# Patient Record
Sex: Male | Born: 1960
Health system: Southern US, Community
[De-identification: ages and names within clinical notes are randomized; demographics above are authoritative.]

## PROBLEM LIST (undated history)

## (undated) DIAGNOSIS — I499 Cardiac arrhythmia, unspecified: Secondary | ICD-10-CM

## (undated) DIAGNOSIS — G629 Polyneuropathy, unspecified: Secondary | ICD-10-CM

## (undated) DIAGNOSIS — G4730 Sleep apnea, unspecified: Secondary | ICD-10-CM

## (undated) DIAGNOSIS — C801 Malignant (primary) neoplasm, unspecified: Secondary | ICD-10-CM

## (undated) DIAGNOSIS — E669 Obesity, unspecified: Secondary | ICD-10-CM

## (undated) DIAGNOSIS — M1990 Unspecified osteoarthritis, unspecified site: Secondary | ICD-10-CM

## (undated) DIAGNOSIS — K429 Umbilical hernia without obstruction or gangrene: Secondary | ICD-10-CM

## (undated) DIAGNOSIS — I82409 Acute embolism and thrombosis of unspecified deep veins of unspecified lower extremity: Secondary | ICD-10-CM

## (undated) DIAGNOSIS — K633 Ulcer of intestine: Secondary | ICD-10-CM

## (undated) DIAGNOSIS — Z85038 Personal history of other malignant neoplasm of large intestine: Secondary | ICD-10-CM

## (undated) DIAGNOSIS — E785 Hyperlipidemia, unspecified: Secondary | ICD-10-CM

## (undated) DIAGNOSIS — Z95810 Presence of automatic (implantable) cardiac defibrillator: Secondary | ICD-10-CM

## (undated) DIAGNOSIS — N189 Chronic kidney disease, unspecified: Secondary | ICD-10-CM

## (undated) DIAGNOSIS — I10 Essential (primary) hypertension: Secondary | ICD-10-CM

## (undated) HISTORY — DX: Obesity, unspecified: E66.9

## (undated) HISTORY — PX: CARDIAC DEFIBRILLATOR PLACEMENT: SHX171

## (undated) HISTORY — DX: Hyperlipidemia, unspecified: E78.5

## (undated) HISTORY — DX: Personal history of other malignant neoplasm of large intestine: Z85.038

## (undated) HISTORY — DX: Cardiac arrhythmia, unspecified: I49.9

## (undated) HISTORY — DX: Essential (primary) hypertension: I10

## (undated) HISTORY — DX: Polyneuropathy, unspecified: G62.9

## (undated) HISTORY — PX: COLONOSCOPY: SHX174

## (undated) HISTORY — DX: Ulcer of intestine: K63.3

## (undated) HISTORY — DX: Chronic kidney disease, unspecified: N18.9

## (undated) HISTORY — PX: HERNIA REPAIR: SHX51

## (undated) HISTORY — PX: LAPAROSCOPIC GASTRIC SLEEVE RESECTION: SHX5895

## (undated) HISTORY — DX: Acute embolism and thrombosis of unspecified deep veins of unspecified lower extremity: I82.409

## (undated) HISTORY — DX: Sleep apnea, unspecified: G47.30

## (undated) HISTORY — PX: CHOLECYSTECTOMY: SHX55

---

## 2002-12-17 DIAGNOSIS — I472 Ventricular tachycardia, unspecified: Secondary | ICD-10-CM | POA: Insufficient documentation

## 2002-12-17 DIAGNOSIS — I499 Cardiac arrhythmia, unspecified: Secondary | ICD-10-CM

## 2002-12-17 HISTORY — DX: Cardiac arrhythmia, unspecified: I49.9

## 2002-12-27 ENCOUNTER — Inpatient Hospital Stay (HOSPITAL_COMMUNITY): Admission: EM | Admit: 2002-12-27 | Discharge: 2003-02-18 | Payer: Self-pay | Admitting: Emergency Medicine

## 2002-12-29 ENCOUNTER — Encounter: Payer: Self-pay | Admitting: Cardiology

## 2003-01-22 ENCOUNTER — Encounter (INDEPENDENT_AMBULATORY_CARE_PROVIDER_SITE_OTHER): Payer: Self-pay | Admitting: Cardiology

## 2003-02-03 ENCOUNTER — Encounter: Payer: Self-pay | Admitting: Cardiology

## 2003-02-18 ENCOUNTER — Inpatient Hospital Stay
Admission: RE | Admit: 2003-02-18 | Discharge: 2003-02-28 | Payer: Self-pay | Admitting: Physical Medicine & Rehabilitation

## 2003-02-20 ENCOUNTER — Encounter (HOSPITAL_COMMUNITY): Admission: RE | Admit: 2003-02-20 | Discharge: 2003-02-28 | Payer: Self-pay | Admitting: Pulmonary Disease

## 2003-02-27 ENCOUNTER — Ambulatory Visit (HOSPITAL_COMMUNITY)
Admission: RE | Admit: 2003-02-27 | Discharge: 2003-02-27 | Payer: Self-pay | Admitting: Physical Medicine & Rehabilitation

## 2003-03-03 ENCOUNTER — Ambulatory Visit (HOSPITAL_COMMUNITY): Admission: RE | Admit: 2003-03-03 | Discharge: 2003-03-03 | Payer: Self-pay | Admitting: Pulmonary Disease

## 2003-03-16 ENCOUNTER — Ambulatory Visit (HOSPITAL_BASED_OUTPATIENT_CLINIC_OR_DEPARTMENT_OTHER): Admission: RE | Admit: 2003-03-16 | Discharge: 2003-03-16 | Payer: Self-pay | Admitting: Pulmonary Disease

## 2003-03-16 ENCOUNTER — Encounter: Payer: Self-pay | Admitting: Internal Medicine

## 2003-12-08 ENCOUNTER — Ambulatory Visit: Payer: Self-pay | Admitting: Internal Medicine

## 2003-12-14 ENCOUNTER — Ambulatory Visit: Payer: Self-pay | Admitting: Internal Medicine

## 2004-01-06 ENCOUNTER — Ambulatory Visit: Payer: Self-pay | Admitting: Internal Medicine

## 2004-02-11 ENCOUNTER — Ambulatory Visit: Payer: Self-pay | Admitting: Internal Medicine

## 2004-03-10 ENCOUNTER — Ambulatory Visit: Payer: Self-pay | Admitting: Internal Medicine

## 2004-04-07 ENCOUNTER — Ambulatory Visit: Payer: Self-pay | Admitting: Internal Medicine

## 2004-04-12 ENCOUNTER — Ambulatory Visit: Payer: Self-pay | Admitting: Internal Medicine

## 2004-05-05 ENCOUNTER — Ambulatory Visit: Payer: Self-pay | Admitting: Internal Medicine

## 2004-05-30 ENCOUNTER — Ambulatory Visit: Payer: Self-pay | Admitting: Internal Medicine

## 2004-06-01 ENCOUNTER — Ambulatory Visit: Payer: Self-pay | Admitting: Internal Medicine

## 2004-07-04 ENCOUNTER — Ambulatory Visit: Payer: Self-pay | Admitting: Internal Medicine

## 2004-08-01 ENCOUNTER — Ambulatory Visit: Payer: Self-pay | Admitting: Internal Medicine

## 2004-08-29 ENCOUNTER — Ambulatory Visit: Payer: Self-pay | Admitting: Internal Medicine

## 2004-09-26 ENCOUNTER — Ambulatory Visit: Payer: Self-pay | Admitting: Internal Medicine

## 2004-10-10 ENCOUNTER — Ambulatory Visit: Payer: Self-pay | Admitting: Internal Medicine

## 2004-10-26 ENCOUNTER — Ambulatory Visit: Payer: Self-pay | Admitting: Internal Medicine

## 2004-11-23 ENCOUNTER — Ambulatory Visit: Payer: Self-pay | Admitting: Internal Medicine

## 2004-12-14 ENCOUNTER — Ambulatory Visit: Payer: Self-pay | Admitting: Internal Medicine

## 2004-12-26 ENCOUNTER — Ambulatory Visit: Payer: Self-pay | Admitting: Internal Medicine

## 2004-12-29 ENCOUNTER — Ambulatory Visit: Payer: Self-pay | Admitting: Internal Medicine

## 2005-01-19 ENCOUNTER — Ambulatory Visit: Payer: Self-pay | Admitting: Internal Medicine

## 2005-01-25 ENCOUNTER — Ambulatory Visit: Payer: Self-pay | Admitting: Internal Medicine

## 2005-01-26 ENCOUNTER — Ambulatory Visit: Payer: Self-pay | Admitting: Family Medicine

## 2005-02-03 ENCOUNTER — Ambulatory Visit: Payer: Self-pay | Admitting: Family Medicine

## 2005-02-16 ENCOUNTER — Ambulatory Visit: Payer: Self-pay | Admitting: Internal Medicine

## 2005-02-22 ENCOUNTER — Ambulatory Visit: Payer: Self-pay | Admitting: Internal Medicine

## 2005-03-22 ENCOUNTER — Ambulatory Visit: Payer: Self-pay | Admitting: Internal Medicine

## 2005-04-20 ENCOUNTER — Ambulatory Visit: Payer: Self-pay | Admitting: Internal Medicine

## 2005-05-02 ENCOUNTER — Ambulatory Visit: Payer: Self-pay | Admitting: Internal Medicine

## 2005-05-17 ENCOUNTER — Ambulatory Visit: Payer: Self-pay | Admitting: Internal Medicine

## 2005-06-08 ENCOUNTER — Ambulatory Visit: Payer: Self-pay | Admitting: Internal Medicine

## 2005-06-14 ENCOUNTER — Ambulatory Visit: Payer: Self-pay | Admitting: Internal Medicine

## 2005-07-12 ENCOUNTER — Ambulatory Visit: Payer: Self-pay | Admitting: Internal Medicine

## 2005-08-09 ENCOUNTER — Ambulatory Visit: Payer: Self-pay | Admitting: Internal Medicine

## 2005-09-06 ENCOUNTER — Ambulatory Visit: Payer: Self-pay | Admitting: Internal Medicine

## 2005-10-09 ENCOUNTER — Ambulatory Visit: Payer: Self-pay | Admitting: Family Medicine

## 2005-10-12 ENCOUNTER — Ambulatory Visit: Payer: Self-pay

## 2005-10-22 ENCOUNTER — Emergency Department (HOSPITAL_COMMUNITY): Admission: EM | Admit: 2005-10-22 | Discharge: 2005-10-23 | Payer: Self-pay | Admitting: Emergency Medicine

## 2005-10-23 ENCOUNTER — Encounter: Payer: Self-pay | Admitting: Internal Medicine

## 2005-10-23 ENCOUNTER — Ambulatory Visit (HOSPITAL_COMMUNITY): Admission: RE | Admit: 2005-10-23 | Discharge: 2005-10-23 | Payer: Self-pay | Admitting: Emergency Medicine

## 2005-11-08 ENCOUNTER — Ambulatory Visit: Payer: Self-pay | Admitting: Internal Medicine

## 2005-11-24 ENCOUNTER — Encounter (INDEPENDENT_AMBULATORY_CARE_PROVIDER_SITE_OTHER): Payer: Self-pay | Admitting: Specialist

## 2005-11-24 ENCOUNTER — Ambulatory Visit (HOSPITAL_COMMUNITY): Admission: RE | Admit: 2005-11-24 | Discharge: 2005-11-25 | Payer: Self-pay | Admitting: General Surgery

## 2005-12-11 ENCOUNTER — Ambulatory Visit: Payer: Self-pay | Admitting: Internal Medicine

## 2006-01-02 ENCOUNTER — Ambulatory Visit: Payer: Self-pay | Admitting: Internal Medicine

## 2006-01-17 ENCOUNTER — Ambulatory Visit: Payer: Self-pay | Admitting: Internal Medicine

## 2006-01-31 ENCOUNTER — Ambulatory Visit: Payer: Self-pay | Admitting: Internal Medicine

## 2006-03-01 ENCOUNTER — Ambulatory Visit: Payer: Self-pay | Admitting: Internal Medicine

## 2006-03-01 LAB — CONVERTED CEMR LAB
ALT: 57 units/L — ABNORMAL HIGH (ref 0–40)
AST: 39 units/L — ABNORMAL HIGH (ref 0–37)
Bilirubin, Direct: 0.1 mg/dL (ref 0.0–0.3)
Total Bilirubin: 0.9 mg/dL (ref 0.3–1.2)

## 2006-03-28 ENCOUNTER — Ambulatory Visit: Payer: Self-pay | Admitting: Internal Medicine

## 2006-04-15 ENCOUNTER — Ambulatory Visit: Payer: Self-pay | Admitting: Internal Medicine

## 2006-04-25 ENCOUNTER — Ambulatory Visit: Payer: Self-pay | Admitting: Internal Medicine

## 2006-05-23 ENCOUNTER — Ambulatory Visit: Payer: Self-pay | Admitting: Internal Medicine

## 2006-05-23 DIAGNOSIS — I4891 Unspecified atrial fibrillation: Secondary | ICD-10-CM | POA: Insufficient documentation

## 2006-05-23 LAB — CONVERTED CEMR LAB

## 2006-06-19 ENCOUNTER — Ambulatory Visit: Payer: Self-pay | Admitting: Internal Medicine

## 2006-06-19 DIAGNOSIS — I10 Essential (primary) hypertension: Secondary | ICD-10-CM | POA: Insufficient documentation

## 2006-06-19 DIAGNOSIS — Z6841 Body Mass Index (BMI) 40.0 and over, adult: Secondary | ICD-10-CM

## 2006-06-19 DIAGNOSIS — G4710 Hypersomnia, unspecified: Secondary | ICD-10-CM | POA: Insufficient documentation

## 2006-06-19 DIAGNOSIS — G4730 Sleep apnea, unspecified: Secondary | ICD-10-CM

## 2006-06-19 DIAGNOSIS — E785 Hyperlipidemia, unspecified: Secondary | ICD-10-CM | POA: Insufficient documentation

## 2006-07-10 ENCOUNTER — Ambulatory Visit: Payer: Self-pay | Admitting: Internal Medicine

## 2006-07-16 ENCOUNTER — Ambulatory Visit: Payer: Self-pay | Admitting: Internal Medicine

## 2006-07-16 LAB — CONVERTED CEMR LAB
INR: 2
Prothrombin Time: 17.5 s

## 2006-07-18 ENCOUNTER — Telehealth (INDEPENDENT_AMBULATORY_CARE_PROVIDER_SITE_OTHER): Payer: Self-pay | Admitting: *Deleted

## 2006-07-24 ENCOUNTER — Encounter: Payer: Self-pay | Admitting: Internal Medicine

## 2006-08-13 ENCOUNTER — Ambulatory Visit: Payer: Self-pay | Admitting: Internal Medicine

## 2006-08-13 LAB — CONVERTED CEMR LAB: Prothrombin Time: 18.2 s

## 2006-09-12 ENCOUNTER — Ambulatory Visit: Payer: Self-pay | Admitting: Internal Medicine

## 2006-09-26 ENCOUNTER — Ambulatory Visit: Payer: Self-pay | Admitting: Internal Medicine

## 2006-09-26 LAB — CONVERTED CEMR LAB: Prothrombin Time: 17.3 s

## 2006-10-16 ENCOUNTER — Ambulatory Visit: Payer: Self-pay | Admitting: Internal Medicine

## 2006-10-19 ENCOUNTER — Ambulatory Visit: Payer: Self-pay | Admitting: Internal Medicine

## 2006-10-19 LAB — CONVERTED CEMR LAB: INR: 1.5

## 2006-11-05 ENCOUNTER — Ambulatory Visit: Payer: Self-pay | Admitting: Internal Medicine

## 2006-11-05 LAB — CONVERTED CEMR LAB: Prothrombin Time: 17.4 s

## 2006-12-04 ENCOUNTER — Ambulatory Visit: Payer: Self-pay | Admitting: Internal Medicine

## 2006-12-05 LAB — CONVERTED CEMR LAB
INR: 2
Prothrombin Time: 17.5 s

## 2006-12-19 ENCOUNTER — Encounter: Payer: Self-pay | Admitting: Internal Medicine

## 2006-12-20 ENCOUNTER — Ambulatory Visit (HOSPITAL_COMMUNITY): Admission: RE | Admit: 2006-12-20 | Discharge: 2006-12-20 | Payer: Self-pay | Admitting: Surgery

## 2006-12-25 ENCOUNTER — Ambulatory Visit (HOSPITAL_COMMUNITY): Admission: RE | Admit: 2006-12-25 | Discharge: 2006-12-25 | Payer: Self-pay | Admitting: Surgery

## 2006-12-27 ENCOUNTER — Encounter: Admission: RE | Admit: 2006-12-27 | Discharge: 2006-12-28 | Payer: Self-pay | Admitting: Surgery

## 2007-01-01 ENCOUNTER — Ambulatory Visit: Payer: Self-pay | Admitting: Family Medicine

## 2007-01-01 ENCOUNTER — Encounter (INDEPENDENT_AMBULATORY_CARE_PROVIDER_SITE_OTHER): Payer: Self-pay | Admitting: Internal Medicine

## 2007-01-01 LAB — CONVERTED CEMR LAB: INR: 2.2

## 2007-01-02 ENCOUNTER — Ambulatory Visit: Payer: Self-pay | Admitting: Internal Medicine

## 2007-01-03 ENCOUNTER — Encounter: Payer: Self-pay | Admitting: Internal Medicine

## 2007-01-03 ENCOUNTER — Ambulatory Visit: Payer: Self-pay | Admitting: Internal Medicine

## 2007-01-03 LAB — CONVERTED CEMR LAB
Basophils Relative: 0.2 % (ref 0.0–1.0)
Eosinophils Relative: 3.7 % (ref 0.0–5.0)
HCT: 44.9 % (ref 39.0–52.0)
Hemoglobin: 15.7 g/dL (ref 13.0–17.0)
Lymphocytes Relative: 23.1 % (ref 12.0–46.0)
MCV: 87.6 fL (ref 78.0–100.0)
Monocytes Absolute: 0.7 10*3/uL (ref 0.2–0.7)
Neutro Abs: 5.9 10*3/uL (ref 1.4–7.7)
Neutrophils Relative %: 65.1 % (ref 43.0–77.0)
WBC: 9 10*3/uL (ref 4.5–10.5)

## 2007-01-04 ENCOUNTER — Encounter: Payer: Self-pay | Admitting: Internal Medicine

## 2007-01-14 ENCOUNTER — Telehealth (INDEPENDENT_AMBULATORY_CARE_PROVIDER_SITE_OTHER): Payer: Self-pay | Admitting: *Deleted

## 2007-01-15 ENCOUNTER — Ambulatory Visit: Payer: Self-pay | Admitting: Internal Medicine

## 2007-01-17 DIAGNOSIS — Z85038 Personal history of other malignant neoplasm of large intestine: Secondary | ICD-10-CM | POA: Insufficient documentation

## 2007-01-17 HISTORY — DX: Personal history of other malignant neoplasm of large intestine: Z85.038

## 2007-01-17 HISTORY — PX: COLECTOMY: SHX59

## 2007-01-21 ENCOUNTER — Encounter: Payer: Self-pay | Admitting: Internal Medicine

## 2007-01-21 ENCOUNTER — Encounter (INDEPENDENT_AMBULATORY_CARE_PROVIDER_SITE_OTHER): Payer: Self-pay | Admitting: Internal Medicine

## 2007-01-21 ENCOUNTER — Ambulatory Visit (HOSPITAL_COMMUNITY): Admission: RE | Admit: 2007-01-21 | Discharge: 2007-01-21 | Payer: Self-pay | Admitting: Internal Medicine

## 2007-01-22 ENCOUNTER — Ambulatory Visit: Payer: Self-pay | Admitting: Internal Medicine

## 2007-01-22 ENCOUNTER — Ambulatory Visit: Payer: Self-pay | Admitting: Cardiology

## 2007-01-22 LAB — CONVERTED CEMR LAB
ALT: 73 units/L — ABNORMAL HIGH (ref 0–53)
Albumin: 3.6 g/dL (ref 3.5–5.2)
Basophils Absolute: 0.2 10*3/uL — ABNORMAL HIGH (ref 0.0–0.1)
Chloride: 102 meq/L (ref 96–112)
Eosinophils Absolute: 0.3 10*3/uL (ref 0.0–0.6)
Glucose, Bld: 276 mg/dL — ABNORMAL HIGH (ref 70–99)
HCT: 46.6 % (ref 39.0–52.0)
MCHC: 34.2 g/dL (ref 30.0–36.0)
MCV: 88.7 fL (ref 78.0–100.0)
Monocytes Relative: 4.2 % (ref 3.0–11.0)
Neutrophils Relative %: 73.8 % (ref 43.0–77.0)
Platelets: 154 10*3/uL (ref 150–400)
Potassium: 3.9 meq/L (ref 3.5–5.1)
RBC: 5.25 M/uL (ref 4.22–5.81)
Sodium: 135 meq/L (ref 135–145)
Total Bilirubin: 0.9 mg/dL (ref 0.3–1.2)

## 2007-01-24 ENCOUNTER — Ambulatory Visit: Payer: Self-pay | Admitting: Internal Medicine

## 2007-01-25 ENCOUNTER — Ambulatory Visit: Payer: Self-pay | Admitting: Internal Medicine

## 2007-01-25 ENCOUNTER — Encounter: Payer: Self-pay | Admitting: Internal Medicine

## 2007-01-31 ENCOUNTER — Ambulatory Visit: Payer: Self-pay | Admitting: Internal Medicine

## 2007-01-31 LAB — CONVERTED CEMR LAB
Glucose, Urine, Semiquant: NEGATIVE
INR: 1.8
Nitrite: NEGATIVE
Protein, U semiquant: 100
Prothrombin Time: 16.3 s

## 2007-02-13 ENCOUNTER — Encounter (INDEPENDENT_AMBULATORY_CARE_PROVIDER_SITE_OTHER): Payer: Self-pay | Admitting: Surgery

## 2007-02-13 ENCOUNTER — Inpatient Hospital Stay (HOSPITAL_COMMUNITY): Admission: RE | Admit: 2007-02-13 | Discharge: 2007-02-19 | Payer: Self-pay | Admitting: Surgery

## 2007-02-13 ENCOUNTER — Encounter: Payer: Self-pay | Admitting: Internal Medicine

## 2007-02-13 ENCOUNTER — Ambulatory Visit: Payer: Self-pay | Admitting: Cardiology

## 2007-02-13 ENCOUNTER — Ambulatory Visit: Payer: Self-pay | Admitting: Oncology

## 2007-02-20 ENCOUNTER — Ambulatory Visit: Payer: Self-pay | Admitting: Oncology

## 2007-03-07 ENCOUNTER — Ambulatory Visit: Payer: Self-pay | Admitting: Internal Medicine

## 2007-03-08 LAB — CONVERTED CEMR LAB
Albumin: 3.4 g/dL — ABNORMAL LOW (ref 3.5–5.2)
Albumin: 3.6 g/dL (ref 3.5–5.2)
BUN: 19 mg/dL (ref 6–23)
Basophils Absolute: 0 10*3/uL (ref 0.0–0.1)
CO2: 27 meq/L (ref 19–32)
Calcium: 9.4 mg/dL (ref 8.4–10.5)
Calcium: 8.8 mg/dL (ref 8.4–10.5)
Chloride: 105 meq/L (ref 96–112)
Creatinine, Ser: 1.2 mg/dL (ref 0.4–1.5)
Creatinine, Ser: 1.1 mg/dL (ref 0.4–1.5)
GFR calc Af Amer: 84 mL/min
GFR calc Af Amer: 93 mL/min
GFR calc non Af Amer: 69 mL/min
GFR calc non Af Amer: 77 mL/min
Glucose, Bld: 148 mg/dL — ABNORMAL HIGH (ref 70–99)
Glucose, Bld: 203 mg/dL — ABNORMAL HIGH (ref 70–99)
HDL: 28.6 mg/dL — ABNORMAL LOW (ref >39.0)
Hemoglobin: 15.9 g/dL (ref 13.0–17.0)
Lymphocytes Relative: 19.9 % (ref 12.0–46.0)
MCHC: 34.5 g/dL (ref 30.0–36.0)
MCV: 88.1 fL (ref 78.0–100.0)
Monocytes Absolute: 0.5 10*3/uL (ref 0.2–0.7)
Monocytes Relative: 6.4 % (ref 3.0–11.0)
Neutro Abs: 5.9 10*3/uL (ref 1.4–7.7)
Phosphorus: 1.9 mg/dL — ABNORMAL LOW (ref 2.3–4.6)
Sodium: 135 meq/L (ref 135–145)
TSH: 1.28 microintl units/mL (ref 0.35–5.50)
Total Bilirubin: 0.8 mg/dL (ref 0.3–1.2)
Triglycerides: 242 mg/dL (ref 0–149)

## 2007-03-11 LAB — CONVERTED CEMR LAB
Albumin: 3.6 g/dL (ref 3.5–5.2)
Alkaline Phosphatase: 69 units/L (ref 39–117)
Basophils Relative: 0.4 % (ref 0.0–1.0)
CO2: 27 meq/L (ref 19–32)
Calcium: 8.8 mg/dL (ref 8.4–10.5)
Chloride: 102 meq/L (ref 96–112)
Cholesterol: 204 mg/dL (ref 0–200)
Creatinine, Ser: 1.1 mg/dL (ref 0.4–1.5)
Direct LDL: 125.9 mg/dL
Eosinophils Absolute: 0.2 10*3/uL (ref 0.0–0.6)
Glucose, Bld: 203 mg/dL — ABNORMAL HIGH (ref 70–99)
HDL: 28.6 mg/dL — ABNORMAL LOW (ref >39.0)
Lymphocytes Relative: 19.9 % (ref 12.0–46.0)
MCV: 88.1 fL (ref 78.0–100.0)
Monocytes Relative: 6.4 % (ref 3.0–11.0)
Neutro Abs: 5.9 10*3/uL (ref 1.4–7.7)
Platelets: 193 10*3/uL (ref 150–400)
RBC: 5.22 M/uL (ref 4.22–5.81)
TSH: 1.28 microintl units/mL (ref 0.35–5.50)
Triglycerides: 242 mg/dL (ref 0–149)
VLDL: 48 mg/dL — ABNORMAL HIGH (ref 0–40)

## 2007-03-19 ENCOUNTER — Encounter: Payer: Self-pay | Admitting: Internal Medicine

## 2007-03-21 ENCOUNTER — Ambulatory Visit (HOSPITAL_COMMUNITY): Admission: RE | Admit: 2007-03-21 | Discharge: 2007-03-21 | Payer: Self-pay | Admitting: Surgery

## 2007-03-21 ENCOUNTER — Telehealth (INDEPENDENT_AMBULATORY_CARE_PROVIDER_SITE_OTHER): Payer: Self-pay | Admitting: *Deleted

## 2007-03-21 ENCOUNTER — Encounter: Payer: Self-pay | Admitting: Internal Medicine

## 2007-03-25 ENCOUNTER — Encounter: Payer: Self-pay | Admitting: Internal Medicine

## 2007-04-02 LAB — PROTIME-INR
INR: 2.1 (ref 2.00–3.50)
Protime: 25.2 Seconds — ABNORMAL HIGH (ref 10.6–13.4)

## 2007-04-04 ENCOUNTER — Ambulatory Visit: Payer: Self-pay | Admitting: Oncology

## 2007-04-11 ENCOUNTER — Ambulatory Visit (HOSPITAL_COMMUNITY): Admission: RE | Admit: 2007-04-11 | Discharge: 2007-04-11 | Payer: Self-pay | Admitting: Oncology

## 2007-04-16 ENCOUNTER — Encounter: Payer: Self-pay | Admitting: Internal Medicine

## 2007-04-18 LAB — CBC WITH DIFFERENTIAL/PLATELET
BASO%: 0.7 % (ref 0.0–2.0)
LYMPH%: 18.1 % (ref 14.0–48.0)
MCHC: 32.8 g/dL (ref 32.0–35.9)
MONO#: 1 10*3/uL — ABNORMAL HIGH (ref 0.1–0.9)
Platelets: 208 10*3/uL (ref 145–400)
RBC: 5.4 10*6/uL (ref 4.20–5.71)
WBC: 10.5 10*3/uL — ABNORMAL HIGH (ref 4.0–10.0)
lymph#: 1.9 10*3/uL (ref 0.9–3.3)

## 2007-04-18 LAB — PROTIME-INR: Protime: 26.4 Seconds — ABNORMAL HIGH (ref 10.6–13.4)

## 2007-04-19 ENCOUNTER — Ambulatory Visit: Payer: Self-pay | Admitting: Oncology

## 2007-04-25 LAB — PROTIME-INR (CHCC SATELLITE): Protime: 26.4 Seconds — ABNORMAL HIGH (ref 10.6–13.4)

## 2007-04-30 ENCOUNTER — Encounter: Payer: Self-pay | Admitting: Internal Medicine

## 2007-04-30 LAB — COMPREHENSIVE METABOLIC PANEL
Alkaline Phosphatase: 70 U/L (ref 39–117)
BUN: 18 mg/dL (ref 6–23)
Creatinine, Ser: 1.19 mg/dL (ref 0.40–1.50)
Glucose, Bld: 193 mg/dL — ABNORMAL HIGH (ref 70–99)
Total Bilirubin: 0.5 mg/dL (ref 0.3–1.2)

## 2007-04-30 LAB — CBC WITH DIFFERENTIAL/PLATELET
Basophils Absolute: 0 10*3/uL (ref 0.0–0.1)
Eosinophils Absolute: 0.1 10*3/uL (ref 0.0–0.5)
HGB: 15.5 g/dL (ref 13.0–17.1)
LYMPH%: 22.3 % (ref 14.0–48.0)
MCV: 83.3 fL (ref 81.6–98.0)
MONO%: 10.2 % (ref 0.0–13.0)
NEUT#: 6.1 10*3/uL (ref 1.5–6.5)
NEUT%: 65.5 % (ref 40.0–75.0)
Platelets: 156 10*3/uL (ref 145–400)
RBC: 5.44 10*6/uL (ref 4.20–5.71)

## 2007-05-14 ENCOUNTER — Encounter: Payer: Self-pay | Admitting: Internal Medicine

## 2007-05-14 LAB — CBC WITH DIFFERENTIAL (CANCER CENTER ONLY)
BASO%: 0.4 % (ref 0.0–2.0)
EOS%: 2.8 % (ref 0.0–7.0)
MCH: 27.3 pg — ABNORMAL LOW (ref 28.0–33.4)
MCHC: 32.5 g/dL (ref 32.0–35.9)
MONO%: 8.1 % (ref 0.0–13.0)
NEUT#: 4.3 10*3/uL (ref 1.5–6.5)
Platelets: 112 10*3/uL — ABNORMAL LOW (ref 145–400)
RDW: 15.5 % — ABNORMAL HIGH (ref 10.5–14.6)

## 2007-05-14 LAB — COMPREHENSIVE METABOLIC PANEL
Albumin: 3.7 g/dL (ref 3.5–5.2)
BUN: 14 mg/dL (ref 6–23)
Calcium: 8.6 mg/dL (ref 8.4–10.5)
Chloride: 99 mEq/L (ref 96–112)
Glucose, Bld: 309 mg/dL — ABNORMAL HIGH (ref 70–99)
Potassium: 4.4 mEq/L (ref 3.5–5.3)
Total Protein: 6.9 g/dL (ref 6.0–8.3)

## 2007-05-14 LAB — PROTIME-INR (CHCC SATELLITE): Protime: 24 Seconds — ABNORMAL HIGH (ref 10.6–13.4)

## 2007-05-17 ENCOUNTER — Ambulatory Visit: Payer: Self-pay | Admitting: Internal Medicine

## 2007-05-23 ENCOUNTER — Ambulatory Visit: Payer: Self-pay | Admitting: Oncology

## 2007-05-27 ENCOUNTER — Encounter: Payer: Self-pay | Admitting: Internal Medicine

## 2007-05-27 LAB — COMPREHENSIVE METABOLIC PANEL
ALT: 42 U/L (ref 0–53)
Albumin: 3.9 g/dL (ref 3.5–5.2)
CO2: 24 mEq/L (ref 19–32)
Calcium: 9.1 mg/dL (ref 8.4–10.5)
Chloride: 98 mEq/L (ref 96–112)
Potassium: 4.4 mEq/L (ref 3.5–5.3)
Sodium: 135 mEq/L (ref 135–145)
Total Protein: 6.9 g/dL (ref 6.0–8.3)

## 2007-05-27 LAB — CBC WITH DIFFERENTIAL/PLATELET
BASO%: 0.3 % (ref 0.0–2.0)
Basophils Absolute: 0 10*3/uL (ref 0.0–0.1)
EOS%: 2 % (ref 0.0–7.0)
MCH: 29.1 pg (ref 28.0–33.4)
MCHC: 34.3 g/dL (ref 32.0–35.9)
MCV: 84.9 fL (ref 81.6–98.0)
MONO%: 10.8 % (ref 0.0–13.0)
NEUT%: 65 % (ref 40.0–75.0)
RDW: 19.1 % — ABNORMAL HIGH (ref 11.2–14.6)
lymph#: 1.8 10*3/uL (ref 0.9–3.3)

## 2007-05-27 LAB — PROTIME-INR

## 2007-05-30 ENCOUNTER — Ambulatory Visit: Payer: Self-pay | Admitting: Oncology

## 2007-06-17 ENCOUNTER — Encounter: Payer: Self-pay | Admitting: Internal Medicine

## 2007-06-17 LAB — COMPREHENSIVE METABOLIC PANEL
ALT: 34 U/L (ref 0–53)
AST: 20 U/L (ref 0–37)
CO2: 22 mEq/L (ref 19–32)
Calcium: 8.9 mg/dL (ref 8.4–10.5)
Chloride: 101 mEq/L (ref 96–112)
Sodium: 136 mEq/L (ref 135–145)
Total Bilirubin: 0.5 mg/dL (ref 0.3–1.2)
Total Protein: 7.1 g/dL (ref 6.0–8.3)

## 2007-06-17 LAB — PROTIME-INR

## 2007-06-17 LAB — CBC WITH DIFFERENTIAL/PLATELET
Basophils Absolute: 0.1 10*3/uL (ref 0.0–0.1)
HCT: 44.6 % (ref 38.7–49.9)
HGB: 15.3 g/dL (ref 13.0–17.1)
MONO#: 0.7 10*3/uL (ref 0.1–0.9)
NEUT#: 5.2 10*3/uL (ref 1.5–6.5)
NEUT%: 67.9 % (ref 40.0–75.0)
WBC: 7.7 10*3/uL (ref 4.0–10.0)
lymph#: 1.6 10*3/uL (ref 0.9–3.3)

## 2007-07-02 ENCOUNTER — Encounter: Payer: Self-pay | Admitting: Internal Medicine

## 2007-07-02 LAB — COMPREHENSIVE METABOLIC PANEL
Alkaline Phosphatase: 72 U/L (ref 39–117)
Creatinine, Ser: 1.05 mg/dL (ref 0.40–1.50)
Glucose, Bld: 265 mg/dL — ABNORMAL HIGH (ref 70–99)
Sodium: 136 mEq/L (ref 135–145)
Total Bilirubin: 0.5 mg/dL (ref 0.3–1.2)
Total Protein: 6.6 g/dL (ref 6.0–8.3)

## 2007-07-02 LAB — CBC WITH DIFFERENTIAL (CANCER CENTER ONLY)
BASO%: 0.9 % (ref 0.0–2.0)
Eosinophils Absolute: 0.2 10*3/uL (ref 0.0–0.5)
HCT: 44.1 % (ref 38.7–49.9)
HGB: 15.3 g/dL (ref 13.0–17.1)
LYMPH#: 1.8 10*3/uL (ref 0.9–3.3)
LYMPH%: 28.4 % (ref 14.0–48.0)
MCV: 89 fL (ref 82–98)
MONO#: 0.7 10*3/uL (ref 0.1–0.9)
NEUT%: 57.4 % (ref 40.0–80.0)
RBC: 4.97 10*6/uL (ref 4.20–5.70)
RDW: 18 % — ABNORMAL HIGH (ref 10.5–14.6)
WBC: 6.2 10*3/uL (ref 4.0–10.0)

## 2007-07-02 LAB — PROTIME-INR (CHCC SATELLITE): INR: 2.9 (ref 2.0–3.5)

## 2007-07-11 ENCOUNTER — Ambulatory Visit: Payer: Self-pay | Admitting: Oncology

## 2007-07-15 ENCOUNTER — Encounter: Payer: Self-pay | Admitting: Internal Medicine

## 2007-07-15 LAB — CBC WITH DIFFERENTIAL/PLATELET
Basophils Absolute: 0 10*3/uL (ref 0.0–0.1)
Eosinophils Absolute: 0.2 10*3/uL (ref 0.0–0.5)
HGB: 15.5 g/dL (ref 13.0–17.1)
MCV: 91.2 fL (ref 81.6–98.0)
MONO#: 0.8 10*3/uL (ref 0.1–0.9)
MONO%: 11.8 % (ref 0.0–13.0)
NEUT#: 4.3 10*3/uL (ref 1.5–6.5)
RDW: 20.7 % — ABNORMAL HIGH (ref 11.2–14.6)
WBC: 6.9 10*3/uL (ref 4.0–10.0)
lymph#: 1.6 10*3/uL (ref 0.9–3.3)

## 2007-07-15 LAB — PROTIME-INR
INR: 2.2 (ref 2.00–3.50)
Protime: 26.4 Seconds — ABNORMAL HIGH (ref 10.6–13.4)

## 2007-07-15 LAB — COMPREHENSIVE METABOLIC PANEL
ALT: 47 U/L (ref 0–53)
AST: 30 U/L (ref 0–37)
Albumin: 3.7 g/dL (ref 3.5–5.2)
Alkaline Phosphatase: 74 U/L (ref 39–117)
Calcium: 9.1 mg/dL (ref 8.4–10.5)
Chloride: 102 mEq/L (ref 96–112)
Potassium: 4.2 mEq/L (ref 3.5–5.3)
Sodium: 135 mEq/L (ref 135–145)

## 2007-07-17 DIAGNOSIS — I82409 Acute embolism and thrombosis of unspecified deep veins of unspecified lower extremity: Secondary | ICD-10-CM

## 2007-07-17 HISTORY — DX: Acute embolism and thrombosis of unspecified deep veins of unspecified lower extremity: I82.409

## 2007-07-17 LAB — HM DIABETES EYE EXAM

## 2007-07-23 LAB — CBC WITH DIFFERENTIAL (CANCER CENTER ONLY)
BASO#: 0.1 10*3/uL (ref 0.0–0.2)
EOS%: 2.8 % (ref 0.0–7.0)
HCT: 46.5 % (ref 38.7–49.9)
HGB: 15.5 g/dL (ref 13.0–17.1)
LYMPH#: 2.2 10*3/uL (ref 0.9–3.3)
LYMPH%: 39 % (ref 14.0–48.0)
MCHC: 33.3 g/dL (ref 32.0–35.9)
MCV: 93 fL (ref 82–98)
MONO#: 0.7 10*3/uL (ref 0.1–0.9)
NEUT%: 43.9 % (ref 40.0–80.0)

## 2007-08-05 ENCOUNTER — Encounter: Payer: Self-pay | Admitting: Internal Medicine

## 2007-08-05 LAB — CBC WITH DIFFERENTIAL/PLATELET
Basophils Absolute: 0 10*3/uL (ref 0.0–0.1)
Eosinophils Absolute: 0.2 10*3/uL (ref 0.0–0.5)
HGB: 15.7 g/dL (ref 13.0–17.1)
MCV: 93.3 fL (ref 81.6–98.0)
MONO#: 0.9 10*3/uL (ref 0.1–0.9)
MONO%: 10.7 % (ref 0.0–13.0)
NEUT#: 5.8 10*3/uL (ref 1.5–6.5)
RDW: 18.2 % — ABNORMAL HIGH (ref 11.2–14.6)
WBC: 8.8 10*3/uL (ref 4.0–10.0)

## 2007-08-05 LAB — COMPREHENSIVE METABOLIC PANEL
ALT: 33 U/L (ref 0–53)
AST: 21 U/L (ref 0–37)
Albumin: 3.9 g/dL (ref 3.5–5.2)
Alkaline Phosphatase: 83 U/L (ref 39–117)
BUN: 18 mg/dL (ref 6–23)
Calcium: 9.2 mg/dL (ref 8.4–10.5)
Chloride: 100 mEq/L (ref 96–112)
Potassium: 4.8 mEq/L (ref 3.5–5.3)
Sodium: 134 mEq/L — ABNORMAL LOW (ref 135–145)
Total Protein: 7.7 g/dL (ref 6.0–8.3)

## 2007-08-05 LAB — PROTIME-INR: INR: 1.8 — ABNORMAL LOW (ref 2.00–3.50)

## 2007-08-06 ENCOUNTER — Ambulatory Visit: Payer: Self-pay | Admitting: Internal Medicine

## 2007-08-08 ENCOUNTER — Ambulatory Visit: Payer: Self-pay | Admitting: Internal Medicine

## 2007-08-08 ENCOUNTER — Ambulatory Visit: Payer: Self-pay | Admitting: Family Medicine

## 2007-08-08 ENCOUNTER — Ambulatory Visit: Payer: Self-pay | Admitting: Oncology

## 2007-08-08 ENCOUNTER — Observation Stay (HOSPITAL_COMMUNITY): Admission: AD | Admit: 2007-08-08 | Discharge: 2007-08-09 | Payer: Self-pay | Admitting: Internal Medicine

## 2007-08-08 DIAGNOSIS — I82409 Acute embolism and thrombosis of unspecified deep veins of unspecified lower extremity: Secondary | ICD-10-CM | POA: Insufficient documentation

## 2007-08-09 ENCOUNTER — Encounter: Payer: Self-pay | Admitting: Internal Medicine

## 2007-08-09 ENCOUNTER — Telehealth: Payer: Self-pay | Admitting: Internal Medicine

## 2007-08-12 ENCOUNTER — Telehealth: Payer: Self-pay | Admitting: Internal Medicine

## 2007-08-14 ENCOUNTER — Ambulatory Visit: Payer: Self-pay | Admitting: Internal Medicine

## 2007-08-20 LAB — CBC WITH DIFFERENTIAL (CANCER CENTER ONLY)
BASO%: 1.2 % (ref 0.0–2.0)
Eosinophils Absolute: 0.2 10*3/uL (ref 0.0–0.5)
LYMPH%: 23.6 % (ref 14.0–48.0)
MCH: 32 pg (ref 28.0–33.4)
MCV: 95 fL (ref 82–98)
MONO%: 10.1 % (ref 0.0–13.0)
NEUT#: 5.9 10*3/uL (ref 1.5–6.5)
Platelets: 149 10*3/uL (ref 145–400)
RBC: 4.89 10*6/uL (ref 4.20–5.70)
RDW: 14.8 % — ABNORMAL HIGH (ref 10.5–14.6)
WBC: 9.4 10*3/uL (ref 4.0–10.0)

## 2007-08-26 ENCOUNTER — Ambulatory Visit: Payer: Self-pay | Admitting: Internal Medicine

## 2007-09-02 ENCOUNTER — Ambulatory Visit: Payer: Self-pay | Admitting: Oncology

## 2007-09-02 ENCOUNTER — Telehealth: Payer: Self-pay | Admitting: Internal Medicine

## 2007-09-03 LAB — CBC WITH DIFFERENTIAL (CANCER CENTER ONLY)
BASO#: 0.1 10e3/uL (ref 0.0–0.2)
BASO%: 0.7 % (ref 0.0–2.0)
EOS%: 3.3 % (ref 0.0–7.0)
Eosinophils Absolute: 0.3 10e3/uL (ref 0.0–0.5)
HCT: 45.4 % (ref 38.7–49.9)
HGB: 15.5 g/dL (ref 13.0–17.1)
LYMPH#: 2.4 10e3/uL (ref 0.9–3.3)
LYMPH%: 26.7 % (ref 14.0–48.0)
MCH: 32.3 pg (ref 28.0–33.4)
MCHC: 34.1 g/dL (ref 32.0–35.9)
MCV: 95 fL (ref 82–98)
MONO#: 0.7 10e3/uL (ref 0.1–0.9)
MONO%: 7.9 % (ref 0.0–13.0)
NEUT#: 5.4 10e3/uL (ref 1.5–6.5)
NEUT%: 61.4 % (ref 40.0–80.0)
Platelets: 168 10e3/uL (ref 145–400)
RBC: 4.79 10e6/uL (ref 4.20–5.70)
RDW: 14.3 % (ref 10.5–14.6)
WBC: 8.8 10e3/uL (ref 4.0–10.0)

## 2007-09-04 ENCOUNTER — Ambulatory Visit: Payer: Self-pay | Admitting: Internal Medicine

## 2007-09-10 ENCOUNTER — Encounter: Payer: Self-pay | Admitting: Internal Medicine

## 2007-09-10 LAB — COMPREHENSIVE METABOLIC PANEL
Albumin: 4.2 g/dL (ref 3.5–5.2)
Alkaline Phosphatase: 70 U/L (ref 39–117)
BUN: 20 mg/dL (ref 6–23)
CO2: 22 mEq/L (ref 19–32)
Glucose, Bld: 314 mg/dL — ABNORMAL HIGH (ref 70–99)
Potassium: 4.3 mEq/L (ref 3.5–5.3)
Total Bilirubin: 0.5 mg/dL (ref 0.3–1.2)

## 2007-09-10 LAB — CBC WITH DIFFERENTIAL/PLATELET
Basophils Absolute: 0 10*3/uL (ref 0.0–0.1)
Eosinophils Absolute: 0.2 10*3/uL (ref 0.0–0.5)
HCT: 42.8 % (ref 38.7–49.9)
HGB: 14.9 g/dL (ref 13.0–17.1)
LYMPH%: 27.4 % (ref 14.0–48.0)
MCV: 98.1 fL — ABNORMAL HIGH (ref 81.6–98.0)
MONO%: 7.4 % (ref 0.0–13.0)
NEUT#: 4.7 10*3/uL (ref 1.5–6.5)
Platelets: 236 10*3/uL (ref 145–400)

## 2007-09-17 LAB — CBC WITH DIFFERENTIAL (CANCER CENTER ONLY)
BASO%: 0.9 % (ref 0.0–2.0)
Eosinophils Absolute: 0.3 10*3/uL (ref 0.0–0.5)
LYMPH#: 2 10*3/uL (ref 0.9–3.3)
MONO#: 0.6 10*3/uL (ref 0.1–0.9)
NEUT#: 4.9 10*3/uL (ref 1.5–6.5)
Platelets: 119 10*3/uL — ABNORMAL LOW (ref 145–400)
RBC: 4.63 10*6/uL (ref 4.20–5.70)
RDW: 13.5 % (ref 10.5–14.6)
WBC: 7.8 10*3/uL (ref 4.0–10.0)

## 2007-09-27 ENCOUNTER — Encounter: Payer: Self-pay | Admitting: Internal Medicine

## 2007-09-27 ENCOUNTER — Telehealth: Payer: Self-pay | Admitting: Internal Medicine

## 2007-10-01 ENCOUNTER — Encounter: Payer: Self-pay | Admitting: Internal Medicine

## 2007-10-09 ENCOUNTER — Ambulatory Visit: Payer: Self-pay | Admitting: Internal Medicine

## 2007-10-14 ENCOUNTER — Ambulatory Visit (HOSPITAL_COMMUNITY): Admission: RE | Admit: 2007-10-14 | Discharge: 2007-10-14 | Payer: Self-pay | Admitting: Oncology

## 2007-10-14 ENCOUNTER — Encounter: Payer: Self-pay | Admitting: Internal Medicine

## 2007-10-14 LAB — COMPREHENSIVE METABOLIC PANEL
Alkaline Phosphatase: 57 U/L (ref 39–117)
BUN: 15 mg/dL (ref 6–23)
Creatinine, Ser: 1.02 mg/dL (ref 0.40–1.50)
Glucose, Bld: 217 mg/dL — ABNORMAL HIGH (ref 70–99)
Sodium: 134 mEq/L — ABNORMAL LOW (ref 135–145)
Total Bilirubin: 0.6 mg/dL (ref 0.3–1.2)

## 2007-10-16 ENCOUNTER — Ambulatory Visit: Payer: Self-pay | Admitting: Internal Medicine

## 2007-10-21 ENCOUNTER — Ambulatory Visit: Payer: Self-pay | Admitting: Oncology

## 2007-10-21 ENCOUNTER — Encounter: Payer: Self-pay | Admitting: Internal Medicine

## 2007-10-23 ENCOUNTER — Ambulatory Visit: Payer: Self-pay | Admitting: Internal Medicine

## 2007-11-05 ENCOUNTER — Ambulatory Visit: Payer: Self-pay | Admitting: Internal Medicine

## 2007-11-06 ENCOUNTER — Ambulatory Visit: Payer: Self-pay | Admitting: Internal Medicine

## 2007-11-06 LAB — CONVERTED CEMR LAB: Prothrombin Time: 19.7 s

## 2007-11-22 ENCOUNTER — Ambulatory Visit: Payer: Self-pay | Admitting: Oncology

## 2007-12-18 ENCOUNTER — Ambulatory Visit: Payer: Self-pay | Admitting: Internal Medicine

## 2007-12-20 LAB — CONVERTED CEMR LAB
Albumin: 3.8 g/dL (ref 3.5–5.2)
BUN: 16 mg/dL (ref 6–23)
Basophils Absolute: 0 10*3/uL (ref 0.0–0.1)
Basophils Relative: 0.4 % (ref 0.0–3.0)
CO2: 29 meq/L (ref 19–32)
Chloride: 103 meq/L (ref 96–112)
Creatinine, Ser: 1.1 mg/dL (ref 0.4–1.5)
Eosinophils Relative: 3.4 % (ref 0.0–5.0)
Glucose, Bld: 117 mg/dL — ABNORMAL HIGH (ref 70–99)
Hemoglobin: 17.1 g/dL — ABNORMAL HIGH (ref 13.0–17.0)
Lymphocytes Relative: 21.8 % (ref 12.0–46.0)
MCHC: 35 g/dL (ref 30.0–36.0)
Neutro Abs: 7.2 10*3/uL (ref 1.4–7.7)
Neutrophils Relative %: 66.5 % (ref 43.0–77.0)
RBC: 5.31 M/uL (ref 4.22–5.81)
TSH: 1.81 microintl units/mL (ref 0.35–5.50)
WBC: 10.7 10*3/uL — ABNORMAL HIGH (ref 4.5–10.5)

## 2008-01-01 ENCOUNTER — Ambulatory Visit: Payer: Self-pay | Admitting: Internal Medicine

## 2008-01-01 LAB — CONVERTED CEMR LAB
INR: 3.4
Prothrombin Time: 22.2 s

## 2008-01-02 ENCOUNTER — Ambulatory Visit: Payer: Self-pay | Admitting: Oncology

## 2008-01-06 ENCOUNTER — Encounter: Payer: Self-pay | Admitting: Internal Medicine

## 2008-01-15 ENCOUNTER — Ambulatory Visit: Payer: Self-pay | Admitting: Internal Medicine

## 2008-01-15 LAB — CONVERTED CEMR LAB
INR: 2.2
Prothrombin Time: 18.3 s

## 2008-01-20 ENCOUNTER — Encounter: Payer: Self-pay | Admitting: Internal Medicine

## 2008-01-20 ENCOUNTER — Telehealth: Payer: Self-pay | Admitting: Internal Medicine

## 2008-01-31 ENCOUNTER — Telehealth: Payer: Self-pay | Admitting: Internal Medicine

## 2008-02-05 ENCOUNTER — Ambulatory Visit: Payer: Self-pay | Admitting: Internal Medicine

## 2008-02-14 ENCOUNTER — Ambulatory Visit: Payer: Self-pay | Admitting: Internal Medicine

## 2008-02-14 LAB — CONVERTED CEMR LAB: Prothrombin Time: 16.1 s

## 2008-02-18 ENCOUNTER — Encounter: Payer: Self-pay | Admitting: Internal Medicine

## 2008-02-28 ENCOUNTER — Ambulatory Visit: Payer: Self-pay | Admitting: Internal Medicine

## 2008-02-28 LAB — CONVERTED CEMR LAB: Prothrombin Time: 19 s

## 2008-03-27 ENCOUNTER — Ambulatory Visit: Payer: Self-pay | Admitting: Internal Medicine

## 2008-03-27 LAB — CONVERTED CEMR LAB: INR: 2.2

## 2008-04-02 ENCOUNTER — Ambulatory Visit: Payer: Self-pay | Admitting: Oncology

## 2008-04-06 ENCOUNTER — Encounter: Payer: Self-pay | Admitting: Internal Medicine

## 2008-04-29 ENCOUNTER — Ambulatory Visit: Payer: Self-pay | Admitting: Internal Medicine

## 2008-04-29 LAB — CONVERTED CEMR LAB
INR: 2.9
Prothrombin Time: 20.6 s

## 2008-05-21 ENCOUNTER — Encounter (INDEPENDENT_AMBULATORY_CARE_PROVIDER_SITE_OTHER): Payer: Self-pay | Admitting: Internal Medicine

## 2008-06-01 ENCOUNTER — Ambulatory Visit: Payer: Self-pay | Admitting: Internal Medicine

## 2008-06-01 LAB — CONVERTED CEMR LAB
INR: 2.1
Prothrombin Time: 18 s

## 2008-06-02 LAB — CONVERTED CEMR LAB
Albumin: 3.7 g/dL (ref 3.5–5.2)
CO2: 29 meq/L (ref 19–32)
Chloride: 107 meq/L (ref 96–112)
Eosinophils Relative: 4 % (ref 0.0–5.0)
HCT: 49.1 % (ref 39.0–52.0)
Hgb A1c MFr Bld: 7 % — ABNORMAL HIGH (ref 4.6–6.5)
MCHC: 34.3 g/dL (ref 30.0–36.0)
MCV: 91.9 fL (ref 78.0–100.0)
Phosphorus: 2.9 mg/dL (ref 2.3–4.6)
Platelets: 208 10*3/uL (ref 150.0–400.0)
Potassium: 4.3 meq/L (ref 3.5–5.1)
WBC: 8.5 10*3/uL (ref 4.5–10.5)

## 2008-06-29 ENCOUNTER — Ambulatory Visit: Payer: Self-pay | Admitting: Internal Medicine

## 2008-07-27 ENCOUNTER — Ambulatory Visit: Payer: Self-pay | Admitting: Internal Medicine

## 2008-07-27 LAB — CONVERTED CEMR LAB: Prothrombin Time: 20.8 s

## 2008-08-24 ENCOUNTER — Ambulatory Visit: Payer: Self-pay | Admitting: Internal Medicine

## 2008-08-24 LAB — CONVERTED CEMR LAB
INR: 3.8
Prothrombin Time: 23.5 s

## 2008-09-03 ENCOUNTER — Ambulatory Visit: Payer: Self-pay | Admitting: Internal Medicine

## 2008-09-03 LAB — CONVERTED CEMR LAB: Prothrombin Time: 22.7 s

## 2008-09-08 ENCOUNTER — Telehealth: Payer: Self-pay | Admitting: Internal Medicine

## 2008-09-24 ENCOUNTER — Ambulatory Visit: Payer: Self-pay | Admitting: Oncology

## 2008-09-25 ENCOUNTER — Ambulatory Visit: Payer: Self-pay | Admitting: Internal Medicine

## 2008-09-25 LAB — CONVERTED CEMR LAB: Hgb A1c MFr Bld: 6.6 % — ABNORMAL HIGH (ref 4.6–6.5)

## 2008-09-28 ENCOUNTER — Ambulatory Visit (HOSPITAL_COMMUNITY): Admission: RE | Admit: 2008-09-28 | Discharge: 2008-09-28 | Payer: Self-pay | Admitting: Oncology

## 2008-09-28 LAB — CBC WITH DIFFERENTIAL/PLATELET
BASO%: 0.3 % (ref 0.0–2.0)
Eosinophils Absolute: 0.4 10*3/uL (ref 0.0–0.5)
HCT: 48.9 % (ref 38.4–49.9)
LYMPH%: 22.8 % (ref 14.0–49.0)
MCHC: 34.5 g/dL (ref 32.0–36.0)
MCV: 91.2 fL (ref 79.3–98.0)
MONO#: 0.6 10*3/uL (ref 0.1–0.9)
NEUT%: 66.7 % (ref 39.0–75.0)
Platelets: 176 10*3/uL (ref 140–400)
WBC: 9.2 10*3/uL (ref 4.0–10.3)

## 2008-09-28 LAB — COMPREHENSIVE METABOLIC PANEL
ALT: 53 U/L (ref 0–53)
AST: 30 U/L (ref 0–37)
Albumin: 3.9 g/dL (ref 3.5–5.2)
BUN: 16 mg/dL (ref 6–23)
Calcium: 9 mg/dL (ref 8.4–10.5)
Chloride: 104 mEq/L (ref 96–112)
Potassium: 4.4 mEq/L (ref 3.5–5.3)

## 2008-09-29 ENCOUNTER — Ambulatory Visit: Payer: Self-pay | Admitting: Internal Medicine

## 2008-09-29 ENCOUNTER — Telehealth: Payer: Self-pay | Admitting: Internal Medicine

## 2008-10-01 ENCOUNTER — Encounter: Payer: Self-pay | Admitting: Internal Medicine

## 2008-10-05 ENCOUNTER — Encounter: Payer: Self-pay | Admitting: Internal Medicine

## 2008-10-09 ENCOUNTER — Telehealth: Payer: Self-pay | Admitting: Internal Medicine

## 2008-10-14 ENCOUNTER — Telehealth: Payer: Self-pay | Admitting: Internal Medicine

## 2008-10-14 ENCOUNTER — Ambulatory Visit: Payer: Self-pay | Admitting: Internal Medicine

## 2008-10-14 ENCOUNTER — Ambulatory Visit (HOSPITAL_COMMUNITY): Admission: RE | Admit: 2008-10-14 | Discharge: 2008-10-14 | Payer: Self-pay | Admitting: Oncology

## 2008-10-14 LAB — CONVERTED CEMR LAB: INR: 3.8

## 2008-10-22 ENCOUNTER — Encounter: Payer: Self-pay | Admitting: Internal Medicine

## 2008-10-22 ENCOUNTER — Telehealth (INDEPENDENT_AMBULATORY_CARE_PROVIDER_SITE_OTHER): Payer: Self-pay | Admitting: *Deleted

## 2008-10-23 ENCOUNTER — Encounter: Payer: Self-pay | Admitting: Internal Medicine

## 2008-10-30 ENCOUNTER — Telehealth: Payer: Self-pay | Admitting: Internal Medicine

## 2008-11-03 ENCOUNTER — Telehealth: Payer: Self-pay | Admitting: Internal Medicine

## 2008-11-11 ENCOUNTER — Ambulatory Visit: Payer: Self-pay | Admitting: Internal Medicine

## 2008-11-11 LAB — CONVERTED CEMR LAB: Prothrombin Time: 18.8 s

## 2008-12-14 ENCOUNTER — Ambulatory Visit: Payer: Self-pay | Admitting: Internal Medicine

## 2008-12-14 LAB — CONVERTED CEMR LAB
INR: 2.6
Prothrombin Time: 19.5 s

## 2008-12-29 ENCOUNTER — Ambulatory Visit: Payer: Self-pay | Admitting: Internal Medicine

## 2008-12-29 ENCOUNTER — Encounter: Payer: Self-pay | Admitting: Internal Medicine

## 2009-01-11 ENCOUNTER — Encounter: Payer: Self-pay | Admitting: Internal Medicine

## 2009-01-21 ENCOUNTER — Telehealth: Payer: Self-pay | Admitting: Family Medicine

## 2009-02-02 ENCOUNTER — Ambulatory Visit: Payer: Self-pay | Admitting: Internal Medicine

## 2009-02-02 LAB — CONVERTED CEMR LAB: INR: 4.8

## 2009-02-04 ENCOUNTER — Ambulatory Visit: Payer: Self-pay | Admitting: Internal Medicine

## 2009-02-18 ENCOUNTER — Ambulatory Visit: Payer: Self-pay | Admitting: Internal Medicine

## 2009-02-18 LAB — CONVERTED CEMR LAB: INR: 2.8

## 2009-03-18 ENCOUNTER — Ambulatory Visit: Payer: Self-pay | Admitting: Internal Medicine

## 2009-03-18 LAB — CONVERTED CEMR LAB: INR: 2.2

## 2009-03-30 ENCOUNTER — Ambulatory Visit: Payer: Self-pay | Admitting: Internal Medicine

## 2009-04-13 ENCOUNTER — Encounter: Payer: Self-pay | Admitting: Internal Medicine

## 2009-04-15 ENCOUNTER — Ambulatory Visit: Payer: Self-pay | Admitting: Internal Medicine

## 2009-04-15 LAB — CONVERTED CEMR LAB: INR: 2.7

## 2009-04-21 ENCOUNTER — Ambulatory Visit: Payer: Self-pay | Admitting: Oncology

## 2009-04-23 ENCOUNTER — Encounter (INDEPENDENT_AMBULATORY_CARE_PROVIDER_SITE_OTHER): Payer: Self-pay | Admitting: *Deleted

## 2009-04-23 ENCOUNTER — Encounter: Payer: Self-pay | Admitting: Internal Medicine

## 2009-04-23 LAB — CEA: CEA: 1.1 ng/mL (ref 0.0–5.0)

## 2009-05-13 ENCOUNTER — Ambulatory Visit: Payer: Self-pay | Admitting: Internal Medicine

## 2009-05-13 LAB — CONVERTED CEMR LAB
INR: 2.1
Prothrombin Time: 24.6 s

## 2009-05-27 ENCOUNTER — Ambulatory Visit: Payer: Self-pay | Admitting: Internal Medicine

## 2009-05-31 ENCOUNTER — Ambulatory Visit: Payer: Self-pay | Admitting: Internal Medicine

## 2009-05-31 ENCOUNTER — Encounter (INDEPENDENT_AMBULATORY_CARE_PROVIDER_SITE_OTHER): Payer: Self-pay | Admitting: *Deleted

## 2009-05-31 DIAGNOSIS — Z85038 Personal history of other malignant neoplasm of large intestine: Secondary | ICD-10-CM | POA: Insufficient documentation

## 2009-06-01 ENCOUNTER — Encounter (INDEPENDENT_AMBULATORY_CARE_PROVIDER_SITE_OTHER): Payer: Self-pay | Admitting: *Deleted

## 2009-06-07 ENCOUNTER — Telehealth (INDEPENDENT_AMBULATORY_CARE_PROVIDER_SITE_OTHER): Payer: Self-pay | Admitting: *Deleted

## 2009-06-10 ENCOUNTER — Ambulatory Visit: Payer: Self-pay | Admitting: Internal Medicine

## 2009-06-29 ENCOUNTER — Ambulatory Visit (HOSPITAL_COMMUNITY): Admission: RE | Admit: 2009-06-29 | Discharge: 2009-06-29 | Payer: Self-pay | Admitting: Internal Medicine

## 2009-06-29 ENCOUNTER — Ambulatory Visit: Payer: Self-pay | Admitting: Internal Medicine

## 2009-07-01 ENCOUNTER — Encounter: Payer: Self-pay | Admitting: Internal Medicine

## 2009-07-01 LAB — HM COLONOSCOPY

## 2009-07-07 ENCOUNTER — Ambulatory Visit: Payer: Self-pay | Admitting: Internal Medicine

## 2009-07-08 ENCOUNTER — Ambulatory Visit: Payer: Self-pay | Admitting: Internal Medicine

## 2009-07-15 IMAGING — CT CT ABDOMEN W/ CM
2 of 5 series · 16 of 46 positions shown, 18 images · IV contrast (omnipaque)
Comparison: 02/27/03.

CLINICAL DATA: Colon cancer on colonoscopy.  
 ABDOMEN CT WITH CONTRAST:
TECHNIQUE: Multidetector CT imaging of the abdomen was performed following the standard protocol during bolus administration of intravenous contrast.
 Contrast:  150 cc Omnipaque 350.
TECHNIQUE: Multidetector CT imaging of the pelvis was performed following the standard protocol during bolus administration of intravenous contrast.

[Series 2: abd_pel_xxl 5.0 b10f st · axial · 0.98mm/px · z∈[-596,-122]mm · 13 of 107 slices shown, 15 images]
[im 6/107  soft-tissue]
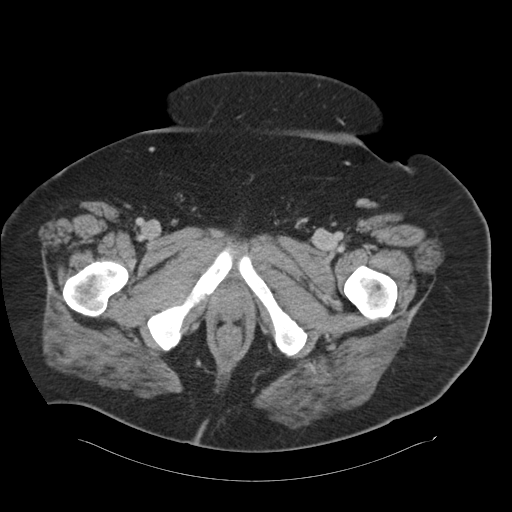
[im 6/107  bone]
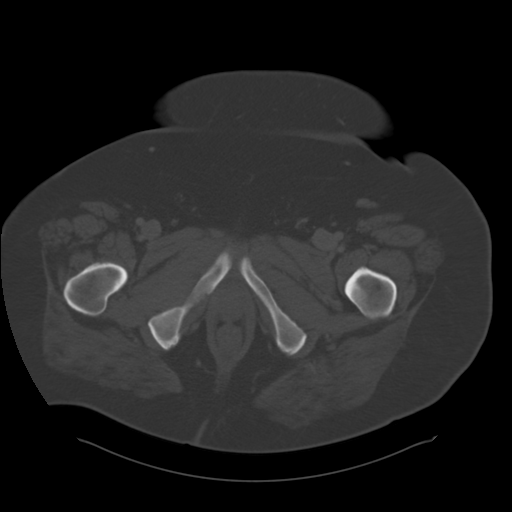
[im 17/107  soft-tissue]
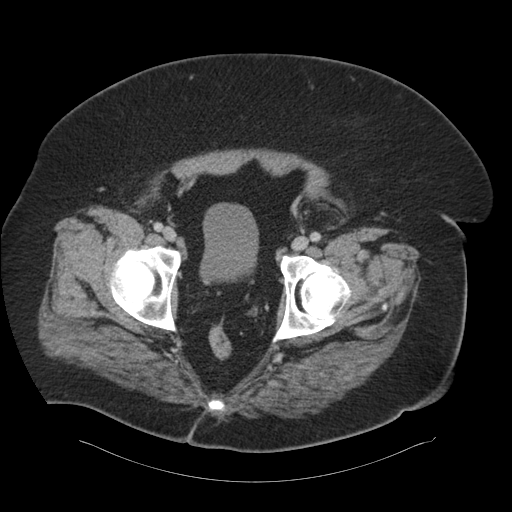
[im 23/107  soft-tissue]
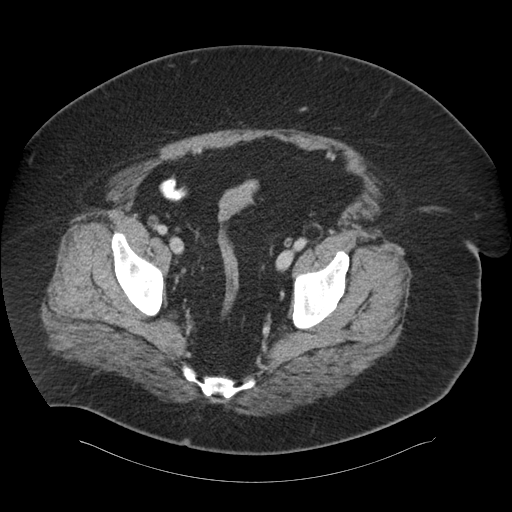
[im 28/107  soft-tissue]
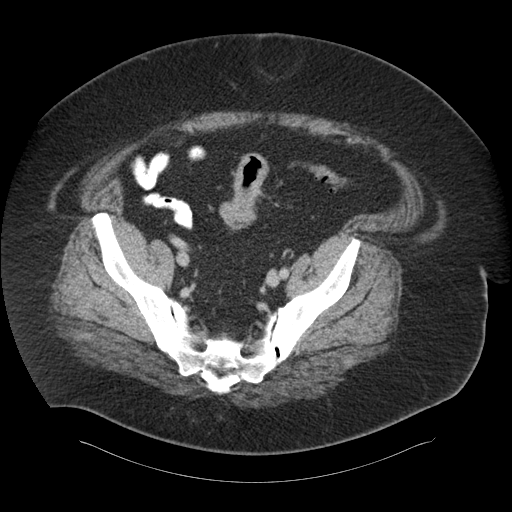
[im 40/107  soft-tissue]
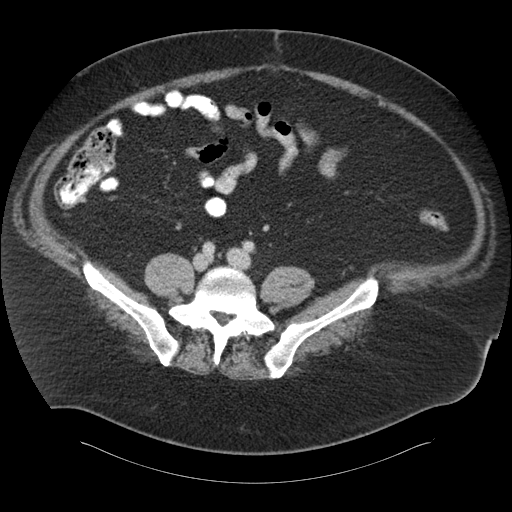
[im 45/107  soft-tissue]
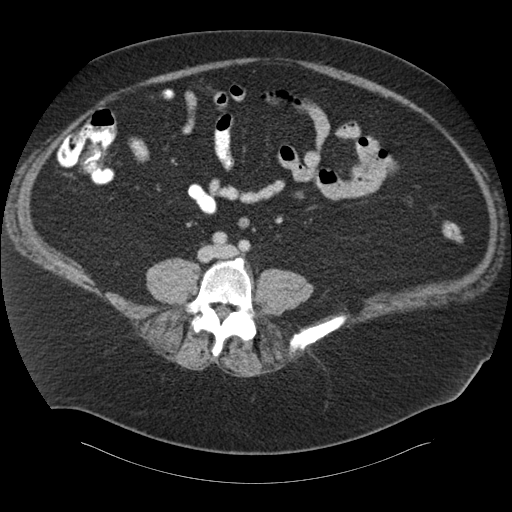
[im 56/107  soft-tissue]
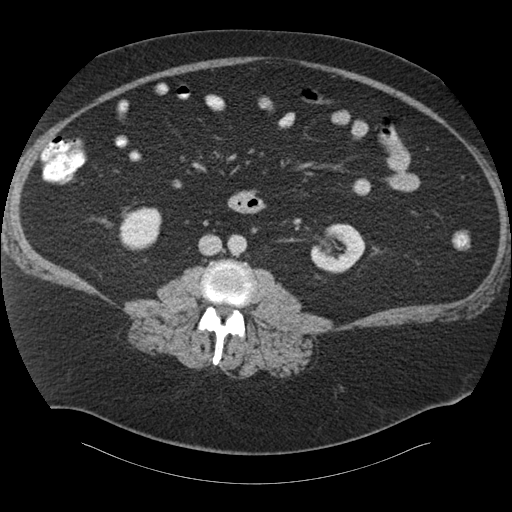
[im 62/107  soft-tissue]
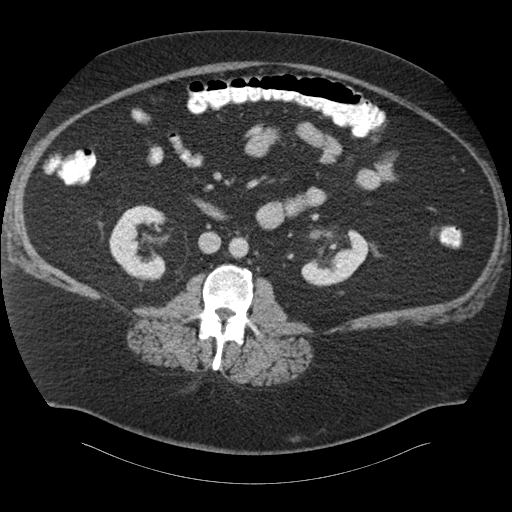
[im 67/107  soft-tissue]
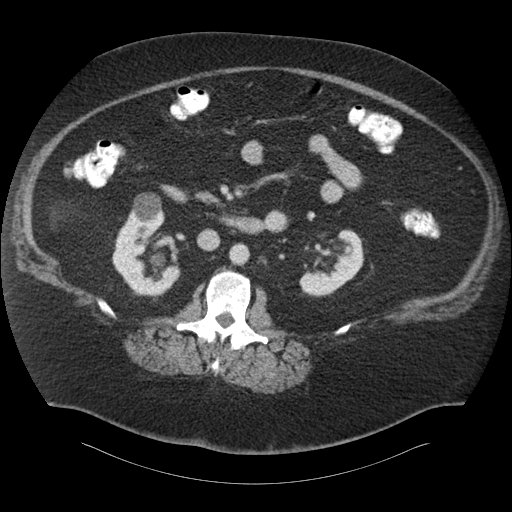
[im 67/107  bone]
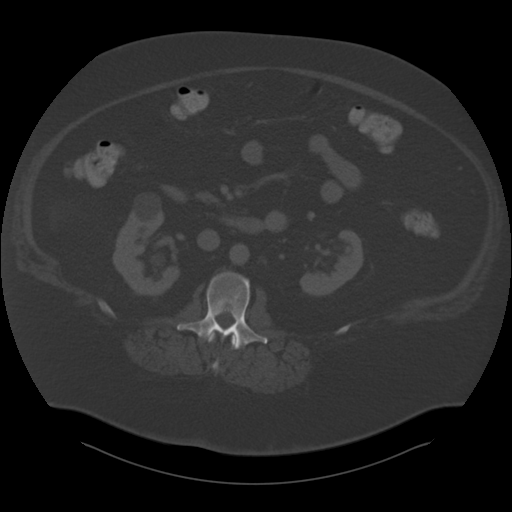
[im 79/107  soft-tissue]
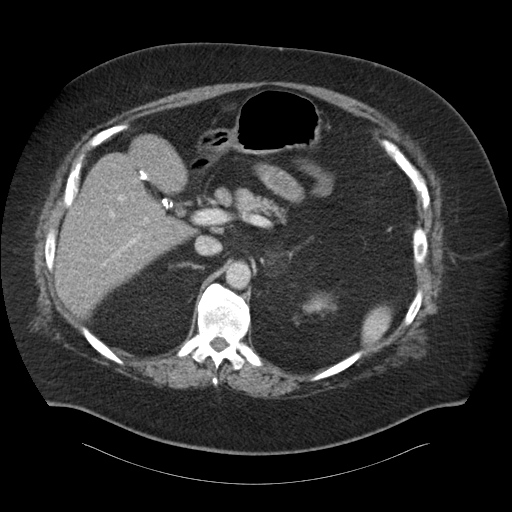
[im 84/107  soft-tissue]
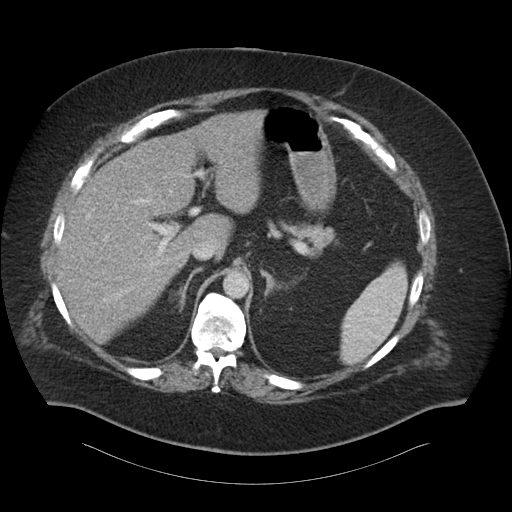
[im 90/107  soft-tissue]
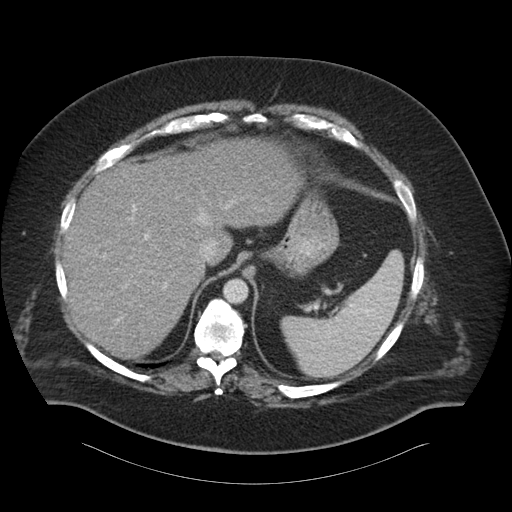
[im 101/107  soft-tissue]
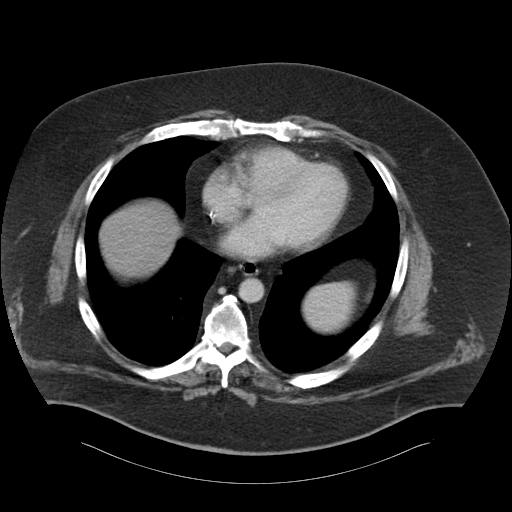

[Series 602: <mpr thick range> · coronal · 1.08mm/px · 3 of 122 slices shown]
[im 41/122  soft-tissue]
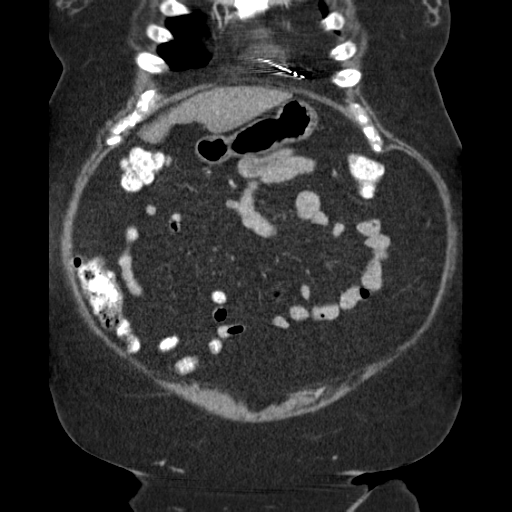
[im 54/122  soft-tissue]
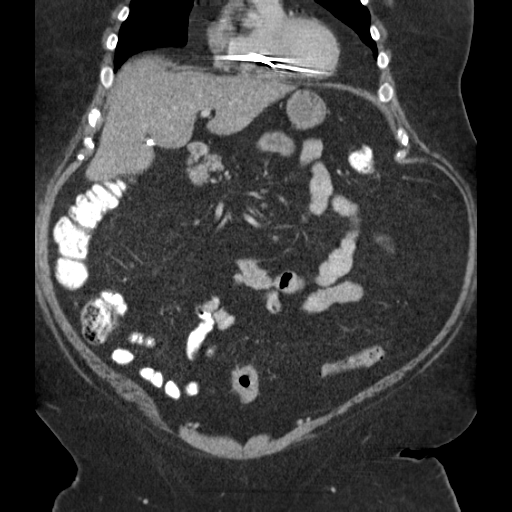
[im 68/122  soft-tissue]
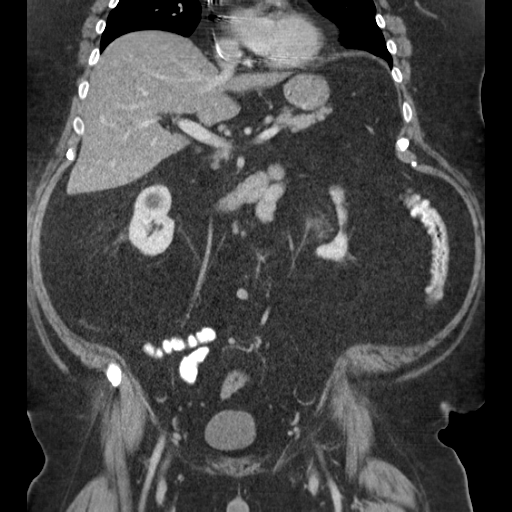

[16 of 46 positions shown; findings below may reference images not displayed]

FINDINGS: The lung bases are clear. 
 There is a small, hyperattenuating lesion arising from the dome of the liver measuring 7.9 mm. 
 The patient is status-post cholecystectomy.   
 The spleen is negative. 
 Both adrenal glands are negative. 
 The pancreas is negative. 
 The right adrenal gland is negative.  There is a fat density lesion arising from the left adrenal gland measuring 2.8 cm.  This is consistent with a benign adrenal myelolipoma.
 There is a small cyst arising from the lower pole of the left kidney.  
 There is a 2.9 cm cyst arising from the right kidney, image 41.  
 Smaller hypodensity within the upper pole cortex measures 10 mm, image 38.  
 The pancreas is negative.  
 There is no enlarged retroperitoneal or small bowel mesenteric lymph nodes.  
 The appendix is normal. 
 No enlarged mesenteric lymph nodes are identified.
IMPRESSION: 1.  No specific evidence for upper abdominal metastatic disease. 
 2.  7.9 mm indeterminate hyperattenuating lesion arises from the anterior segment of the right lobe of liver just below the liver capsule, image 25.  A more definitive evaluation could be performed with a dedicated MR of the liver.  
 3.  Benign appearing left adrenal myelolipoma.  
 PELVIS CT WITH CONTRAST:
FINDINGS: There is a midline umbilical hernia measuring 6.4 cm, image 74. 
 Just below the aortic bifurcation, there is a single lymph nodes which measures 10.5 mm x 10.1 mm, image 62. 
 No enlarged iliac or inguinal lymph nodes are identified.  The urinary bladder is negative.    The pelvic bowel loops are unremarkable.  
 There is no free fluid, or abnormal fluid collections. 
 Fat containing inguinal hernias are noted.  
 Review of the bone windows shows no lytic or sclerotic lesions.  There is multilevel lumbar spondylosis.
IMPRESSION: 1.  Single lymph node is identified within the fat just anterior to the left common iliac artery just below the level of the aortic bifurcation.  This is borderline enlarged measuring up to 10.5 mm.  If further evaluation is clinically warranted, a PET CT may be helpful to assess for hypermetabolism and determine the likelihood of lymph node metastasis.  
 2.  Umbilical hernia containing fat.

## 2009-07-16 ENCOUNTER — Encounter: Payer: Self-pay | Admitting: Cardiovascular Disease

## 2009-07-16 ENCOUNTER — Ambulatory Visit: Payer: Self-pay | Admitting: Internal Medicine

## 2009-07-16 DIAGNOSIS — Z95810 Presence of automatic (implantable) cardiac defibrillator: Secondary | ICD-10-CM | POA: Insufficient documentation

## 2009-07-16 LAB — CONVERTED CEMR LAB
CO2: 23 meq/L (ref 19–32)
Calcium: 9.1 mg/dL (ref 8.4–10.5)
Eosinophils Absolute: 0.3 10*3/uL (ref 0.0–0.7)
Eosinophils Relative: 3 % (ref 0–5)
HCT: 49.8 % (ref 39.0–52.0)
Hemoglobin: 16.2 g/dL (ref 13.0–17.0)
Lymphs Abs: 1.9 10*3/uL (ref 0.7–4.0)
MCV: 94 fL (ref 78.0–100.0)
Monocytes Absolute: 0.6 10*3/uL (ref 0.1–1.0)
Monocytes Relative: 8 % (ref 3–12)
Neutrophils Relative %: 66 % (ref 43–77)
RBC: 5.3 M/uL (ref 4.22–5.81)
Sodium: 139 meq/L (ref 135–145)
WBC: 8.3 10*3/uL (ref 4.0–10.5)
aPTT: 41 s — ABNORMAL HIGH (ref 24–37)

## 2009-07-21 ENCOUNTER — Ambulatory Visit: Payer: Self-pay | Admitting: Internal Medicine

## 2009-07-21 ENCOUNTER — Ambulatory Visit (HOSPITAL_COMMUNITY): Admission: RE | Admit: 2009-07-21 | Discharge: 2009-07-21 | Payer: Self-pay | Admitting: Internal Medicine

## 2009-07-23 ENCOUNTER — Ambulatory Visit: Payer: Self-pay | Admitting: Internal Medicine

## 2009-07-26 LAB — CONVERTED CEMR LAB
ALT: 54 units/L — ABNORMAL HIGH (ref 0–53)
AST: 30 units/L (ref 0–37)
Albumin: 3.7 g/dL (ref 3.5–5.2)
Cholesterol: 185 mg/dL (ref 0–200)
Microalb Creat Ratio: 3.6 mg/g (ref 0.0–30.0)
TSH: 1.3 microintl units/mL (ref 0.35–5.50)
Total CHOL/HDL Ratio: 5
Triglycerides: 210 mg/dL — ABNORMAL HIGH (ref 0.0–149.0)

## 2009-08-02 ENCOUNTER — Telehealth: Payer: Self-pay | Admitting: Internal Medicine

## 2009-08-02 ENCOUNTER — Ambulatory Visit: Payer: Self-pay | Admitting: Internal Medicine

## 2009-08-02 DIAGNOSIS — R3915 Urgency of urination: Secondary | ICD-10-CM

## 2009-08-02 LAB — CONVERTED CEMR LAB
Blood in Urine, dipstick: NEGATIVE
Glucose, Urine, Semiquant: >=1000
Nitrite: NEGATIVE
Protein, U semiquant: 30

## 2009-08-04 ENCOUNTER — Telehealth: Payer: Self-pay | Admitting: Internal Medicine

## 2009-08-10 ENCOUNTER — Ambulatory Visit: Payer: Self-pay | Admitting: Internal Medicine

## 2009-08-10 LAB — CONVERTED CEMR LAB
INR: 2.5
Prothrombin Time: 29.9 s

## 2009-08-13 ENCOUNTER — Encounter (INDEPENDENT_AMBULATORY_CARE_PROVIDER_SITE_OTHER): Payer: Self-pay | Admitting: *Deleted

## 2009-08-17 ENCOUNTER — Telehealth: Payer: Self-pay | Admitting: Internal Medicine

## 2009-08-19 ENCOUNTER — Encounter: Payer: Self-pay | Admitting: Internal Medicine

## 2009-08-19 ENCOUNTER — Ambulatory Visit: Payer: Self-pay

## 2009-09-07 ENCOUNTER — Ambulatory Visit: Payer: Self-pay | Admitting: Internal Medicine

## 2009-09-07 LAB — CONVERTED CEMR LAB
INR: 2.2
Prothrombin Time: 26.5 s

## 2009-09-22 ENCOUNTER — Ambulatory Visit: Payer: Self-pay | Admitting: Internal Medicine

## 2009-09-22 LAB — CONVERTED CEMR LAB: INR: 1.8

## 2009-09-30 ENCOUNTER — Ambulatory Visit: Payer: Self-pay | Admitting: Internal Medicine

## 2009-09-30 ENCOUNTER — Ambulatory Visit: Payer: Self-pay | Admitting: Oncology

## 2009-10-04 ENCOUNTER — Encounter: Payer: Self-pay | Admitting: Internal Medicine

## 2009-10-04 ENCOUNTER — Ambulatory Visit (HOSPITAL_COMMUNITY): Admission: RE | Admit: 2009-10-04 | Discharge: 2009-10-04 | Payer: Self-pay | Admitting: Oncology

## 2009-10-07 ENCOUNTER — Encounter: Payer: Self-pay | Admitting: Internal Medicine

## 2009-10-07 LAB — BASIC METABOLIC PANEL
BUN: 18 mg/dL (ref 6–23)
CO2: 26 mEq/L (ref 19–32)
Chloride: 102 mEq/L (ref 96–112)
Glucose, Bld: 212 mg/dL — ABNORMAL HIGH (ref 70–99)
Potassium: 4.3 mEq/L (ref 3.5–5.3)

## 2009-10-14 ENCOUNTER — Ambulatory Visit: Payer: Self-pay | Admitting: Internal Medicine

## 2009-11-02 ENCOUNTER — Ambulatory Visit: Payer: Self-pay | Admitting: Internal Medicine

## 2009-11-10 ENCOUNTER — Encounter: Payer: Self-pay | Admitting: Internal Medicine

## 2009-11-11 ENCOUNTER — Ambulatory Visit: Payer: Self-pay | Admitting: Internal Medicine

## 2009-11-11 LAB — CONVERTED CEMR LAB: INR: 3.5

## 2009-11-25 ENCOUNTER — Ambulatory Visit: Payer: Self-pay | Admitting: Internal Medicine

## 2009-11-25 LAB — CONVERTED CEMR LAB: Prothrombin Time: 37.8 s

## 2009-12-17 ENCOUNTER — Ambulatory Visit: Payer: Self-pay | Admitting: Internal Medicine

## 2009-12-17 LAB — CONVERTED CEMR LAB
INR: 3.3
Prothrombin Time: 39.7 s

## 2010-01-24 ENCOUNTER — Ambulatory Visit: Admit: 2010-01-24 | Payer: Self-pay | Admitting: Internal Medicine

## 2010-02-03 ENCOUNTER — Encounter: Payer: Self-pay | Admitting: Internal Medicine

## 2010-02-04 ENCOUNTER — Ambulatory Visit: Admit: 2010-02-04 | Payer: Self-pay | Admitting: Internal Medicine

## 2010-02-05 ENCOUNTER — Encounter: Payer: Self-pay | Admitting: Pulmonary Disease

## 2010-02-07 ENCOUNTER — Ambulatory Visit
Admission: RE | Admit: 2010-02-07 | Discharge: 2010-02-07 | Payer: Self-pay | Source: Home / Self Care | Attending: Internal Medicine | Admitting: Internal Medicine

## 2010-02-07 LAB — CONVERTED CEMR LAB: INR: 1.9

## 2010-02-11 ENCOUNTER — Ambulatory Visit
Admission: RE | Admit: 2010-02-11 | Discharge: 2010-02-11 | Payer: Self-pay | Source: Home / Self Care | Attending: Internal Medicine | Admitting: Internal Medicine

## 2010-02-13 LAB — CONVERTED CEMR LAB
INR: 2.9
Potassium: 4.8 meq/L (ref 3.5–5.1)
Prothrombin Time: 20.6 s

## 2010-02-15 NOTE — Letter (Signed)
Summary: New Patient letter  [facility]  [address] [place], [place] [address]   Phone: [phone]  Fax: [phone]       04/23/2009 MRN: [id number]  Craig Lindsey [address] [place], [place]  [address]  Dear Mr. CASS,  Welcome to the Gastroenterology Division at [facility].    You are scheduled to see Dr. Leone Payor on 05/31/2009 at 10:30AM on the 3rd floor at [facility], [address].  We ask that you try to arrive at our office 15 minutes prior to your appointment time to allow for check-in.  We would like you to complete the enclosed self-administered evaluation form prior to your visit and bring it with you on the day of your appointment.  We will review it with you.  Also, please bring a complete list of all your medications or, if you prefer, bring the medication bottles and we will list them.  Please bring your insurance card so that we may make a copy of it.  If your insurance requires a referral to see a specialist, please bring your referral form from your primary care physician.  Co-payments are due at the time of your visit and may be paid by cash, check or credit card.     Your office visit will consist of a consult with your physician (includes a physical exam), any laboratory testing he/she may order, scheduling of any necessary diagnostic testing (e.g. x-ray, ultrasound, CT-scan), and scheduling of a procedure (e.g. Endoscopy, Colonoscopy) if required.  Please allow enough time on your schedule to allow for any/all of these possibilities.    If you cannot keep your appointment, please call [phone] to cancel or reschedule prior to your appointment date.  This allows [place] the opportunity to schedule an appointment for another patient in need of care.  If you do not cancel or reschedule by 5 p.m. the business day prior to your appointment date, you will be charged a $50.00 late cancellation/no-show fee.    Thank you for choosing [facility] for your medical needs.  We appreciate the opportunity to care for you.  Please visit [place] at our website  to learn more about our practice.                     Sincerely,                                                             The Gastroenterology Division

## 2010-02-15 NOTE — Procedures (Signed)
Summary: wound check  425.4  Medications Added METOPROLOL SUCCINATE 100 MG XR24H-TAB (METOPROLOL SUCCINATE) 1 1/2 by mouth  daily      Allergies Added: NKDA  Current Medications (verified): 1)  Spironolactone 50 Mg Tabs (Spironolactone) .... Take 1 Tablet By Mouth Twice A Day 2)  Potassium Chloride Crys Cr 20 Meq Tbcr (Potassium Chloride Crys Cr) .... Take 1 Tablet By Mouth Once A Day 3)  Diltiazem Hcl Coated Beads 240 Mg Cp24 (Diltiazem Hcl Coated Beads) .... Take 1 Capsule By Mouth Once A Day 4)  Lasix 40 Mg Tabs (Furosemide) .... Take 1 Tablet By Mouth Once A Day 5)  Metformin Hcl 1000 Mg  Tabs (Metformin Hcl) .Marland Kitchen.. 1 Tab By Mouth Two Times A Day 6)  Amiodarone Hcl 200 Mg  Tabs (Amiodarone Hcl) .Marland Kitchen.. 1 Daily By Mouth 7)  Multivitamins   Tabs (Multiple Vitamin) .Marland Kitchen.. 1 Daily By Mouth 8)  Glipizide 10 Mg Xr24h-Tab (Glipizide) .Marland Kitchen.. 1 Tab Daily 9)  Coumadin 4 Mg Tabs (Warfarin Sodium) .... Takes 6mg  Tues and Sat 10)  Coumadin 4 Mg Tabs (Warfarin Sodium) .... 8 Mg  Monday,wed, Thurs,friday,sunday 11)  Metoprolol Succinate 100 Mg Xr24h-Tab (Metoprolol Succinate) .Marland Kitchen.. 1 1/2 By Mouth  Daily  Allergies (verified): No Known Drug Allergies   ICD Specifications Following MD:  Lewayne Bunting, MD     ICD Vendor:  [employer]     ICD Model Number:  [phone]     ICD Serial Number:  [id number] ICD DOI:  01/29/2003     ICD Implanting MD:  Lewayne Bunting, MD  Lead 1:    Location: RA     DOI: 01/29/2003     Model #: [id number]     Serial #: [id number]     Status: capped Lead 2:    Location: RV     DOI: 01/29/2003     Model #: [id number]     Serial #: [id number]     Status: active  Indications::  VF ARREST   ICD Follow Up Remote Check?  No Battery Voltage:  3.23 V     Charge Time:  8.6 seconds     Underlying rhythm:  SR ICD Dependent:  No       ICD Device Measurements Right Ventricle:  Amplitude: 11.6 mV, Impedance: 703 ohms, Threshold: 0.375 V at 0.4 msec Shock Impedance: 59/86 ohms   Episodes Coumadin:   Yes Shock:  0     ATP:  0     Nonsustained:  0     Ventricular Pacing:  <0.1%  Brady Parameters Mode DDI     Lower Rate Limit:  50     Upper Rate Limit 120 PAV 300     Sensed AV Delay:  300  Tachy Zones VF:  200     VT:  240     VT1:  176     Next Cardiology Appt Due:  11/16/2009 Tech Comments:  No parameter changes.  Device functiion normal.  Wound well healed.  ROV 3 months with Dr. Ladona Ridgel in [place]. Altha Harm, LPN  August 19, 2009 12:46 PM

## 2010-02-15 NOTE — Letter (Signed)
Summary: [facility] Instructions  [facility]  [address] [place], [place] [address]   Phone: [phone]  Fax: [phone]       JUSTO HENGEL    05/03/1960    MRN: [id number]      Procedure Day Dorna Bloom: Jake Shark, 06/29/09     Arrival Time: 7:30AM       Procedure Time: 8:30AM    Location of Procedure:                    _X_  [facility] ( Outpatient Registration)                     PREPARATION FOR COLONOSCOPY WITH MOVIPREP   Starting 5 days prior to your procedure 06/24/09 do not eat nuts, seeds, popcorn, corn, beans, peas,  salads, or any raw vegetables.  Do not take any fiber supplements (e.g. Metamucil, Citrucel, and Benefiber).  THE DAY BEFORE YOUR PROCEDURE         MONDAY, 06/28/09  1.  Drink clear liquids the entire day-NO SOLID FOOD  2.  Do not drink anything colored red or purple.  Avoid juices with pulp.  No orange juice.  3.  Drink at least 64 oz. (8 glasses) of fluid/clear liquids during the day to prevent dehydration and help the prep work efficiently.  CLEAR LIQUIDS INCLUDE: Water Jello Ice Popsicles Tea (sugar ok, no milk/cream) Powdered fruit flavored drinks Coffee (sugar ok, no milk/cream) Gatorade Juice: apple, white grape, white cranberry  Lemonade Clear bullion, consomm, broth Carbonated beverages (any kind) Strained chicken noodle soup Hard Candy                             4.  In the morning, mix first dose of MoviPrep solution:    Empty 1 Pouch A and 1 Pouch B into the disposable container    Add lukewarm drinking water to the top line of the container. Mix to dissolve    Refrigerate (mixed solution should be used within 24 hrs)  5.  Begin drinking the prep at 5:00 p.m. The MoviPrep container is divided by 4 marks.   Every 15 minutes drink the solution down to the next mark (approximately 8 oz) until the full liter is complete.   6.  Follow completed prep with 16 oz of clear liquid of your choice (Nothing red or  purple).  Continue to drink clear liquids until bedtime.    7.  Before going to bed, mix second dose of MoviPrep solution:    Empty 1 Pouch A and 1 Pouch B into the disposable container    Add lukewarm drinking water to the top line of the container. Mix to dissolve    Refrigerate  THE DAY OF YOUR PROCEDURE      TUESDAY, 06/29/09  Beginning at 3:30a.m. (5 hours before procedure):         1. Every 15 minutes, drink the solution down to the next mark (approx 8 oz) until the full liter is complete.  2. Follow completed prep with 16 oz. of clear liquid of your choice.    3. You may drink clear liquids until 4:30 AM (4 HOURS BEFORE PROCEDURE).  MEDICATION INSTRUCTIONS  Unless otherwise instructed, you should take regular prescription medications with a small sip of water   as early as possible the morning of your procedure.  Diabetic patients - see separate instructions.  Stop  taking Coumadin on  _ _  (5 days before procedure). You will be contaced by our office prior to your procedure for directions on holding your Coumadin/Warfarin.  If you do not hear from our office 1 week prior to your scheduled procedure, please call [phone] to discuss.        OTHER INSTRUCTIONS  You will need a responsible adult at least 50 years of age to accompany you and drive you home.   This person must remain in the waiting room during your procedure.  Wear loose fitting clothing that is easily removed.  Leave jewelry and other valuables at home.  However, you may wish to bring a book to read or  an iPod/MP3 player to listen to music as you wait for your procedure to start.  Remove all body piercing jewelry and leave at home.  Total time from sign-in until discharge is approximately 2-3 hours.  You should go home directly after your procedure and rest.  You can resume normal activities the  day after your procedure.          The day of your procedure you should not:   Drive    Make legal decisions   Operate machinery   Drink alcohol   Return to work  You will receive specific instructions about eating, activities and medications before you leave.    The above instructions have been reviewed and explained to me by   _______________________    I fully understand and can verbalize these instructions _____________________________ Date _________

## 2010-02-15 NOTE — Procedures (Signed)
Summary: Instructions for procedure/[facility] (out pt)  Instructions for procedure/[facility] (out pt)   Imported By: Sherian Rein 06/02/2009 11:50:54  _____________________________________________________________________  External Attachment:    Type:   Image     Comment:   External Document

## 2010-02-15 NOTE — Op Note (Signed)
Summary: Operative Report  Operative Report   Imported By: Frazier Butt Chriscoe 08/09/2009 09:21:30  _____________________________________________________________________  External Attachment:    Type:   Image     Comment:   External Document

## 2010-02-15 NOTE — Assessment & Plan Note (Signed)
Summary: ? UTI/ 5:00   Vital Signs:  Patient profile:   50 year old male Weight:      420 pounds Temp:     98.3 degrees F oral Pulse rate:   88 / minute Pulse rhythm:   regular BP sitting:   140 / 76  (left arm) Cuff size:   large  Vitals Entered By: Lowella Petties CMA (August 02, 2009 5:43 PM) CC: Pressure after urinating   History of Present Illness: Started yesterday evening  Post void urgency and pressure No dysuria No hematuria Temp up to 100.4 this AM  No penile discharge No scrotal swelling Now walking more --wonders about scrotal irritation   Allergies: No Known Drug Allergies  Past History:  Past medical, surgical, family and social histories (including risk factors) reviewed for relevance to current acute and chronic problems.  Past Medical History: Reviewed history from 05/31/2009 and no changes required. Diabetes mellitus, type II Hyperlipidemia Hypertension Ventricular tachycardia--12/04-----------------------------------------Dr Revonda Standard fibrillation Sleep apnea Obesity Hemorrhoids 1/09 St III Colon cancer-------------------------------------------------------Dr Verdis Frederickson, D. Newman Oxaliplatin neuropathy DVT RLE 7/09, Tx Arixtra Complex L renal cyst  Past Surgical History: Reviewed history from 05/31/2009 and no changes required. CHOLECYSTECTOMY, LAPAROSCOPIC, HX OF (ICD-V45.79) UMBILICAL HERNIORRHAPHY, HX OF (ICD-V45.89) IMPLANTATION OF DEFIBRILLATOR, HX OF (ICD-V45.02) SIGMOID COLECTOMY 1/09 - D. Ezzard Standing, MD (Colon cancer)  Family History: Reviewed history from 05/31/2009 and no changes required. Dad with DM 1 brother 2 sisters No CAD or HTN in family DM in Dad and mat GM No prostate cancer colon cancer-aunt  Social History: Reviewed history from 05/31/2009 and no changes required. 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   Abdomen:  soft and non-tender.   Genitalia:  circumcised, no scrotal masses, no testicular masses or atrophy, no cutaneous lesions, and no urethral discharge.   Prostate:  no gland enlargement and no nodules.  No tenderness   Impression & Recommendations:  Problem # 1:  URINARY URGENCY (ZOX-096.04) Assessment New  unclear etiology no clear evidence of prostatitis No UTI  P: observe only    will treat prostate if symptoms worsen  Orders: UA Dipstick w/o Micro (manual) (540[address])  Complete Medication List: 1)  Spironolactone 50 Mg Tabs (Spironolactone) .... Take 1 tablet by mouth twice a day 2)  Potassium Chloride Crys Cr 20 Meq Tbcr (Potassium chloride crys cr) .... Take 1 tablet by mouth once a day 3)  Diltiazem Hcl Coated Beads 240 Mg Cp24 (Diltiazem hcl coated beads) .... Take 1 capsule by mouth once a day 4)  Lasix 40 Mg Tabs (Furosemide) .... Take 1 tablet by mouth once a day 5)  Metformin Hcl 1000 Mg Tabs (Metformin hcl) .Marland Kitchen.. 1 tab by mouth two times a day 6)  Amiodarone Hcl 200 Mg Tabs (Amiodarone hcl) .Marland Kitchen.. 1 daily by mouth 7)  Multivitamins Tabs (Multiple vitamin) .Marland Kitchen.. 1 daily by mouth 8)  Glipizide 10 Mg Xr24h-tab (Glipizide) .Marland Kitchen.. 1 tab daily 9)  Coumadin 4 Mg Tabs (Warfarin sodium) .... Takes 6mg  tues and sat 10)  Coumadin 4 Mg Tabs (Warfarin sodium) .... 8 mg  monday,wed, thurs,friday,sunday  Patient Instructions: 1)  Please schedule a follow-up appointment as needed .  2)  Call if the urinary symptoms worsen  Laboratory Results   Urine Tests  Date/Time Received: August 02, 2009 5:44 PM  Date/Time Reported: August 02, 2009 5:44 PM   Routine Urinalysis   Color: yellow Appearance: Clear Glucose: >=1000   (Normal Range: Negative) Bilirubin: negative   (Normal Range: Negative) Ketone: small (15)   (Normal Range: Negative) Spec. Gravity:  1.010   (Normal Range: 1.003-1.035) Blood: negative   (Normal Range: Negative) pH: 8.0   (Normal Range: 5.0-8.0) Protein: 30   (Normal Range: Negative) Urobilinogen: 0.2   (Normal Range: 0-1) Nitrite: negative   (Normal Range: Negative) Leukocyte Esterace: negative   (Normal Range: Negative)

## 2010-02-15 NOTE — Letter (Signed)
Summary: Device-Delinquent Check  [facility], Main Office  [address]   [place], [place] [address]   Phone: [phone]  Fax: [phone]     August 13, 2009 MRN: [id number]   Craig Lindsey [address] [place], [place]  [address]   Dear [name],  According to our records, you have not had your implanted device checked in the recommended period of time.  We are unable to determine appropriate device function without checking your device on a regular basis.  Please call our office to schedule an appointment as soon as possible.  If you are having your device checked by another physician, please call us so that we may update our records.  Thank you,  Altha Harm, LPN  August 13, 2009 1:17 PM  [facility]

## 2010-02-15 NOTE — Cardiovascular Report (Signed)
Summary: Office Visit   Office Visit   Imported By: Roderic Ovens 08/23/2009 15:11:29  _____________________________________________________________________  External Attachment:    Type:   Image     Comment:   External Document

## 2010-02-15 NOTE — Letter (Signed)
Summary: Outpatient Metformin Instructions/[facility] [facility]  Outpatient Metformin Instructions/[facility]   Imported By: Lanelle Bal 10/13/2009 10:40:38  _____________________________________________________________________  External Attachment:    Type:   Image     Comment:   External Document

## 2010-02-15 NOTE — Procedures (Signed)
Summary: Colonoscopy  Patient: Craig Lindsey Note: All result statuses are Final unless otherwise noted.  Tests: (1) Colonoscopy (COL)   COL Colonoscopy           DONE     [facility]     [address] [place], [place]  [address]           COLONOSCOPY PROCEDURE REPORT           PATIENT:  Craig Lindsey, Craig Lindsey  MR#:  [id number]     BIRTHDATE:  [birth year], 49 yrs. old  GENDER:  male     ENDOSCOPIST:  Iva Boop, MD, [facility]           PROCEDURE DATE:  06/29/2009     PROCEDURE:  Colonoscopy with biopsy and snare polypectomy     ASA CLASS:  Class III     INDICATIONS:  surveillance and high-risk screening, history of     colon cancer sigmoid colon cancer resection 01/2007     MEDICATIONS:   Fentanyl 60 mcg, Versed 6 mg           DESCRIPTION OF PROCEDURE:   After the risks benefits and     alternatives of the procedure were thoroughly explained, informed     consent was obtained.  Digital rectal exam was performed and     revealed no abnormalities and normal prostate.   The  endoscope     was introduced through the anus and advanced to the cecum, which     was identified by both the appendix and ileocecal valve, without     limitations.  The quality of the prep was good, using MoviPrep.     The instrument was then slowly withdrawn as the colon was fully     examined. Insertiom 2:20 minutes Withdrawal: 17:00 minutes     <<PROCEDUREIMAGES>>           FINDINGS:  A sessile polyp was found in the descending colon. It     was 7 mm in size. Polyp was snared without cautery. Retrieval was     successful.   Nodular mucosa was found anastamosis Polypoid,     nodular changes. Multiple biopsies were obtained and sent to     pathology.  This was otherwise a normal examination of the colon.     Retroflexed views in the rectum revealed no abnormalities.    The     scope was then withdrawn from the patient and the procedure     completed.           COMPLICATIONS:  None     ENDOSCOPIC  IMPRESSION:     1) 7 mm sessile polyp in the descending colon - removed     2) Nodular mucosa in the anastamosis - biopsied, seems likely to     be post-op changes     3) Otherwise normal examination, good prep     4) Personal history of sigmoid colon cancer 01/2007           RECOMMENDATIONS:     1) Continue current medications     Restart Coumadin (warfarin) tonight, at normal dose.     Have PT/INR checked in 1 week or sooner, using the lab you     usually go to.           REPEAT EXAM:  In for Colonoscopy, pending biopsy results.           Iva Boop, MD,  FACG           CC:  Rolm Baptise, MD     Ovidio Kin, MD     Karie Schwalbe, MD     The Patient           n.     eSIGNED:   Iva Boop at 06/29/2009 09:19 AM           Bonney Leitz, [id number]  Note: An exclamation mark (!) indicates a result that was not dispersed into the flowsheet. Document Creation Date: 06/29/2009 9:20 AM _______________________________________________________________________  (1) Order result status: Final Collection or observation date-time: 06/29/2009 09:01 Requested date-time:  Receipt date-time:  Reported date-time:  Referring Physician:   Ordering Physician: Stan Head [phone]) Specimen Source:  Source: Launa Grill Order Number: [id number] Lab site:   Appended Document: Colonoscopy   Colonoscopy  Procedure date:  06/29/2009  Findings:          1) 7 mm sessile polyp in the descending colon - removed ADENOMA     2) Nodular mucosa in the anastamosis - biopsied, seems likely to     be post-op changes LYMPHOID AGGREGATES     3) Otherwise normal examination, good prep     4) Personal history of sigmoid colon cancer 01/2007  Comments:      Repeat colonoscopy in 3 years.     Procedures Next Due Date:    Colonoscopy: 06/2012

## 2010-02-15 NOTE — Progress Notes (Signed)
Summary: [facility]  Phone Note Call from Patient   Caller: [facility] [employer] Call For: Cindee Salt MD Summary of Call: Lynden Ang from [facility] says that  [employer] is requesting medical records asking why he needs the stockings.  Dating back before 09-29-2008. The fax # is [phone], please see notes on desk, are these ok to send? Initial call taken by: Melody Comas,  Jun 07, 2009 1:51 PM  Follow-up for Phone Call        yes Follow-up by: Cindee Salt MD,  Jun 07, 2009 5:47 PM  Additional Follow-up for Phone Call Additional follow up Details #1::        Notes faxed to [facility]. Additional Follow-up by: Beau Fanny,  Jun 08, 2009 8:12 AM

## 2010-02-15 NOTE — Progress Notes (Signed)
Summary: question re difibulator check   Phone Note Outgoing Call   Caller: Patient Reason for Call: Talk to Nurse Summary of Call: pt had difibulator put in 3 weeks ago-has appt with dr taylor 9-16 wants to make sure it's alright to wait until then to have it checked? pls call [phone] Initial call taken by: Glynda Jaeger,  August 17, 2009 12:13 PM Summary of Call: spoke w/pt and scheduled pt for wound check on 08-19-09 @ 1200.  pt aware of appt in [place]. Vella Kohler  August 18, 2009 9:07 AM

## 2010-02-15 NOTE — Letter (Signed)
Summary: Patient Notice- Polyp Results  [facility]  [address] [place], [place] [address]   Phone: [phone]  Fax: [phone]        July 01, 2009 MRN: [id number]    Craig Lindsey [address] [place], [place]  [address]    Dear Mr. PULLARA,  The polyp removed from your colon was adenomatous. This means that it was pre-cancerous or that  it had the potential to change into cancer over time.   The nodules at the surgical anastomosis area were not polyps and are of no concern.  I recommend that you have a repeat colonoscopy in 3 years to determine if you have developed any new polyps over time. If you develop any new rectal bleeding, abdominal pain or significant bowel habit changes, please contact us before then.  Please call us if you are having persistent problems or have questions about your condition that have not been fully answered at this time.   Sincerely,  Iva Boop MD, [facility]  This letter has been electronically signed by your physician.  Appended Document: Patient Notice- Polyp Results letter printed and mailed.

## 2010-02-15 NOTE — Progress Notes (Signed)
Summary: Meds  Phone Note Call from Patient Call back at Home Phone [phone]   Caller: Patient Call For: Dr. Dayton Martes Summary of Call: pt would like to know if it is ok to take mucinex given his problem list I didn't want to say yes. Please advise Initial call taken by: Mervin Hack CMA Duncan Dull),  January 21, 2009 3:50 PM  Follow-up for Phone Call        Yes that is fine. Follow-up by: Ruthe Mannan MD,  January 21, 2009 4:08 PM  Additional Follow-up for Phone Call Additional follow up Details #1::        Spoke with patient and advised results.  Additional Follow-up by: Mervin Hack CMA Duncan Dull),  January 21, 2009 4:32 PM

## 2010-02-15 NOTE — Medication Information (Signed)
Summary: CCR/ALT    PCP: Tillman Abide, MD Indication 1: Deep venous thrombosis INR RANGE 2.5-3.5           Allergies: No Known Drug Allergies  Anticoagulation Management History:      Positive risk factors for bleeding include presence of serious comorbidities.  Negative risk factors for bleeding include an age less than 50 years old.  The bleeding index is 'intermediate risk'.  Positive CHADS2 values include History of HTN and History of Diabetes.  Negative CHADS2 values include Age > 10 years old.  His last INR was 1.8.    Anticoagulation Management Assessment/Plan:      The patient's current anticoagulation dose is Coumadin 4 mg tabs: Takes 6mg  Tues Thurs  Sat, Coumadin 4 mg tabs: 8 mg  monday,wed,friday,sunday.  The next INR is due 2 weeks at dr appt.

## 2010-02-15 NOTE — Assessment & Plan Note (Signed)
Summary: CPX/RBH   Vital Signs:  Patient profile:   50 year old male Height:      76 inches Weight:      420.38 pounds BMI:     51.36 Temp:     98.4 degrees F oral Pulse rate:   72 / minute Pulse rhythm:   regular BP sitting:   140 / 86  (left arm) Cuff size:   large  Vitals Entered By: Linde Gillis CMA [facility] Dull) (July 23, 2009 9:34 AM) CC: complete physical   History of Present Illness: Doing okay recent colonoscopy showed an adenomatous polyp  had defibrillator changed 2 days ago--- same day surgery No recent discharges in a long time no symptoms   Checks sugars daily Random 180-190 No hypoglycemic reactions Has eye exam next week  Not doing so well on fitness tries to walk and will be limited by right knee pain Tried weight watchers for 2 months---lost 9# --but then it stopped working  still out of work does work in house and yard  Allergies (verified): No Known Drug Allergies  Past History:  Past medical, surgical, family and social histories (including risk factors) reviewed for relevance to current acute and chronic problems.  Past Medical History: Reviewed history from 05/31/2009 and no changes required. Diabetes mellitus, type II Hyperlipidemia Hypertension Ventricular tachycardia--12/04-----------------------------------------Dr Revonda Standard fibrillation Sleep apnea Obesity Hemorrhoids 1/09 St III Colon cancer-------------------------------------------------------Dr Verdis Frederickson, D. Newman Oxaliplatin neuropathy DVT RLE 7/09, Tx Arixtra Complex L renal cyst  Past Surgical History: Reviewed history from 05/31/2009 and no changes required. CHOLECYSTECTOMY, LAPAROSCOPIC, HX OF (ICD-V45.79) UMBILICAL HERNIORRHAPHY, HX OF (ICD-V45.89) IMPLANTATION OF DEFIBRILLATOR, HX OF (ICD-V45.02) SIGMOID COLECTOMY 1/09 - D. Ezzard Standing, MD (Colon cancer)  Family History: Reviewed history from 05/31/2009 and no changes required. Dad with DM 1 brother 2  sisters No CAD or HTN in family DM in Dad and mat GM No prostate cancer colon cancer-aunt  Social History: Reviewed history from 05/31/2009 and no changes required. 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 Eyes:  Denies double vision and vision loss-1 eye. ENT:  Denies decreased hearing and ringing in ears; teeth okay---regular with dentist. CV:  Complains of shortness of breath with exertion; denies chest pain or discomfort, difficulty breathing at night, difficulty breathing while lying down, fainting, and palpitations; stable DOE. Resp:  Denies cough and shortness of breath. GI:  Denies abdominal pain, bloody stools, change in bowel habits, dark tarry stools, indigestion, nausea, and vomiting. GU:  Denies erectile dysfunction, urinary frequency, and urinary hesitancy. MS:  Complains of joint pain; denies joint swelling; right knee is main issue with pain---discussed tylenol before walking. Derm:  Denies lesion(s) and rash. Neuro:  Denies headaches, numbness, tingling, and weakness. Psych:  Denies anxiety and depression. Heme:  Denies abnormal bruising, bleeding, and enlarge lymph nodes. Allergy:  Denies seasonal allergies and sneezing.  Physical Exam  General:  alert and normal appearance.   Eyes:  pupils equal, pupils round, and pupils reactive to light.   Ears:  R ear normal and L ear normal.   Mouth:  good dentition, no erythema, and no lesions.   Neck:  supple, no masses, no thyromegaly, no carotid bruits, and no cervical lymphadenopathy.   Chest Wall:  steristrips over surgical site clean without inflammation Lungs:  normal respiratory effort, no intercostal retractions, no  accessory muscle use, and normal breath sounds.   Heart:  normal rate, regular rhythm, no murmur, and no  gallop.   Abdomen:  soft and non-tender.   Msk:  no joint tenderness and no joint swelling.   Pulses:  2+ in feet Extremities:  thick calves without pitting Neurologic:  alert & oriented X3, strength normal in all extremities, and gait normal.   Skin:  no suspicious lesions and no ulcerations.   Axillary Nodes:  No palpable lymphadenopathy Psych:  normally interactive, good eye contact, not anxious appearing, and not depressed appearing.    Diabetes Management Exam:    Foot Exam (with socks and/or shoes not present):       Sensory-Pinprick/Light touch:          Left medial foot (L-4): normal          Left dorsal foot (L-5): normal          Left lateral foot (S-1): normal          Right medial foot (L-4): normal          Right dorsal foot (L-5): normal          Right lateral foot (S-1): normal       Inspection:          Left foot: normal          Right foot: normal       Nails:          Left foot: normal          Right foot: normal   Impression & Recommendations:  Problem # 1:  PREVENTIVE HEALTH CARE (ICD-V70.0) Assessment Comment Only must work on fitness counselled about slow, regular weight loss as being critical for his health just had colonoscopy PSA next year  Problem # 2:  DIABETES MELLITUS, TYPE II (ICD-250.00) Assessment: Unchanged  hopefully still acceptable control discussed lifestyle measures  His updated medication list for this problem includes:    Metformin Hcl 1000 Mg Tabs (Metformin hcl) .Marland Kitchen... 1 tab by mouth two times a day    Glipizide 10 Mg Xr24h-tab (Glipizide) .Marland Kitchen... 1 tab daily  Labs Reviewed: Creat: 1.02 (07/16/2009)     Last Eye Exam: no retinopathy (07/17/2007) Reviewed HgBA1c results: 6.6 (09/25/2008)  7.0 (06/01/2008)  Orders: TLB-TSH (Thyroid Stimulating Hormone) (84443-TSH) TLB-Microalbumin/Creat Ratio, Urine (82043-MALB) TLB-A1C / Hgb A1C (Glycohemoglobin) (83036-A1C)  Problem # 3:  HYPERLIPIDEMIA (ICD-272.4) Assessment: Comment  Only  LDL goal is under 100 will start medicine if over 130  Labs Reviewed: SGOT: 23 (03/08/2007)   SGPT: 36 (03/08/2007)  Prior 10 Yr Risk Heart Disease: Not enough information (06/19/2006)   HDL:28.6 (03/08/2007), 28.6 (06/19/2006)  LDL:DEL (03/08/2007), DEL (06/19/2006)  Chol:204 (03/08/2007), 204 (06/19/2006)  Trig:242 (03/08/2007), 242 (06/19/2006)  Orders: TLB-Lipid Panel (80061-LIPID) TLB-Hepatic/Liver Function Pnl (80076-HEPATIC) Venipuncture (64403)  Problem # 4:  HYPERTENSION (ICD-401.9) Assessment: Unchanged reasonable control  His updated medication list for this problem includes:    Spironolactone 50 Mg Tabs (Spironolactone) .Marland Kitchen... Take 1 tablet by mouth twice a day    Diltiazem Hcl Coated Beads 240 Mg Cp24 (Diltiazem hcl coated beads) .Marland Kitchen... Take 1 capsule by mouth once a day    Lasix 40 Mg Tabs (Furosemide) .Marland Kitchen... Take 1 tablet by mouth once a day  BP today: 140/86 Prior BP: 175/84 (07/07/2009)  Prior 10 Yr Risk Heart Disease: Not enough information (06/19/2006)  Labs Reviewed: K+: 4.4 (07/16/2009) Creat: : 1.02 (07/16/2009)   Chol: 204 (03/08/2007)   HDL:  28.6 (03/08/2007)   LDL: DEL (03/08/2007)   TG: 242 (03/08/2007)  Problem # 5:  ATRIAL FIBRILLATION (ICD-427.31) Assessment: Comment Only still regular on amiodarone protimes monitored regularly  His updated medication list for this problem includes:    Diltiazem Hcl Coated Beads 240 Mg Cp24 (Diltiazem hcl coated beads) .Marland Kitchen... Take 1 capsule by mouth once a day    Amiodarone Hcl 200 Mg Tabs (Amiodarone hcl) .Marland Kitchen... 1 daily by mouth    Coumadin 4 Mg Tabs (Warfarin sodium) .Marland Kitchen... Takes 6mg  tues and sat    Coumadin 4 Mg Tabs (Warfarin sodium) .Marland Kitchen... 8 mg  monday,wed, thurs,friday,sunday  Complete Medication List: 1)  Spironolactone 50 Mg Tabs (Spironolactone) .... Take 1 tablet by mouth twice a day 2)  Potassium Chloride Crys Cr 20 Meq Tbcr (Potassium chloride crys cr) .... Take 1 tablet by mouth once a day 3)   Diltiazem Hcl Coated Beads 240 Mg Cp24 (Diltiazem hcl coated beads) .... Take 1 capsule by mouth once a day 4)  Lasix 40 Mg Tabs (Furosemide) .... Take 1 tablet by mouth once a day 5)  Metformin Hcl 1000 Mg Tabs (Metformin hcl) .Marland Kitchen.. 1 tab by mouth two times a day 6)  Amiodarone Hcl 200 Mg Tabs (Amiodarone hcl) .Marland Kitchen.. 1 daily by mouth 7)  Multivitamins Tabs (Multiple vitamin) .Marland Kitchen.. 1 daily by mouth 8)  Glipizide 10 Mg Xr24h-tab (Glipizide) .Marland Kitchen.. 1 tab daily 9)  Coumadin 4 Mg Tabs (Warfarin sodium) .... Takes 6mg  tues and sat 10)  Coumadin 4 Mg Tabs (Warfarin sodium) .... 8 mg  monday,wed, thurs,friday,sunday  Patient Instructions: 1)  Please schedule a follow-up appointment in 6 months .   Current Allergies (reviewed today): No known allergies

## 2010-02-15 NOTE — Progress Notes (Signed)
Summary: pt has fever  Phone Note Call from Patient Call back at Home Phone [phone]   Caller: Patient Call For: Cindee Salt MD Summary of Call: Pt was seen on monday for possible UTI.  He is not having as much pressure now but he is keeping a fever of 100.5-101.  No other sxs, just the fever.   He is wondering why this is going on.  [facility] [place] Initial call taken by: Lowella Petties CMA,  August 04, 2009 2:31 PM  Follow-up for Phone Call        As I told him then, this may be a prostate infection. The presence of fever now indicates that that is likely  Please send Rx for cipro 500mg  two times a day  #42 x 0 It is usual to treat prostate infections for 3 weeks Set up protime next week to make sure the cipro is not affecting the coumadin too much Follow-up by: Cindee Salt MD,  August 04, 2009 3:16 PM  Additional Follow-up for Phone Call Additional follow up Details #1::        Advised pt, [facility] emr updated.  Call sent to terri to schedule protime, since pt is due for one this week. Additional Follow-up by: Lowella Petties CMA,  August 04, 2009 3:40 PM    New/Updated Medications: CIPRO 500 MG TABS (CIPROFLOXACIN HCL) take one by mouth two times a day times 3 weeks Prescriptions: CIPRO 500 MG TABS (CIPROFLOXACIN HCL) take one by mouth two times a day times 3 weeks  #42 x 0   Entered by:   Lowella Petties CMA   Authorized by:   Cindee Salt MD   Signed by:   Lowella Petties CMA on 08/04/2009   Method used:   Telephoned to ...       [facility]  [address] [phone][facility] (retail[facility]       [address]       [place], [place]  [address]       Ph: [phone]       Fax: [phone]   RxID:   [id number]   Prior Medications: SPIRONOLACTONE 50 MG TABS (SPIRONOLACTONE) Take 1 tablet by mouth twice a day POTASSIUM CHLORIDE CRYS CR 20 MEQ TBCR (POTASSIUM CHLORIDE CRYS CR) Take 1 tablet by mouth once a day DILTIAZEM HCL COATED BEADS 240 MG CP24  (DILTIAZEM HCL COATED BEADS) Take 1 capsule by mouth once a day LASIX 40 MG TABS (FUROSEMIDE) Take 1 tablet by mouth once a day METFORMIN HCL 1000 MG  TABS (METFORMIN HCL) 1 tab by mouth two times a day AMIODARONE HCL 200 MG  TABS (AMIODARONE HCL) 1 daily by mouth MULTIVITAMINS   TABS (MULTIPLE VITAMIN) 1 daily by mouth GLIPIZIDE 10 MG XR24H-TAB (GLIPIZIDE) 1 tab daily COUMADIN 4 MG TABS (WARFARIN SODIUM) Takes 6mg  Tues and Sat COUMADIN 4 MG TABS (WARFARIN SODIUM) 8 mg  monday,wed, thurs,friday,sunday Current Allergies: No known allergies

## 2010-02-15 NOTE — Assessment & Plan Note (Signed)
Summary: F/U 1 YEAR      Allergies Added: NKDA  Visit Type:  Follow-up Referring Provider:  Verdis Frederickson Primary Provider:  Tillman Abide, MD  CC:  no cardiac problems.  History of Present Illness: Mr. Tramell returns today for followup.  He is a pleasant 50 yo man with a h/o atrial fib and recurrent ventricular fibrillation for which he underwent ICD implant over 6 yrs ago. He has developed colon CA and is currently Cancer free.  He denies c/p, sob, or syncope.  He is and remains morbidly obese, though he continues to try and lose weight.  No other complaints today.    Current Medications (verified): 1)  Spironolactone 50 Mg Tabs (Spironolactone) .... Take 1 Tablet By Mouth Twice A Day 2)  Potassium Chloride Crys Cr 20 Meq Tbcr (Potassium Chloride Crys Cr) .... Take 1 Tablet By Mouth Once A Day 3)  Diltiazem Hcl Coated Beads 240 Mg Cp24 (Diltiazem Hcl Coated Beads) .... Take 1 Capsule By Mouth Once A Day 4)  Lasix 40 Mg Tabs (Furosemide) .... Take 1 Tablet By Mouth Once A Day 5)  Metformin Hcl 1000 Mg  Tabs (Metformin Hcl) .Marland Kitchen.. 1 Tab By Mouth Two Times A Day 6)  Amiodarone Hcl 200 Mg  Tabs (Amiodarone Hcl) .Marland Kitchen.. 1 Daily By Mouth 7)  Multivitamins   Tabs (Multiple Vitamin) .Marland Kitchen.. 1 Daily By Mouth 8)  Glipizide 10 Mg Xr24h-Tab (Glipizide) .Marland Kitchen.. 1 Tab Daily 9)  Coumadin 4 Mg Tabs (Warfarin Sodium) .... Takes 6mg  Tues Thurs  Sat 10)  Coumadin 4 Mg Tabs (Warfarin Sodium) .... 8 Mg  Monday,wed,friday,"sunday  Allergies (verified): No Known Drug Allergies  Past History:  Past Medical History: Last updated: 05/31/2009 Diabetes mellitus, type II Hyperlipidemia Hypertension Ventricular tachycardia--12/04-----------------------------------------Dr Jazman Reuter Atrail fibrillation Sleep apnea Obesity Hemorrhoids 1/09 St III Colon cancer-------------------------------------------------------Dr Gessner, Sherrill, D. Newman Oxaliplatin neuropathy DVT RLE 7/09, Tx Arixtra Complex L renal  cyst  Past Surgical History: Last updated: 05/31/2009 CHOLECYSTECTOMY, LAPAROSCOPIC, HX OF (ICD-V45.79) UMBILICAL HERNIORRHAPHY, HX OF (ICD-V45.89) IMPLANTATION OF DEFIBRILLATOR, HX OF (ICD-V45.02) SIGMOID COLECTOMY 1/09 - D. Newman, MD (Colon cancer)  Family History: Last updated: 05/31/2009 Dad with DM 1 brother 2 sisters No CAD or HTN in family DM in Dad and mat GM No prostate cancer colon cancer-aunt  Social History: Last updated: 05/31/2009 Occupation: Brick salesman--out of work Married--2 step children (wife's 2nd marriage) Never Smoked Alcohol use-yes--occ Illicit Drug Use - no  Risk Factors: Smoking Status: never (06/19/2006)  Review of Systems  The patient denies chest pain, syncope, dyspnea on exertion, and peripheral edema.    Vital Signs:  Patient profile:   49 year old male Height:      76 inches Weight:      416 pounds BMI:     50" .82 Pulse rate:   87 / minute BP sitting:   175 / 84  (left arm) Cuff size:   large  Vitals Entered By: Bishop Dublin, CMA (July 07, 2009 9:46 AM)  Physical Exam  General:  alert and normal appearance.  obese Head:  Normocephalic and atraumatic without obvious abnormalities. No apparent alopecia or balding. Eyes:  anicteric Mouth:  No deformity or lesions, dentition normal. Neck:  supple, no masses, no thyromegaly, no carotid bruits, and no cervical lymphadenopathy.   Chest Wall:  Well healed ICD incision. Lungs:  Clear throughout to auscultation with no wheezes, rales, or rhonchi. Heart:  Regular rate and rhythm; no murmurs, rubs,  or bruits. Distant Abdomen:  obese, soft  and nontender Pulses:  1+ in feet Extremities:  thick calves but no pitting or sig edema  Neurologic:  Alert and  oriented x3   EKG  Procedure date:  07/07/2009  Findings:      Normal sinus rhythm with rate of:  87.   ICD Specifications Following MD:  Lewayne Bunting, MD     ICD Vendor:  [employer]     ICD Model Number:  7278     ICD Serial  Number:  [id number] ICD DOI:  01/29/2003     ICD Implanting MD:  Lewayne Bunting, MD  Lead 1:    Location: RA     DOI: 01/29/2003     Model #: 4098     Serial #: [id number]     Status: active Lead 2:    Location: RV     DOI: 01/29/2003     Model #: 5621     Serial #: [id number]     Status: active  Indications::  VF ARREST   ICD Follow Up Remote Check?  No Battery Voltage:  2.62 V     Charge Time:  11.0 seconds     Battery Est. Longevity:  ERI Underlying rhythm:  SR ICD Dependent:  No       ICD Device Measurements Atrium:  Amplitude: 0.7 mV, Impedance: >3000 ohms, Threshold: 1.0 V at 0.1 msec Right Ventricle:  Amplitude: 10.7 mV, Impedance: 712 ohms, Threshold: 1.0 V at 0.2 msec Shock Impedance: 61/76 ohms   Episodes MS Episodes:  0     Percent Mode Switch:  0     Coumadin:  Yes Shock:  0     ATP:  0     Nonsustained:  0     Atrial Pacing:  0.2%     Ventricular Pacing:  0.1%  Brady Parameters Mode DDI     Lower Rate Limit:  50     Upper Rate Limit 120 PAV 300     Sensed AV Delay:  300  Tachy Zones VF:  200     VT:  240     VT1:  176     Tech Comments:  Battery @ ERI 07/02/09.  Atrial lead impedance >3000 today.  It has been on the rise since last office visit 06/29/08, 1728, and by remote 09/29/08 measuring 2160, 12/29/08 measuring 2384 and 03/30/09 measuring 2880.  His threshold has remained stable.  6949 lead stable, SIC  0.  I turned off the alerts for both atrial lead impedance and low battery today.   Altha Harm, LPN  July 07, 2009 10:06 AM  MD Comments:  Agree with above.  Impression & Recommendations:  Problem # 1:  IMPLANTATION OF DEFIBRILLATOR, HX OF (ICD-V45.02) His device has reached ERI.  He has malfunction of his atrial lead.  As he has not used this lead I would not plan to place a new one at the time  of his ICD generator change.  The risks/benefits/goals/expectations of ICD generator change have been discussed with the patient and he wishes to proceed.  Problem # 2:   ATRIAL FIBRILLATION (ICD-427.31) He appears to be maintaining NSR on low dose amiodarone. The following medications were removed from the medication list:    Toprol Xl 100 Mg Xr24h-tab (Metoprolol succinate) .Marland Kitchen... Take one and one half by mouth daily His updated medication list for this problem includes:    Amiodarone Hcl 200 Mg Tabs (Amiodarone hcl) .Marland Kitchen... 1 daily by mouth    Coumadin 4 Mg  Tabs (Warfarin sodium) .Marland Kitchen... Takes 6mg  tues thurs  sat    Coumadin 4 Mg Tabs (Warfarin sodium) .Marland Kitchen... 8 mg  monday,wed,friday,sunday  Problem # 3:  HYPERTENSION (ICD-401.9) His blood pressure is elevated.  I have continued to stress the importance of weight loss and a low sodium diet. The following medications were removed from the medication list:    Toprol Xl 100 Mg Xr24h-tab (Metoprolol succinate) .Marland Kitchen... Take one and one half by mouth daily His updated medication list for this problem includes:    Spironolactone 50 Mg Tabs (Spironolactone) .Marland Kitchen... Take 1 tablet by mouth twice a day    Diltiazem Hcl Coated Beads 240 Mg Cp24 (Diltiazem hcl coated beads) .Marland Kitchen... Take 1 capsule by mouth once a day    Lasix 40 Mg Tabs (Furosemide) .Marland Kitchen... Take 1 tablet by mouth once a day  Patient Instructions: 1)  Your physician recommends that you continue on your current medications as directed. Please refer to the Current Medication list given to you today. 2)  Need to call and schedule ICD change ASAP With Morrie Sheldon [phone]  Appended Document: Orders Update    Clinical Lists Changes  Orders: Added new Referral order of Bi-V ICD (Bi-V ICD) - Signed

## 2010-02-15 NOTE — Letter (Signed)
Summary: Anticoagulation Modification Letter  [facility]  [address] [place], [place] [address]   Phone: [phone]  Fax: [phone]    Jun 01, 2009  Re:    RUE TINNEL DOB:    29-Jun-1960 MRN:  [id number]    Dear Dr. Ladona Ridgel:  We have scheduled the above patient for an endoscopic procedure with Dr. Leone Payor. Our records show that  he is on anticoagulation therapy. Please advise as to how long the patient may come off their therapy of Coumadin prior to the scheduled procedure on June 29, 2009.  Please route the completed form to Francee Piccolo, CMA.  Thank you for your help with this matter.  Sincerely,  Francee Piccolo CMA [facility] Dull)   Physician Recommendation:  Hold Coumadin 3-5 days prior ____________      Appended Document: Anticoagulation Modification Letter May stop coumadin for 5 days prior to procedure.  Appended Document: Anticoagulation Modification Letter Pt notified OK to hold coumadin per Dr. Ladona Ridgel.  Pt to stop coumadin on 06/25/09.  Pt voices understanding and can repeat these instructions.  Hardcopy of this letter printed and filed in [facility] chart.  Appended Document: Anticoagulation Modification Letter Pt is having a hospital procedure.  No hardcopy printed.

## 2010-02-15 NOTE — Cardiovascular Report (Signed)
Summary: Office Visit Remote   Office Visit Remote   Imported By: Roderic Ovens 04/15/2009 11:40:54  _____________________________________________________________________  External Attachment:    Type:   Image     Comment:   External Document

## 2010-02-15 NOTE — Miscellaneous (Signed)
Summary: COUMADIN  Medications Added COUMADIN 4 MG TABS (WARFARIN SODIUM) use as directed       Clinical Lists Changes  Medications: Removed medication of COUMADIN 4 MG TABS (WARFARIN SODIUM) Takes 6mg  Tues and Sat Changed medication from COUMADIN 4 MG TABS (WARFARIN SODIUM) 8 mg  monday,wed, thurs,friday,sunday to COUMADIN 4 MG TABS (WARFARIN SODIUM) use as directed - Signed Rx of COUMADIN 4 MG TABS (WARFARIN SODIUM) use as directed;  #60 x 12;  Signed;  Entered by: DeShannon Smith CMA (AAMA);  Authorized by: Richard Ira Letvak MD;  Method used: [facility]  [address] [id number] [address], [place], [place], [place]  [address], Ph: [phone], Fax: [phone]    Prescriptions: COUMADIN 4 MG TABS (WARFARIN SODIUM) use as directed  #60 x 12   Entered by:   DeShannon Smith CMA (AAMA)   Authorized by:   Richard Ira Letvak MD   Signed by:   DeShannon Smith CMA (AAMA) on 11/10/2009   Method used:   [facility]        [facility]  [address] [id number]       [address]       [place], [place]  [address]       Ph: [phone]       Fax: [phone]   RxID:   [id number]

## 2010-02-15 NOTE — Assessment & Plan Note (Signed)
Summary: DIR COL/COUMADIN...AS.    History of Present Illness Visit Type: Initial Consult Primary GI MD: Stan Head MD [facility] Primary Provider: Tillman Abide, MD Requesting Provider: Verdis Frederickson Chief Complaint: Consult for colonoscopy pt on blood thinners, No GI complaints History of Present Illness:   50 yo wm with sigmoid colon cancer resected 01/2007 and s/p 8 cycles FOLOFOX chemotherapy.   GI Review of Systems      Denies abdominal pain, acid reflux, belching, bloating, chest pain, dysphagia with liquids, dysphagia with solids, heartburn, loss of appetite, nausea, vomiting, vomiting blood, weight loss, and  weight gain.        Denies anal fissure, black tarry stools, change in bowel habit, constipation, diarrhea, diverticulosis, fecal incontinence, heme positive stool, hemorrhoids, irritable bowel syndrome, jaundice, light color stool, liver problems, rectal bleeding, and  rectal pain. Preventive Screening-Counseling & Management      Drug Use:  no.      Current Medications (verified): 1)  Spironolactone 50 Mg Tabs (Spironolactone) .... Take 1 Tablet By Mouth Twice A Day 2)  Potassium Chloride Crys Cr 20 Meq Tbcr (Potassium Chloride Crys Cr) .... Take 1 Tablet By Mouth Once A Day 3)  Diltiazem Hcl Coated Beads 240 Mg Cp24 (Diltiazem Hcl Coated Beads) .... Take 1 Capsule By Mouth Once A Day 4)  Lasix 40 Mg Tabs (Furosemide) .... Take 1 Tablet By Mouth Once A Day 5)  Metformin Hcl 1000 Mg  Tabs (Metformin Hcl) .Marland Kitchen.. 1 Tab By Mouth Two Times A Day 6)  Amiodarone Hcl 200 Mg  Tabs (Amiodarone Hcl) .Marland Kitchen.. 1 Daily By Mouth 7)  Multivitamins   Tabs (Multiple Vitamin) .Marland Kitchen.. 1 Daily By Mouth 8)  Toprol Xl 100 Mg Xr24h-Tab (Metoprolol Succinate) .... Take One and One Half By Mouth Daily 9)  Glipizide 10 Mg Xr24h-Tab (Glipizide) .Marland Kitchen.. 1 Tab Daily 10)  Coumadin 4 Mg Tabs (Warfarin Sodium) .... Takes 6mg  Tues Thurs  Sat 11)  Coumadin 4 Mg Tabs (Warfarin Sodium) .... 8 Mg   Monday,wed,friday,"sunday  Allergies (verified): No Known Drug Allergies  Past History:  Past Medical History: Diabetes mellitus, type II Hyperlipidemia Hypertension Ventricular tachycardia--12/04-----------------------------------------Dr Taylor Atrail fibrillation Sleep apnea Obesity Hemorrhoids 1/09 St [facility] Colon cancer-------------------------------------------------------Dr Gessner, Sherrill, D. Newman Oxaliplatin neuropathy DVT RLE 7/09, Tx Arixtra Complex L renal cyst  Past Surgical History: CHOLECYSTECTOMY, LAPAROSCOPIC, HX OF (ICD-V45.79) UMBILICAL HERNIORRHAPHY, HX OF (ICD-V45.89) IMPLANTATION OF DEFIBRILLATOR, HX OF (ICD-V45.02) SIGMOID COLECTOMY 1/09 - D. Newman, MD (Colon cancer)  Family History: Dad with DM 1 brother 2 sisters No CAD or HTN in family DM in Dad and mat GM No prostate cancer colon cancer-aunt  Social History: Occupation: Brick salesman--out of work Married--2 step children (wife's 2nd marriage) Never Smoked Alcohol use-yes--occ Illicit Drug Use - no Drug Use:  no  Review of Systems       The patient complains of swelling of feet/legs.         All other ROS negative except as per HPI.   Vital Signs:  Patient profile:   49 year old male Height:      76 inches Weight:      411 pounds BMI:     50" .21 BSA:     3.01 Pulse rate:   88 / minute Pulse rhythm:   regular BP sitting:   142 / 82  (left arm)  Vitals Entered By: Merri Ray CMA Duncan Dull) (May 31, 2009 10:32 AM)  Physical Exam  General:  alert and normal appearance.  obese Eyes:  anicteric Lungs:  Clear throughout to auscultation. Heart:  Regular rate and rhythm; no murmurs, rubs,  or bruits. Distant Abdomen:  obese, soft and nontender Extremities:  thick calves but no pitting or sig edema  Neurologic:  Alert and  oriented x3   Impression & Recommendations:  Problem # 1:  ADENOCARCINOMA, COLON, HX OF (ICD-V10.05) Assessment Unchanged Dx and resected 1/09. Had a  microscopic deposit met in peritoneum resection specimen. Had 8 cycles FOLFOX and is without evidence for recurrence so far. Needs screening and surveillance colonoscopy. At hospital due to obesity. Risks, benefits,and indications of endoscopic procedure(s) were reviewed with the patient and all questions answered.  Problem # 2:  SCREENING, COLON CANCER (ICD-V76.51) Assessment: Unchanged He is at higher risk of (future) colon cancer and polyps  Problem # 3:  ANTICOAGULATION THERAPY (ICD-V58.61) Assessment: Unchanged takes for A fib ainly also hx DVT will ask Dr. Ladona Ridgel about apprriateness of holding 3-5 days prior to colonoscopy and need for any other (bridging) therapy.  Problem # 4:  IMPLANTATION OF DEFIBRILLATOR, HX OF (ICD-V45.02) Assessment: Unchanged  Patient Instructions: 1)  Please pick up your medications at your pharmacy. MOVIPREP 2)  We will see you at your procedure on 06/29/09 at [facility]. 3)  Colonoscopy and Flexible Sigmoidoscopy brochure given.  4)  You will be contaced by our office prior to your procedure for directions on holding your Coumadin/Warfarin.  If you do not hear from our office 1 week prior to your scheduled procedure, please call [phone] to discuss.  5)  The medication list was reviewed and reconciled.  All changed / newly prescribed medications were explained.  A complete medication list was provided to the patient / caregiver. Prescriptions: MOVIPREP 100 GM  SOLR (PEG-KCL-NACL-NASULF-NA ASC-C) As per prep instructions.  #1 x 0   Entered by:   Francee Piccolo CMA (AAMA)   Authorized by:   Iva Boop MD, [facility]   Signed by:   Francee Piccolo CMA (AAMA) on 05/31/2009   Method used:   [phone]* (retail)       [address]       [place], [place]  [address]       Ph: [phone]       Fax: [phone]   RxID:   [id number]   Appended Document: DIR  COL/COUMADIN...AS.   Colonoscopy  Procedure date:  01/21/2007  Findings:      adenocarcinoma, sigmoid diverticulosis hemorrhoids

## 2010-02-15 NOTE — Letter (Signed)
Summary: Remote Device Check  [facility], Main Office  [address]   [place], [place] [address]   Phone: [phone]  Fax: [phone]     April 13, 2009 MRN: [id number]   Craig Lindsey [address] [place], [place]  [address]   Dear [name],   Your remote transmission was recieved and reviewed by your physician.  All diagnostics were within normal limits for you.   ___X___Your next office visit is scheduled for:  JUNE 2011 WITH DR Ladona Ridgel. Please call our office to schedule an appointment.    Sincerely,  Proofreader

## 2010-02-15 NOTE — Letter (Signed)
Summary: [facility]  [facility]   Imported By: Sherian Rein 10/28/2009 10:39:16  _____________________________________________________________________  External Attachment:    Type:   Image     Comment:   External Document  Appended Document: [facility] No evidence of recurrent colon cancer

## 2010-02-15 NOTE — Letter (Signed)
[facility]   Imported By: Lanelle Bal 05/06/2009 08:53:34  _____________________________________________________________________  External Attachment:    Type:   Image     Comment:   External Document  Appended Document: [facility] colon cancer stable Due for repeat colonoscopy Repeat CT then appt in September

## 2010-02-15 NOTE — Letter (Signed)
Summary: Diabetic Instructions  [facility]  [address] [place], [place] [address]   Phone: [phone]  Fax: [phone]    Craig Lindsey September 23, 1960 MRN: [id number]   _X_   ORAL DIABETIC MEDICATION INSTRUCTIONS  The day before your procedure:   Take your diabetic pill as you do normally  The day of your procedure:   Do not take your diabetic pill    We will check your blood sugar levels during the admission process and again in Recovery before discharging you home

## 2010-02-15 NOTE — Progress Notes (Signed)
Summary: feeling pressure w/ urination   Phone Note Call from Patient Call back at Home Phone [phone]   Caller: Patient Call For: Cindee Salt MD Summary of Call: Patient called saying that he is feeling pressure after urination has a fever of 100.4. Feels like he has uti. He wanted to see you this afternoon. You have nothing available, and there is nothing available with any one else.  He wants to know if he could be added or [facility] [place]. Patient was last seen on 07-23-09.Please advise.  Initial call taken by: Melody Comas,  August 02, 2009 2:24 PM  Follow-up for Phone Call        okay to add on at end of today Follow-up by: Cindee Salt MD,  August 02, 2009 3:23 PM  Additional Follow-up for Phone Call Additional follow up Details #1::        Appointment made, pt advised. Additional Follow-up by: Lowella Petties CMA,  August 02, 2009 3:30 PM

## 2010-02-15 NOTE — Assessment & Plan Note (Signed)
[facility]      Allergies Added: NKDA  Visit Type:  Follow-up Referring Provider:  Verdis Frederickson Primary Provider:  Tillman Abide, MD  CC:  "Doing well".  Denies any chest pain or shortness of breath..  History of Present Illness: [name] returns today for followup.  He is a pleasant 50 yo man with a h/o atrial fib and recurrent ventricular fibrillation for which he underwent ICD implant over 6 yrs ago with a generator change several months ago.  He had developed colon CA and is currently Cancer free after a clean CT scan a month ago.  He denies c/p, sob, or syncope.  He is and remains morbidly obese, though he continues to try and lose weight.  No other complaints today.  He is still considering bariatric surgery.  Current Medications (verified): 1)  Spironolactone 50 Mg Tabs (Spironolactone) .... Take 1 Tablet By Mouth Twice A Day 2)  Potassium Chloride Crys Cr 20 Meq Tbcr (Potassium Chloride Crys Cr) .... Take 1 Tablet By Mouth Once A Day 3)  Diltiazem Hcl Coated Beads 240 Mg Cp24 (Diltiazem Hcl Coated Beads) .... Take 1 Capsule By Mouth Once A Day 4)  Lasix 40 Mg Tabs (Furosemide) .... Take 1 Tablet By Mouth Once A Day 5)  Metformin Hcl 1000 Mg  Tabs (Metformin Hcl) .Marland Kitchen.. 1 Tab By Mouth Two Times A Day 6)  Amiodarone Hcl 200 Mg  Tabs (Amiodarone Hcl) .Marland Kitchen.. 1 Daily By Mouth 7)  Multivitamins   Tabs (Multiple Vitamin) .Marland Kitchen.. 1 Daily By Mouth 8)  Glipizide 10 Mg Xr24h-Tab (Glipizide) .Marland Kitchen.. 1 Tab Daily 9)  Coumadin 4 Mg Tabs (Warfarin Sodium) .... Takes 6mg  Tues and Sat 10)  Coumadin 4 Mg Tabs (Warfarin Sodium) .... 8 Mg  Monday,wed, Thurs,friday,sunday 11)  Metoprolol Succinate 100 Mg Xr24h-Tab (Metoprolol Succinate) .... 1 1/2 By Mouth  Daily  Allergies (verified): No Known Drug Allergies  Past History:  Past Medical History: Last updated: 05/31/2009 Diabetes mellitus, type II Hyperlipidemia Hypertension Ventricular  tachycardia--12/04-----------------------------------------Dr Taylor Atrail fibrillation Sleep apnea Obesity Hemorrhoids 1/09 St III Colon cancer-------------------------------------------------------Dr Gessner, Sherrill, D. Newman Oxaliplatin neuropathy DVT RLE 7/09, Tx Arixtra Complex L renal cyst  Past Surgical History: Last updated: 05/31/2009 CHOLECYSTECTOMY, LAPAROSCOPIC, HX OF (ICD-V45.79) UMBILICAL HERNIORRHAPHY, HX OF (ICD-V45.89) IMPLANTATION OF DEFIBRILLATOR, HX OF (ICD-V45.02) SIGMOID COLECTOMY 1/09 - D. Newman, MD (Colon cancer)  Risk Factors: Smoking Status: never (06/19/2006)  Review of Systems  The patient denies chest pain, syncope, dyspnea on exertion, and peripheral edema.    Vital Signs:  Patient profile:   49 year old male Height:      76 inches Weight:      420 pounds BMI:     51 .31 Pulse rate:   84 / minute BP sitting:   158 / 82  (right arm) Cuff size:   large  Vitals Entered By: Bishop Dublin, CMA (November 02, 2009 11:25 AM)  Physical Exam  General:  alert and normal appearance.   Head:  Normocephalic and atraumatic without obvious abnormalities. No apparent alopecia or balding. Eyes:  pupils equal, pupils round, and pupils reactive to light.   Mouth:  good dentition, no erythema, and no lesions.   Neck:  supple, no masses, no thyromegaly, no carotid bruits, and no cervical lymphadenopathy.   Chest Wall:  Well healed ICD incision Lungs:  normal respiratory effort, no intercostal retractions, no accessory muscle use, and normal breath sounds.   Heart:  normal rate, regular rhythm, no murmur, and no  gallop.   Abdomen:  soft and non-tender.   Msk:  no joint tenderness and no joint swelling.   Pulses:  2+ in feet Extremities:  thick calves without pitting Neurologic:  alert & oriented X3, strength normal in all extremities, and gait normal.      ICD Specifications Following MD:  Lewayne Bunting, MD     ICD Vendor:  [employer]     ICD Model  Number:  [id number]     ICD Serial Number:  [id number] ICD DOI:  07/21/2009     ICD Implanting MD:  Lewayne Bunting, MD  Lead 1:    Location: RA     DOI: 01/29/2003     Model #: [id number]     Serial #: [id number]     Status: capped Lead 2:    Location: RV     DOI: 01/29/2003     Model #: [id number]     Serial #: [id number]     Status: active  Indications::  VF ARREST  Explantation Comments: 07/21/09 Med[employer]onic [id number] S explanted  ICD Follow Up Remote Check?  No Battery Voltage:  3.22 V     Charge Time:  8.6 seconds     Underlying rhythm:  SR ICD Dependent:  No       ICD Device Measurements Right Ventricle:  Amplitude: 11.5 mV, Impedance: 703 ohms, Threshold: 0.5 V at 0.4 msec Shock Impedance: 63/87 ohms   Episodes Coumadin:  Yes Shock:  0     ATP:  0     Nonsustained:  0     Ventricular Pacing:  <0.1%  Brady Parameters Mode DDI     Lower Rate Limit:  50     Upper Rate Limit 120 PAV 300     Sensed AV Delay:  300  Tachy Zones VF:  200     VT:  176     VT1:  176     Next Remote Date:  02/03/2010     Next Cardiology Appt Due:  07/17/2010 Tech Comments:  Lead impedance alert values reprogrammed. RV reprogrammed for chronic thresholds.  Carelink transmissions every 3 months.  ROV 7/12 with Dr. Ladona Ridgel in [place]. Altha Harm, LPN  November 02, 2009 11:33 AM  MD Comments:  Agree with above.  Impression & Recommendations:  Problem # 1:  AUTOMATIC IMPLANTABLE CARDIAC DEFIBRILLATOR SITU (ICD-V45.02) His device is working normally today.  Will continue to followup in several months.  Problem # 2:  VENTRICULAR FIBRILLATION ARREST,HX OF (ICD-427.41) He has had no recurrent ICD shocks. His updated medication list for this problem includes:    Diltiazem Hcl Coated Beads 240 Mg Cp24 (Diltiazem hcl coated beads) .Marland Kitchen... Take 1 capsule by mouth once a day    Amiodarone Hcl 200 Mg Tabs (Amiodarone hcl) .Marland Kitchen... 1 daily by mouth    Coumadin 4 Mg Tabs (Warfarin sodium) .Marland Kitchen... Takes 6mg  tues and sat    Coumadin  4 Mg Tabs (Warfarin sodium) .Marland Kitchen... 8 mg  monday,wed, thurs,friday,sunday    Metoprolol Succinate 100 Mg Xr24h-tab (Metoprolol succinate) .Marland Kitchen... 1 1/2 by mouth  daily  Problem # 3:  ATRIAL FIBRILLATION (ICD-427.31) His fibrillation appears to be well controlled on low dose amiodarone. His updated medication list for this problem includes:    Amiodarone Hcl 200 Mg Tabs (Amiodarone hcl) .Marland Kitchen... 1 daily by mouth    Coumadin 4 Mg Tabs (Warfarin sodium) .Marland Kitchen... Takes 6mg  tues and sat    Coumadin 4 Mg Tabs (Warfarin sodium) .Marland Kitchen... 8 mg  monday,wed, thurs,friday,sunday  Metoprolol Succinate 100 Mg Xr24h-tab (Metoprolol succinate) .Marland Kitchen... 1 1/2 by mouth  daily  Patient Instructions: 1)  Your physician recommends that you continue on your current medications as directed. Please refer to the Current Medication list given to you today. 2)  Your physician wants you to follow-up in:  07/2010  You will receive a reminder letter in the mail two months in advance. If you don't receive a letter, please call our office to schedule the follow-up appointment.

## 2010-02-15 NOTE — Letter (Signed)
Summary: Implantable Device Instructions  Architectural technologist at [address]   [place], [place] [address]   Phone: [phone]  Fax: [phone]      Implantable Device Instructions  You are scheduled for:  _____ Permanent Transvenous Pacemaker _____ Implantable Cardioverter Defibrillator _____ Implantable Loop Recorder __x___ Generator Change  on July 6 at 8:30 am  with Dr. Ladona Ridgel.  1.  Please arrive at the Short Stay Center at [facility] at 6:30am  on the day of your procedure.  2.  Do not eat or drink the night before your procedure.  3.  Complete lab work on July 1 at [facility] in [place] office.    4.  Take your last dose of Coumadin on July 5.  Please come into [place] office and have INR checked July 1 at 9:00am  5.  Plan for an overnight stay.  Bring your insurance cards and a list of your medications.  6.  Wash your chest and neck with antibacterial soap (any brand) the evening before and the morning of your procedure.  Rinse well.  *If you have ANY questions after you get home, please call the office [phone].  *Every attempt is made to prevent procedures from being rescheduled.  Due to the nauture of Electrophysiology, rescheduling can happen.  The physician is always aware and directs the staff when this occurs.

## 2010-02-21 ENCOUNTER — Ambulatory Visit (INDEPENDENT_AMBULATORY_CARE_PROVIDER_SITE_OTHER): Payer: BC Managed Care – PPO

## 2010-02-21 ENCOUNTER — Encounter: Payer: Self-pay | Admitting: Internal Medicine

## 2010-02-21 DIAGNOSIS — I4891 Unspecified atrial fibrillation: Secondary | ICD-10-CM

## 2010-02-21 DIAGNOSIS — Z7901 Long term (current) use of anticoagulants: Secondary | ICD-10-CM

## 2010-02-21 DIAGNOSIS — I82409 Acute embolism and thrombosis of unspecified deep veins of unspecified lower extremity: Secondary | ICD-10-CM

## 2010-02-21 DIAGNOSIS — Z5181 Encounter for therapeutic drug level monitoring: Secondary | ICD-10-CM

## 2010-02-27 ENCOUNTER — Encounter (INDEPENDENT_AMBULATORY_CARE_PROVIDER_SITE_OTHER): Payer: Self-pay | Admitting: *Deleted

## 2010-03-09 NOTE — Cardiovascular Report (Signed)
Summary: Office Visit Remote   Office Visit Remote   Imported By: Roderic Ovens 03/01/2010 14:55:47  _____________________________________________________________________  External Attachment:    Type:   Image     Comment:   External Document

## 2010-03-09 NOTE — Letter (Signed)
Summary: Remote Device Check  [facility], Main Office  [address]   [place], [place] [address]   Phone: [phone]  Fax: [phone]     February 27, 2010 MRN: [id number]   Craig Lindsey [address] [place], [place]  [address]   Dear [name],   Your remote transmission was recieved and reviewed by your physician.  All diagnostics were within normal limits for you.  __X___Your next transmission is scheduled for:  05-12-2010.  Please transmit at any time this day.  If you have a wireless device your transmission will be sent automatically.   Sincerely,  Vella Kohler

## 2010-03-11 ENCOUNTER — Encounter: Payer: Self-pay | Admitting: Internal Medicine

## 2010-03-16 ENCOUNTER — Ambulatory Visit (INDEPENDENT_AMBULATORY_CARE_PROVIDER_SITE_OTHER): Payer: BC Managed Care – PPO | Admitting: Internal Medicine

## 2010-03-16 ENCOUNTER — Encounter: Payer: Self-pay | Admitting: Internal Medicine

## 2010-03-16 ENCOUNTER — Other Ambulatory Visit: Payer: Self-pay | Admitting: Internal Medicine

## 2010-03-16 DIAGNOSIS — E1140 Type 2 diabetes mellitus with diabetic neuropathy, unspecified: Secondary | ICD-10-CM | POA: Insufficient documentation

## 2010-03-16 DIAGNOSIS — M1710 Unilateral primary osteoarthritis, unspecified knee: Secondary | ICD-10-CM | POA: Insufficient documentation

## 2010-03-16 DIAGNOSIS — E1149 Type 2 diabetes mellitus with other diabetic neurological complication: Secondary | ICD-10-CM

## 2010-03-16 DIAGNOSIS — I4891 Unspecified atrial fibrillation: Secondary | ICD-10-CM

## 2010-03-16 DIAGNOSIS — Z7901 Long term (current) use of anticoagulants: Secondary | ICD-10-CM

## 2010-03-16 DIAGNOSIS — Z5181 Encounter for therapeutic drug level monitoring: Secondary | ICD-10-CM

## 2010-03-16 DIAGNOSIS — I10 Essential (primary) hypertension: Secondary | ICD-10-CM

## 2010-03-16 DIAGNOSIS — E669 Obesity, unspecified: Secondary | ICD-10-CM

## 2010-03-16 LAB — CONVERTED CEMR LAB: Prothrombin Time: 35.6 s

## 2010-03-16 LAB — RENAL FUNCTION PANEL
BUN: 17 mg/dL (ref 6–23)
CO2: 26 mEq/L (ref 19–32)
Chloride: 104 mEq/L (ref 96–112)
GFR: 75.36 mL/min (ref >60.00)

## 2010-03-21 ENCOUNTER — Ambulatory Visit: Payer: BC Managed Care – PPO

## 2010-03-24 NOTE — Assessment & Plan Note (Signed)
Summary: protime/tsc    ANTICOAGULATION RECORD PREVIOUS REGIMEN & LAB RESULTS Anticoagulation Diagnosis:  Deep venous thrombosis on  02/04/2009 Previous INR Goal Range:  2.5-3.5 on  02/04/2009 Previous INR:  3.7 on  02/21/2010 Previous Coumadin Dose(mg):  8mg  daily, 10 mg wed on  10/14/2009 Previous Regimen:  8mg  qd, 10mg  every Wed on  02/21/2010 Previous Coagulation Comments:  . on  08/10/2009  NEW REGIMEN & LAB RESULTS Current INR: 3.0 Current Coumadin Dose(mg): 8mg  qd, 10mg  every Wed  Regimen: 8mg  qd, 10mg  every Wed   Provider: Katheen Aslin Repeat testing in: 4 weeks Dose has been reviewed with patient or caretaker during this visit. Reviewed by: Celso Sickle  Anticoagulation Visit Questionnaire Coumadin dose missed/changed:  No Abnormal Bleeding Symptoms:  No  Any diet changes including alcohol intake, vegetables or greens since the last visit:  No Any illnesses or hospitalizations since the last visit:  No Any signs of clotting since the last visit (including chest discomfort, dizziness, shortness of breath, arm tingling, slurred speech, swelling or redness in leg):  No  MEDICATIONS METOPROLOL SUCCINATE 100 MG XR24H-TAB (METOPROLOL SUCCINATE) 1 1/2 by mouth  daily SPIRONOLACTONE 50 MG TABS (SPIRONOLACTONE) Take 1 tablet by mouth twice a day POTASSIUM CHLORIDE CRYS CR 20 MEQ TBCR (POTASSIUM CHLORIDE CRYS CR) Take 1 tablet by mouth once a day DILTIAZEM HCL COATED BEADS 240 MG CP24 (DILTIAZEM HCL COATED BEADS) Take 1 capsule by mouth once a day LASIX 40 MG TABS (FUROSEMIDE) Take 1 tablet by mouth once a day METFORMIN HCL 1000 MG  TABS (METFORMIN HCL) 1 tab by mouth two times a day AMIODARONE HCL 200 MG  TABS (AMIODARONE HCL) 1 daily by mouth GLIPIZIDE 10 MG XR24H-TAB (GLIPIZIDE) 1 tab daily COUMADIN 4 MG TABS (WARFARIN SODIUM) use as directed MULTIVITAMINS   TABS (MULTIPLE VITAMIN) 1 daily by mouth * KNEE HIGH COMPRESSION HOSE   20-30MMHG apply daily and remove at  bedtime    Laboratory Results   Blood Tests   Date/Time Received: March 16, 2010 9:07 AM Date/Time Reported: March 16, 2010 9:07 AM  PT: 35.6 s   (Normal Range: 10.6-13.4)  INR: 3.0   (Normal Range: 0.88-1.12   Therap INR: 2.0-3.5)

## 2010-03-24 NOTE — Assessment & Plan Note (Signed)
Summary: 6 MONTH FOLLOW UP/[facility]H   Vital Signs:  Patient profile:   50 year old male Weight:      421 pounds Temp:     98.2 degrees F oral Pulse rate:   79 / minute Pulse rhythm:   regular BP sitting:   153 / 85  (left arm) Cuff size:   large  Vitals Entered By: Mervin Hack CMA Duncan Dull) (March 16, 2010 8:31 AM) CC: follow-up   History of Present Illness: doing okay  Still gaining weight--or at least not losing Considering bariatric surgery again Dr Ezzard Standing has spoken to him about that---seen for umbilical hernia Colon cancer now 3 years out  Still not back to work Limited with his knees--can't walk more than 10 minutes Hasn't been taking any meds Left knee is worse now but both are bad  checking sugars  ~3 days per week Fasting 160 or so May be less at other times No sig hypoglycemic reactions Still with neuropathy in feet--no trouble walking though  No chest pain No SOB No palpitations Slight edema  Allergies: No Known Drug Allergies  Past History:  Past medical, surgical, family and social histories (including risk factors) reviewed for relevance to current acute and chronic problems.  Past Medical History: Reviewed history from 05/31/2009 and no changes required. Diabetes mellitus, type II Hyperlipidemia Hypertension Ventricular tachycardia--12/04-----------------------------------------Dr Revonda Standard fibrillation Sleep apnea Obesity Hemorrhoids 1/09 St III Colon cancer-------------------------------------------------------Dr Verdis Frederickson, D. Newman Oxaliplatin neuropathy DVT RLE 7/09, Tx Arixtra Complex L renal cyst  Past Surgical History: Reviewed history from 05/31/2009 and no changes required. CHOLECYSTECTOMY, LAPAROSCOPIC, HX OF (ICD-V45.79) UMBILICAL HERNIORRHAPHY, HX OF (ICD-V45.89) IMPLANTATION OF DEFIBRILLATOR, HX OF (ICD-V45.02) SIGMOID COLECTOMY 1/09 - D. Ezzard Standing, MD (Colon cancer)  Family History: Reviewed history from  05/31/2009 and no changes required. Dad with DM 1 brother 2 sisters No CAD or HTN in family DM in Dad and mat GM No prostate cancer colon cancer-aunt  Social History: Reviewed history from 05/31/2009 and no changes required. 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   Neck:  supple, no masses, no thyromegaly, and no cervical lymphadenopathy.   Lungs:  normal respiratory effort, no intercostal retractions, no accessory muscle use, and normal breath sounds.   Heart:  normal rate, regular rhythm, no murmur, and no gallop.   Pulses:  1+ in feet Extremities:  trace edema Skin:  no suspicious lesions and no ulcerations.   venous stasis changes on calves Psych:  normally interactive, good eye contact, not anxious appearing, and not depressed appearing.    Diabetes Management Exam:    Foot Exam (with socks and/or shoes not present):       Sensory-Pinprick/Light touch:          Left medial foot (L-4): diminished          Left dorsal foot (L-5): diminished          Left lateral foot (S-1): diminished          Right medial foot (L-4): diminished          Right dorsal foot (L-5): diminished          Right lateral foot (S-1): diminished       Inspection:  Left foot: normal          Right foot: normal       Nails:          Left foot: normal          Right foot: normal   Impression & Recommendations:  Problem # 1:  DIABETES MELLITUS, TYPE II, WITH NEUROLOGICAL COMPLICATIONS (ICD-250.60) Assessment Unchanged  hopefully still acceptable will check labs  His updated medication list for this problem includes:    Metformin Hcl 1000 Mg Tabs (Metformin hcl) .Marland Kitchen... 1 tab by mouth two times a day    Glipizide 10 Mg Xr24h-tab (Glipizide) .Marland Kitchen... 1 tab  daily  Labs Reviewed: Creat: 1.02 (07/16/2009)     Last Eye Exam: no retinopathy (07/17/2007) Reviewed HgBA1c results: 7.6 (07/23/2009)  6.6 (09/25/2008)  Orders: Venipuncture (16109) TLB-Renal Function Panel (80069-RENAL) TLB-A1C / Hgb A1C (Glycohemoglobin) (83036-A1C)  Problem # 2:  HYPERTENSION (ICD-401.9) Assessment: Unchanged still not optimal consider ACEI appropriate medical candidate for bariatric surgery---will hold off on more meds for now  His updated medication list for this problem includes:    Metoprolol Succinate 100 Mg Xr24h-tab (Metoprolol succinate) .Marland Kitchen... 1 1/2 by mouth  daily    Spironolactone 50 Mg Tabs (Spironolactone) .Marland Kitchen... Take 1 tablet by mouth twice a day    Diltiazem Hcl Coated Beads 240 Mg Cp24 (Diltiazem hcl coated beads) .Marland Kitchen... Take 1 capsule by mouth once a day    Lasix 40 Mg Tabs (Furosemide) .Marland Kitchen... Take 1 tablet by mouth once a day  BP today: 153/85 Prior BP: 158/82 (11/02/2009)  Prior 10 Yr Risk Heart Disease: Not enough information (06/19/2006)  Labs Reviewed: K+: 4.4 (07/16/2009) Creat: : 1.02 (07/16/2009)   Chol: 185 (07/23/2009)   HDL: 36.90 (07/23/2009)   LDL: DEL (03/08/2007)   TG: 210.0 (07/23/2009)  Problem # 3:  OSTEOARTHRITIS, KNEES, BILATERAL (ICD-715.96) Assessment: Deteriorated try tylenol for now discussed water exercises  Problem # 4:  OBESITY (ICD-278.00) Assessment: Comment Only really could benefit from bariatric surgery  Complete Medication List: 1)  Metoprolol Succinate 100 Mg Xr24h-tab (Metoprolol succinate) .Marland Kitchen.. 1 1/2 by mouth  daily 2)  Spironolactone 50 Mg Tabs (Spironolactone) .... Take 1 tablet by mouth twice a day 3)  Potassium Chloride Crys Cr 20 Meq Tbcr (Potassium chloride crys cr) .... Take 1 tablet by mouth once a day 4)  Diltiazem Hcl Coated Beads 240 Mg Cp24 (Diltiazem hcl coated beads) .... Take 1 capsule by mouth once a day 5)  Lasix 40 Mg Tabs (Furosemide) .... Take 1 tablet by mouth once a day 6)   Metformin Hcl 1000 Mg Tabs (Metformin hcl) .Marland Kitchen.. 1 tab by mouth two times a day 7)  Amiodarone Hcl 200 Mg Tabs (Amiodarone hcl) .Marland Kitchen.. 1 daily by mouth 8)  Glipizide 10 Mg Xr24h-tab (Glipizide) .Marland Kitchen.. 1 tab daily 9)  Coumadin 4 Mg Tabs (Warfarin sodium) .... Use as directed 10)  Multivitamins Tabs (Multiple vitamin) .Marland Kitchen.. 1 daily by mouth 11)  Knee High Compression Hose 20-32mmhg  .... Apply daily and remove at bedtime  Patient Instructions: 1)  Please try tylenol regularly for knee pain---esp before you try to walk 2)  Please schedule a follow-up appointment in 6 months for physical Prescriptions: KNEE HIGH COMPRESSION HOSE   20-30MMHG apply daily and remove at bedtime  #2 pair x 1   Entered and Authorized by:   Cindee Salt MD   Signed by:   Cindee Salt MD on 03/16/2010   Method used:  Print then Give to Patient   RxID:   [id number]    Orders Added: 1)  Est. Patient Level IV [64403] 2)  Venipuncture [36415] 3)  TLB-Renal Function Panel [80069-RENAL] 4)  TLB-A1C / Hgb A1C (Glycohemoglobin) [83036-A1C]    Current Allergies (reviewed today): No known allergies

## 2010-03-29 NOTE — Letter (Addendum)
Summary: [facility]   Imported By: Maryln Gottron 03/24/2010 15:56:41  _____________________________________________________________________  External Attachment:    Type:   Image     Comment:   External Document  Appended Document: [facility] planning gastric bypass bariatric procedure

## 2010-03-31 LAB — POCT I-STAT, CHEM 8
BUN: 16 mg/dL (ref 6–23)
Hemoglobin: 18 g/dL — ABNORMAL HIGH (ref 13.0–17.0)
Sodium: 139 mEq/L (ref 135–145)
TCO2: 27 mmol/L (ref 0–100)

## 2010-04-03 LAB — GLUCOSE, CAPILLARY
Glucose-Capillary: 212 mg/dL — ABNORMAL HIGH (ref 70–99)
Glucose-Capillary: 230 mg/dL — ABNORMAL HIGH (ref 70–99)

## 2010-04-03 LAB — SURGICAL PCR SCREEN: Staphylococcus aureus: POSITIVE — AB

## 2010-04-04 LAB — GLUCOSE, CAPILLARY: Glucose-Capillary: 199 mg/dL — ABNORMAL HIGH (ref 70–99)

## 2010-04-07 ENCOUNTER — Encounter (HOSPITAL_BASED_OUTPATIENT_CLINIC_OR_DEPARTMENT_OTHER): Payer: BC Managed Care – PPO | Admitting: Oncology

## 2010-04-07 ENCOUNTER — Telehealth: Payer: Self-pay | Admitting: *Deleted

## 2010-04-07 ENCOUNTER — Other Ambulatory Visit: Payer: Self-pay | Admitting: Oncology

## 2010-04-07 DIAGNOSIS — Z7901 Long term (current) use of anticoagulants: Secondary | ICD-10-CM

## 2010-04-07 DIAGNOSIS — O2230 Deep phlebothrombosis in pregnancy, unspecified trimester: Secondary | ICD-10-CM

## 2010-04-07 DIAGNOSIS — C187 Malignant neoplasm of sigmoid colon: Secondary | ICD-10-CM

## 2010-04-07 DIAGNOSIS — I82409 Acute embolism and thrombosis of unspecified deep veins of unspecified lower extremity: Secondary | ICD-10-CM

## 2010-04-07 DIAGNOSIS — Z5181 Encounter for therapeutic drug level monitoring: Secondary | ICD-10-CM

## 2010-04-07 DIAGNOSIS — K7689 Other specified diseases of liver: Secondary | ICD-10-CM

## 2010-04-07 DIAGNOSIS — Z86718 Personal history of other venous thrombosis and embolism: Secondary | ICD-10-CM

## 2010-04-07 DIAGNOSIS — C189 Malignant neoplasm of colon, unspecified: Secondary | ICD-10-CM

## 2010-04-07 LAB — CEA: CEA: 1.3 ng/mL (ref 0.0–5.0)

## 2010-04-07 NOTE — Telephone Encounter (Signed)
INR monitoring enrollment  

## 2010-04-13 ENCOUNTER — Ambulatory Visit: Payer: BC Managed Care – PPO

## 2010-04-19 ENCOUNTER — Other Ambulatory Visit: Payer: Self-pay | Admitting: Internal Medicine

## 2010-04-28 ENCOUNTER — Ambulatory Visit (INDEPENDENT_AMBULATORY_CARE_PROVIDER_SITE_OTHER): Payer: BC Managed Care – PPO | Admitting: Internal Medicine

## 2010-04-28 DIAGNOSIS — Z5181 Encounter for therapeutic drug level monitoring: Secondary | ICD-10-CM

## 2010-04-28 DIAGNOSIS — I80299 Phlebitis and thrombophlebitis of other deep vessels of unspecified lower extremity: Secondary | ICD-10-CM

## 2010-04-28 DIAGNOSIS — Z7901 Long term (current) use of anticoagulants: Secondary | ICD-10-CM

## 2010-04-28 DIAGNOSIS — O2230 Deep phlebothrombosis in pregnancy, unspecified trimester: Secondary | ICD-10-CM

## 2010-05-12 ENCOUNTER — Ambulatory Visit (INDEPENDENT_AMBULATORY_CARE_PROVIDER_SITE_OTHER): Payer: BC Managed Care – PPO | Admitting: *Deleted

## 2010-05-12 ENCOUNTER — Ambulatory Visit: Payer: BC Managed Care – PPO

## 2010-05-12 ENCOUNTER — Other Ambulatory Visit: Payer: Self-pay | Admitting: Internal Medicine

## 2010-05-12 DIAGNOSIS — I5020 Unspecified systolic (congestive) heart failure: Secondary | ICD-10-CM

## 2010-05-12 DIAGNOSIS — I428 Other cardiomyopathies: Secondary | ICD-10-CM

## 2010-05-16 ENCOUNTER — Ambulatory Visit (INDEPENDENT_AMBULATORY_CARE_PROVIDER_SITE_OTHER): Payer: BC Managed Care – PPO | Admitting: Internal Medicine

## 2010-05-16 DIAGNOSIS — I82409 Acute embolism and thrombosis of unspecified deep veins of unspecified lower extremity: Secondary | ICD-10-CM

## 2010-05-16 DIAGNOSIS — Z7901 Long term (current) use of anticoagulants: Secondary | ICD-10-CM

## 2010-05-16 DIAGNOSIS — Z5181 Encounter for therapeutic drug level monitoring: Secondary | ICD-10-CM

## 2010-05-16 NOTE — Telephone Encounter (Signed)
Addended by: Liane Comber on: 05/16/2010 07:56 AM   Modules accepted: Orders

## 2010-05-22 NOTE — Progress Notes (Signed)
icd remote w/icm  

## 2010-05-31 NOTE — Progress Notes (Signed)
[facility] OFFICE NOTE   Craig Lindsey, Craig Lindsey                       MRN:          [id number]  DATE:06/29/2008                            DOB:          1960/06/19    Mr. Ferrante returns today for followup.  He is a very pleasant, middle-  aged, morbidly obese male with a history of ventricular fibrillation  arrest, atrial fibrillation, and morbid obesity, hypertension, and a  diagnosis of colon cancer.  He has undergone chemotherapy and colectomy.  The patient denies chest pain.  He denies shortness of breath.  He has  had no symptomatic AFib.  He did have DVT in his right leg which  resulted in leg swelling this has improved.  He had no other complaints  today.  He states that he has recently become unemployed and has been  working on finding a job.   PAST MEDICAL HISTORY:  Notable as noted above.  He also has diabetes and  sleep apnea.   FAMILY HISTORY:  Negative for coronary disease.   SOCIAL HISTORY:  The patient is now unemployed.  He is married.  He  denies tobacco or ethanol abuse.   REVIEW OF SYSTEMS:  Negative except as noted in the HPI except his  weight continues to go up.   PHYSICAL EXAMINATION:  GENERAL:  He is a pleasant, morbidly obese,  middle-aged man, in no acute distress.  VITAL SIGNS:  The blood pressure today was 168/90, the pulse was 80 and  regular, respirations were 18, weight was 412 pounds.  NECK:  No jugular  venous distention.  LUNGS:  Clear bilaterally to auscultation.  No wheezes, rales, or  rhonchi are demonstrated.  The breath sounds were distant.  CARDIOVASCULAR:  Regular rate and rhythm with normal S1 and S2.  The  heart sounds were distant as well.  PMI cannot be appreciated.  ABDOMEN:  Morbidly obese, nontender, nondistended.  EXTREMITIES:  Demonstrated trace peripheral edema bilaterally.   Interrogation of his defibrillator was carried out today demonstrating  a  Medtronic maximal P-waves were 1.2.  The R waves were 11.  The impedance  is 1700 in the atrium, 736 in the right ventricle, threshold in the  atrium was 601 volts in the right ventricle 1.2.  Battery voltage was  2.79 volts.  Underlying rhythm was sinus in the mid 70s.  Today, we  reprogrammed his defibrillator because of his high threshold in the  atrium to a DDI mode at a rate of 50.  His AV delay of course is at 300.  He does have 69,49 defibrillator lead which is working normally.  There  has been no intercurrent IC therapy since last we checked him and there  has been no AFib.   IMPRESSION:  1. Atrial fibrillation, maintaining sinus rhythm on amiodarone.  2. A history of ventricular tachycardia, ventricular fibrillation      arrest x2, status post implantable cardioverter-defibrillator      insertion.  3. Morbid obesity.  4. Colon cancer, status post chemotherapy.   DISCUSSION:  From her arrhythmia and cardiac perspective, the patient is  stable.  He  remains morbidly obese and has been unable to lose weight.  I have continued to encourage him on this.  He states that he is going  to go back to walking.  We will plan to see him back for ICD followup  with me in 1-year.  He will follow up in our office as previously noted.     Doylene Canning. Ladona Ridgel, MD  Electronically Signed    GWT/MedQ  DD: 06/29/2008  DT: 06/30/2008  Job #: [id number]   cc:   Karie Schwalbe, MD

## 2010-05-31 NOTE — Op Note (Signed)
Craig Lindsey, Craig Lindsey NO.:  [id number]   MEDICAL RECORD NO.:  [id number]          PATIENT TYPE:  INP   LOCATION:  2115                         FACILITY:  [facility]   PHYSICIAN:  Sandria Bales. Ezzard Standing, M.D.  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   PROCEDURES:  Laparoscopic converted to open sigmoid colectomy, rigid  sigmoidoscopy, repair of umbilical hernia with resection of navel skin.   SURGEON:  Sandria Bales. Ezzard Standing, M.D.   FIRST ASSISTANT:  Leonie Man, M.D.   ANESTHESIA:  General endotracheal.   BLOOD LOSS:  300 mL.   DRAINS:  None.  I did leave Telfa wicks in the wound.   INDICATIONS FOR SURGERY:  Mr. Sesay is a morbidly obese 50 year old  white male, a patient of Dr. Maudry Diego, who actually had seen me for  consideration of bariatric surgery but he noted some blood in stools. He  saw Dr. Stan Head and Dr. Leone Payor did a colonoscopy on January 21, 2007 and saw an approximately 8 cm circumferential tumor starting at 30  cm from anal verge.  Biopsy showed this to be an adenocarcinoma.  Mr.  Bilyeu sees Dr. Tillman Abide as his primary care doctor.   Mr. Brendle had a CT scan which raised the question of the nodule in the  right lobe of liver and saw a questionable 1 cm  lymph node in the base  of his mesentery near the aortic bifurcation, but otherwise there was no  evidence of any metastatic disease.  So he now comes for attempted  sigmoid colon resection.   The indications, potential complications surgery explained to the  patient.  Potential complications include but not limited to bleeding,  infection, bowel  leak, and recurrence of the cancer.  I will try to do  this laparoscopically but he understands because of his significant  obesity, he may be very difficult to do laparoscopically and may have a  majority of his operation done as an open procedure.  He understands he  could end up with a colostomy and he has significant other risks such as  heart disease  and pulmonary disease that could complicate surgery.   OPERATIVE NOTE:  Patient completed a mechanical and antibiotic bowel  prep at home and presented  to the operating room and underwent general  anesthetic.  He has a defibrillator in place which was turned off.  He  had his right arm tucked.  He had a Foley catheter in place, PAS  stockings are in place and he was given cefoxitin initiation procedure.   The abdomen shaved and prepped with Betadine solution as was the rectum.  He was placed in lithotomy.   A time-out was held identifying the patient and procedure.   First started in the right  upper quadrant were I put a 10 mm or 11 mm  OptiView Ethicon trocar.  I then placed a 5-mm in the right lower  quadrant 5-mm left lateral quadrant and did abdominal exploration.   I could see the left lobe liver was unremarkable.  Unfortunately right  lobe of liver was very much obstructed by scar tissue from his prior  cholecystectomy but I was able to see at least part of the liver and I  saw no nodules or masses on either lobe.  The anterior wall of the  stomach I could see was also unremarkable but the remainder of his bowel  really was covered with omentum.  I tried to get to see the sigmoid  colon but because of his weight of his bowels, the side of the  intestines it really was I had a very  limited view of his pelvic  cavity.  I did not see any obvious peritoneal mets, but again I had a  fairly limited view of what I could see.  I did reduce an incarcerated  umbilical hernia at initiation laparoscopic procedure.   After spending  probably 15-20 minutes trying to see what I could see  laparoscopic it was clear that I would not get much progress in this  proceeding this way and I converted to an open incision where I went  about 2-3 cm above the umbilicus.  He had again this chronic  incarcerated umbilical hernia which I reduced but plan to repair during  the abdominal closure and extended incision down almost to suprapubic  area.   Upon entering abdominal cavity I identified this tumor which was  probably the distal third of the sigmoid colon.   He was noted have extremely thickened mesentery which really made trying  to mobilize it somewhat difficult but I did get up where I thought I had  adequate length of mesentery.   I took down mesentery with a combination of the LigaSure and then when I  got down to the sigmoid colon vessels I did suture ligatures on this.   I never went to true retroperitoneum so I think I avoided the ureter  though I did get into that mesentery some.  So I went about 10 cm  proximal to the tumor.  I got about 5 cm distal to the tumor and  resected the mid then distal sigmoid colon.   I then opened the specimen at the bedside and had a good 4 to 5 cm  distal margin that I did not cut across any gross tumor.   I divided the proximal bowel with the GIA 75 stapler and divided the  distal bowel with a 60 mm angled stapler.   Mr. Vanderburg is also participating in the [facility] research protocol and  this was sent down as a fresh specimen with the tumor marked the colon  marked for the tumor size and for the [facility] set up.   I then irrigated the mesentery saw no further bleeding.  I cut out the  staple line both proximally and distally and did a hand sewn end-to-end  anastomosis with 2-0 silk suture.   I did inverting suture line on the back wall and [place] out to the  anterior wall.   I thought I had a tight anastomosis.  I could get one and a half  fingerbreadths through the  anastomosis.  Dr. Lurene Shadow broke scrub and went  below where he sigmoidoscoped the patient and saw he really  could not  get to the anastomosis.  I am guessing anastomosis probably 25-30 cm  from the anal verge, but upon entering the and insufflating the colon I  flooded the pelvis with saline and saw no bubbling and no evidence of  leak and the lining, the external serosal lining all looked viable.   I then returned after I did anastomosis.  I put a couple of buttressing  sutures on both sides.  I then returned the omentum and bowel to its  normal location, removed all the laps, made sure lap count was correct  before started closing the abdomen.   I then made sure the NG tube was in good position.  I felt no other mass  or nodularity.  I did try to feel for the questionable nodule in the  mesentery of the colon but there is no way to feel this with any kind of  practical way.   I then closed his abdomen two running double-stranded #1 PDS suture.  I  put about five or six interrupted single strand PDS sutures.  I tried to  put a suture throw every four or five run throws of the running PDS.  I  then irrigated subcutaneous tissues with a liter of saline.  I closed  the skin with staples and placed Telfa wicks in the wound and closed the  laparoscopic ports.   He tolerated procedure well.  Because of his known heart disease,  diabetes, sleep apnea, I will put him in an intensive care unit  overnight. His final pathology is pending at the time of dictation.      Sandria Bales. Ezzard Standing, M.D.  Electronically Signed     DHN/MEDQ  D:  02/13/2007  T:  02/14/2007  Job:  [id number]   cc:   Karie Schwalbe, MD  Iva Boop, MD,FACG  Doylene Canning. Ladona Ridgel, MD

## 2010-05-31 NOTE — Discharge Summary (Signed)
NAMEBARNELL, SHIEH              ACCOUNT NO.:  [id number]   MEDICAL RECORD NO.:  [id number]          PATIENT TYPE:  OBV   LOCATION:  3731                         FACILITY:  [facility]   PHYSICIAN:  Valerie A. Felicity Coyer, MDDATE OF BIRTH:  09-12-1960   DATE OF ADMISSION:  08/08/2007  DATE OF DISCHARGE:  08/09/2007                               DISCHARGE SUMMARY   DISCHARGE DIAGNOSES:  1. Right lower extremity deep vein thrombosis with phlebitis.  2. Hypertension.  3. Hyperlipidemia.  4. Diabetes type 2.  5. History of atrial fibrillation, on chronic Coumadin.  6. Colon cancer, status post colectomy in January 2009, on      chemotherapy last round August 07, 2007, with 3 more planned sets to      go over the next 6 weeks.  7. Incarcerated umbilical hernia, status post repair in January 2009.  8. Obstructive sleep apnea, on CPAP.  9. Morbid obesity.  10.History of cardiac arrest in November 2007, status post implantable      cardioverter-defibrillator placement.  11.History of ventricular tachycardia.   COURSE OF HOSPITALIZATION:  1. Right lower extremity DVT.  The patient was admitted on August 08, 2007, with a chief complaint of right lower extremity pain and      swelling.  He presented to his primary care Vernesha Talbot on the morning      of admission with a 4-day history of this pain and swelling.      Outpatient study revealed right lower extremity DVT, which was      performed at [facility].  He apparently had a subtherapeutic      INR of 1.8 on Monday, August 05, 2007.  He was admitted for further      evaluation and treatment.  2. Right lower extremity DVT with phlebitis.  The patient was      admitted.  He was seen in consultation by Dr. Jillyn Hidden B. Sherrill of      Hematology/Oncology.  His feeling is that it is difficult to say      that that he is truly __________Coumadin, as he has been in      therapeutic when checked at the cancer center.  He felt that his      risk  factors for DVT include obesity and chemotherapy.  His      recommendations for an anticoagulation standpoint at this time are      to hold Coumadin and to anticoagulate the patient with either      Lovenox or Arixtra.  Due to the patient's habitus, Arixtra is more      easily dosed, and we have chosen to discharge the patient to home      on Arixtra.  He is to follow up as scheduled for his next cycle of      chemotherapy at the cancer center.   DISCHARGE MEDICATIONS:  1. Aldactone 50 mg p.o. b.i.d.  2. Metoprolol 50 mg p.o. t.i.d.  3. Diltiazem 240 mg p.o. daily.  4. Lasix 40 mg p.o. daily.  5. KCl 20 mEq p.o. daily.  6. Amiodarone 200 mg p.o. daily.  7. Metformin 1000 mg p.o. b.i.d.  8. Glipizide 2.5 mg p.o. daily.  9. Arixtra 10 mg subcu daily.   DISPOSITION:  He will be discharged to home.   FOLLOWUP:  He is instructed to follow up with Dr. Alphonsus Sias next week in  the office and with Dr. Truett Perna, who is scheduled for his chemotherapy.  He is also instructed to keep his right leg elevated as much as possible  and to contact Dr. Alphonsus Sias should he develop increased swelling,  increased redness of the right leg, or increased pain in that leg, or  should he develop temperature greater than 101.  He is instructed to go  directly to the ER should he develop chest pain or shortness of breath.   PERTINENT LABORATORY DATA:  At the time of discharge, INR 2.3,  __________ 10, __________ 1.16.  Sodium 131, potassium 4.0, hemoglobin  13.9, and hematocrit 41.1.   Greater than 30 minutes was spent on discharge planning.      Sandford Craze, NP      Raenette Rover. Felicity Coyer, MD  Electronically Signed    MO/MEDQ  D:  08/09/2007  T:  08/09/2007  Job:  [id number]   cc:   Karie Schwalbe, MD  Leighton Roach. Truett Perna, M.D.

## 2010-05-31 NOTE — Discharge Summary (Signed)
Craig Lindsey, Craig Lindsey              ACCOUNT NO.:  [id number]   MEDICAL RECORD NO.:  [id number]          PATIENT TYPE:  INP   LOCATION:  5153                         FACILITY:  [facility]   PHYSICIAN:  Sandria Bales. Ezzard Standing, M.D.  DATE OF BIRTH:  Apr 14, 1960   DATE OF ADMISSION:  02/13/2007  DATE OF DISCHARGE:  02/19/2007                               DISCHARGE SUMMARY   DATE OF ADMISSION:  February 13, 2007.   DATE OF DISCHARGE:  February 19, 2007.   DISCHARGE DIAGNOSES:  1. Adenocarcinoma of the sigmoid colon approximately 6.5 centimeters      in length, a T3 tumor with 1 of 12 nodes positive.  2. History of cardiac arrest with defibrillator.  3. History of atrial fibrillation on Coumadin.  4. Hypertension.  5. Sleep apnea on continuous positive airway pressure.  6. Chronic venostasis disease of lower extremities.  7. Arthritic complaints of his knees.  8. He has noninsulin dependent diabetes mellitus.  9. Chronically incarcerated umbilical hernia   OPERATIONS PERFORMED:  Patient had a laparoscopic converted to open  sigmoid colectomy, rigid sigmoidoscopy, and repair of umbilical hernia  on the 28th of January, 2009.   HISTORY OF ILLNESS:  Craig Lindsey is a pleasant 50 year old white male who  is a patient of Dr. Tillman Abide who has actually seeing me for  evaluation of bariatric surgery.  While he was being evaluated he was  noted to have blood in the stool and he underwent a colonoscopy by Dr.  Stan Head on the 5th of January, 2009.  Dr. Leone Payor saw an  approximate 8 cm long circumferential tumor from approximately 30-38 cm  from the anal verge.  Biopsy showed an adenocarcinoma and he came to me  for discussion of surgical resection of his carcinoma.   He also underwent a CT scan that showed an 8 mm attenuated lesion in the  anterior segment of the right lobe of his liver and an approximately 10  mm questionable lymph node at the base of his mesentery due to aortic  bifurcation.   PAST HISTORY:  Significant for multiple things.  1. He had a lap chole by Dr. Dominga Ferry November 24, 2005.  2. He had a cardiac arrest in 2004, had a defibrillator placed and has      been followed by Dr. Sharrell Ku.  3. He has a history of atrial fibrillation for which he has been      anticoagulated on Coumadin.  This was stopped 1 week prior to his      hospitalization.  4. He also has a history of sleep apnea, on CPAP.  5. He is noninsulin dependent diabetic.  6. He has had arthritic complaints of his knees.  7. He was noted to have an umbilical hernia on a CT scan.  8. He has hypertension.   Patient completed a mechanical antibiotic bowel prep at home and he  presented to the hospital.  Of note, he is morbidly obese with a weight  of approximately 401 pounds and a BMI of 50.   He completed a mechanical antibiotic bowel prep  at home.  On the day of  admission he was taken to the operating room.  I attempted a  laparoscopic exploration but really because of his size it was really  fairly limited.  I ended up doing an open sigmoid colectomy.  I found an  incarcerated umbilical hernia with omentum which I also repaired at the  same time and we did a sigmoidoscopy at the end of the procedure just to  verify the anastomosis.   I asked Dr. Ladona Ridgel, of [facility], to follow him with me postop  because of his significant cardiac history.   The patient postop his hemoglobin was 14.3, his white blood count  15,900, his sodium 143, potassium 4.6, chloride 105, CO2 of 25, BUN is  8, creatinine 1.   I did obtain a CEA on him which was 1.4, but postop he actually did well  with the NG tube for 3 days, pulled on the 4th postoperative day,  started on clear liquids.  He is now 6 days postop, he is having bowel  movements, he is afebrile.  His incision, though it had some redness, is  getting somewhat better.  I am going to leave the staples in for another  week because of his  size.   His final pathology, there is not a typed report in the chart, reveals a  colonic adenocarcinoma well differentiated 6.5 cm in size, carcinoma  extends through the muscularis propria into the subserosa, this was a T3  tumor, there was 1 of 12 nodes positive, this was a T1 tumor.  He also  mentioned there is single focus of soft tissue metastasis, this report  is somewhat unclear but will speak to Dr. Colonel Bald about this.   As a separate specimen I did send the hernia sac which was benign.   DISCHARGE INSTRUCTIONS:  We will restart his Coumadin today.  He is to  check with the [facility] within a week to check his coags.   DISCHARGE MEDS:  Will be his medicines on admission, which were:  1. Metformin 1000 mg daily.  2. Lasix 40 mg daily.  3. Spironolactone 50 mg twice a day.  4. Taztia XL 240 mg.  5. Potassium.  6. Metoprolol.  7. Centrum.  8. Warfarin, he takes 8 mg every day except for Tuesday, Thursday he      takes 6 mg.   He did see Dr. Mancel Bale in consultation while in the hospital.  Dr.  Truett Perna will arrange followup.  We talked a little bit about Port-A-Cath placement, the risks of Port-A-  Cath which include bleeding, infection,  pneumothorax, problems with his defibrillator machine; I think he  understands this.  This can be scheduled after Dr. Truett Perna gives his opinion.   DISCHARGE CONDITION:  Good.      Sandria Bales. Ezzard Standing, M.D.  Electronically Signed     DHN/MEDQ  D:  02/19/2007  T:  02/19/2007  Job:  [id number]   cc:   Karie Schwalbe, MD  Doylene Canning. Ladona Ridgel, MD  Iva Boop, MD,FACG  Leighton Roach Truett Perna, M.D.

## 2010-05-31 NOTE — Consult Note (Signed)
NAMEQUINTON, Craig Lindsey              ACCOUNT NO.:  [id number]   MEDICAL RECORD NO.:  [id number]          PATIENT TYPE:  INP   LOCATION:  5153                         FACILITY:  [facility]   PHYSICIAN:  Jesse Sans. Wall, MD, FACCDATE OF BIRTH:  [birth year]   DATE OF CONSULTATION:  DATE OF DISCHARGE:                                 CONSULTATION   REASON FOR CONSULTATION:  Postoperative tachycardia.   HISTORY OF PRESENT ILLNESS:  Craig Lindsey is a delightful 50 year old  married white male well known to [facility].  He is followed by  Dr. Lewayne Bunting for a history of a secondary ventricular fibrillation  arrest in 2004 when he was extremely ill at [facility] with pneumonia  and MRSA.   He is status post an AICD at that time.  Catheterization showed no  significant coronary artery disease.  Echo showed normal left  ventricular systolic function and EF 60-65%.   He also has a history of paroxysmal atrial fibrillation treated with  amiodarone in the past.   Postoperatively, metoprolol was ordered at 5 mg IV q.6 h..  Today on  rounds he was noted to be tachycardic by Dr. Jamey Ripa and an EKG was  obtained which showed sinus tachycardia at a rate of 112 beats per  minute.  There were no acute changes.  He has low voltage but unchanged  from before.  He has a mild left axis shift.   He felt his heart beating fast.  Since surgery he has not slept well and  he has not been able to wear his CPAP.  He has NG tube in.   PAST MEDICAL HISTORY:  In addition to the above he was diagnosed with  sigmoid colon cancer after passing some blood per rectum by Dr. Stan Head.  He subsequently had laparoscopic open sigmoid colectomy, rigid  sigmoidoscopy and repair of umbilical hernia that was incarcerated by  Dr. Ezzard Standing on February 11, 2007.  He has a history of morbid obesity.  History of type 2 diabetes, history of anticoagulation.   ALLERGIES:  He has no dye allergies.  There are no other  allergies.   CURRENT MEDS:  Are outlined in the chart.  Again, he is on metoprolol 5  mg IV q.6 h.   FAMILY AND SOCIAL HISTORY:  Are in previous records.  They have been  reviewed.   REVIEW OF SYSTEMS:  Other than the tachy palpitations he is stable from  a cardiac standpoint.   PHYSICAL EXAMINATION:  VITAL SIGNS:  Today his blood pressure is 142/90,  heart rate is currently about 100 and regular.  His temperature is 98.4,  respiratory rate 18, sats 95% on room air.  HEENT:  He is normocephalic, atraumatic.  He has an NG tube in.  He is  somewhat uncomfortable.  He is alert and oriented x3.  He is extremely  large.  Weight is well over 400 pounds.  LUNGS:  Clear to auscultation and percussion.  Carotid upstrokes were  equal bilaterally without obvious bruit.  There was no obvious JVD.  Thyroid is not enlarged.  HEART:  Reveals a poorly appreciated PMI.  He has a normal S1-S2 with a  rapid rate.  ABDOMEN:  Exam is distended and obese.  No active bowel sounds.  EXTREMITIES:  Reveal pneumatic cuffs in place.  No significant edema.  Pulses are intact.  NEUROLOGICAL:  Exam is intact.   LABORATORY DATA:  His potassium is 4.1, creatinine 0.93, blood sugar  196, hemoglobin 15.1 and stable.   ASSESSMENT:  Sinus tachycardia postoperatively.  This is multifactorial  mostly from pain, anxiety, and decrease in his AV nodal blocking and  heart rate slowing meds including diltiazem and metoprolol.   Since he is currently n.p.o., I would increase his metoprolol to 7.5 mg  IV now and then q.4 h. running.  I would only hold this if his heart  rate is less than or equal to 70 or systolic blood pressure is less than  100.   He will need to be anticoagulated once felt safe by surgery.  Since he  is in sinus rhythm this is not urgent.  We will follow along with you.      Thomas C. Daleen Squibb, MD, [facility]  Electronically Signed     TCW/MEDQ  D:  02/16/2007  T:  02/17/2007  Job:  [id number]   cc:    Sandria Bales. Ezzard Standing, M.D.  Doylene Canning. Ladona Ridgel, MD

## 2010-05-31 NOTE — Assessment & Plan Note (Signed)
 HEALTHCARE                         ELECTROPHYSIOLOGY OFFICE NOTE   Craig Lindsey, Craig Lindsey                       MRN:          [id number]  DATE:05/17/2007                            DOB:          Jan 03, 1961    HISTORY OF PRESENT ILLNESS:  [name] returns today for follow up.  He  is a very pleasant, morbidly obese middle aged man with the diagnosis of  a colon cancer who is on chemotherapy status post excision.  Also with  history of VF arrest in the past, history of atrial fibrillation and  rapid ventricular response and morbid obesity.  [name] returns today  for follow up.  He is mid way into his chemotherapy for his colon  cancer.  He denies palpitations.  He has not been particularly active,  admitted to being sedentary.   MEDICATIONS:  1. Warfarin 4 mg daily with 6 mg on Tuesdays and Thursdays.  2. Potassium.  3. Aldactone 50 mg twice daily.  4. Metoprolol ER 50 mg t.i.d.  5. Diltiazem 240 mg daily.  6. Furosemide 40 mg daily.  7. Amiodarone 200 mg daily.  8. Metformin 1 g b.i.d.  9. Glipizide 2.5 mg daily.   PHYSICAL EXAMINATION:  GENERAL:  He is a pleasant, obese, middle aged  man in no acute distress.  VITAL SIGNS:  Blood pressure 136/83, pulse 89 and regular, respirations  18, weight 352 pounds.  NECK:  No jugular venous distention.  LUNGS:  Clear bilateral to auscultation.  No wheezes, rales or rhonchi  present.  CARDIOVASCULAR:  Regular rate and rhythm with normal S1, S2.  ABDOMEN:  Obese, nontender, nondistended.  EXTREMITIES:  No edema.   IMPRESSION:  1. History of VF arrest status post ICD insertion.  2. Paroxysmal atrial fibrillation.  3. Morbid obesity.  4. Colon cancer, currently on chemotherapy.   DISCUSSION:  [name] is stable at the present time.  His heart is  maintaining sinus rhythm nicely.  He is not suffering any untoured  consequences of his chemotherapy.  We will plan to  see the patient back in the  office in several months.  He is instructed  to call us if he goes out of rhythm into atrial fibrillation.     Doylene Canning. Ladona Ridgel, MD  Electronically Signed    GWT/MedQ  DD: 05/17/2007  DT: 05/17/2007  Job #: [id number]

## 2010-05-31 NOTE — Assessment & Plan Note (Signed)
[facility]                         ELECTROPHYSIOLOGY OFFICE NOTE   Craig Lindsey, Craig Lindsey                       MRN:          [id number]  DATE:01/24/2007                            DOB:          Jul 26, 1960    Craig Lindsey returns today for follow-up.  He is a very pleasant obese  middle-aged male with a history of atrial fibrillation and ventricular  fibrillation and morbid obesity who is status post ICD insertion.  The  patient returns today for follow-up.  He notes that he was recently  diagnosed with colon cancer and is scheduled to see Dr. Ezzard Standing for  colectomy tomorrow.  He denies chest pain.  He denies shortness of  breath.  He has had no palpitations.  He also was in the process of  being worked up for possible bariatric surgery secondary to his very  morbid obesity.   MEDICATIONS TODAY:  1. Warfarin as directed.  2. Potassium.  3. Aldactone 50 mg twice daily.  4. Metoprolol ER 58 mg t.i.d.  5. Diltiazem 240 mg daily.  6. Furosemide 40 daily..  7. Metformin 500 twice daily.   EXAM:  He is a pleasant obese middle-aged man in no distress.  Blood pressure is 145/91.  The pulse was 90 and regular.  Respirations  were 18.  The weight was 404 pounds  NECK:  Revealed no jugular distention, though was difficult to assess  with his obesity.  LUNGS:  Clear bilaterally to auscultation.  No wheezes, rales or rhonchi  are present.  CARDIOVASCULAR:  Regular rate and rhythm.  Normal S1-S2.  ABDOMEN:  Obese, nontender, nondistended.  EXTREMITIES:  No edema.   Interrogation of his defibrillator demonstrates a Medtronic Maximo with  P&R waves of 1 and 11, respectively.  The impedance 392 in the Atrium,  576 in the ventricle, the threshold voltage 0.8 and the AA volt of 0.2  in the V.  Battery voltage was 3 volts.  There are no intercurrent ICD  therapies.   IMPRESSION:  1. Status post ventricular fibrillation arrest.  2. Atrial fibrillation.  3.  History of Coumadin therapy.  4. Amiodarone previously discontinued.  5. Recent diagnosis of colon cancer.   DISCUSSION:  Craig Lindsey is stable today.  We spent a fair amount of time  talking about the issues of anticoagulation as well as amiodarone  therapy.  He may ultimately require additional  amiodarone to maintain him in sinus rhythm but for now because he has  only had 2 episodes  since stopping his amiodarone, we will not do this at the present time.  I will plan to see the patient back in the office in several months.     Doylene Canning. Ladona Ridgel, MD  Electronically Signed    GWT/MedQ  DD: 01/24/2007  DT: 01/24/2007  Job #: [id number]

## 2010-05-31 NOTE — Consult Note (Signed)
NAMESTUART, MIRABILE              ACCOUNT NO.:  [id number]   MEDICAL RECORD NO.:  [id number]          PATIENT TYPE:  INP   LOCATION:  5153                         FACILITY:  [facility]   PHYSICIAN:  Leighton Roach. Truett Perna, M.D. DATE OF BIRTH:  September 22, 1960   DATE OF CONSULTATION:  02/19/2007  DATE OF DISCHARGE:                                 CONSULTATION   REASON FOR CONSULTATION:  Colon cancer.   HISTORY OF PRESENT ILLNESS:  Mr. Manrique is a pleasant 50 year old man  asked to see for evaluation of sigmoid colon cancer.  In review, the  patient was to undergo a bariatric surgery early January, when blood in  the stools were noticed.  He was referred to Dr. Stan Head, for a  colonoscopy, performed on January 21, 2007.  This revealed an 8 cm  circumferential tumor, starting at 30 cm from the anal verge.  Biopsies  were positive for adenocarcinoma.  He underwent a sigmoid colectomy on  February 13, 2007, with pathology positive for colonic adenocarcinoma,  well differentiated, 6.5 cm, extending through the muscularis propria  into the subserosa.  Surgical margins were all negative for carcinoma.  Of 12 lymph nodes, one lymph node was positive.  He is T3, N1, MX (case  number [id number], Dr. Colonel Bald).  Of note, radiological studies included a CT  of the abdomen and pelvis with contrast on January 22, 2007, remarkable  for a 7.9 mm indeterminate hyperattenuating lesion, arising from the  anterior segment of the right lobe of the liver, just below the liver  capsule.  There is a single lymph node anterior to the left common iliac  artery, below the aortic bifurcation, measuring 10.5 mm.  No other  findings were noticed.  Chest x-ray shows no active disease.  Based on  all these findings, we were asked to see the patient with  recommendations regarding his care.   PAST MEDICAL HISTORY:  1. History of pneumonia in 2004, complicated by MRSA infection,      followed by secondary ventricular fibrillation  arrest, requiring      AICD.  2. Ejection fraction 60-65%.  3. History of paroxysmal atrial fibrillation, on Coumadin.  4. Morbid obesity.  5. Sleep apnea, on CPAP.  6. Diabetes mellitus type 2.   PAST SURGICAL HISTORY:  1. Status post laparoscopic sigmoid colectomy, redo sigmoidoscopy and      repair of umbilical hernia on February 11, 2007.  2. Status post cholecystectomy.   ALLERGIES:  NO KNOWN DRUG ALLERGIES.   MEDICATIONS:  1. Tiazac 240 mg daily.  2. Heparin per pharmacy.  3. NovoLog as directed.  4. Toprol 150 mg daily.  5. Morphine sulfate p.r.n.  6. Chloraseptic p.r.n.  7. Norco q.6h. p.r.n.  8. Zofran q.6h. p.r.n.  9. Phenergan q.6h. p.r.n.   REVIEW OF SYSTEMS:  Remarkable for blood in the stools only noticed  around early January 2009, and mild dyspnea on exertion.  Otherwise, all  15-point review of systems is essentially negative.   FAMILY HISTORY:  Negative for colon cancer, noncontributory.  All his  family members are alive and well.  SOCIAL HISTORY:  The patient is married, he lives in [place], [place].  He is a Occupational hygienist.  No tobacco or alcohol history.   PHYSICAL EXAMINATION:  VITAL SIGNS:  Blood pressure 152/101, pulse 92,  respirations 20, temperature 97.8, pulse oximeter 96% on room air.  GENERAL APPEARANCE:  This is a morbidly obese 50 year old white male in  no acute distress.  Alert and oriented x3.  HEENT:  Normocephalic and atraumatic.  PERRLA.  Oral mucosa without  lesions or thrush.  NECK:  Supple.  No cervical or supraclavicular masses.  LUNGS:  Essentially clear to auscultation.  No axillary masses.  CARDIOVASCULAR:  Irregularly irregular rate and rhythm without murmurs,  rubs, or gallops.  ABDOMEN:  Soft, tender at the incision area, this contains staples and  some erythema around the incision.  Bowel sounds x4, no apparent  hepatosplenomegaly.  The patient is morbidly obese, therefore the  abdominal exam is limited due to  the body habitus.  EXTREMITIES:  No clubbing, cyanosis, or edema.  No inguinal masses.  SKIN:  Without significant bruising other than at the incisional area,  no petechial rash.  MUSCULOSKELETAL:  Without spinal tenderness.  NEUROLOGIC:  Nonfocal.   LABORATORY DATA:  Hemoglobin 13.6, hematocrit 41, white count 13,  platelets 199, MCV 88.5.  Sodium 138, potassium 4.2, BUN 13, creatinine  1.01, glucose 158, total bilirubin 0.9, alkaline phosphatase 74, AST 31,  ALT 52, total protein 7.2, albumin 3.8, calcium 8.1.  CEA 1.4 as of  February 14, 2007.   IMPRESSION:  Dr. Truett Perna has seen and evaluated the patient and  reviewed the chart.  1. Sigmoid colon cancer, T3, N1, stage III, status post sigmoid      colectomy.  2. Soft tissue metastasis noted on final pathology, of unknown      significance.  Dr. Truett Perna is to discuss this finding with the      pathologist.  3. Indeterminate 7.9 mm liver lesion and a 10.5 mm left iliac node on      preoperative CT.  4. History of ventricular fibrillation, status post automatic      implanted cardioverter defibrillator placement.  5. Atrial fibrillation, on Coumadin.  6. Diabetes mellitus.  7. Sleep apnea.   Dr. Truett Perna reviewed the pathology report and adjuvant treatment  options with Mr. Morina and his wife.  The survival benefit associated  with adjuvant 5-FU based therapy was discussed.  The preoperative CT  will be reviewed and will be discussed with Dr. Ezzard Standing and with the  pathologist.  We will likely recommend adjuvant Xeloda or FOLFOX.   RECOMMENDATIONS:  1. Outpatient follow-up at the cancer center.  2. Adjuvant chemotherapy to begin in four weeks postoperatively.  3. Dr. Truett Perna will add his case to the February 22, 2007, GI Cancer      Conference.   Thank you very much for allowing [place] the opportunity to participate in  the care of Mr. Claudette Laws.      Marlowe Kays, P.A.      Leighton Roach. Truett Perna, M.D.  Electronically  Signed    SW/MEDQ  D:  02/19/2007  T:  02/19/2007  Job:  [id number]   cc:   Doylene Canning. Ladona Ridgel, MD  Iva Boop, MD,FACG  Karie Schwalbe, MD

## 2010-05-31 NOTE — Op Note (Signed)
NAMELUIZ, TRUMPOWER              ACCOUNT NO.:  [id number]   MEDICAL RECORD NO.:  [id number]          PATIENT TYPE:  AMB   LOCATION:  DAY                          FACILITY:  [facility]   PHYSICIAN:  Sandria Bales. Ezzard Standing, M.D.  DATE OF BIRTH:  Nov 01, 1960   DATE OF PROCEDURE:  03/21/2007  DATE OF DISCHARGE:                               OPERATIVE REPORT   PREOPERATIVE DIAGNOSIS:  Sigmoid colon carcinoma, anticipating  chemotherapy.   POSTOPERATIVE DIAGNOSIS:  Sigmoid colon carcinoma, anticipating  chemotherapy.   PROCEDURE:  1. Right subclavian Bard Power Port placement.   SURGEON:  Sandria Bales. Ezzard Standing, M.D.   FIRST ASSISTANT:  None.   ANESTHESIA:  General endotracheal.   ANESTHESIA:  10 mL of local anesthetic at the end of the procedure.   HISTORY:  Mr. Seda is a 50 year old white male who is a patient of Dr.  Tillman Abide whom I did a sigmoid colon resection for a T3N1 carcinoma  of the sigmoid colon on February 13, 2007.  He is seen Dr. Jillyn Hidden B.  Sherrill from an oncology standpoint, and is considering chemotherapy  and has the need of IV access for chemotherapy.   The patient has a left subclavian AICD device which is cared for by Dr.  Lewayne Bunting.  A long discussion was carried out with the patient about  the options for IV access for chemotherapy. He has poor peripheral  veins.  Since the chemotherapy will last approximately 6 months, a PICC  line did not seem practical in that he would have to be changed several  times over the treatment course, and had a risk of local infection, and  we thought where there is some risk of infection  Therefore it is  thought that the safest method of venous access would be Port-A-Cath  placement.   My plan was to place this in his right subclavian and right internal  jugular vein.  I discussed with him the risk of the procedure; to  include, infection, bleeding, blood clots around the catheter, failure  of the catheter.   OPERATIVE NOTE:   The patient placed in the supine position with both of  his arms tucked.  He was given 1 g of Ancef at the initiation of the  procedure.  His upper chest was prepped with Betadine solution and  sterilely draped.   I accessed his right subclavian vein on the first stick with a 18 guage  needle, and threaded a guidewire down towards his right ventricle. I  checked the position with fluoroscopy.  I then sewed in the reservoir  for the [employer] in the right upper chest.  I passed the tubing  for the reservoir to the right subclavian stick site, and introduced the  tubing into the right subclavian vein using the 8 [place] introducer.  I  tried to position the Power Port catheter at thejunction of the right  superior vena cava and right atrial outlet.   The patient tolerated this part of the procedure well.  This was checked  under fluoroscopy.  I then attached the tubing to the  reservoir.  I  flushed the whole unit first with dilute heparin, and then flushed it  with 5 5cc of concentrated heparin of 100 units/mL.  I sewed the  reservoir in with 3-0 Vicryl sutures.  I checked the position of the  whole catheter using floroscopy to make sure there was no kink.  I then  closed the subcutaneous tissue with 3-0 Vicryl suture, and the skin with  a running 5-0 Vicryl suture.   I painted the wound with Tincture of Benzoin and Steri-Striped them.  Sponge and needle count were correct at the end of the case.  The  patient tolerated the procedure well.  Chest x-ray is pending at the  time of this dictation.  He will be discharged home, today, and will  start chemotherapy when planned by Dr. Jillyn Hidden B. Sherrill.      Sandria Bales. Ezzard Standing, M.D.  Electronically Signed     DHN/MEDQ  D:  03/21/2007  T:  03/21/2007  Job:  [id number]   cc:   Karie Schwalbe, MD  [address] [place], [place] [address]   Leighton Roach. Truett Perna, M.D.  Fax: [phone]   Doylene Canning. Ladona Ridgel, MD  [address]  [address]  [place]  [place] [address]   Iva Boop, MD,FACG  [facility]  [address] [place], [place] [address]

## 2010-05-31 NOTE — Assessment & Plan Note (Signed)
[facility]                         ELECTROPHYSIOLOGY OFFICE NOTE   JEARL, SOTO                       MRN:          [id number]  DATE:10/15/2006                            DOB:          02-11-1960    Mr. Iqbal returns today for follow-up.  He is a very pleasant, morbidly  obese man who has a remote history of recurrent VF arrest, who is status  post ICD insertion, history of atrial fibrillation occurring while he  was critically ill and a history of diabetes and morbid obesity.  The  patient returns today in  Follow-up.  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  He does note that  he has been unable to lose weight despite multiple attempts and in fact  has gained some weight in the last year, now weighing in at 409, up from  402 pounds.  Previously was down as low as 350 pounds.  The patient is  undergoing consideration for bariatric surgery with Dr. Ezzard Standing at  [facility].  Otherwise he had no specific complaints  today.  He has experienced no intercurrent ICD therapies.   MEDICATIONS:  Include potassium, Coumadin as directed, Toprol 50 mg 3  tablets daily, furosemide 40 daily, diltiazem 240 daily, and amiodarone  200 mg daily. He is also on aldactone 50 b.i.d. and Metformin.   INTERROGATION OF HIS DEFIBRILLATOR:  Demonstrates a Medtronic Maximow  with P waves of 1 and R waves of 13, the impedance 432 and the atrium  720 in the right ventricle, the threshold was volt of 0.8 at the atrium,  a volt of 0.1 in the right ventricle.  Battery voltage was 3.03 volts.  Underlying rhythm was sinus rhythm versus sinus tachycardia.  He has had  no episode of sustained VT, he had one nonsustained episode noted.  He  also had one mode switch for 12 seconds.   IMPRESSION:  1. Status post VF arrest.  2. Atrial fibrillation.  3. Morbid obesity.  4. Diabetes.  5.  Hypertension.   DISCUSSION:  Overall Mr. Valeriano is stable except that he continues to  gain weight.  I support his decision to consider bariatric surgery.  Overall his risk for cardiovascular complications from this procedure  would be low though not zero.  I think there is no other additional  testing that we would do at this point that might lower his risk.  I  will plan to see the patient back in the office in 1 year, sooner for  additional problems or ICD shocks.    Doylene Canning. Ladona Ridgel, MD  Electronically Signed   GWT/MedQ  DD: 10/16/2006  DT: 10/16/2006  Job #: [id number]   cc:   Sandria Bales. Ezzard Standing, M.D.

## 2010-05-31 NOTE — Assessment & Plan Note (Signed)
[facility]                         ELECTROPHYSIOLOGY OFFICE NOTE   LONZY, MATO                       MRN:          [id number]  DATE:03/07/2007                            DOB:          03-03-1960    HISTORY:  Mr. Topper returns today for followup.  He is very pleasant  obese middle-aged male with a history of atrial fibrillation and is  status post a VF arrest, status post ICD insertion.  He has colon cancer  which was recently resected with 1 of 12 lymph nodes positive.  He is  scheduled to be seen for consideration for chemotherapy under the  direction of Dr. Mancel Bale in the next day or 2.  He returns today  for followup.  He has had no palpitations.  Denies atrial fibrillation.  He is healing up nicely from his abdominal surgery, although he does  have part of his incision healing by secondary intention.  The patient  has no chest pain.  He denies worsening dyspnea.   PHYSICAL EXAMINATION:  GENERAL:  He is a pleasant obese middle-aged man  in no distress.  VITAL SIGNS:  Blood pressure was 132/70, pulse was 100 and regular,  respirations were 18, weight was 380 pounds.  NECK:  Revealed no jugular venous distention.  LUNGS:  Clear bilaterally to auscultation.  No wheezes, rales or rhonchi  are present.  CARDIOVASCULAR:  Regular rate and rhythm.  Normal S1-S2.  HEART:  Sounds were distant.  ABDOMINAL:  Soft, nontender and nondistended.  His vertical abdominal  incision was healing by secondary intention.  EXTREMITIES:  Demonstrated no edema.   MEDICATIONS:  1. Warfarin 4 mg alternating with 6 mg potassium 20 mEq a day.  2. Aldactone 50 twice daily.  3. Metoprolol ER 50 3 times a day.  4. Diltiazem 240 a day.  5. Furosemide 40 a day.  6. Metformin 500 b.i.d.   Interrogation of his defibrillator demonstrates a Medtronic Maximo with  P and R waves of 1.5 and 9.9, impedance 416 in the atrium, 600 in the  ventricle.  He was 97.7%  a-sensed and v-sensed.  His AFib burden was  about 50% of the time in the last few weeks.  His pacing activity was  decreased as a consequence of his surgery.  Typically, he has gone out  of rhythm 1-3 hours a day for the last week or so on several days.   IMPRESSION:  1. Paroxysmal atrial fibrillation.  2. Morbid obesity.  3. Colon cancer status post resection.  4. History of ventricular fibrillation arrest status post implantable      cardioverter-defibrillator insertion.   DISCUSSION:  The main issue I am concerned about today with regard to  Mr. Straka's care is with his scheduled possible chemotherapy.  Port-A-  Cath insertion would increase his risk for infection of his ICD leads.  I will discuss this with Dr. Mancel Bale who will be his primary  oncologist.  With regard to the patient's AFib, I have started him back  on amiodarone 200 mg daily.  We will see him back in several months.  Doylene Canning. Ladona Ridgel, MD  Electronically Signed    GWT/MedQ  DD: 03/07/2007  DT: 03/08/2007  Job #: [id number]   cc:   Leighton Roach. Truett Perna, M.D.

## 2010-06-03 NOTE — Op Note (Signed)
Craig Lindsey, Craig Lindsey              ACCOUNT NO.:  [id number]   MEDICAL RECORD NO.:  [id number]          PATIENT TYPE:  OIB   LOCATION:  5732                         FACILITY:  [facility]   PHYSICIAN:  Leonie Man, M.D.   DATE OF BIRTH:  Aug 01, 1960   DATE OF PROCEDURE:  11/24/2005  DATE OF DISCHARGE:  11/25/2005                               OPERATIVE REPORT   PREOPERATIVE DIAGNOSIS:  Chronic calculous cholecystitis.   POSTOPERATIVE DIAGNOSIS:  Chronic calculous cholecystitis.   PROCEDURE:  Laparoscopic cholecystectomy with intraoperative  cholangiogram.   SURGEON:  Leonie Man, M.D.   ASSISTANT:  Gita Kudo, M.D.   ANESTHESIA:  General.   INDICATIONS:  The patient is a morbidly obese man presenting with upper  abdominal pain, nausea and vomiting and diagnostic testing showing  cholelithiasis.  Liver function studies were within normal limits.  The  patient comes to the operating room after risks and potential benefits  of surgery have been discussed.  All questions answered and consent  obtained.   PROCEDURE:  Following the induction of satisfactory general anesthesia  with the patient positioned supinely, the abdomen is routinely prepped  and draped to be included in a sterile operative field.  Open  laparoscopy is created just above the umbilicus with insertion of a  Hassan cannula and insufflation of the peritoneal cavity to 15 mmHg  pressure using carbon dioxide.  The camera is inserted and visual  exploration of the abdomen carried out.  Liver edges sharp.  Liver  surfaces smooth.  Chronically scarred gallbladder with multiple  adhesions to the gallbladder wall.  The pelvis showed no additional  adhesions.  None of the small or large intestine appeared to be  abnormal.   Under direct vision, epigastric and lateral ports placed.  Because of  the patient obesity, a 30 degree scope had to be placed, an additional  port placed in the midline so as to retract the  transverse colon in the  inferior position.  Gallbladder was grasped and retracted cephalad and  dissection carried down in the region of the ampulla and hepatoduodenal  ligament with isolation of the cystic artery and cystic duct.  The  cystic duct was clipped proximally and opened and cannulated with a Cook  catheter.  Under fluoroscopic guidance a cholangiogram was carried out  using one and a half strength Hypaque with the results showing a normal  caliber extrahepatic ductal system, no filling defects and prompt flow  of contrast into the duodenum.  The cystic duct catheter was removed and  the cystic duct was triply clipped and transected.  Dissection in the  remainder of the triangle showed the cystic artery which was isolated,  triply clipped and transected.  Gallbladder was then dissected free from  the liver bed using electrocautery and maintaining hemostasis along the  course of dissection with electrocautery.  At the end of dissection, the  gallbladder removed and placed in an Endopouch.  Additional bleeding  points within the liver bed treated with electrocautery.  Gallbladder  was then retrieved through the umbilical port with the camera placed in  the  epigastric port.  All areas of dissection again checked for  hemostasis and the right upper quadrant was then thoroughly irrigated  with normal saline.  The trocars were then removed under direct vision.  Sponge, instrument and sharp counts were verified.  The pneumoperitoneum  was allowed to deflate and the abdominal wounds closed in layers as  follows, the umbilical wound in two layers with 0 Vicryl and 4-0  Monocryl, epigastric and flank wounds  closed with 4-0 Monocryl sutures.  All wounds reinforced with Steri-  Strips.  Sterile dressings applied.  Anesthetic reversed.  The patient  removed from the operating room to the recovery room in stable  condition.  He tolerated the procedure well.      Leonie Man, M.D.   Electronically Signed     PB/MEDQ  D:  11/27/2005  T:  11/27/2005  Job:  [id number]

## 2010-06-03 NOTE — Assessment & Plan Note (Signed)
[facility]                           ELECTROPHYSIOLOGY OFFICE NOTE   DATRON, BRAKEBILL                       MRN:          [id number]  DATE:10/12/2005                            DOB:          1960-01-31    REASON FOR VISIT:  Mr. Kerby is seen in Device Clinic today, October 12, 2005, for routine follow up of his Medtronic [place], model 7278, dual  chamber defibrillator implanted in January 2005 for a ventricular  fibrillation arrest.   Upon interrogation battery voltage is 3.07 volts with the last recorded  charge time of 8.15 seconds.  There were no episodes stored in the device.  In the atrium intrinsic amplitude is 2.8 mV with a impedance of 448 ohms and  a threshold of 1 volt at 0.5 msec.  In the ventricle intrinsic amplitude is  12.6 mV with an impedance of 720 ohms and a threshold of 1 volt at 0.1 msec.  High voltage lead impedance is 61 ohms.  No programming changes were made at  this time.   Mr. Amend will continue with [facility] at three, six and nine  months; and, see Dr. Ladona Ridgel for follow up in one year's time.      ______________________________  Cleatrice Burke, RN    ______________________________  Doylene Canning. Ladona Ridgel, MD    CF/MedQ  DD:  10/12/2005  DT:  10/13/2005  Job #:  [id number]

## 2010-06-03 NOTE — Op Note (Signed)
NAME:  Craig Lindsey, Craig Lindsey                        ACCOUNT NO.:  [id number]   MEDICAL RECORD NO.:  [id number]                   PATIENT TYPE:  INP   LOCATION:  3309                                 FACILITY:  [facility]   PHYSICIAN:  Doylene Canning. Ladona Ridgel, M.D.               DATE OF BIRTH:  1960/08/23   DATE OF PROCEDURE:  01/29/2003  DATE OF DISCHARGE:                                 OPERATIVE REPORT   PROCEDURE:  Insertion of a dual chamber ICD.   INDICATIONS FOR PROCEDURE:  Spontaneous and unexplained VF arrest.   HISTORY:  The patient is a 50 year old man with a history of morbid obesity  who initially was admitted to the hospital with atrial fibrillation and had  an initial VF arrest in conjunction with respiratory arrest. She was  intubated and treated aggressively.  Two weeks into his hospitalization he  again had a spontaneous VF arrest which he underwent successful  resuscitation.  He is now referred for ICD insertion.  Of note, the patient  has no coronary disease.   DESCRIPTION OF PROCEDURE:  After informed consent was obtained, the patient  was taken to the diagnostic EP lab in the fasted state.  After the usual  preparation and draping, intravenous fentanyl and midazolam was given for  sedation. A total of 30 mL of lidocaine was infiltrated into the left  infraclavicular region.  A 10 cm incision was carried out over this region  and electrocautery utilized to dissect down to the fascial plane.  The left  subclavian vein was punctured x2 and the Medtronic model 6949, 65 cm active  fixation defibrillation lead serial number [id number] was advanced into the  right ventricle.  The Medtronic model 5076, 52 cm active fixation pacing  lead serial number [id number] was advanced into the right atrium.  Mapping  was carried out in the right ventricle and at the final site near the RV  apex, the R waves measured 15 millivolts and the pacing threshold 0.8 volts  at 0.5 milliseconds with a pacing  impendence of 1190 ohms.  10 volt pacing  did not stimulation the diaphragm once the lead was actively fixed.  Attention was then turned to the atrial lead where fibrillation waves  measured 6 millivolts and atrial pacing once the lead was actually fixed  demonstrated a 500 ohm impedence.  Temporal pacing did not stimulate the  diaphragm with atrial pacing at 10 volts.  With both atrial and ventricular  leads in satisfactory position, it was secured to the subpectoralis fascia  with a figure-of-eight silk suture. Electrocautery was then utilized to make  a subcutaneous pocket.  Kanamycin irrigation was utilized to irrigate the  pocket.  Electrocautery was utilized to ensure hemostasis.  The Medtronic  Maximo DR model 7278 dual chamber ICD was then connected to the  defibrillation and atrial lead and placed in the subcutaneous pocket.  It  was secured with a  silk suture. Additional kanamycin was utilized to  irrigated the incision and the patient was deeply sedated and defibrillation  threshold testing carried out.   After more deep sedation was accomplished with fentanyl and Versed, VF was  induced with a T wave shock.  A 15 joule shock terminated ventricular  fibrillation restoring atrial fibrillation.  Five minutes was allowed to  elapse and a second VF test carried out.  Again VF was induced with a T wave  shock and this time 15 joules terminated both ventricular fibrillation and  atrial fibrillation.  The patient will be attempted to return back to atrial  fibrillation to prevent thromboembolic complications following the  procedure.   COMPLICATIONS:  There were no immediate procedure complications.   RESULTS:  This demonstrates successful implantation of a Medtronic dual  chamber ICD in a patient with VF arrest.                                               Doylene Canning. Ladona Ridgel, M.D.    GWT/MEDQ  D:  01/29/2003  T:  01/30/2003  Job:  [id number]   cc:   Kathrine Cords, R.N. [facility]

## 2010-06-03 NOTE — Op Note (Signed)
NAME:  Craig Lindsey, Craig Lindsey                        ACCOUNT NO.:  [id number]   MEDICAL RECORD NO.:  [id number]                   PATIENT TYPE:  INP   LOCATION:  2103                                 FACILITY:  [facility]   PHYSICIAN:  Hermelinda Medicus, M.D.                DATE OF BIRTH:  April 01, 1960   DATE OF PROCEDURE:  01/14/2003  DATE OF DISCHARGE:                                 OPERATIVE REPORT   PREOPERATIVE DIAGNOSIS:  Cardiopulmonary arrest with atrial fibrillation.   POSTOPERATIVE DIAGNOSIS:  Cardiopulmonary arrest with atrial fibrillation.   OPERATION PERFORMED:  Tracheostomy using a #8 Bivona single cuff trach tube,  130 mm in length.   SURGEON:  Hermelinda Medicus, M.D.   ASSISTANT:  Ines Bloomer, M.D.   ANESTHESIA:  General.   ANESTHESIOLOGIST:  Zenon Mayo, MD   INDICATIONS FOR PROCEDURE:  The patient is a 50 year old male who had  respiratory distress and a cardiopulmonary arrest, who has been intubated  and the cardiologist and pulmonologist have been trying to bring him through  recovery.  He, however, has had an intubation tube in place for 18 days and  our plan is for a tracheostomy.  His weight is 431 pounds which makes it a  little more difficult.   DESCRIPTION OF PROCEDURE:  The patient was placed in supine position and  under general endotracheal anesthesia, the patient was positioned and his  neck was extended as much as possible to distract the very large supple neck  that he has.  We then prepped and draped appropriately using Betadine and  the usual neck drape and a horizontal incision was made in the inferior  aspect of his anterior neck over the isthmus of the thyroid just below the  cricoid.  This was carried down through the skin and then through a  considerable amount of adipose tissue down to the strap muscles.  The strap  muscles were identified.  All hemostasis was established with Bovie  electrocoagulation and 3-0 silk ties.  Once the strap  muscles were  separated, we then used cerebellar retractors to establish retraction for  good visualization and worked out way down through the isthmus of the  thyroid, separated that.  All hemostasis being established and then the  trachea was established in good position.  The cricoid was palpated.  The #2  anterior ring incision was made and a 1 cm section of the anterior tracheal  ring was removed in an effort to gain an excellent access.  We then  retracted the intubation tube back  where a previously tested and presented  #8 Bivona tracheostomy tube was then placed without difficulty and with  minimal trauma.  Once this was completed, we then sutured the neck incision  using 2-0 silk.  We sutured the Bivona tube also in place using 2-0 silk and  then we set the Bivona at 120 mm. As it fit in  extremely well, we could  advance it a little bit more if we wanted to, but the distance from skin to  trachea was estimated about 4 cm so this was felt to be appropriate.  At  this point we checked again for any  bleeding and then brought the skin in approximation, used two straps, one  just the straight tape, the other adjustable tape for security considering  the amount of pressure that will be needed to inflate his lungs.  The  patient tolerated the procedure very well and is doing well postoperatively  and is back in his intensive care unit.                                               Hermelinda Medicus, M.D.    JC/MEDQ  D:  01/14/2003  T:  01/14/2003  Job:  [id number]   cc:   Ines Bloomer, M.D.  [address]  [place]  [place] [address]   Oley Balm. Sung Amabile, M.D. [facility]

## 2010-06-03 NOTE — Discharge Summary (Signed)
NAME:  Craig Lindsey, Craig Lindsey                        ACCOUNT NO.:  [id number]   MEDICAL RECORD NO.:  [id number]                   PATIENT TYPE:  INP   LOCATION:  3740                                 FACILITY:  [facility]   PHYSICIAN:  Ellwood Dense, M.D.                DATE OF BIRTH:  1960-08-12   DATE OF ADMISSION:  12/27/2002  DATE OF DISCHARGE:  02/18/2003                                 DISCHARGE SUMMARY   DISCHARGE DIAGNOSES:  1. Deconditioning with multi-medical.  2. Cardiac respiratory arrest.  3. Tracheostomy since extubated.  4. Sleep apnea.  5. Ileus, resolved.  6. Positive Methicillin-resistant Staphylococcus aureus in the sputum with     contact precautions.  7. Positive blood cultures of gram negative rods.  8. Acute renal failure, resolved.  9. Ventricular tachycardia with dual chamber implantable cardioverter-     defibrillator 44010.  10.      Obesity.  11.      Decreased nutritional storage.   HISTORY OF PRESENT ILLNESS:  This 50 year old white male with history of  transient atrial fibrillation, obesity, questionable sleep apnea who was  admitted December 27, 2002, with shortness of breath, became unresponsive en  route to the emergency room, developed ventricular tachycardia and CPR  initiated.  Upon evaluation, EKG showed sinus tachycardia a 108  beats/minute.  No acute ST or T-wave changes.  Chest x-ray with poor  aeration. He remained intubated. Follow up per cardiology services. Placed  on Amiodarone.  Echocardiogram was a poor study secondary to difficulty to  visualize.  Thus, a TEE was completed showing normal LV size, ejection  fraction 55 to 65% without thrombus.  Long hospital course complicated by  MRSA of the sputum. Placed on intravenous Vancomycin and Zosyn with follow  up per infectious disease Dr. Orvan Falconer.  Received TNA for a short time.  Acute renal failure with creatinine increased from 1.6 to 4.1. Renal service  consult Dr. Arlean Hopping, felt to be  secondary to sepsis.  Received hemodialysis  for a short time with steady improvement with last creatinine 1.2 on  February 18, 2003.  He developed an ileus with followup gastroenterology  services of [facility]. Clostridium difficile negative.  A  nasogastric tube was then placed for a short time with diet advanced. Again,  with spontaneous VF arrest during hospital stay. Underwent dual chamber ICD  per Dr. Ladona Ridgel January 29, 2003.  Lovenox and Coumadin were initiated at  that time.  Due to prolonged ventilator support, a tracheostomy was  performed December 28, 2002, and later extubated February 16, 2003.  Remained  on a Kin-Air bed to maintain skin.  Intravenous Vancomycin completed February 16, 2003, with contact precautions.  Limited endurance.  He was now up with  a standard walker 150 feet. Latest chemistries unremarkable. He was admitted  for comprehensive rehab program.   PAST MEDICAL HISTORY:  See discharge diagnoses.   ALLERGIES:  None.   Denies alcohol and tobacco.   MEDICATIONS PRIOR TO ADMISSION:  Questionable Atenolol and Prinivil, doses  were not available.   SOCIAL HISTORY:  Lives with his wife of less than one year. Independent  prior to admission.  He is a Medical illustrator.  30 year old daughter of the home.  Two-level home, bedroom upstairs. Wife works day shift. Good support of  local family.   HOSPITAL COURSE:  The patient with progressive gains while on rehab services  with therapies initiated daily.  The following issues are followed during  the patient's rehab course.  Pertaining to Mr. Wank's overall hospital  course and deconditioning with multiple medical issues, his functional  status continued to improve greatly as patient and family were encouraged  with progress. He was now supervision for ambulation with endurance  improved.  He was now 400 feet with a standard walker, independent bed  mobility and activities of daily living.  Home Health therapies  would be on  going to help promote his overall well being.  His tracheostomy site was  healing nicely. He had been extubated on February 16, 2003.  He received  routine follow up per critical care medicine as well as cardiology services  for cardiac respiratory arrest.  A CPAP had now been arranged for sleep  apnea which was in question even before his hospital admission.  All  arrangements had been made for home CPAP machine. He would remain on  Coumadin therapy.  Contacts were made to Shelby Dubin of the [facility] to continue to follow as well as cardiology services.  At this point, he remained on his amiodarone and this would be adjusted as  it was advised that he remain on 400 mg for now.  He would also receive  followup TSH levels as well as LFTs in one month.  He remained on contact  precautions due to positive sputum cultures of MRSA.  He did spike a fever.  Workup of chest x-ray and urine study was negative, felt to be related to  PICC line.  Blood cultures from PICC line did show gram negative rods. He  had initially been placed on Avelox at the time of the spiked fever.  This  was changed to Ceftriaxone and would be on going for seven days after  discharge.  Home Health nurse had been arranged for followup care.  Throughout his course, his blood pressures remained controlled.  He had no  bowel or bladder disturbances.  The patient encouraged with progress. He  will be discharged to home.  Latest lab showed an INR of 2.4, hemoglobin  11.3. Hematocrit 33.8.  Chemistry with BUN of 6, creatinine 1.1, sodium 140,  potassium 3.5.  At the time of dictation, medications included Coumadin,  latest dose 8 mg  adjusted at time of discharge, multivitamin daily,  potassium chloride 20 mEq daily, Lanoxin 0.125 mg daily, Cardizem CD 240 mg  daily, Toprol XL 150 mg daily, amiodarone 400 mg p.o. daily, Lasix 40 mg daily, Aldactone 50 mg twice daily, Protonix 40 mg daily, Questran  4 grams  twice daily with water, IV Rocephin 2 grams every 24 hours x7 days.   ACTIVITY:  As tolerated.   DIET:  Regular.   SPECIAL INSTRUCTIONS:  1. Home Health nurse to draw prothrombin time on Monday, March 01, 2002     with results to Shelby Dubin of [facility].  2. The patient will follow up with Dr. Graciela Husbands, Cardiology services, as  advised to call for appointment.  PICC line remained in place until IV     antibiotics complete and then to be removed at that time.      Mariam Dollar, P.A.                     Ellwood Dense, M.D.    DA/MEDQ  D:  02/26/2003  T:  02/26/2003  Job:  [id number]   cc:   Cliffton Asters, M.D.  [address] [place]  [place] [address]  Fax: [phone]   Duke Salvia, M.D.   Wilhemina Bonito. Marina Goodell, M.D. [facility]   Maree Krabbe, M.D.  Fax: [phone]   Oley Balm. Sung Amabile, M.D. [facility]

## 2010-06-03 NOTE — Consult Note (Signed)
NAME:  Craig Lindsey, Craig Lindsey                        ACCOUNT NO.:  [id number]   MEDICAL RECORD NO.:  [id number]                   PATIENT TYPE:  INP   LOCATION:  2103                                 FACILITY:  [facility]   PHYSICIAN:  Pricilla Riffle, M.D.                 DATE OF BIRTH:  [birth year]   DATE OF CONSULTATION:  12/29/2002  DATE OF DISCHARGE:                                   CONSULTATION   IDENTIFICATION:  The patient is a 50 year old who we are asked to see  regarding atrial fibrillation.   HISTORY OF PRESENT ILLNESS:  The patient has no known history of atrial  fibrillation in the past.  He presented two days ago.  He was shopping,  became acutely short of breath in the parking lot.  When EMS arrived he was  found to be in atrial fibrillation with a rapid ventricular response.  Required intubation for heart failure.  On arrival to the emergency room  there was concern for ventricular tachycardia and he underwent  defibrillation x4, NPF, CPR, lidocaine, procainamide given with reversion to  sinus rhythm.  The patient remained in sinus rhythm yesterday.  Early this  morning reverted to atrial fibrillation with a narrow complex (approximately  30) with rapid ventricular response.   On reviewing the history, patient has no known coronary artery disease.   ALLERGIES:  NO KNOWN DRUG ALLERGIES   MEDICATIONS ON ADMISSION:  1. Atenolol.  2. Prinivil.   CURRENT MEDICATIONS:  1. Amiodarone 1 mg/minute.  2. Nitroglycerin 1 inch q.4h.  3. Reglan q.a.c./q.h.s.  4. Protonix 40 IV daily.  5. Heparin per protocol.   PAST MEDICAL HISTORY:  1. Hypertension.  2. Newly diagnosed diabetes.  3. Obesity.  4. Question sleep apnea.  The patient snores heavily.   SOCIAL HISTORY:  The patient is recently married.  [employer].  Lives  with his wife.  Does not smoke.  Does not drink.   FAMILY HISTORY:  Negative for CAD except in grandmother and MI at 50.   REVIEW OF SYSTEMS:  The patient  currently intubated, unable to obtain.   PHYSICAL EXAMINATION:  GENERAL:  The patient is a morbidly obese 50 year old  in moderate distress, intubated.  VITAL SIGNS:  Blood pressure 148/70, pulse 140s (irregular), temperature  100, O2 saturation 40% FiO2 at 98.  HEENT:  Normocephalic, atraumatic.  NECK:  Unable to assess JVP.  LUNGS:  Coarse breath sounds bilaterally.  CARDIAC:  Irregularly irregular.  S1, S2.  No definite S3.  ABDOMINAL:  Some decreased breath sounds, obese.  EXTREMITIES:  1+ pedal edema.  2+ distal pulses.  Feet warm.   LABORATORIES:  Hemoglobin 14.4, WBC 15.8.  BUN/creatinine 26/1.4, potassium  3.9.  Hemoglobin A1C 7.5.  CK-MB 79/1.9, #2 81/1.2, #3 132/1.6.  Troponin  0.02, 0.03, 0.02, 0.38.  BNP initially 151, now 134.   2-D echocardiogram:  Extremely poor even with Optison.  Rapid rate makes  evaluation of LV EF difficult.  Overall appears to be at least mildly  decreased.  No definite focal wall motion abnormalities.   IMPRESSION:  Craig Lindsey is a 50 year old gentleman who is now in a rapid  atrial fibrillation.  He is currently intubated.  Unclear exactly what  happened.  May have been that the atrial fibrillation sent him into a  respiratory distress.  It is not unreasonable to try to cardiovert him and  then see how he does clinically.  He is currently receiving amiodarone.  Would recommend another intravenous bolus.  Indeed, he may need additional  with another try of electrical cardioversion.  He has failed x2 now.  Once  he is back in sinus rhythm, will need to determine where to go with his  acute decompensation.                                               Pricilla Riffle, M.D.    PVR/MEDQ  D:  12/29/2002  T:  12/29/2002  Job:  [id number]

## 2010-06-03 NOTE — Consult Note (Signed)
NAME:  CARDEN, TEEL                        ACCOUNT NO.:  [id number]   MEDICAL RECORD NO.:  [id number]                   PATIENT TYPE:  INP   LOCATION:  2103                                 FACILITY:  [facility]   PHYSICIAN:  Maree Krabbe, M.D.             DATE OF BIRTH:  10/21/1960   DATE OF CONSULTATION:  01/03/2003  DATE OF DISCHARGE:                                   CONSULTATION   PRIMARY CARE PHYSICIAN:  Casimiro Needle B. Sherene Sires, M.D.   REASON FOR CONSULTATION:  Acute renal failure.   HISTORY:  The patient is a 50 year old white male with a history of obesity,  hypertension, atrial fibrillation, who, on December 11, became acutely short  of breath in a parking lot.  He was found to be in atrial fibrillation with  rapid ventricular response in the emergency room.  He had ventricular  tachycardia and underwent defibrillation with CPR and received  antiarrhythmics with conversion to sinus rhythm.  He was found to be in  pulmonary edema radiographically.  He was admitted to ICU.  Creatinine was  1.3 on admission.  He was felt to have respiratory arrest probably related  to acute pulmonary edema from diastolic dysfunction and hypertensive heart  disease.  Echocardiogram confirmed hypertensive ventricle with good LV  function.  He was treated with amiodarone and beta blocker and blood  pressure medications.  He developed fevers, and blood cultures were drawn on  December 14 which returned positive in blood for MRSA and methicillin-  sensitive Staphylococcus epidermidis.  Sputum cultures returned positive for  methicillin-sensitive Staphylococcus aureus.  The blood cultures had been  drawn from an old arterial line.  He was felt to have line-associated  Staphylococcal infection and bacteremia.   Creatinine was up to 1.6 yesterday and then jumped up to 4.1 today.  There  has been no intercurrent hypotensive episodes, IV contrast, or other  nephrotoxins.  He is on a Glucomander insulin drip  currently also.   PAST MEDICAL HISTORY:  As above.   ALLERGIES:  None.   MEDICATIONS ON ADMISSION:  Atenolol and Prinivil.   CURRENT MEDICATIONS:  IV Reglan, IV Lopressor, insulin drip, albuterol,  Zosyn, vancomycin, Xigris, TNA.   SOCIAL HISTORY:  The patient was recently married, works as a Sports coach, lives with his wife.  Does not smoke or drink.   FAMILY HISTORY:  Negative for renal disease.   REVIEW OF SYSTEMS:  Unable to obtain.   PHYSICAL EXAMINATION:  GENERAL:  The patient is a morbidly obese 50 year old  male, intubated and sedated.  VITAL SIGNS:  Blood pressure 130/70, heart rate 90, respirations 30, FIO2  50%, O2 saturation 95%.  EVP 15.  Urine output is 300 ml over the last 8  hours.  SKIN:  Without rash.  HEENT:  PERRLA.  EOMI.  CHEST: Clear throughout.  CARDIAC:  Regular rate and rhythm without murmur, rub, or gallop.  ABDOMEN:  Soft, obese, nontender.  EXTREMITIES:  2+ lower extremity edema.  NEUROLOGIC:  He does move his extremities to painful stimulation.  Does not  follow commands.   LABORATORY DATA:  Sodium 144, potassium 4.5, CO2 25, BUN 45, creatinine 4.1,  calcium 7.9.  INR 1.5.  Blood gas showed pH 7.21, PCO2 61, PO2 80.  Liver  enzymes  within normal limits.   IMPRESSION:  1. Acute renal failure related to sepsis and multiorgan dysfunction.  2. Staphylococcus aureus sepsis probably related to a line infection.  3. Acute pulmonary edema on admission felt to be due to hypertensive heart     disease and flash pulmonary edema which is mostly resolved with some     persistent basilar infiltrates.  4. Hypertension, chronic.  5. Ileus.  6. Respiratory acidosis.  7. Nutrition on total nutrition admixture (TNA).   PLAN:  Will place catheter for initiation of continuous venovenous  hemodialysis. Discussed with family at length.                                               Maree Krabbe, M.D.    RDS/MEDQ  D:  01/03/2003  T:  01/04/2003   Job:  [id number]   cc:   Charlaine Dalton. Sherene Sires, M.D. [facility]

## 2010-06-03 NOTE — Op Note (Signed)
Craig Lindsey, Craig Lindsey              ACCOUNT NO.:  [id number]   MEDICAL RECORD NO.:  [id number]          PATIENT TYPE:  OIB   LOCATION:  5732                         FACILITY:  [facility]   PHYSICIAN:  Leonie Man, M.D.   DATE OF BIRTH:  [birth year]   DATE OF PROCEDURE:  11/24/2005  DATE OF DISCHARGE:  11/25/2005                               OPERATIVE REPORT   PREOPERATIVE DIAGNOSIS:  Chronic calculous cholecystitis.   POSTOPERATIVE DIAGNOSIS:  Chronic calculus cholecystitis.   PROCEDURE:  Laparoscopic cholecystectomy with intraoperative  cholangiogram.   SURGEON:  Leonie Man, M.D.   ASSISTANT:  Gita Kudo, M.D.   ANESTHESIA:  General anesthesia.   SPECIMENS:  The specimens to lab were gallbladder with stones.   ESTIMATED BLOOD LOSS:  Was 30 mL.   COMPLICATIONS:  None.  The patient returned to the PACU in excellent  position.   INDICATIONS FOR PROCEDURE:  Craig Lindsey is a 50 year old male with  diagnosed calculous cholecystitis which has become symptomatic.  His  liver function studies have been normal.  He comes to the operating room  after the risks and potential benefits of surgery been fully discussed.  All questions answered, and a consent obtained.   DESCRIPTION OF PROCEDURE:  Following the induction of satisfactory  general anesthesia, the patient is positioned supine and the abdomen was  prepped and draped routinely. An open laparoscopy was created at the  umbilicus with insertion of a Hassan cannula and insufflation peritoneal  cavity up to 14 mmHg pressure using carbon dioxide.  The camera was  inserted and visual exploration carried out of the large and small  intestine which were viewed and appeared to be normal.  The gallbladder  was chronically scarred.  Under direct vision the epigastric and lateral  ports were placed and the gallbladder was grasped and retracted  cephalad.  Adhesions to the gallbladder were taken down and the  dissection carried  down to the ampulla.  At the region of the ampulla,  dissection of the hepatoduodenal ligaments allowed isolation of the  cystic artery and cystic duct.  The cystic duct is traced up to its  entry into the gallbladder and the cystic artery was placed through its  entry into the gallbladder wall. The anatomy could be clearly  visualized.  A proximal clip was placed on the cystic duct,  and the  cystic duct cholangiogram carried out, by placing a Cook catheter  through a cholecystoducotomy.  Injection of one half-strength Renografin  under fluoroscopic control showed a normal cholangiogram.  A free flow  of contrast into the duodenum.  No filling defects.  The upper radicles  appeared to be within normal limits.  The cholangiographic tube was  removed.  The cystic duct was doubly clipped and transected.  The cystic  artery was also doubly clipped and transected.  The gallbladder was  dissected free from the liver bed using electrocautery and maintaining  hemostasis throughout the course of the dissection.  At the end of the  dissection the gallbladder was placed into an Endopouch and retrieved  through the umbilicus without difficulty.  The right upper quadrant was  thoroughly irrigated and aspirated.  Additional bleeding points were  treated with electrocautery.  The lateral trocars were removed under  direct vision, with no bleeding at the orifices.  The umbilical wound  was then closed in two layers after the pneumoperitoneum was allowed to  deflate.  The umbilical wound was closed with #1-0 Vicryl and #4-0  Monocryl.  The epigastric port and removed and the epigastric and flank  wounds were closed with #4-0 Monocryl sutures.  All wounds were  reinforced with Steri-Strips.  Sterile dressings were applied.  The  anesthetic was reversed.   The patient was removed from the operating room to the recovery room in  stable condition and tolerated the procedure well.      Leonie Man,  M.D.  Electronically Signed     PB/MEDQ  D:  04/04/2006  T:  04/04/2006  Job:  [id number]

## 2010-06-03 NOTE — Cardiovascular Report (Signed)
NAME:  Craig Lindsey, Craig Lindsey                        ACCOUNT NO.:  [id number]   MEDICAL RECORD NO.:  [id number]                   PATIENT TYPE:  INP   LOCATION:  3309                                 FACILITY:  [facility]   PHYSICIAN:  Vida Roller, M.D.                DATE OF BIRTH:  02-Jul-1960   DATE OF PROCEDURE:  DATE OF DISCHARGE:                              CARDIAC CATHETERIZATION   PRIMARY CARE PHYSICIAN:  Primary responsible physician:  Dr. Sandrea Hughs  and the critical care team.   PROCEDURES PERFORMED:   CARDIOLOGIST:  Vida Roller, M.D.   INDICATIONS FOR PROCEDURE:  This is a 50 year old gentleman with multiple  medical problems including morbid obesity in an excess of 480 pounds who has  had a relatively long and complicated hospital course, which was most  importantly complicated by ventricular fibrillation and a cardiac arrest;  and, he was referred for elective heart catheterization due to this.  He is  in atrial fibrillation.   DETAILS OF THE PROCEDURE:  After obtaining informed consent the patient was  brought to the cardiac catheterization laboratory in the fasting state.  There is was prepped and draped in the usual sterile manner and local  anesthetic was obtained over the right wrist using 1% lidocaine without  epinephrine.  The right radial artery was cannulated using the modified  Seldinger technique and the left heart catheterization was performed using a  6 French Judkins right #4 and a 6 French Amplatz left #1.   At the completion of the procedure the catheters were removed.   The patient was transported back to the ICU with a Radstat device applying  manual pressure to his wrist for hemostasis.  No hematoma or ecchymosis was  noted.   FLUOROSCOPY TIME:  Total fluoroscopic time was 31.5 minutes.   CONTRAST USED:  Total iodinized contrast 160 mL.   RESULTS:   HEMODYNAMIC DATA:  Aortic pressure 145/11.  Mean arterial pressure of 119.   ANGIOGRAPHIC  DATA:  Coronary Arteries  The left main coronary artery is a large vessel, which is angiographically  normal.   The left anterior descending coronary artery is a large transapical vessel  with two large diagonal branches.  It is angiographically normal.   The left circumflex artery is a small artery, which is angiographically  normal.   The right coronary artery is a large dominant vessel with luminal  irregularities along its proximal portion.  There is a moderate caliber  posterior descending coronary artery, which is angiographically normal.   ASSESSMENT:  Nonobstructive coronary disease status post cardiac arrest.   PLAN:  Our plan is to assess his left ventricular systolic function and do a  DC cardioversion for his atrial fibrillation guided by transesophageal echo.   I have discussed these results with Dr. Lewayne Bunting.  Vida Roller, M.D.    JH/MEDQ  D:  01/28/2003  T:  01/29/2003  Job:  [id number]

## 2010-06-05 ENCOUNTER — Encounter: Payer: Self-pay | Admitting: *Deleted

## 2010-06-14 ENCOUNTER — Ambulatory Visit (INDEPENDENT_AMBULATORY_CARE_PROVIDER_SITE_OTHER): Payer: BC Managed Care – PPO | Admitting: Internal Medicine

## 2010-06-14 DIAGNOSIS — I82409 Acute embolism and thrombosis of unspecified deep veins of unspecified lower extremity: Secondary | ICD-10-CM

## 2010-06-14 DIAGNOSIS — Z7901 Long term (current) use of anticoagulants: Secondary | ICD-10-CM

## 2010-06-14 DIAGNOSIS — Z5181 Encounter for therapeutic drug level monitoring: Secondary | ICD-10-CM

## 2010-06-15 ENCOUNTER — Ambulatory Visit: Payer: BC Managed Care – PPO

## 2010-06-29 ENCOUNTER — Other Ambulatory Visit: Payer: Self-pay | Admitting: *Deleted

## 2010-06-29 MED ORDER — FUROSEMIDE 40 MG PO TABS: 40.0000 mg | ORAL_TABLET | Freq: Every day | ORAL | Status: DC

## 2010-06-29 MED ORDER — DILTIAZEM HCL ER COATED BEADS 240 MG PO CP24: 240.0000 mg | ORAL_CAPSULE | Freq: Every day | ORAL | Status: DC

## 2010-06-29 MED ORDER — AMIODARONE HCL 200 MG PO TABS: 200.0000 mg | ORAL_TABLET | Freq: Every day | ORAL | Status: DC

## 2010-06-29 MED ORDER — SPIRONOLACTONE 50 MG PO TABS: 50.0000 mg | ORAL_TABLET | Freq: Two times a day (BID) | ORAL | Status: DC

## 2010-06-29 MED ORDER — WARFARIN SODIUM 4 MG PO TABS: ORAL_TABLET | ORAL | Status: DC

## 2010-06-29 MED ORDER — METFORMIN HCL 1000 MG PO TABS: 1000.0000 mg | ORAL_TABLET | Freq: Two times a day (BID) | ORAL | Status: DC

## 2010-06-29 MED ORDER — METOPROLOL SUCCINATE ER 100 MG PO TB24: ORAL_TABLET | ORAL | Status: DC

## 2010-06-29 MED ORDER — GLIPIZIDE ER 10 MG PO TB24: 10.0000 mg | ORAL_TABLET | Freq: Every day | ORAL | Status: DC

## 2010-06-29 MED ORDER — POTASSIUM CHLORIDE CRYS ER 20 MEQ PO TBCR: 20.0000 meq | EXTENDED_RELEASE_TABLET | Freq: Every day | ORAL | Status: DC

## 2010-07-05 ENCOUNTER — Encounter: Payer: Self-pay | Admitting: Cardiovascular Disease

## 2010-07-12 ENCOUNTER — Ambulatory Visit: Payer: BC Managed Care – PPO

## 2010-07-13 ENCOUNTER — Ambulatory Visit: Payer: BC Managed Care – PPO

## 2010-07-13 ENCOUNTER — Ambulatory Visit: Payer: Self-pay | Admitting: Internal Medicine

## 2010-07-13 NOTE — Patient Instructions (Signed)
Patient out of town.r/s for 07-25-2010

## 2010-07-25 ENCOUNTER — Ambulatory Visit (INDEPENDENT_AMBULATORY_CARE_PROVIDER_SITE_OTHER): Payer: BC Managed Care – PPO | Admitting: Internal Medicine

## 2010-07-25 DIAGNOSIS — Z7901 Long term (current) use of anticoagulants: Secondary | ICD-10-CM

## 2010-07-25 DIAGNOSIS — Z5181 Encounter for therapeutic drug level monitoring: Secondary | ICD-10-CM

## 2010-07-25 DIAGNOSIS — I82409 Acute embolism and thrombosis of unspecified deep veins of unspecified lower extremity: Secondary | ICD-10-CM

## 2010-07-25 NOTE — Patient Instructions (Signed)
8 mg daily, recheck 4 week

## 2010-08-08 ENCOUNTER — Encounter: Payer: BC Managed Care – PPO | Admitting: Internal Medicine

## 2010-08-22 ENCOUNTER — Ambulatory Visit: Payer: BC Managed Care – PPO

## 2010-09-12 ENCOUNTER — Encounter: Payer: Self-pay | Admitting: Internal Medicine

## 2010-09-13 ENCOUNTER — Ambulatory Visit (INDEPENDENT_AMBULATORY_CARE_PROVIDER_SITE_OTHER): Payer: BC Managed Care – PPO | Admitting: Internal Medicine

## 2010-09-13 ENCOUNTER — Encounter: Payer: Self-pay | Admitting: Internal Medicine

## 2010-09-13 DIAGNOSIS — E1149 Type 2 diabetes mellitus with other diabetic neurological complication: Secondary | ICD-10-CM

## 2010-09-13 DIAGNOSIS — E669 Obesity, unspecified: Secondary | ICD-10-CM

## 2010-09-13 DIAGNOSIS — I10 Essential (primary) hypertension: Secondary | ICD-10-CM

## 2010-09-13 DIAGNOSIS — Z7901 Long term (current) use of anticoagulants: Secondary | ICD-10-CM

## 2010-09-13 DIAGNOSIS — Z0000 Encounter for general adult medical examination without abnormal findings: Secondary | ICD-10-CM | POA: Insufficient documentation

## 2010-09-13 DIAGNOSIS — E785 Hyperlipidemia, unspecified: Secondary | ICD-10-CM

## 2010-09-13 DIAGNOSIS — Z5181 Encounter for therapeutic drug level monitoring: Secondary | ICD-10-CM

## 2010-09-13 DIAGNOSIS — I82409 Acute embolism and thrombosis of unspecified deep veins of unspecified lower extremity: Secondary | ICD-10-CM

## 2010-09-13 LAB — CBC WITH DIFFERENTIAL/PLATELET
Basophils Absolute: 0.1 10*3/uL (ref 0.0–0.1)
Basophils Relative: 0.6 % (ref 0.0–3.0)
Eosinophils Absolute: 0.1 10*3/uL (ref 0.0–0.7)
HCT: 49.8 % (ref 39.0–52.0)
Hemoglobin: 16.8 g/dL (ref 13.0–17.0)
Lymphocytes Relative: 19.1 % (ref 12.0–46.0)
Lymphs Abs: 1.6 10*3/uL (ref 0.7–4.0)
MCHC: 33.8 g/dL (ref 30.0–36.0)
MCV: 92.1 fl (ref 78.0–100.0)
Monocytes Absolute: 0.5 10*3/uL (ref 0.1–1.0)
Neutro Abs: 6 10*3/uL (ref 1.4–7.7)
RDW: 14.2 % (ref 11.5–14.6)

## 2010-09-13 LAB — HEPATIC FUNCTION PANEL
Albumin: 3.9 g/dL (ref 3.5–5.2)
Total Protein: 7.1 g/dL (ref 6.0–8.3)

## 2010-09-13 LAB — LIPID PANEL: Triglycerides: 220 mg/dL — ABNORMAL HIGH (ref 0.0–149.0)

## 2010-09-13 LAB — BASIC METABOLIC PANEL
CO2: 27 mEq/L (ref 19–32)
Calcium: 9.1 mg/dL (ref 8.4–10.5)
Chloride: 102 mEq/L (ref 96–112)
Glucose, Bld: 211 mg/dL — ABNORMAL HIGH (ref 70–99)
Sodium: 138 mEq/L (ref 135–145)

## 2010-09-13 LAB — HEMOGLOBIN A1C: Hgb A1c MFr Bld: 9.9 % — ABNORMAL HIGH (ref 4.6–6.5)

## 2010-09-13 LAB — MICROALBUMIN / CREATININE URINE RATIO
Creatinine,U: 228.6 mg/dL
Microalb, Ur: 25.1 mg/dL — ABNORMAL HIGH (ref 0.0–1.9)

## 2010-09-13 LAB — POCT INR: INR: 3.5

## 2010-09-13 LAB — LDL CHOLESTEROL, DIRECT: Direct LDL: 124.1 mg/dL

## 2010-09-13 LAB — TSH: TSH: 0.93 u[IU]/mL (ref 0.35–5.50)

## 2010-09-13 LAB — PSA: PSA: 0.43 ng/mL (ref 0.10–4.00)

## 2010-09-13 NOTE — Assessment & Plan Note (Signed)
Good candidate for bariatric surgery but insurance doesn't cover

## 2010-09-13 NOTE — Assessment & Plan Note (Signed)
Discuss[facility] Will check PSA after discussion

## 2010-09-13 NOTE — Progress Notes (Signed)
Subjective:    Patient ID: Craig Lindsey, male    DOB: 08-15-1960, 50 y.o.   MRN: [id number]  HPI Doing okay Still out of work--has put in a bunch of applications  Had been in process of evaluation for bariatric surgery Wife's insurance doesn't cover it so he is unable to proceed  Did take injections in past--used lovenox Hasn't needed insulin in past Checks sugars fasting every other day---low 200's Still has "weird feeling" in feet On going edema--takes furosemide daily  Current Outpatient Prescriptions on File Prior to Visit  Medication Sig Dispense Refill  . amiodarone (PACERONE) 200 MG tablet Take 1 tablet (200 mg total) by mouth daily.  60 tablet  6  . furosemide (LASIX) 40 MG tablet Take 1 tablet (40 mg total) by mouth daily.  60 tablet  6  . glipiZIDE (GLUCOTROL) 10 MG 24 hr tablet Take 1 tablet (10 mg total) by mouth daily.  60 tablet  6  . metFORMIN (GLUCOPHAGE) 1000 MG tablet Take 1 tablet (1,000 mg total) by mouth 2 (two) times daily.  120 tablet  6  . metoprolol (TOPROL-XL) 100 MG 24 hr tablet Take one and one half by mouth daily  90 tablet  6  . multivitamin (THERAGRAN) per tablet Take 1 tablet by mouth daily.        . NON FORMULARY Knee high compression hose 20-30 mmhg. Apply daily and remove at bedtime.       . potassium chloride SA (KLOR-CON M20) 20 MEQ tablet Take 1 tablet (20 mEq total) by mouth daily.  60 tablet  6  . spironolactone (ALDACTONE) 50 MG tablet Take 1 tablet (50 mg total) by mouth 2 (two) times daily.  120 tablet  6  . warfarin (COUMADIN) 4 MG tablet Use as directed   120 tablet  6    No Known Allergies  Past Medical History  Diagnosis Date  . Diabetes mellitus     Type II  . Hyperlipidemia   . Hypertension   . Arrhythmia 12/04    1. Ventricular tachycardia; Dr. Ladona Ridgel  2. Atrial fibrillation  . Sleep apnea   . Obesity   . Hemorrhoids   . Personal history of colon cancer, stage III 1/09    Dr. Leone Payor, Truett Perna, D. Newman  .  Neuropathy     Oxaliplatin  . DVT (deep venous thrombosis) 7/09    RLE, Tx Arixtra  . Chronic kidney disease     Complex L renal cyst  . Sigmoid colon ulcer     Sigmoid; Raelyn Mora, MD (colon cancer)    Past Surgical History  Procedure Date  . Cardiac defibrillator placement   . Colectomy 1/09    Sigmoid; Raelyn Mora, MD (colon cancer)  . Cholecystectomy   . Hernia repair     Family History  Problem Relation Age of Onset  . Diabetes Father   . Diabetes Maternal Grandmother   . Hypertension Neg Hx   . Coronary artery disease Neg Hx   . Prostate cancer Neg Hx   . Colon cancer Other     History   Social History  . Marital Status: Married    Spouse Name: N/A    Number of Children: 2  . Years of Education: N/A   Occupational History  . [employer] of work   Social History Main Topics  . Smoking status: Never Smoker   . Smokeless tobacco: Never Used  . Alcohol Use: Yes  Occasionally  . Drug Use: No  . Sexually Active: Not on file   Other Topics Concern  . Not on file   Social History Narrative   2 step children from wife's 2nd marriage.   Review of Systems  Constitutional: Negative for fatigue and unexpected weight change.       Wears seat belt  HENT: Negative for hearing loss, congestion, rhinorrhea, dental problem and tinnitus.        Regular with dentist  Eyes: Negative for visual disturbance.       No diplopia or focal vision loss Has eye exam next week  Respiratory: Negative for cough, chest tightness and shortness of breath.   Cardiovascular: Positive for leg swelling. Negative for chest pain and palpitations.  Gastrointestinal: Negative for nausea, vomiting, abdominal pain, constipation and blood in stool.       No sig heartburn Does have some rectal urgency  Genitourinary: Positive for difficulty urinating. Negative for dysuria and urgency.       Sllight dribbling  No regular nocturia No sexual problems  Musculoskeletal: Positive  for arthralgias. Negative for back pain and joint swelling.       Ongoing knee pain---uses tylenol  Skin: Negative for rash.       No suspicious lesions  Neurological: Positive for numbness. Negative for dizziness, syncope, weakness, light-headedness and headaches.  Hematological: Negative for adenopathy. Does not bruise/bleed easily.  Psychiatric/Behavioral: Negative for sleep disturbance and dysphoric mood. The patient is not nervous/anxious.        Objective:   Physical Exam  Constitutional: He appears well-developed and well-nourished. No distress.  HENT:  Head: Normocephalic and atraumatic.  Right Ear: External ear normal.  Left Ear: External ear normal.  Mouth/Throat: Oropharynx is clear and moist. No oropharyngeal exudate.       TMs normal  Eyes: Conjunctivae and EOM are normal. Pupils are equal, round, and reactive to light.  Neck: Normal range of motion. No thyromegaly present.  Cardiovascular: Regular rhythm, normal heart sounds and intact distal pulses.  Exam reveals no gallop.   No murmur heard.      bradycardic  Pulmonary/Chest: Effort normal and breath sounds normal. No respiratory distress. He has no wheezes. He has no rales.  Abdominal: Soft. There is no tenderness.  Musculoskeletal:       Thick ankles without sig pitting  Lymphadenopathy:    He has no cervical adenopathy.  Neurological:       Fine touch sensation fairly good on plantar feet  Skin: Skin is warm.       Venous stasis changes in both calves No ulcers  Psychiatric: He has a normal mood and affect. His behavior is normal. Judgment and thought content normal.       Sedate but not really depressed          Assessment & Plan:

## 2010-09-13 NOTE — Patient Instructions (Signed)
Continue 8 mg daily, recheck 4 weeks 

## 2010-09-13 NOTE — Assessment & Plan Note (Signed)
continued poor control Craig Lindsey recheck A1c but if still >9%, Craig Lindsey start lantus. Plan to [facility] to [facility] for [facility]selling and initiation of insulin

## 2010-09-13 NOTE — Assessment & Plan Note (Signed)
suboptimal control Bradycardic so can't increase metoprolol Some edema with CCB so no increase ??clonidine No change for now BP Readings from Last 3 Encounters:  09/13/10 148/76  03/16/10 153/85  11/02/09 158/82

## 2010-09-13 NOTE — Assessment & Plan Note (Signed)
No results found for this basename: LDLCALC   Will recheck Goal LDL <100 Consider meds if >914 (he wants to wait)

## 2010-09-14 NOTE — Progress Notes (Signed)
Addended by: Tillman Abide I on: 09/14/2010 08:53 AM   Modules accepted: Orders

## 2010-09-16 ENCOUNTER — Encounter: Payer: Self-pay | Admitting: *Deleted

## 2010-09-16 ENCOUNTER — Telehealth: Payer: Self-pay | Admitting: *Deleted

## 2010-09-16 NOTE — Telephone Encounter (Signed)
Added rx for lantus

## 2010-09-22 ENCOUNTER — Encounter: Payer: Self-pay | Admitting: *Deleted

## 2010-09-29 ENCOUNTER — Ambulatory Visit (HOSPITAL_COMMUNITY)
Admission: RE | Admit: 2010-09-29 | Discharge: 2010-09-29 | Disposition: A | Discharge status: Home / Self Care | Payer: BC Managed Care – PPO | Source: Ambulatory Visit | Attending: Oncology | Admitting: Oncology

## 2010-09-29 ENCOUNTER — Other Ambulatory Visit: Payer: Self-pay | Admitting: Oncology

## 2010-09-29 ENCOUNTER — Encounter (HOSPITAL_BASED_OUTPATIENT_CLINIC_OR_DEPARTMENT_OTHER): Payer: BC Managed Care – PPO | Admitting: Oncology

## 2010-09-29 DIAGNOSIS — C189 Malignant neoplasm of colon, unspecified: Secondary | ICD-10-CM | POA: Insufficient documentation

## 2010-09-29 DIAGNOSIS — Z86718 Personal history of other venous thrombosis and embolism: Secondary | ICD-10-CM

## 2010-09-29 DIAGNOSIS — Q619 Cystic kidney disease, unspecified: Secondary | ICD-10-CM | POA: Insufficient documentation

## 2010-09-29 DIAGNOSIS — C187 Malignant neoplasm of sigmoid colon: Secondary | ICD-10-CM

## 2010-09-29 DIAGNOSIS — K7689 Other specified diseases of liver: Secondary | ICD-10-CM

## 2010-09-29 DIAGNOSIS — Z7901 Long term (current) use of anticoagulants: Secondary | ICD-10-CM

## 2010-09-29 DIAGNOSIS — J984 Other disorders of lung: Secondary | ICD-10-CM | POA: Insufficient documentation

## 2010-09-29 DIAGNOSIS — Z9089 Acquired absence of other organs: Secondary | ICD-10-CM | POA: Insufficient documentation

## 2010-09-29 DIAGNOSIS — K429 Umbilical hernia without obstruction or gangrene: Secondary | ICD-10-CM | POA: Insufficient documentation

## 2010-09-29 LAB — COMPREHENSIVE METABOLIC PANEL
ALT: 68 U/L — ABNORMAL HIGH (ref 0–53)
AST: 36 U/L (ref 0–37)
Creatinine, Ser: 1.01 mg/dL (ref 0.50–1.35)
Total Bilirubin: 0.5 mg/dL (ref 0.3–1.2)

## 2010-09-29 LAB — CEA: CEA: 1.6 ng/mL (ref 0.0–5.0)

## 2010-09-29 MED ORDER — IOHEXOL 300 MG/ML  SOLN
150.0000 mL | Freq: Once | INTRAMUSCULAR | Status: AC | PRN
  Administered 2010-09-29: 150 mL via INTRAVENOUS

## 2010-09-30 ENCOUNTER — Other Ambulatory Visit: Payer: Self-pay | Admitting: *Deleted

## 2010-09-30 MED ORDER — INSULIN GLARGINE 100 UNIT/ML ~~LOC~~ SOLN: 20.0000 [IU] | Freq: Every day | SUBCUTANEOUS | Status: DC

## 2010-09-30 MED ORDER — "INSULIN SYRINGE-NEEDLE U-100 29G X 1/2"" 0.3 ML MISC": 20.0000 [IU] | Status: DC

## 2010-10-03 ENCOUNTER — Ambulatory Visit: Payer: Self-pay | Admitting: Internal Medicine

## 2010-10-04 ENCOUNTER — Other Ambulatory Visit: Payer: Self-pay | Admitting: *Deleted

## 2010-10-04 MED ORDER — ONETOUCH ULTRASOFT LANCETS MISC: Status: AC

## 2010-10-04 MED ORDER — GLUCOSE BLOOD VI STRP: ORAL_STRIP | Status: AC

## 2010-10-06 ENCOUNTER — Other Ambulatory Visit: Payer: Self-pay | Admitting: *Deleted

## 2010-10-06 ENCOUNTER — Encounter (HOSPITAL_BASED_OUTPATIENT_CLINIC_OR_DEPARTMENT_OTHER): Payer: BC Managed Care – PPO | Admitting: Oncology

## 2010-10-06 DIAGNOSIS — Z85038 Personal history of other malignant neoplasm of large intestine: Secondary | ICD-10-CM

## 2010-10-06 DIAGNOSIS — Z86718 Personal history of other venous thrombosis and embolism: Secondary | ICD-10-CM

## 2010-10-06 DIAGNOSIS — Z7901 Long term (current) use of anticoagulants: Secondary | ICD-10-CM

## 2010-10-06 LAB — COMPREHENSIVE METABOLIC PANEL
ALT: 52
AST: 31
Albumin: 3.8
Calcium: 9.6
GFR calc Af Amer: >60
Potassium: 5
Sodium: 136
Total Protein: 7.2

## 2010-10-06 LAB — CBC
HCT: 42.3
HCT: 44.4
Hemoglobin: 14.3
Hemoglobin: 14.8
Hemoglobin: 15.1
MCHC: 33.5
MCHC: 34
MCV: 87.9
Platelets: 190
Platelets: 207
Platelets: 217
RBC: 4.99
RDW: 14.1
RDW: 14.9
RDW: 14.9
WBC: 10.6 — ABNORMAL HIGH
WBC: 15.9 — ABNORMAL HIGH

## 2010-10-06 LAB — BASIC METABOLIC PANEL
BUN: 10
CO2: 20
Calcium: 8.2 — ABNORMAL LOW
Calcium: 8.2 — ABNORMAL LOW
GFR calc non Af Amer: >60
GFR calc non Af Amer: >60
Glucose, Bld: 178 — ABNORMAL HIGH
Glucose, Bld: 196 — ABNORMAL HIGH
Potassium: 4.6
Sodium: 133 — ABNORMAL LOW
Sodium: 136

## 2010-10-06 LAB — PROTIME-INR
INR: 1
INR: 1.3
Prothrombin Time: 13.5
Prothrombin Time: 16.2 — ABNORMAL HIGH

## 2010-10-06 LAB — DIFFERENTIAL
Eosinophils Relative: 2
Lymphocytes Relative: 17
Lymphs Abs: 1.8
Neutrophils Relative %: 74

## 2010-10-06 NOTE — Telephone Encounter (Signed)
Patient called back and we got the correct size, pt doesn't need refills now, he will call the pharmacy when needed.

## 2010-10-06 NOTE — Telephone Encounter (Signed)
Patient calling asking for refill of lancets, tests strips and pen needles, the lancets and test strips where sent in 10/04/10. I don't know what size pen needles pt is asking for.  .left message to have patient return my call.

## 2010-10-07 LAB — BASIC METABOLIC PANEL
BUN: 13
Calcium: 8.1 — ABNORMAL LOW
Creatinine, Ser: 1.01
GFR calc non Af Amer: >60
Glucose, Bld: 158 — ABNORMAL HIGH
Sodium: 138

## 2010-10-07 LAB — CBC
Platelets: 199
RDW: 15

## 2010-10-10 LAB — URINALYSIS, ROUTINE W REFLEX MICROSCOPIC
Nitrite: NEGATIVE
Protein, ur: NEGATIVE
Specific Gravity, Urine: 1.026
Urobilinogen, UA: 0.2

## 2010-10-10 LAB — BASIC METABOLIC PANEL
Calcium: 9.4
Creatinine, Ser: 0.93
GFR calc Af Amer: >60
GFR calc non Af Amer: >60
Sodium: 135

## 2010-10-10 LAB — HEMOGLOBIN AND HEMATOCRIT, BLOOD: HCT: 42.4

## 2010-10-10 LAB — PROTIME-INR: Prothrombin Time: 13.4

## 2010-10-14 LAB — CBC
HCT: 41.1
Hemoglobin: 13.9
Hemoglobin: 14.9
MCHC: 33.8
MCHC: 33.8
MCV: 96
MCV: 96.5
RBC: 4.28
RBC: 4.57
RDW: 17.8 — ABNORMAL HIGH
RDW: 18.2 — ABNORMAL HIGH

## 2010-10-14 LAB — PROTIME-INR: INR: 2.3 — ABNORMAL HIGH

## 2010-10-14 LAB — BASIC METABOLIC PANEL
CO2: 26
Calcium: 8.7
Chloride: 98
Creatinine, Ser: 1.16
Glucose, Bld: 311 — ABNORMAL HIGH
Sodium: 131 — ABNORMAL LOW

## 2010-10-17 ENCOUNTER — Ambulatory Visit: Payer: Self-pay | Admitting: Internal Medicine

## 2010-10-25 ENCOUNTER — Ambulatory Visit (INDEPENDENT_AMBULATORY_CARE_PROVIDER_SITE_OTHER): Payer: BC Managed Care – PPO | Admitting: Internal Medicine

## 2010-10-25 DIAGNOSIS — Z5181 Encounter for therapeutic drug level monitoring: Secondary | ICD-10-CM

## 2010-10-25 DIAGNOSIS — Z7901 Long term (current) use of anticoagulants: Secondary | ICD-10-CM

## 2010-10-25 DIAGNOSIS — I82409 Acute embolism and thrombosis of unspecified deep veins of unspecified lower extremity: Secondary | ICD-10-CM

## 2010-10-25 NOTE — Patient Instructions (Signed)
Continue 8 mg daily, except 6 mg every Tues recheck 2 weeks ( reduced by 2 mg )

## 2010-11-02 ENCOUNTER — Encounter: Payer: BC Managed Care – PPO | Admitting: Internal Medicine

## 2010-11-08 ENCOUNTER — Ambulatory Visit (INDEPENDENT_AMBULATORY_CARE_PROVIDER_SITE_OTHER): Payer: BC Managed Care – PPO | Admitting: Internal Medicine

## 2010-11-08 DIAGNOSIS — Z5181 Encounter for therapeutic drug level monitoring: Secondary | ICD-10-CM

## 2010-11-08 DIAGNOSIS — I82409 Acute embolism and thrombosis of unspecified deep veins of unspecified lower extremity: Secondary | ICD-10-CM

## 2010-11-08 DIAGNOSIS — Z7901 Long term (current) use of anticoagulants: Secondary | ICD-10-CM

## 2010-11-08 LAB — POCT INR: INR: 3.8

## 2010-11-08 NOTE — Patient Instructions (Signed)
Continue 8 mg daily, except 6 mg every Tues and Thurs recheck 2 weeks ( reduced by another  2 mg today )

## 2010-11-17 ENCOUNTER — Encounter: Payer: Self-pay | Admitting: Internal Medicine

## 2010-11-17 ENCOUNTER — Ambulatory Visit: Payer: Self-pay | Admitting: Internal Medicine

## 2010-11-17 ENCOUNTER — Ambulatory Visit (INDEPENDENT_AMBULATORY_CARE_PROVIDER_SITE_OTHER): Payer: BC Managed Care – PPO | Admitting: Internal Medicine

## 2010-11-17 DIAGNOSIS — Z95810 Presence of automatic (implantable) cardiac defibrillator: Secondary | ICD-10-CM

## 2010-11-17 DIAGNOSIS — I472 Ventricular tachycardia: Secondary | ICD-10-CM

## 2010-11-17 DIAGNOSIS — I10 Essential (primary) hypertension: Secondary | ICD-10-CM

## 2010-11-17 DIAGNOSIS — I509 Heart failure, unspecified: Secondary | ICD-10-CM

## 2010-11-17 DIAGNOSIS — I4891 Unspecified atrial fibrillation: Secondary | ICD-10-CM

## 2010-11-17 LAB — ICD DEVICE OBSERVATION
CHARGE TIME: 8.9 s
DEV-0020ICD: NEGATIVE
RV LEAD IMPEDENCE ICD: 665 Ohm
RV LEAD THRESHOLD: 0.5 V
TZAT-0001FASTVT: 1
TZAT-0004SLOWVT: 8
TZAT-0004SLOWVT: 8
TZAT-0005SLOWVT: 88 pct
TZAT-0005SLOWVT: 91 pct
TZAT-0012FASTVT: 200 ms
TZAT-0012SLOWVT: 200 ms
TZAT-0012SLOWVT: 200 ms
TZAT-0013SLOWVT: 3
TZAT-0013SLOWVT: 3
TZAT-0018FASTVT: NEGATIVE
TZAT-0019FASTVT: 8 V
TZAT-0020FASTVT: 1.5 ms
TZAT-0020SLOWVT: 1.5 ms
TZAT-0020SLOWVT: 1.5 ms
TZON-0003SLOWVT: 340 ms
TZON-0003VSLOWVT: 450 ms
TZON-0004SLOWVT: 28
TZON-0004VSLOWVT: 20
TZST-0001FASTVT: 2
TZST-0001FASTVT: 6
TZST-0001SLOWVT: 3
TZST-0001SLOWVT: 5
TZST-0001SLOWVT: 6
TZST-0002FASTVT: NEGATIVE
TZST-0002FASTVT: NEGATIVE
TZST-0002FASTVT: NEGATIVE
TZST-0003SLOWVT: 20 J
TZST-0003SLOWVT: 35 J

## 2010-11-17 NOTE — Progress Notes (Signed)
HPI Craig Lindsey returns today for followup. He is a very pleasant morbidly obese 50 year old man with a history of recurrent ventricular fibrillation, status post ICD implantation. He also has a history of colon cancer status post chemotherapy and surgery. He is now 2 years tumor free. The patient denies chest pain. He does note dyspnea with exertion particularly if he is climbing a grade. Unfortunately he remains morbidly obese weighing over 400 pounds. He admits to dietary indiscretion. He does have modest arthritis in his lower extremities. He has developed diabetes. He has paroxysmal atrial fibrillation but is maintaining sinus rhythm predominantly. No Known Allergies   Current Outpatient Prescriptions  Medication Sig Dispense Refill  . amiodarone (PACERONE) 200 MG tablet Take 1 tablet (200 mg total) by mouth daily.  60 tablet  6  . diltiazem (DILACOR XR) 240 MG 24 hr capsule Take 240 mg by mouth daily.       . furosemide (LASIX) 40 MG tablet Take 1 tablet (40 mg total) by mouth daily.  60 tablet  6  . glipiZIDE (GLUCOTROL) 10 MG 24 hr tablet Take 1 tablet (10 mg total) by mouth daily.  60 tablet  6  . glucose blood (ONE TOUCH ULTRA TEST) test strip PRN  60 each  5  . insulin glargine (LANTUS) 100 UNIT/ML injection Inject 26 Units into the skin at bedtime.        . Insulin Pen Needle 29G X 12.7MM MISC Inject 20 Units into the muscle daily.        . Lancets (ONETOUCH ULTRASOFT) lancets PRN  60 each  5  . metFORMIN (GLUCOPHAGE) 1000 MG tablet Take 1 tablet (1,000 mg total) by mouth 2 (two) times daily.  120 tablet  6  . metoprolol (TOPROL-XL) 100 MG 24 hr tablet Take one and one half by mouth daily  90 tablet  6  . multivitamin (THERAGRAN) per tablet Take 1 tablet by mouth daily.        . NON FORMULARY Knee high compression hose 20-30 mmhg. Apply daily and remove at bedtime.       . potassium chloride SA (KLOR-CON M20) 20 MEQ tablet Take 1 tablet (20 mEq total) by mouth daily.  60 tablet  6  .  spironolactone (ALDACTONE) 50 MG tablet Take 1 tablet (50 mg total) by mouth 2 (two) times daily.  120 tablet  6  . warfarin (COUMADIN) 4 MG tablet Use as directed   120 tablet  6     Past Medical History  Diagnosis Date  . Diabetes mellitus     Type II  . Hyperlipidemia   . Hypertension   . Arrhythmia 12/04    1. Ventricular tachycardia; Dr. Ladona Ridgel  2. Atrial fibrillation  . Sleep apnea   . Obesity   . Hemorrhoids   . Personal history of colon cancer, stage III 1/09    Dr. Leone Payor, Truett Perna, D. Newman  . Neuropathy     Oxaliplatin  . DVT (deep venous thrombosis) 7/09    RLE, Tx Arixtra  . Chronic kidney disease     Complex L renal cyst  . Sigmoid colon ulcer     Sigmoid; Raelyn Mora, MD (colon cancer)    ROS:   All systems reviewed and negative except as noted in the HPI.   Past Surgical History  Procedure Date  . Cardiac defibrillator placement   . Colectomy 1/09    Sigmoid; Raelyn Mora, MD (colon cancer)  . Cholecystectomy   . Hernia repair  Family History  Problem Relation Age of Onset  . Diabetes Father   . Diabetes Maternal Grandmother   . Hypertension Neg Hx   . Coronary artery disease Neg Hx   . Prostate cancer Neg Hx   . Colon cancer Other      History   Social History  . Marital Status: Married    Spouse Name: N/A    Number of Children: 2  . Years of Education: N/A   Occupational History  . [employer] of work   Social History Main Topics  . Smoking status: Never Smoker   . Smokeless tobacco: Never Used  . Alcohol Use: Yes     Occasionally  . Drug Use: No  . Sexually Active: Not on file   Other Topics Concern  . Not on file   Social History Narrative   2 step children from wife's 2nd marriage.     BP 158/64  Pulse 70  Ht 6' (1.829 m)  Wt 418 lb (189.604 kg)  BMI 56.69 kg/m2  Physical Exam:  Well appearing morbidly obese man, NAD HEENT: Unremarkable Neck:  No JVD, no thyromegally Lymphatics:  No  adenopathy Back:  No CVA tenderness Lungs:  Clear with no wheezes, rales, or rhonchi. Well-healed ICD incision. HEART:  Regular rate rhythm, no murmurs, no rubs, no clicks. Heart sounds are somewhat distant. Abd:  soft, positive bowel sounds, no organomegally, no rebound, no guarding Ext:  2 plus pulses, no edema, no cyanosis, no clubbing Skin:  No rashes no nodules Neuro:  CN II through XII intact, motor grossly intact DEVICE  Normal device function.  See PaceArt for details.   Assess/Plan:

## 2010-11-17 NOTE — Assessment & Plan Note (Signed)
His blood pressure remains elevated despite medical therapy. His weight continues to climb. I suspect we will have no chance of controlling his blood pressure until he starts to lose some weight. I recommended he consider a vegetarian diet and a very low-sodium diet.

## 2010-11-17 NOTE — Assessment & Plan Note (Signed)
He has had no recurrent ventricular arrhythmias since his last clinic visit. He will undergo watchful waiting.

## 2010-11-17 NOTE — Assessment & Plan Note (Signed)
His device is working normally. His 6949-lead appears to be functioning satisfactorily. I have recommended watchful waiting.

## 2010-11-22 ENCOUNTER — Ambulatory Visit: Payer: BC Managed Care – PPO

## 2010-11-23 ENCOUNTER — Ambulatory Visit (INDEPENDENT_AMBULATORY_CARE_PROVIDER_SITE_OTHER): Payer: BC Managed Care – PPO | Admitting: Internal Medicine

## 2010-11-23 DIAGNOSIS — Z5181 Encounter for therapeutic drug level monitoring: Secondary | ICD-10-CM

## 2010-11-23 DIAGNOSIS — I82409 Acute embolism and thrombosis of unspecified deep veins of unspecified lower extremity: Secondary | ICD-10-CM

## 2010-11-23 DIAGNOSIS — Z7901 Long term (current) use of anticoagulants: Secondary | ICD-10-CM

## 2010-11-23 LAB — POCT INR: INR: 3.9

## 2010-11-23 NOTE — Patient Instructions (Addendum)
Hold today, then 6 mg daily, except 8 mg MWF (none  today ) 3 weeks. Patient will be out of town, seeing the Dr 16.10RU also.

## 2010-11-30 ENCOUNTER — Other Ambulatory Visit: Payer: Self-pay | Admitting: Internal Medicine

## 2010-12-06 ENCOUNTER — Other Ambulatory Visit: Payer: Self-pay | Admitting: Internal Medicine

## 2010-12-14 ENCOUNTER — Ambulatory Visit (INDEPENDENT_AMBULATORY_CARE_PROVIDER_SITE_OTHER): Payer: BC Managed Care – PPO | Admitting: Internal Medicine

## 2010-12-14 ENCOUNTER — Encounter: Payer: Self-pay | Admitting: Internal Medicine

## 2010-12-14 DIAGNOSIS — Z7901 Long term (current) use of anticoagulants: Secondary | ICD-10-CM

## 2010-12-14 DIAGNOSIS — I10 Essential (primary) hypertension: Secondary | ICD-10-CM

## 2010-12-14 DIAGNOSIS — E1149 Type 2 diabetes mellitus with other diabetic neurological complication: Secondary | ICD-10-CM

## 2010-12-14 DIAGNOSIS — G5790 Unspecified mononeuropathy of unspecified lower limb: Secondary | ICD-10-CM

## 2010-12-14 DIAGNOSIS — Z5181 Encounter for therapeutic drug level monitoring: Secondary | ICD-10-CM

## 2010-12-14 DIAGNOSIS — I82409 Acute embolism and thrombosis of unspecified deep veins of unspecified lower extremity: Secondary | ICD-10-CM

## 2010-12-14 LAB — HEMOGLOBIN A1C: Hgb A1c MFr Bld: 7.6 % — ABNORMAL HIGH (ref 4.6–6.5)

## 2010-12-14 LAB — HEPATIC FUNCTION PANEL
Albumin: 4 g/dL (ref 3.5–5.2)
Total Bilirubin: 0.5 mg/dL (ref 0.3–1.2)

## 2010-12-14 LAB — POCT INR: INR: 3

## 2010-12-14 MED ORDER — LISINOPRIL 20 MG PO TABS: 20.0000 mg | ORAL_TABLET | Freq: Every day | ORAL | Status: DC

## 2010-12-14 NOTE — Patient Instructions (Signed)
Continue current dose, check in 4 weeks  

## 2010-12-14 NOTE — Patient Instructions (Signed)
Please start the lisinopril by cutting the first tab in half and taking 1/2 for the first 2 days. If no dizziness, or other problems after 2 days, then increase to full tab daily Please stop the potassium supplement Please set up blood work in 2-3 weeks (met b--401.9)

## 2010-12-14 NOTE — Progress Notes (Signed)
Subjective:    Patient ID: Craig Lindsey, male    DOB: 1960-12-08, 50 y.o.   MRN: [id number]  HPI Has done okay with the insulin Up to 26 units of lantus Did the lifestyle counseling Has adjusted eating Has lost 7# in 3 months despite starting insulin Checks sugars twice a day Fasting 120-145 2 hours after dinner-- 170-190  No exercise Has feet pain just standing He plans to try water exercises--encouraged  No chest pain No SOB Stable edema --still uses the support hose occ feels a racing heart---with exertion like going up steps  Current Outpatient Prescriptions on File Prior to Visit  Medication Sig Dispense Refill  . amiodarone (PACERONE) 200 MG tablet Take 1 tablet (200 mg total) by mouth daily.  60 tablet  6  . diltiazem (DILACOR XR) 240 MG 24 hr capsule Take 240 mg by mouth daily.       . furosemide (LASIX) 40 MG tablet Take 1 tablet (40 mg total) by mouth daily.  60 tablet  6  . glipiZIDE (GLUCOTROL) 10 MG 24 hr tablet Take 1 tablet (10 mg total) by mouth daily.  60 tablet  6  . glucose blood (ONE TOUCH ULTRA TEST) test strip PRN  60 each  5  . insulin glargine (LANTUS) 100 UNIT/ML injection Inject 26 Units into the skin at bedtime.        . Insulin Pen Needle 29G X 12.7MM MISC Inject 20 Units into the muscle daily.        . Lancets (ONETOUCH ULTRASOFT) lancets PRN  60 each  5  . metFORMIN (GLUCOPHAGE) 1000 MG tablet Take 1 tablet (1,000 mg total) by mouth 2 (two) times daily.  120 tablet  6  . metoprolol (TOPROL-XL) 100 MG 24 hr tablet Take one and one half by mouth daily  90 tablet  6  . multivitamin (THERAGRAN) per tablet Take 1 tablet by mouth daily.        . NON FORMULARY Knee high compression hose 20-30 mmhg. Apply daily and remove at bedtime.       . potassium chloride SA (KLOR-CON M20) 20 MEQ tablet Take 1 tablet (20 mEq total) by mouth daily.  60 tablet  6  . spironolactone (ALDACTONE) 50 MG tablet Take 1 tablet (50 mg total) by mouth 2 (two) times daily.  120  tablet  6  . warfarin (COUMADIN) 4 MG tablet Use as directed   120 tablet  6    No Known Allergies  Past Medical History  Diagnosis Date  . Diabetes mellitus     Type II  . Hyperlipidemia   . Hypertension   . Arrhythmia 12/04    1. Ventricular tachycardia; Dr. Ladona Ridgel  2. Atrial fibrillation  . Sleep apnea   . Obesity   . Hemorrhoids   . Personal history of colon cancer, stage III 1/09    Dr. Leone Payor, Truett Perna, D. Newman  . Neuropathy     Oxaliplatin  . DVT (deep venous thrombosis) 7/09    RLE, Tx Arixtra  . Chronic kidney disease     Complex L renal cyst  . Sigmoid colon ulcer     Sigmoid; Raelyn Mora, MD (colon cancer)    Past Surgical History  Procedure Date  . Cardiac defibrillator placement   . Colectomy 1/09    Sigmoid; Raelyn Mora, MD (colon cancer)  . Cholecystectomy   . Hernia repair     Family History  Problem Relation Age of Onset  . Diabetes  Father   . Diabetes Maternal Grandmother   . Hypertension Neg Hx   . Coronary artery disease Neg Hx   . Prostate cancer Neg Hx   . Colon cancer Other     History   Social History  . Marital Status: Married    Spouse Name: N/A    Number of Children: 2  . Years of Education: N/A   Occupational History  . [employer] of work   Social History Main Topics  . Smoking status: Never Smoker   . Smokeless tobacco: Never Used  . Alcohol Use: Yes     Occasionally  . Drug Use: No  . Sexually Active: Not on file   Other Topics Concern  . Not on file   Social History Narrative   2 step children from wife's 2nd marriage.   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 No distress.  Neck: Normal range of motion. Neck supple.  Cardiovascular: Normal rate, regular rhythm and normal heart sounds.  Exam reveals no gallop.     No murmur heard. Pulmonary/Chest: Effort normal and breath sounds normal. No respiratory distress. He has no wheezes. He has no rales.  Musculoskeletal:       Slight edema  Lymphadenopathy:    He has no cervical adenopathy.  Psychiatric: He has a normal mood and affect. His behavior is normal. Judgment and thought content normal.          Assessment & Plan:

## 2010-12-14 NOTE — Assessment & Plan Note (Signed)
Has limited exercise due to pain Discussed starting pool exercises to improve fitness

## 2010-12-14 NOTE — Assessment & Plan Note (Signed)
Seems to have better  control on the insulin Has lost weight also so seems to have benefitted from the counselling Goal A1c under 8%  Lab Results  Component Value Date   HGBA1C 9.9* 09/13/2010

## 2010-12-14 NOTE — Assessment & Plan Note (Signed)
BP Readings from Last 3 Encounters:  12/14/10 152/85  11/17/10 158/64  09/13/10 148/76   Persistent elevations Had been on ACEI in past Will restart lisinopril and stop potassium Check levels soon

## 2011-01-12 ENCOUNTER — Ambulatory Visit (INDEPENDENT_AMBULATORY_CARE_PROVIDER_SITE_OTHER): Payer: BC Managed Care – PPO | Admitting: Internal Medicine

## 2011-01-12 DIAGNOSIS — Z7901 Long term (current) use of anticoagulants: Secondary | ICD-10-CM

## 2011-01-12 DIAGNOSIS — I82409 Acute embolism and thrombosis of unspecified deep veins of unspecified lower extremity: Secondary | ICD-10-CM

## 2011-01-12 DIAGNOSIS — Z5181 Encounter for therapeutic drug level monitoring: Secondary | ICD-10-CM

## 2011-01-12 LAB — POCT INR: INR: 2.5

## 2011-01-12 NOTE — Patient Instructions (Signed)
Continue current dose, check in 4 weeks  

## 2011-02-09 ENCOUNTER — Telehealth: Payer: Self-pay | Admitting: Oncology

## 2011-02-09 ENCOUNTER — Ambulatory Visit: Payer: BC Managed Care – PPO

## 2011-02-09 NOTE — Telephone Encounter (Signed)
lmonvm adviisng the pt of his march 2013 appts °

## 2011-02-14 ENCOUNTER — Ambulatory Visit (INDEPENDENT_AMBULATORY_CARE_PROVIDER_SITE_OTHER): Payer: BC Managed Care – PPO | Admitting: Internal Medicine

## 2011-02-14 DIAGNOSIS — I82409 Acute embolism and thrombosis of unspecified deep veins of unspecified lower extremity: Secondary | ICD-10-CM

## 2011-02-14 DIAGNOSIS — Z5181 Encounter for therapeutic drug level monitoring: Secondary | ICD-10-CM

## 2011-02-14 DIAGNOSIS — Z7901 Long term (current) use of anticoagulants: Secondary | ICD-10-CM

## 2011-02-14 NOTE — Patient Instructions (Addendum)
Continue 6 mg daily, except 8 mg MWF

## 2011-02-16 ENCOUNTER — Encounter: Payer: BC Managed Care – PPO | Admitting: *Deleted

## 2011-02-20 ENCOUNTER — Encounter: Payer: Self-pay | Admitting: *Deleted

## 2011-03-10 ENCOUNTER — Ambulatory Visit: Payer: BC Managed Care – PPO | Admitting: Internal Medicine

## 2011-03-10 ENCOUNTER — Ambulatory Visit: Payer: BC Managed Care – PPO

## 2011-03-14 ENCOUNTER — Ambulatory Visit: Payer: BC Managed Care – PPO

## 2011-03-15 ENCOUNTER — Ambulatory Visit (INDEPENDENT_AMBULATORY_CARE_PROVIDER_SITE_OTHER): Payer: BC Managed Care – PPO | Admitting: Internal Medicine

## 2011-03-15 DIAGNOSIS — Z5181 Encounter for therapeutic drug level monitoring: Secondary | ICD-10-CM

## 2011-03-15 DIAGNOSIS — I82409 Acute embolism and thrombosis of unspecified deep veins of unspecified lower extremity: Secondary | ICD-10-CM

## 2011-03-15 DIAGNOSIS — Z7901 Long term (current) use of anticoagulants: Secondary | ICD-10-CM

## 2011-03-15 LAB — POCT INR: INR: 2.2

## 2011-03-15 NOTE — Patient Instructions (Signed)
Increase to 8 mg daily, 6 mg Tues, Thurs recheck 2 weeks

## 2011-03-29 ENCOUNTER — Encounter (INDEPENDENT_AMBULATORY_CARE_PROVIDER_SITE_OTHER): Payer: Self-pay | Admitting: Surgery

## 2011-03-29 ENCOUNTER — Ambulatory Visit (INDEPENDENT_AMBULATORY_CARE_PROVIDER_SITE_OTHER): Payer: BC Managed Care – PPO | Admitting: Surgery

## 2011-03-29 VITALS — BP 144/88 | HR 76 | Temp 97.7°F | Resp 20 | Ht 76.0 in | Wt >= 6400 oz

## 2011-03-29 DIAGNOSIS — K439 Ventral hernia without obstruction or gangrene: Secondary | ICD-10-CM

## 2011-03-29 DIAGNOSIS — Z85038 Personal history of other malignant neoplasm of large intestine: Secondary | ICD-10-CM

## 2011-03-29 NOTE — Progress Notes (Signed)
Re:   Craig Lindsey DOB:   Mar 31, 1960 MRN:   [id number]  ASSESSMENT AND PLAN: 1.  Ventral incisional hernia  Upper incision to the right.  Very difficult to examine because of his weight.  I will plan to see him back in one year.  There is no need to do imaging until there are plans to fix the hernia.  There is no reason to fix the hernia until he loses weight.  2.  Morbid obesity.  Has gained weight since last year.  I spent most of my time talking to him about this.  He's been out of work for almost 3 years.  Can't exercise because of Craig pain. 3.  Sigmoid colon resection for adenoca, T3, N1 (1/12 nodes) - 02/13/2007.  GI - Craig Lindsey.  Oncology - Craig Lindsey.  Disease free.  4.  History of ventricular fibrillation - followed by Craig Lindsey 5.  AICD in place.  Replaced in 2012. 6.  Coumadin. 7.  Diabetes Mellitus.  Now on insulin. 8.  Sleep apnea. 9. Complex left renal cyst.    REFERRING PHYSICIAN: Tillman Abide, MD, Lindsey  HISTORY OF PRESENT ILLNESS: Craig Lindsey is a 51 y.o. (DOB: 03/08/1960)  white male whose primary care physician is Craig Abide, MD, Lindsey and comes to me today for follow up of a ventral incisional hernia.  He thinks that it may be a little larger than last year, but is not causing pain.  He still struggles with his weight.    Past Medical History  Diagnosis Date  . Diabetes mellitus     Type II  . Hyperlipidemia   . Hypertension   . Arrhythmia 12/04    1. Ventricular tachycardia; Dr. Ladona Lindsey  2. Atrial fibrillation  . Sleep apnea   . Obesity   . Hemorrhoids   . Personal history of colon cancer, stage III 1/09    Dr. Leone Lindsey, Craig Lindsey, Craig Lindsey  . Neuropathy     Oxaliplatin  . DVT (deep venous thrombosis) 7/09    RLE, Tx Arixtra  . Chronic kidney disease     Complex L renal cyst  . Sigmoid colon ulcer     Sigmoid; Craig Lindsey (colon cancer)    Current Outpatient Prescriptions  Medication Sig Dispense Refill   . amiodarone (PACERONE) 200 MG tablet Take 1 tablet (200 mg total) by mouth daily.  60 tablet  6  . diltiazem (DILACOR XR) 240 MG 24 hr capsule Take 240 mg by mouth daily.       . furosemide (LASIX) 40 MG tablet Take 1 tablet (40 mg total) by mouth daily.  60 tablet  6  . glipiZIDE (GLUCOTROL) 10 MG 24 hr tablet Take 1 tablet (10 mg total) by mouth daily.  60 tablet  6  . glucose blood (ONE TOUCH ULTRA TEST) test strip PRN  60 each  5  . insulin glargine (LANTUS) 100 UNIT/ML injection Inject 26 Units into the skin at bedtime.        . Insulin Pen Needle 29G X 12.7MM MISC Inject 20 Units into the muscle daily.        . Lancets (ONETOUCH ULTRASOFT) lancets PRN  60 each  5  . lisinopril (PRINIVIL,ZESTRIL) 20 MG tablet Take 1 tablet (20 mg total) by mouth daily.  90 tablet  3  . metFORMIN (GLUCOPHAGE) 1000 MG tablet Take 1 tablet (1,000 mg total) by mouth 2 (two) times daily.  120 tablet  6  .  metoprolol (TOPROL-XL) 100 MG 24 hr tablet Take one and one half by mouth daily  90 tablet  6  . multivitamin (THERAGRAN) per tablet Take 1 tablet by mouth daily.        . NON FORMULARY Knee high compression hose 20-30 mmhg. Apply daily and remove at bedtime.       Marland Kitchen spironolactone (ALDACTONE) 50 MG tablet Take 1 tablet (50 mg total) by mouth 2 (two) times daily.  120 tablet  6  . warfarin (COUMADIN) 4 MG tablet Use as directed   120 tablet  6     No Known Allergies  SOCIAL and FAMILY HISTORY: Married.  Out of work  PHYSICAL EXAM: BP 144/88  Pulse 76  Temp(Src) 97.7 F (36.5 C) (Temporal)  Resp 20  Ht 6\' 4"  (1.93 m)  Wt 433 lb (196.408 kg)  BMI 52.71 kg/m2  General: Obese WM who is alert and generally healthy appearing.  HEENT: Normal. Pupils equal. Good dentition. Neck: Supple. No mass.  No thyroid mass.  Carotid pulse okay with no bruit. Lymph Nodes:  No supraclavicular or cervical nodes. Lungs: Clear to auscultation and symmetric breath sounds. Heart:  RRR. No murmur or rub.  Abdomen:  Soft. Very large abdomen which limits the exam.  Has a bulge at upper part of incision to the left.  Reducible. Rectal: Not done. Extremities:  Good strength and ROM  in upper and lower extremities. Neurologic:  Grossly intact to motor and sensory function. Psychiatric: Has normal mood and affect. Behavior is normal.   DATA REVIEWED: None  Craig Kin, Lindsey,  [facility], [place] [address] [place]  [address]   [place], [place]    [address] Phone:  [phone] FAX:  [phone]

## 2011-03-30 ENCOUNTER — Ambulatory Visit: Payer: BC Managed Care – PPO

## 2011-04-05 ENCOUNTER — Telehealth: Payer: Self-pay | Admitting: Oncology

## 2011-04-05 NOTE — Telephone Encounter (Signed)
pt called and r/s appts for 3-21 to 4-11

## 2011-04-06 ENCOUNTER — Ambulatory Visit: Payer: BC Managed Care – PPO | Admitting: Nurse Practitioner

## 2011-04-06 ENCOUNTER — Ambulatory Visit: Payer: BC Managed Care – PPO | Admitting: Oncology

## 2011-04-06 ENCOUNTER — Other Ambulatory Visit: Payer: BC Managed Care – PPO | Admitting: Lab

## 2011-04-07 ENCOUNTER — Ambulatory Visit: Payer: BC Managed Care – PPO | Admitting: Internal Medicine

## 2011-04-13 ENCOUNTER — Ambulatory Visit (INDEPENDENT_AMBULATORY_CARE_PROVIDER_SITE_OTHER): Payer: BC Managed Care – PPO | Admitting: Internal Medicine

## 2011-04-13 DIAGNOSIS — I82409 Acute embolism and thrombosis of unspecified deep veins of unspecified lower extremity: Secondary | ICD-10-CM

## 2011-04-13 DIAGNOSIS — Z7901 Long term (current) use of anticoagulants: Secondary | ICD-10-CM

## 2011-04-13 DIAGNOSIS — Z5181 Encounter for therapeutic drug level monitoring: Secondary | ICD-10-CM

## 2011-04-13 LAB — POCT INR: INR: 2.6

## 2011-04-13 NOTE — Patient Instructions (Signed)
Continue current dose, check in 4 weeks  

## 2011-04-27 ENCOUNTER — Ambulatory Visit (HOSPITAL_BASED_OUTPATIENT_CLINIC_OR_DEPARTMENT_OTHER): Payer: BC Managed Care – PPO | Admitting: Nurse Practitioner

## 2011-04-27 ENCOUNTER — Other Ambulatory Visit (HOSPITAL_BASED_OUTPATIENT_CLINIC_OR_DEPARTMENT_OTHER): Payer: BC Managed Care – PPO

## 2011-04-27 ENCOUNTER — Telehealth: Payer: Self-pay | Admitting: Oncology

## 2011-04-27 VITALS — BP 137/56 | HR 80 | Temp 97.0°F | Ht 64.0 in | Wt >= 6400 oz

## 2011-04-27 DIAGNOSIS — K7689 Other specified diseases of liver: Secondary | ICD-10-CM

## 2011-04-27 DIAGNOSIS — Z85038 Personal history of other malignant neoplasm of large intestine: Secondary | ICD-10-CM

## 2011-04-27 DIAGNOSIS — Z7901 Long term (current) use of anticoagulants: Secondary | ICD-10-CM

## 2011-04-27 DIAGNOSIS — Z86718 Personal history of other venous thrombosis and embolism: Secondary | ICD-10-CM

## 2011-04-27 DIAGNOSIS — C189 Malignant neoplasm of colon, unspecified: Secondary | ICD-10-CM

## 2011-04-27 LAB — CEA: CEA: 1.5 ng/mL (ref 0.0–5.0)

## 2011-04-27 NOTE — Telephone Encounter (Signed)
Gv pt appt for [id number] °

## 2011-04-27 NOTE — Progress Notes (Signed)
OFFICE PROGRESS NOTE  Interval history:  Craig Lindsey returns as scheduled. He feels well. No change in bowel habits. No abdominal pain. No hematochezia or melena. He denies shortness of breath. No nausea or vomiting. He has a good appetite.   Objective: Blood pressure 137/56, pulse 80, temperature 97 F (36.1 C), temperature source Oral, height 5\' 4"  (1.626 m), weight 435 lb (197.315 kg).  Oropharynx is without thrush or ulceration. No palpable cervical, supraclavicular, axillary or inguinal lymph nodes. Lungs are clear. Regular cardiac rhythm. Heart sounds are distant. Abdomen is obese. No obvious hepatomegaly. He is wearing support stockings.  Lab Results: Lab Results  Component Value Date   WBC 8.3 09/13/2010   HGB 16.8 09/13/2010   HCT 49.8 09/13/2010   MCV 92.1 09/13/2010   PLT 167.0 09/13/2010    Chemistry:    Chemistry      Component Value Date/Time   NA 134* 09/29/2010 1006   K 4.2 09/29/2010 1006   CL 97 09/29/2010 1006   CO2 28 09/29/2010 1006   BUN 16 09/29/2010 1006   CREATININE 1.01 09/29/2010 1006      Component Value Date/Time   CALCIUM 9.3 09/29/2010 1006   ALKPHOS 62 12/14/2010 1152   AST 35 12/14/2010 1152   ALT 64* 12/14/2010 1152   BILITOT 0.5 12/14/2010 1152       Studies/Results: No results found.  Medications: I have reviewed the patient's current medications.  Assessment/Plan:  1. Stage III colon cancer diagnosed in January 2009 status post 8 cycles of adjuvant FOLFOX chemotherapy an 4 cycles of 5-FU/leucovorin.  The last chemotherapy was given 09/17/2007.  He remains in clinical remission.  A restaging CT evaluation on 09/29/2010 revealed no evidence of recurrent colon cancer. 2. History of oxaliplatin neuropathy. 3. Status post Port-A-Cath placement 03/21/2007.  The Port-A-Cath has been removed. 4. Microscopically detected peritoneal soft tissue metastatic focus at the time of the initial colon resection.  No evidence for recurrent colon cancer on the  CT in September 2012. 5. Indeterminate liver lesion and left iliac lymph node on a preoperative CT.  A followup CT 04/11/2007 found the liver lesion to be stable.  The indeterminate liver lesion was not seen on the CT 10/14/2007 and was felt to be a benign finding.  The iliac lymph node was borderline enlarged on the January 2009 CT, slightly smaller on the March 2009 CT, and unchanged on a September 2009 study.  This was felt to be a benign finding by the radiologist. 6. History of ventricular fibrillation status post AICD placement. 7. Atrial fibrillation.  Maintained on Coumadin. 8. Right lower extremity deep vein thrombosis 08/09/2007.  Treated with Arixtra.  He is now maintained on Coumadin. 9. Diabetes. 10. Sleep apnea. 11. History of a skin rash.  Resolved with Lamisil. 12. History of thrombocytopenia secondary to chemotherapy. 13. Complex left renal cyst increased in size on the CT 09/28/2008.  A repeat CT with and without contrast 10/14/2008 revealed no convincing enhancement in the dominant bilateral cystic renal lesions.  The lesions were present on scans dating to 2005 with a slow interval increase in size suggesting multiple cysts or indolent lesions.  The left kidney lower pole cyst was reduced in size on the 10/04/2009 CT. 14. Morbid obesity. 15. Colonoscopy 07/02/2009 with findings of a 7 mL sessile polyp in the descending colon and nodular mucosa at the anastomosis.  The pathology on the colon polyp showed an adenomatous polyp with no high-grade dysplasia or invasive malignancy identified.  Pathology on the sigmoid anastomosis showed benign colonic mucosa.  Disposition-Craig Lindsey appears stable. He remains in clinical remission from colon cancer. We will followup on the CEA from today. He will return for a followup visit and CEA in 6 months. He will contact the office in the interim with any problems.  Plan reviewed with Dr. Truett Perna.  Lonna Cobb ANP/GNP-BC

## 2011-05-03 ENCOUNTER — Ambulatory Visit (INDEPENDENT_AMBULATORY_CARE_PROVIDER_SITE_OTHER): Payer: BC Managed Care – PPO | Admitting: Internal Medicine

## 2011-05-03 ENCOUNTER — Encounter: Payer: Self-pay | Admitting: Internal Medicine

## 2011-05-03 VITALS — BP 148/80 | HR 80 | Temp 98.4°F | Ht 76.0 in | Wt >= 6400 oz

## 2011-05-03 DIAGNOSIS — E785 Hyperlipidemia, unspecified: Secondary | ICD-10-CM

## 2011-05-03 DIAGNOSIS — Z23 Encounter for immunization: Secondary | ICD-10-CM

## 2011-05-03 DIAGNOSIS — Z5181 Encounter for therapeutic drug level monitoring: Secondary | ICD-10-CM

## 2011-05-03 DIAGNOSIS — I10 Essential (primary) hypertension: Secondary | ICD-10-CM

## 2011-05-03 DIAGNOSIS — I82409 Acute embolism and thrombosis of unspecified deep veins of unspecified lower extremity: Secondary | ICD-10-CM

## 2011-05-03 DIAGNOSIS — E1149 Type 2 diabetes mellitus with other diabetic neurological complication: Secondary | ICD-10-CM

## 2011-05-03 DIAGNOSIS — Z7901 Long term (current) use of anticoagulants: Secondary | ICD-10-CM

## 2011-05-03 DIAGNOSIS — I4891 Unspecified atrial fibrillation: Secondary | ICD-10-CM

## 2011-05-03 DIAGNOSIS — E1142 Type 2 diabetes mellitus with diabetic polyneuropathy: Secondary | ICD-10-CM

## 2011-05-03 LAB — HEMOGLOBIN A1C: Hgb A1c MFr Bld: 7.9 % — ABNORMAL HIGH (ref 4.6–6.5)

## 2011-05-03 LAB — BASIC METABOLIC PANEL
BUN: 29 mg/dL — ABNORMAL HIGH (ref 6–23)
Calcium: 9 mg/dL (ref 8.4–10.5)
Chloride: 105 mEq/L (ref 96–112)
Creatinine, Ser: 1.4 mg/dL (ref 0.4–1.5)
GFR: 57.26 mL/min — ABNORMAL LOW (ref >60.00)

## 2011-05-03 LAB — POCT INR: INR: 2.6

## 2011-05-03 MED ORDER — ATORVASTATIN CALCIUM 10 MG PO TABS: 10.0000 mg | ORAL_TABLET | Freq: Every day | ORAL | Status: DC

## 2011-05-03 MED ORDER — LISINOPRIL 40 MG PO TABS: 40.0000 mg | ORAL_TABLET | Freq: Every day | ORAL | Status: DC

## 2011-05-03 NOTE — Patient Instructions (Signed)
Please set up appt for blood work in about 6 weeks Lipid, hepatic---272.4 Met B--- 401.9

## 2011-05-03 NOTE — Assessment & Plan Note (Signed)
BP Readings from Last 3 Encounters:  05/03/11 148/80  04/27/11 137/56  03/29/11 144/88   Not at goal Will increase lisinopril to 40mg  daily

## 2011-05-03 NOTE — Assessment & Plan Note (Signed)
Paced On coumadin 

## 2011-05-03 NOTE — Progress Notes (Signed)
Subjective:    Patient ID: Craig Lindsey, male    DOB: 1960/11/13, 51 y.o.   MRN: [id number]  HPI Doing okay Feels the diabetes is still under control Checks fasting sugars every other day 130-145 No hypoglycemic reactions since just starting the lantus  No problems on lisinopril occ gets lightheaded briefly and getting up quick No syncope No cough, throat issues or mouth swelling  Has started walking with wife Plans to start water aerobics  Heart fine No chest pain No SOB  Current Outpatient Prescriptions on File Prior to Visit  Medication Sig Dispense Refill  . amiodarone (PACERONE) 200 MG tablet Take 1 tablet (200 mg total) by mouth daily.  60 tablet  6  . diltiazem (DILACOR XR) 240 MG 24 hr capsule Take 240 mg by mouth daily.       . furosemide (LASIX) 40 MG tablet Take 1 tablet (40 mg total) by mouth daily.  60 tablet  6  . glipiZIDE (GLUCOTROL) 10 MG 24 hr tablet Take 1 tablet (10 mg total) by mouth daily.  60 tablet  6  . glucose blood (ONE TOUCH ULTRA TEST) test strip PRN  60 each  5  . insulin glargine (LANTUS) 100 UNIT/ML injection Inject 27 Units into the skin at bedtime.       . Insulin Pen Needle 29G X 12.7MM MISC Inject 20 Units into the muscle daily.        . Lancets (ONETOUCH ULTRASOFT) lancets PRN  60 each  5  . lisinopril (PRINIVIL,ZESTRIL) 20 MG tablet Take 1 tablet (20 mg total) by mouth daily.  90 tablet  3  . metFORMIN (GLUCOPHAGE) 1000 MG tablet Take 1 tablet (1,000 mg total) by mouth 2 (two) times daily.  120 tablet  6  . metoprolol (TOPROL-XL) 100 MG 24 hr tablet Take one and one half by mouth daily  90 tablet  6  . multivitamin (THERAGRAN) per tablet Take 1 tablet by mouth daily.        . NON FORMULARY Knee high compression hose 20-30 mmhg. Apply daily and remove at bedtime.       Marland Kitchen spironolactone (ALDACTONE) 50 MG tablet Take 1 tablet (50 mg total) by mouth 2 (two) times daily.  120 tablet  6  . warfarin (COUMADIN) 4 MG tablet Use as directed   120  tablet  6    No Known Allergies  Past Medical History  Diagnosis Date  . Diabetes mellitus     Type II  . Hyperlipidemia   . Hypertension   . Arrhythmia 12/04    1. Ventricular tachycardia; Dr. Ladona Ridgel  2. Atrial fibrillation  . Sleep apnea   . Obesity   . Hemorrhoids   . Personal history of colon cancer, stage III 1/09    Dr. Leone Payor, Truett Perna, D. Newman  . Neuropathy     Oxaliplatin  . DVT (deep venous thrombosis) 7/09    RLE, Tx Arixtra  . Chronic kidney disease     Complex L renal cyst  . Sigmoid colon ulcer     Sigmoid; Raelyn Mora, MD (colon cancer)    Past Surgical History  Procedure Date  . Cardiac defibrillator placement   . Colectomy 1/09    Sigmoid; Raelyn Mora, MD (colon cancer)  . Cholecystectomy   . Hernia repair     umb hernia    Family History  Problem Relation Age of Onset  . Diabetes Father   . Diabetes Maternal Grandmother   . Hypertension Neg  Hx   . Coronary artery disease Neg Hx   . Prostate cancer Neg Hx   . Colon cancer Other   . Cancer Maternal Aunt     colon    History   Social History  . Marital Status: Married    Spouse Name: N/A    Number of Children: 2  . Years of Education: N/A   Occupational History  . [employer]     Disabled   Social History Main Topics  . Smoking status: Never Smoker   . Smokeless tobacco: Never Used  . Alcohol Use: No  . Drug Use: No  . Sexually Active: Not on file   Other Topics Concern  . Not on file   Social History Narrative   2 step children from wife's 2nd marriage.   Review of Systems Not as careful with eating Has gained 15# back since the fall    Objective:   Physical Exam  Constitutional: He appears well-developed and well-nourished. No distress.  Neck: Normal range of motion. Neck supple.  Cardiovascular: Normal rate, regular rhythm and normal heart sounds.  Exam reveals no gallop.   No murmur heard.      Faint distal pulses  Pulmonary/Chest: Effort normal and breath  sounds normal. No respiratory distress. He has no wheezes. He has no rales.  Musculoskeletal:       Trace non pitting edema  Lymphadenopathy:    He has no cervical adenopathy.  Skin:       Venous stasis changes in distal calves  Psychiatric: He has a normal mood and affect. His behavior is normal.          Assessment & Plan:

## 2011-05-03 NOTE — Assessment & Plan Note (Signed)
Last LDL 124 Will try low dose atorvastatin

## 2011-05-03 NOTE — Patient Instructions (Signed)
Continue 8 mg daily, 6 mg Tues, Thurs, recheck 4 weeks

## 2011-05-03 NOTE — Progress Notes (Signed)
Addended by: Sueanne Margarita on: 05/03/2011 11:29 AM   Modules accepted: Orders

## 2011-05-03 NOTE — Assessment & Plan Note (Signed)
Lab Results  Component Value Date   HGBA1C 7.6* 12/14/2010   Better with insulin Now needs to work on lifestyle issues and weight loss Recheck A1c today

## 2011-05-08 ENCOUNTER — Encounter: Payer: Self-pay | Admitting: *Deleted

## 2011-05-29 ENCOUNTER — Ambulatory Visit: Payer: BC Managed Care – PPO

## 2011-06-12 ENCOUNTER — Encounter: Payer: Self-pay | Admitting: *Deleted

## 2011-06-13 ENCOUNTER — Ambulatory Visit (INDEPENDENT_AMBULATORY_CARE_PROVIDER_SITE_OTHER): Payer: BC Managed Care – PPO | Admitting: Internal Medicine

## 2011-06-13 DIAGNOSIS — Z7901 Long term (current) use of anticoagulants: Secondary | ICD-10-CM

## 2011-06-13 DIAGNOSIS — Z5181 Encounter for therapeutic drug level monitoring: Secondary | ICD-10-CM

## 2011-06-13 DIAGNOSIS — I82409 Acute embolism and thrombosis of unspecified deep veins of unspecified lower extremity: Secondary | ICD-10-CM

## 2011-06-13 LAB — POCT INR: INR: 4.4

## 2011-06-13 NOTE — Patient Instructions (Signed)
Hold x 1 day, then resume  8 mg daily, 6 mg Tues, Thurs, check Friday( eating less to lose weight, told him to eat more greens and may have to adjust dose)

## 2011-06-16 ENCOUNTER — Ambulatory Visit: Payer: BC Managed Care – PPO

## 2011-06-26 ENCOUNTER — Ambulatory Visit (INDEPENDENT_AMBULATORY_CARE_PROVIDER_SITE_OTHER): Payer: BC Managed Care – PPO | Admitting: Internal Medicine

## 2011-06-26 DIAGNOSIS — Z7901 Long term (current) use of anticoagulants: Secondary | ICD-10-CM

## 2011-06-26 DIAGNOSIS — Z5181 Encounter for therapeutic drug level monitoring: Secondary | ICD-10-CM

## 2011-06-26 DIAGNOSIS — I82409 Acute embolism and thrombosis of unspecified deep veins of unspecified lower extremity: Secondary | ICD-10-CM

## 2011-06-26 LAB — POCT INR: INR: 3.4

## 2011-06-26 NOTE — Patient Instructions (Signed)
8 mg daily, 6 mg Tues, Thurs, Sat

## 2011-07-10 ENCOUNTER — Ambulatory Visit: Payer: BC Managed Care – PPO

## 2011-07-31 ENCOUNTER — Ambulatory Visit (INDEPENDENT_AMBULATORY_CARE_PROVIDER_SITE_OTHER): Payer: BC Managed Care – PPO | Admitting: Internal Medicine

## 2011-07-31 DIAGNOSIS — Z5181 Encounter for therapeutic drug level monitoring: Secondary | ICD-10-CM

## 2011-07-31 DIAGNOSIS — Z7901 Long term (current) use of anticoagulants: Secondary | ICD-10-CM

## 2011-07-31 DIAGNOSIS — I82409 Acute embolism and thrombosis of unspecified deep veins of unspecified lower extremity: Secondary | ICD-10-CM

## 2011-07-31 LAB — POCT INR: INR: 3

## 2011-07-31 NOTE — Patient Instructions (Signed)
Continue current dose, check in 4 weeks  

## 2011-08-16 ENCOUNTER — Other Ambulatory Visit: Payer: Self-pay | Admitting: Cardiovascular Disease

## 2011-08-16 ENCOUNTER — Other Ambulatory Visit: Payer: Self-pay | Admitting: *Deleted

## 2011-08-16 DIAGNOSIS — I10 Essential (primary) hypertension: Secondary | ICD-10-CM

## 2011-08-16 MED ORDER — WARFARIN SODIUM 4 MG PO TABS: ORAL_TABLET | ORAL | Status: DC

## 2011-08-16 MED ORDER — AMIODARONE HCL 200 MG PO TABS: 200.0000 mg | ORAL_TABLET | Freq: Every day | ORAL | Status: DC

## 2011-08-16 MED ORDER — DILTIAZEM HCL ER 240 MG PO CP24: 240.0000 mg | ORAL_CAPSULE | Freq: Every day | ORAL | Status: DC

## 2011-08-16 MED ORDER — SPIRONOLACTONE 50 MG PO TABS: 50.0000 mg | ORAL_TABLET | Freq: Two times a day (BID) | ORAL | Status: DC

## 2011-08-16 MED ORDER — GLIPIZIDE ER 10 MG PO TB24: 10.0000 mg | ORAL_TABLET | Freq: Every day | ORAL | Status: DC

## 2011-08-16 MED ORDER — METOPROLOL SUCCINATE ER 100 MG PO TB24: ORAL_TABLET | ORAL | Status: DC

## 2011-08-16 MED ORDER — METFORMIN HCL 1000 MG PO TABS: 1000.0000 mg | ORAL_TABLET | Freq: Two times a day (BID) | ORAL | Status: DC

## 2011-08-16 NOTE — Telephone Encounter (Signed)
Rx called in to pharmacy. 

## 2011-08-16 NOTE — Telephone Encounter (Signed)
He should really get this refilled by the cardiologist---in case they want to adjust dosage, etc

## 2011-08-16 NOTE — Telephone Encounter (Signed)
Advised pharmacist. 

## 2011-08-16 NOTE — Telephone Encounter (Signed)
Refilled Amiodarone. 

## 2011-08-28 ENCOUNTER — Ambulatory Visit: Payer: BC Managed Care – PPO

## 2011-09-12 ENCOUNTER — Encounter: Payer: Self-pay | Admitting: *Deleted

## 2011-09-13 ENCOUNTER — Other Ambulatory Visit: Payer: Self-pay | Admitting: *Deleted

## 2011-09-13 MED ORDER — FUROSEMIDE 40 MG PO TABS: 40.0000 mg | ORAL_TABLET | Freq: Every day | ORAL | Status: DC

## 2011-09-21 ENCOUNTER — Ambulatory Visit (INDEPENDENT_AMBULATORY_CARE_PROVIDER_SITE_OTHER): Payer: BC Managed Care – PPO | Admitting: Internal Medicine

## 2011-09-21 DIAGNOSIS — Z7901 Long term (current) use of anticoagulants: Secondary | ICD-10-CM

## 2011-09-21 DIAGNOSIS — I82409 Acute embolism and thrombosis of unspecified deep veins of unspecified lower extremity: Secondary | ICD-10-CM

## 2011-09-21 DIAGNOSIS — Z5181 Encounter for therapeutic drug level monitoring: Secondary | ICD-10-CM

## 2011-09-21 NOTE — Patient Instructions (Addendum)
Continue 8 mg daily, 6 mg Tues, Thurs, Sat, recheck 5 weeks

## 2011-10-19 ENCOUNTER — Telehealth: Payer: Self-pay | Admitting: Oncology

## 2011-10-19 NOTE — Telephone Encounter (Signed)
Pt called and left message, called pt back and r/s appt with ML and lab from 10/26/11 to 11/29/11 lab and ML, nurse notified

## 2011-10-25 ENCOUNTER — Other Ambulatory Visit: Payer: Self-pay | Admitting: *Deleted

## 2011-10-25 MED ORDER — INSULIN GLARGINE 100 UNIT/ML ~~LOC~~ SOLN: 27.0000 [IU] | Freq: Every day | SUBCUTANEOUS | Status: DC

## 2011-10-26 ENCOUNTER — Other Ambulatory Visit: Payer: BC Managed Care – PPO | Admitting: Lab

## 2011-10-26 ENCOUNTER — Ambulatory Visit: Payer: BC Managed Care – PPO

## 2011-10-26 ENCOUNTER — Ambulatory Visit: Payer: BC Managed Care – PPO | Admitting: Nurse Practitioner

## 2011-11-03 ENCOUNTER — Telehealth: Payer: Self-pay | Admitting: Radiology

## 2011-11-03 ENCOUNTER — Ambulatory Visit (INDEPENDENT_AMBULATORY_CARE_PROVIDER_SITE_OTHER): Payer: BC Managed Care – PPO | Admitting: Internal Medicine

## 2011-11-03 DIAGNOSIS — I82409 Acute embolism and thrombosis of unspecified deep veins of unspecified lower extremity: Secondary | ICD-10-CM

## 2011-11-03 DIAGNOSIS — Z5181 Encounter for therapeutic drug level monitoring: Secondary | ICD-10-CM

## 2011-11-03 DIAGNOSIS — Z7901 Long term (current) use of anticoagulants: Secondary | ICD-10-CM

## 2011-11-03 NOTE — Telephone Encounter (Signed)
While patient was here for INR today, he stated his left knee is hurting and is having trouble going up and down steps. He wanted to know if he should schedule with you or get a referral to see ortho. Please advise

## 2011-11-03 NOTE — Telephone Encounter (Signed)
I spoke with patient and he scheduled appointment with Dr.Copland on 11/13/11.

## 2011-11-03 NOTE — Telephone Encounter (Signed)
Probably best to start here Okay to schedule with me or might be better to get in with Dr Copland---can explain he is a sports medicine expert

## 2011-11-03 NOTE — Patient Instructions (Signed)
Hold today's dose, the 6 mg daily, 8 mg Tue/ Thurs check in 1 week

## 2011-11-07 ENCOUNTER — Encounter: Payer: BC Managed Care – PPO | Admitting: Internal Medicine

## 2011-11-10 ENCOUNTER — Ambulatory Visit: Payer: BC Managed Care – PPO

## 2011-11-13 ENCOUNTER — Ambulatory Visit (INDEPENDENT_AMBULATORY_CARE_PROVIDER_SITE_OTHER)
Admission: RE | Admit: 2011-11-13 | Discharge: 2011-11-13 | Disposition: A | Discharge status: Home / Self Care | Payer: BC Managed Care – PPO | Source: Ambulatory Visit | Attending: Family Medicine | Admitting: Family Medicine

## 2011-11-13 ENCOUNTER — Ambulatory Visit (INDEPENDENT_AMBULATORY_CARE_PROVIDER_SITE_OTHER): Payer: BC Managed Care – PPO | Admitting: Internal Medicine

## 2011-11-13 ENCOUNTER — Ambulatory Visit (INDEPENDENT_AMBULATORY_CARE_PROVIDER_SITE_OTHER): Payer: BC Managed Care – PPO | Admitting: Family Medicine

## 2011-11-13 ENCOUNTER — Encounter: Payer: Self-pay | Admitting: Family Medicine

## 2011-11-13 VITALS — BP 120/70 | HR 80 | Temp 98.2°F | Wt >= 6400 oz

## 2011-11-13 DIAGNOSIS — M1710 Unilateral primary osteoarthritis, unspecified knee: Secondary | ICD-10-CM

## 2011-11-13 DIAGNOSIS — M25569 Pain in unspecified knee: Secondary | ICD-10-CM

## 2011-11-13 DIAGNOSIS — M1712 Unilateral primary osteoarthritis, left knee: Secondary | ICD-10-CM

## 2011-11-13 DIAGNOSIS — I82409 Acute embolism and thrombosis of unspecified deep veins of unspecified lower extremity: Secondary | ICD-10-CM

## 2011-11-13 DIAGNOSIS — M25562 Pain in left knee: Secondary | ICD-10-CM

## 2011-11-13 DIAGNOSIS — Z7901 Long term (current) use of anticoagulants: Secondary | ICD-10-CM

## 2011-11-13 DIAGNOSIS — Z5181 Encounter for therapeutic drug level monitoring: Secondary | ICD-10-CM

## 2011-11-13 NOTE — Patient Instructions (Addendum)
6 mg daily, 8 mg  Thurs check in 2 week (reduced dose another 2 mg)

## 2011-11-13 NOTE — Progress Notes (Signed)
Nature conservation officer at [facility] [address] [place] [address] Phone: [phone] Fax: [phone]  Date:  11/13/2011   Name:  Craig Lindsey   DOB:  [birth year]   MRN:  [id number] Gender: male Age: 51 y.o.  PCP:  Tillman Abide, MD  Evaluating MD: Hannah Beat, MD   Chief Complaint: Knee Pain   History of Present Illness:  Craig Lindsey is a 51 y.o. pleasant patient who presents with the following:  L PF OA:  Patient presents with 3 week h/o L sided knee pain after no injury. No audible pop was heard. The patient has not had an effusion. No symptomatic giving-way. No mechanical clicking. Joint has not locked up. Patient has been able to walk but is limping. The patient does have pain going up and down stairs or rising from a seated position.   Pain location: anterior mostly Current physical activity: none Prior Knee Surgery: none Current pain meds: none Bracing: none  Patient Active Problem List  Diagnosis  . HYPERLIPIDEMIA  . OBESITY  . PERIPHERAL NEUROPATHY, FEET  . HYPERTENSION  . VENTRICULAR TACHYCARDIA  . ATRIAL FIBRILLATION  . DEEP VENOUS THROMBOPHLEBITIS  . HYPERSOMNIA, ASSOCIATED WITH SLEEP APNEA  . URINARY URGENCY  . ADENOCARCINOMA, COLON, HX OF, T3, N1, Sigmoid colectomy 02/13/2007.  . AUTOMATIC IMPLANTABLE CARDIAC DEFIBRILLATOR SITU  . DIABETES MELLITUS, TYPE II, WITH NEUROLOGICAL COMPLICATIONS  . OSTEOARTHRITIS, KNEES, BILATERAL  . Routine general medical examination at a health care facility  . Ventral hernia    Past Medical History  Diagnosis Date  . Diabetes mellitus     Type II  . Hyperlipidemia   . Hypertension   . Arrhythmia 12/04    1. Ventricular tachycardia; Dr. Ladona Ridgel  2. Atrial fibrillation  . Sleep apnea   . Obesity   . Hemorrhoids   . Personal history of colon cancer, stage III 1/09    Dr. Leone Payor, Truett Perna, D. Newman  . Neuropathy     Oxaliplatin  . DVT (deep venous thrombosis) 7/09    RLE, Tx Arixtra  .  Chronic kidney disease     Complex L renal cyst  . Sigmoid colon ulcer     Sigmoid; Raelyn Mora, MD (colon cancer)    Past Surgical History  Procedure Date  . Cardiac defibrillator placement   . Colectomy 1/09    Sigmoid; Raelyn Mora, MD (colon cancer)  . Cholecystectomy   . Hernia repair     umb hernia    History  Substance Use Topics  . Smoking status: Never Smoker   . Smokeless tobacco: Never Used  . Alcohol Use: No    Family History  Problem Relation Age of Onset  . Diabetes Father   . Diabetes Maternal Grandmother   . Hypertension Neg Hx   . Coronary artery disease Neg Hx   . Prostate cancer Neg Hx   . Colon cancer Other   . Cancer Maternal Aunt     colon    No Known Allergies  Medication list has been reviewed and updated.  Outpatient Prescriptions Prior to Visit  Medication Sig Dispense Refill  . amiodarone (PACERONE) 200 MG tablet Take 1 tablet (200 mg total) by mouth daily.  60 tablet  6  . atorvastatin (LIPITOR) 10 MG tablet Take 1 tablet (10 mg total) by mouth daily.  90 tablet  3  . diltiazem (DILACOR XR) 240 MG 24 hr capsule Take 1 capsule (240 mg total) by mouth daily.  60 capsule  5  . furosemide (LASIX) 40 MG tablet Take 1 tablet (40 mg total) by mouth daily.  30 tablet  1  . glipiZIDE (GLUCOTROL XL) 10 MG 24 hr tablet Take 1 tablet (10 mg total) by mouth daily.  60 tablet  5  . insulin glargine (LANTUS SOLOSTAR) 100 UNIT/ML injection Inject 27 Units into the skin at bedtime.  5 pen  3  . Insulin Pen Needle 29G X 12.7MM MISC Inject 20 Units into the muscle daily.        Marland Kitchen lisinopril (PRINIVIL,ZESTRIL) 40 MG tablet Take 1 tablet (40 mg total) by mouth daily.  90 tablet  3  . metFORMIN (GLUCOPHAGE) 1000 MG tablet Take 1 tablet (1,000 mg total) by mouth 2 (two) times daily.  120 tablet  5  . metoprolol succinate (TOPROL-XL) 100 MG 24 hr tablet Take one and one half by mouth daily  90 tablet  5  . multivitamin (THERAGRAN) per tablet Take 1 tablet by mouth  daily.        . NON FORMULARY Knee high compression hose 20-30 mmhg. Apply daily and remove at bedtime.       Marland Kitchen spironolactone (ALDACTONE) 50 MG tablet Take 1 tablet (50 mg total) by mouth 2 (two) times daily.  120 tablet  5  . warfarin (COUMADIN) 4 MG tablet Use as directed  120 tablet  5    Review of Systems:   GEN: No fevers, chills. Nontoxic. Primarily MSK c/o today. MSK: Detailed in the HPI GI: tolerating PO intake without difficulty Neuro: No numbness, parasthesias, or tingling associated. Otherwise the pertinent positives of the ROS are noted above.    Physical Examination: Filed Vitals:   11/13/11 1147  BP: 120/70  Pulse: 80  Temp: 98.2 F (36.8 C)  TempSrc: Oral  Weight: 450 lb (204.119 kg)    There is no height on file to calculate BMI. Ideal Body Weight:     GEN: WDWN, NAD, Non-toxic, Alert & Oriented x 3 HEENT: Atraumatic, Normocephalic.  Ears and Nose: No external deformity. EXTR: No clubbing/cyanosis/edema NEURO: Normal gait.  PSYCH: Normally interactive. Conversant. Not depressed or anxious appearing.  Calm demeanor.   Knee:  L Gait: Normal heel toe pattern ROM: 0-125 Effusion: neg Echymosis or edema: none Patellar tendon NT Painful PLICA: neg Patellar grind: pos Medial and lateral patellar facet loading: negative medial and lateral joint lines:NT Mcmurray's neg Flexion-pinch neg Varus and valgus stress: stable Lachman: neg Ant and Post drawer: neg Hip abduction, IR, ER: WNL Hip flexion str: 5/5 Hip abd: 5/5 Quad: 5/5 VMO atrophy:No Hamstring concentric and eccentric: 5/5   Assessment and Plan:  1. Left knee pain  DG Knee AP/LAT W/Sunrise Left  2. Osteoarthritis of left knee     Mild OA, mildly flared, asx unless going up and down stairs.  Activity mod, ice, tylenol prn. Reassured.  Dg Knee Ap/lat W/sunrise Left  11/13/2011  *RADIOLOGY REPORT*  Clinical Data: Left knee pain  DG KNEE - 3 VIEWS  Comparison: None.  Findings: Mild  tricompartmental degenerative changes, most prominent in the lateral and patellofemoral compartments.  No fracture or dislocation is seen.  The visualized soft tissues are unremarkable.  No definite suprapatellar knee joint effusion.  IMPRESSION: Mild tricompartmental degenerative changes.   Original Report Authenticated By: Charline Bills, M.D.     Orders Today:  Orders Placed This Encounter  Procedures  . DG Knee AP/LAT W/Sunrise Left    Standing Status: Future     Number of  Occurrences: 1     Standing Expiration Date: 01/12/2013    Order Specific Question:  Reason for exam:    Answer:  left knee pain    Order Specific Question:  Preferred imaging location?    Answer:  [facility]    Updated Medication List: (Includes new medications, updates to list, dose adjustments) No orders of the defined types were placed in this encounter.    Medications Discontinued: There are no discontinued medications.   Hannah Beat, MD

## 2011-11-17 ENCOUNTER — Other Ambulatory Visit: Payer: Self-pay | Admitting: *Deleted

## 2011-11-17 MED ORDER — FUROSEMIDE 40 MG PO TABS: 40.0000 mg | ORAL_TABLET | Freq: Every day | ORAL | Status: DC

## 2011-11-27 ENCOUNTER — Ambulatory Visit: Payer: BC Managed Care – PPO

## 2011-11-29 ENCOUNTER — Ambulatory Visit (HOSPITAL_BASED_OUTPATIENT_CLINIC_OR_DEPARTMENT_OTHER): Payer: BC Managed Care – PPO | Admitting: Nurse Practitioner

## 2011-11-29 ENCOUNTER — Other Ambulatory Visit: Payer: BC Managed Care – PPO | Admitting: Lab

## 2011-11-29 ENCOUNTER — Telehealth: Payer: Self-pay | Admitting: Oncology

## 2011-11-29 VITALS — BP 148/70 | HR 85 | Temp 97.6°F | Resp 20 | Ht 76.0 in | Wt >= 6400 oz

## 2011-11-29 DIAGNOSIS — Z85038 Personal history of other malignant neoplasm of large intestine: Secondary | ICD-10-CM

## 2011-11-29 DIAGNOSIS — Z7901 Long term (current) use of anticoagulants: Secondary | ICD-10-CM

## 2011-11-29 DIAGNOSIS — I4891 Unspecified atrial fibrillation: Secondary | ICD-10-CM

## 2011-11-29 DIAGNOSIS — I82409 Acute embolism and thrombosis of unspecified deep veins of unspecified lower extremity: Secondary | ICD-10-CM

## 2011-11-29 NOTE — Telephone Encounter (Signed)
appts made and printed for pt aom °

## 2011-11-29 NOTE — Progress Notes (Signed)
OFFICE PROGRESS NOTE  Interval history:  Craig Lindsey returns as scheduled. He overall feels well. Bowels moving regularly. No hematochezia or melena. No abdominal pain. No nausea or vomiting. He continues to have a good appetite. He denies shortness of breath.   Objective: Blood pressure 148/70, pulse 85, temperature 97.6 F (36.4 C), temperature source Oral, resp. rate 20, height 6\' 4"  (1.93 m), weight 448 lb (203.211 kg).  Oropharynx is without thrush or ulceration. No palpable cervical, subclavicular, external lymph nodes. Lungs are clear. Regular cardiac rhythm. Abdomen is obese. Minimal lower leg edema bilaterally. He is wearing support stockings.  Lab Results: Lab Results  Component Value Date   WBC 8.3 09/13/2010   HGB 16.8 09/13/2010   HCT 49.8 09/13/2010   MCV 92.1 09/13/2010   PLT 167.0 09/13/2010    Chemistry:    Chemistry      Component Value Date/Time   NA 138 05/03/2011 1147   K 4.5 05/03/2011 1147   CL 105 05/03/2011 1147   CO2 23 05/03/2011 1147   BUN 29* 05/03/2011 1147   CREATININE 1.4 05/03/2011 1147      Component Value Date/Time   CALCIUM 9.0 05/03/2011 1147   ALKPHOS 62 12/14/2010 1152   AST 35 12/14/2010 1152   ALT 64* 12/14/2010 1152   BILITOT 0.5 12/14/2010 1152     04/27/2011 CEA 1.5.  Studies/Results: Dg Knee Ap/lat W/sunrise Left  11/13/2011  *RADIOLOGY REPORT*  Clinical Data: Left knee pain  DG KNEE - 3 VIEWS  Comparison: None.  Findings: Mild tricompartmental degenerative changes, most prominent in the lateral and patellofemoral compartments.  No fracture or dislocation is seen.  The visualized soft tissues are unremarkable.  No definite suprapatellar knee joint effusion.  IMPRESSION: Mild tricompartmental degenerative changes.   Original Report Authenticated By: Charline Bills, M.D.     Medications: I have reviewed the patient's current medications.  Assessment/Plan:  1. Stage III colon cancer diagnosed in January 2009 status post 8 cycles of  adjuvant FOLFOX chemotherapy an 4 cycles of 5-FU/leucovorin. The last chemotherapy was given 09/17/2007. He remains in clinical remission. A restaging CT evaluation on 09/29/2010 revealed no evidence of recurrent colon cancer. 2. History of oxaliplatin neuropathy. 3. Status post Port-A-Cath placement 03/21/2007. The Port-A-Cath has been removed. 4. Microscopically detected peritoneal soft tissue metastatic focus at the time of the initial colon resection. No evidence for recurrent colon cancer on the CT in September 2012. 5. Indeterminate liver lesion and left iliac lymph node on a preoperative CT. A followup CT 04/11/2007 found the liver lesion to be stable. The indeterminate liver lesion was not seen on the CT 10/14/2007 and was felt to be a benign finding. The iliac lymph node was borderline enlarged on the January 2009 CT, slightly smaller on the March 2009 CT, and unchanged on a September 2009 study. This was felt to be a benign finding by the radiologist. 6. History of ventricular fibrillation status post AICD placement. 7. Atrial fibrillation. Maintained on Coumadin. 8. Right lower extremity deep vein thrombosis 08/09/2007. Treated with Arixtra. He is now maintained on Coumadin. 9. Diabetes. 10. Sleep apnea. 11. History of a skin rash. Resolved with Lamisil. 12. History of thrombocytopenia secondary to chemotherapy. 13. Complex left renal cyst increased in size on the CT 09/28/2008. A repeat CT with and without contrast 10/14/2008 revealed no convincing enhancement in the dominant bilateral cystic renal lesions. The lesions were present on scans dating to 2005 with a slow interval increase in size suggesting multiple  cysts or indolent lesions. The left kidney lower pole cyst was reduced in size on the 10/04/2009 CT. 14. Morbid obesity. 15. Colonoscopy 07/02/2009 with findings of a 7 mL sessile polyp in the descending colon and nodular mucosa at the anastomosis. The pathology on the colon polyp  showed an adenomatous polyp with no high-grade dysplasia or invasive malignancy identified. Pathology on the sigmoid anastomosis showed benign colonic mucosa.  Disposition-Mr. Privitera remains in clinical remission from colon cancer. We will followup on the CEA from today. We plan to repeat a CEA at a six-month interval. He will return for a followup visit and CEA in one year. He will contact the office in the interim with any problems.  Plan reviewed with Dr. Truett Perna.  Craig Lindsey ANP/GNP-BC

## 2011-12-01 ENCOUNTER — Telehealth: Payer: Self-pay | Admitting: *Deleted

## 2011-12-01 NOTE — Telephone Encounter (Signed)
Message copied by Wandalee Ferdinand on Fri Dec 01, 2011 11:09 AM ------      Message from: Thornton Papas B      Created: Thu Nov 30, 2011  8:55 PM       Please call patient, cea is normal, f/u as scheduled

## 2011-12-01 NOTE — Telephone Encounter (Signed)
Left VM that CEA is normal 

## 2011-12-12 ENCOUNTER — Encounter: Payer: BC Managed Care – PPO | Admitting: Internal Medicine

## 2011-12-27 ENCOUNTER — Encounter: Payer: Self-pay | Admitting: *Deleted

## 2012-01-02 ENCOUNTER — Encounter: Payer: Self-pay | Admitting: Internal Medicine

## 2012-01-02 ENCOUNTER — Ambulatory Visit (INDEPENDENT_AMBULATORY_CARE_PROVIDER_SITE_OTHER): Payer: BC Managed Care – PPO | Admitting: Internal Medicine

## 2012-01-02 VITALS — BP 160/70 | HR 100 | Ht 76.0 in | Wt >= 6400 oz

## 2012-01-02 DIAGNOSIS — Z95810 Presence of automatic (implantable) cardiac defibrillator: Secondary | ICD-10-CM

## 2012-01-02 DIAGNOSIS — I10 Essential (primary) hypertension: Secondary | ICD-10-CM

## 2012-01-02 DIAGNOSIS — I472 Ventricular tachycardia: Secondary | ICD-10-CM

## 2012-01-02 DIAGNOSIS — I4891 Unspecified atrial fibrillation: Secondary | ICD-10-CM

## 2012-01-02 LAB — ICD DEVICE OBSERVATION
BRDY-0002RV: 50 {beats}/min
DEV-0020ICD: NEGATIVE
TZAT-0001SLOWVT: 1
TZAT-0001SLOWVT: 2
TZAT-0002FASTVT: NEGATIVE
TZAT-0005SLOWVT: 88 pct
TZAT-0018FASTVT: NEGATIVE
TZAT-0018SLOWVT: NEGATIVE
TZAT-0018SLOWVT: NEGATIVE
TZAT-0019FASTVT: 8 V
TZAT-0019SLOWVT: 8 V
TZAT-0019SLOWVT: 8 V
TZAT-0020SLOWVT: 1.5 ms
TZAT-0020SLOWVT: 1.5 ms
TZON-0004VSLOWVT: 20
TZON-0005SLOWVT: 12
TZST-0001FASTVT: 3
TZST-0001FASTVT: 5
TZST-0001SLOWVT: 3
TZST-0002FASTVT: NEGATIVE
TZST-0002FASTVT: NEGATIVE
TZST-0003SLOWVT: 35 J

## 2012-01-02 NOTE — Assessment & Plan Note (Signed)
He has had no sustained recurrent ventricular arrhythmias. He will undergo watchful waiting.

## 2012-01-02 NOTE — Assessment & Plan Note (Signed)
His blood pressure today is elevated. He admits to dietary indiscretion. He denies medical noncompliance. I've encouraged the patient to continue his current medical therapy, lose weight, and reduce his salt intake.

## 2012-01-02 NOTE — Assessment & Plan Note (Signed)
His [employer] [phone] defibrillator lead is working normally. His device is working normally. In the interim, he has had no symptomatic ventricular arrhythmias. We'll plan to recheck his device in several months.

## 2012-01-02 NOTE — Progress Notes (Signed)
HPI Craig Lindsey returns today for followup. He is a 51 year old man with morbid obesity, paroxysmal atrial fibrillation, ventricular fibrillation, status post ICD implantation. The patient has a history of colon cancer, status post resection and chemotherapy. He has been cancer free for many years. He does not have much in the way of heart failure symptoms, class II diastolic heart failure at the most. He has minimal peripheral edema. No syncope or recent ICD shock. No Known Allergies   Current Outpatient Prescriptions  Medication Sig Dispense Refill  . amiodarone (PACERONE) 200 MG tablet Take 1 tablet (200 mg total) by mouth daily.  60 tablet  6  . atorvastatin (LIPITOR) 10 MG tablet Take 1 tablet (10 mg total) by mouth daily.  90 tablet  3  . diltiazem (DILACOR XR) 240 MG 24 hr capsule Take 1 capsule (240 mg total) by mouth daily.  60 capsule  5  . furosemide (LASIX) 40 MG tablet Take 1 tablet (40 mg total) by mouth daily.  30 tablet  1  . glipiZIDE (GLUCOTROL XL) 10 MG 24 hr tablet Take 1 tablet (10 mg total) by mouth daily.  60 tablet  5  . insulin glargine (LANTUS SOLOSTAR) 100 UNIT/ML injection Inject 27 Units into the skin at bedtime.  5 pen  3  . Insulin Pen Needle 29G X 12.7MM MISC Inject 20 Units into the muscle daily.        Marland Kitchen lisinopril (PRINIVIL,ZESTRIL) 40 MG tablet Take 1 tablet (40 mg total) by mouth daily.  90 tablet  3  . metFORMIN (GLUCOPHAGE) 1000 MG tablet Take 1 tablet (1,000 mg total) by mouth 2 (two) times daily.  120 tablet  5  . metoprolol succinate (TOPROL-XL) 100 MG 24 hr tablet Take one and one half by mouth daily  90 tablet  5  . multivitamin (THERAGRAN) per tablet Take 1 tablet by mouth daily.        . NON FORMULARY Knee high compression hose 20-30 mmhg. Apply daily and remove at bedtime.       Marland Kitchen spironolactone (ALDACTONE) 50 MG tablet Take 1 tablet (50 mg total) by mouth 2 (two) times daily.  120 tablet  5  . warfarin (COUMADIN) 4 MG tablet Use as directed  120  tablet  5     Past Medical History  Diagnosis Date  . Diabetes mellitus     Type II  . Hyperlipidemia   . Hypertension   . Arrhythmia 12/04    1. Ventricular tachycardia; Dr. Ladona Ridgel  2. Atrial fibrillation  . Sleep apnea   . Obesity   . Hemorrhoids   . Personal history of colon cancer, stage III 1/09    Dr. Leone Payor, Truett Perna, D. Newman  . Neuropathy     Oxaliplatin  . DVT (deep venous thrombosis) 7/09    RLE, Tx Arixtra  . Chronic kidney disease     Complex L renal cyst  . Sigmoid colon ulcer     Sigmoid; Raelyn Mora, MD (colon cancer)    ROS:   All systems reviewed and negative except as noted in the HPI.   Past Surgical History  Procedure Date  . Cardiac defibrillator placement   . Colectomy 1/09    Sigmoid; Raelyn Mora, MD (colon cancer)  . Cholecystectomy   . Hernia repair     umb hernia     Family History  Problem Relation Age of Onset  . Diabetes Father   . Diabetes Maternal Grandmother   . Hypertension Neg  Hx   . Coronary artery disease Neg Hx   . Prostate cancer Neg Hx   . Colon cancer Other   . Cancer Maternal Aunt     colon     History   Social History  . Marital Status: Married    Spouse Name: N/A    Number of Children: 2  . Years of Education: N/A   Occupational History  . [employer]     Disabled   Social History Main Topics  . Smoking status: Never Smoker   . Smokeless tobacco: Never Used  . Alcohol Use: No  . Drug Use: No  . Sexually Active: Not on file   Other Topics Concern  . Not on file   Social History Narrative   2 step children from wife's 2nd marriage.     BP 160/70  Pulse 100  Ht 6\' 4"  (1.93 m)  Wt 449 lb 8 oz (203.892 kg)  BMI 54.71 kg/m2  Physical Exam:  Well appearing morbidly obese, middle-aged man, NAD HEENT: Unremarkable Neck:   7 cm JVD, no thyromegally Lungs:  Clear with no wheezes, rales, or rhonchi. HEART:  Regular rate rhythm, no murmurs, no rubs, no clicks, distant heart sounds. Abd:   soft, obese, positive bowel sounds, no organomegally, no rebound, no guarding Ext:  2 plus pulses, no edema, no cyanosis, no clubbing Skin:  No rashes no nodules Neuro:  CN II through XII intact, motor grossly intact  DEVICE  Normal device function.  See PaceArt for details.   Assess/Plan:

## 2012-01-02 NOTE — Patient Instructions (Addendum)
Your physician wants you to follow-up in: 1 year with Dr. Ladona Ridgel. [facility] transmission 04/08/12. You will receive a reminder letter in the mail two months in advance. If you don't receive a letter, please call our office to schedule the follow-up appointment.

## 2012-01-02 NOTE — Assessment & Plan Note (Signed)
He is maintaining sinus rhythm on low-dose amiodarone. He will continue his current medical therapy.

## 2012-01-15 ENCOUNTER — Telehealth: Payer: Self-pay | Admitting: Family Medicine

## 2012-01-15 NOTE — Telephone Encounter (Signed)
Pt requesting order for CPAP mask be faxed to [facility] at [phone] attn: Shine.  Previous order has expired.

## 2012-01-16 NOTE — Telephone Encounter (Signed)
Rx written. Please fax.

## 2012-01-16 NOTE — Telephone Encounter (Signed)
Order faxed.

## 2012-02-09 ENCOUNTER — Other Ambulatory Visit: Payer: Self-pay | Admitting: Internal Medicine

## 2012-02-16 ENCOUNTER — Encounter: Payer: Self-pay | Admitting: Internal Medicine

## 2012-02-16 ENCOUNTER — Ambulatory Visit (INDEPENDENT_AMBULATORY_CARE_PROVIDER_SITE_OTHER): Payer: BC Managed Care – PPO | Admitting: Internal Medicine

## 2012-02-16 VITALS — BP 132/64 | HR 87 | Temp 98.3°F | Ht 76.0 in | Wt >= 6400 oz

## 2012-02-16 DIAGNOSIS — I4891 Unspecified atrial fibrillation: Secondary | ICD-10-CM

## 2012-02-16 DIAGNOSIS — I10 Essential (primary) hypertension: Secondary | ICD-10-CM

## 2012-02-16 DIAGNOSIS — E1149 Type 2 diabetes mellitus with other diabetic neurological complication: Secondary | ICD-10-CM

## 2012-02-16 DIAGNOSIS — Z0000 Encounter for general adult medical examination without abnormal findings: Secondary | ICD-10-CM

## 2012-02-16 DIAGNOSIS — Z125 Encounter for screening for malignant neoplasm of prostate: Secondary | ICD-10-CM

## 2012-02-16 DIAGNOSIS — Z23 Encounter for immunization: Secondary | ICD-10-CM

## 2012-02-16 DIAGNOSIS — E1142 Type 2 diabetes mellitus with diabetic polyneuropathy: Secondary | ICD-10-CM

## 2012-02-16 DIAGNOSIS — E785 Hyperlipidemia, unspecified: Secondary | ICD-10-CM

## 2012-02-16 LAB — BASIC METABOLIC PANEL
Calcium: 9.3 mg/dL (ref 8.4–10.5)
Creat: 1.47 mg/dL — ABNORMAL HIGH (ref 0.50–1.35)
Glucose, Bld: 207 mg/dL — ABNORMAL HIGH (ref 70–99)
Sodium: 138 mEq/L (ref 135–145)

## 2012-02-16 LAB — LIPID PANEL
LDL Cholesterol: 54 mg/dL (ref 0–99)
Total CHOL/HDL Ratio: 3.8 Ratio
Triglycerides: 202 mg/dL — ABNORMAL HIGH (ref <150)
VLDL: 40 mg/dL (ref 0–40)

## 2012-02-16 LAB — HEPATIC FUNCTION PANEL
Albumin: 4.1 g/dL (ref 3.5–5.2)
Bilirubin, Direct: 0.1 mg/dL (ref 0.0–0.3)
Total Bilirubin: 0.5 mg/dL (ref 0.3–1.2)

## 2012-02-16 LAB — PROTIME-INR
INR: 2.14 — ABNORMAL HIGH (ref <1.50)
Prothrombin Time: 23.2 seconds — ABNORMAL HIGH (ref 11.6–15.2)

## 2012-02-16 NOTE — Assessment & Plan Note (Signed)
Will check PSA Must improve lifestyle Flu vaccine

## 2012-02-16 NOTE — Assessment & Plan Note (Signed)
Paced Remains on amiodarone and coumadin

## 2012-02-16 NOTE — Progress Notes (Signed)
Subjective:    Patient ID: Craig Lindsey, male    DOB: 06-Sep-1960, 52 y.o.   MRN: [id number]  HPI Doing okay Here for physical Has had recent oncology and cardiology follow ups  Knows he has gained a lot of weight Had a month off his knee but this is better now Discussed proper eating  Checking sugars 3 times per week Fasting usually 150 Postprandial 180-190 No hypoglycemia  Current Outpatient Prescriptions on File Prior to Visit  Medication Sig Dispense Refill  . amiodarone (PACERONE) 200 MG tablet Take 1 tablet (200 mg total) by mouth daily.  60 tablet  6  . atorvastatin (LIPITOR) 10 MG tablet Take 1 tablet (10 mg total) by mouth daily.  90 tablet  3  . BD ULTRA-FINE PEN NEEDLES 29G X 12.7MM MISC USE WITH INSULIN PEN AS DIRECTED  100 each  3  . diltiazem (DILACOR XR) 240 MG 24 hr capsule Take 1 capsule (240 mg total) by mouth daily.  60 capsule  5  . furosemide (LASIX) 40 MG tablet Take 1 tablet (40 mg total) by mouth daily.  30 tablet  1  . glipiZIDE (GLUCOTROL XL) 10 MG 24 hr tablet Take 1 tablet (10 mg total) by mouth daily.  60 tablet  5  . insulin glargine (LANTUS SOLOSTAR) 100 UNIT/ML injection Inject 27 Units into the skin at bedtime.  5 pen  3  . Insulin Pen Needle 29G X 12.7MM MISC Inject 20 Units into the muscle daily.        Marland Kitchen lisinopril (PRINIVIL,ZESTRIL) 40 MG tablet Take 1 tablet (40 mg total) by mouth daily.  90 tablet  3  . metFORMIN (GLUCOPHAGE) 1000 MG tablet Take 1 tablet (1,000 mg total) by mouth 2 (two) times daily.  120 tablet  5  . metoprolol succinate (TOPROL-XL) 100 MG 24 hr tablet Take one and one half by mouth daily  90 tablet  5  . multivitamin (THERAGRAN) per tablet Take 1 tablet by mouth daily.        . NON FORMULARY Knee high compression hose 20-30 mmhg. Apply daily and remove at bedtime.       Marland Kitchen spironolactone (ALDACTONE) 50 MG tablet Take 1 tablet (50 mg total) by mouth 2 (two) times daily.  120 tablet  5  . warfarin (COUMADIN) 4 MG tablet Use as  directed  120 tablet  5    No Known Allergies  Past Medical History  Diagnosis Date  . Diabetes mellitus     Type II  . Hyperlipidemia   . Hypertension   . Arrhythmia 12/04    1. Ventricular tachycardia; Dr. Ladona Ridgel  2. Atrial fibrillation  . Sleep apnea   . Obesity   . Hemorrhoids   . Personal history of colon cancer, stage III 1/09    Dr. Leone Payor, Truett Perna, D. Newman  . Neuropathy     Oxaliplatin  . DVT (deep venous thrombosis) 7/09    RLE, Tx Arixtra  . Chronic kidney disease     Complex L renal cyst  . Sigmoid colon ulcer     Sigmoid; Raelyn Mora, MD (colon cancer)    Past Surgical History  Procedure Date  . Cardiac defibrillator placement   . Colectomy 1/09    Sigmoid; Raelyn Mora, MD (colon cancer)  . Cholecystectomy   . Hernia repair     umb hernia    Family History  Problem Relation Age of Onset  . Diabetes Father   . Diabetes Maternal Grandmother   .  Hypertension Neg Hx   . Coronary artery disease Neg Hx   . Prostate cancer Neg Hx   . Colon cancer Other   . Cancer Maternal Aunt     colon    History   Social History  . Marital Status: Married    Spouse Name: N/A    Number of Children: 2  . Years of Education: N/A   Occupational History  . [employer]     Disabled   Social History Main Topics  . Smoking status: Never Smoker   . Smokeless tobacco: Never Used  . Alcohol Use: No  . Drug Use: No  . Sexually Active: Not on file   Other Topics Concern  . Not on file   Social History Narrative   2 step children from wife's 2nd marriage.   Review of Systems  Constitutional: Positive for unexpected weight change. Negative for fatigue.       Wears seat belt  HENT: Negative for hearing loss, congestion, rhinorrhea, dental problem and tinnitus.        Regular with dentist  Eyes: Negative for visual disturbance.       No unilateral vision loss  Respiratory: Positive for shortness of breath. Negative for cough and chest tightness.         Stable DOE  Cardiovascular: Positive for palpitations and leg swelling. Negative for chest pain.       Feels heart with exertion  Gastrointestinal: Negative for nausea, vomiting, abdominal pain, diarrhea and blood in stool.       Ventral hernia somewhat bigger--no pain No heartburn  Genitourinary:       Some dribbing No sexual problems  Musculoskeletal: Positive for arthralgias. Negative for back pain and joint swelling.       Knee feels better  Skin: Negative for rash.       No suspicious lesions--has some stable white keratoses on right palm  Neurological: Positive for numbness. Negative for dizziness, syncope, weakness, light-headedness and headaches.       Some tingling and decreased sensation in feet  Hematological: Negative for adenopathy. Does not bruise/bleed easily.  Psychiatric/Behavioral: Negative for sleep disturbance and dysphoric mood. The patient is not nervous/anxious.        Uses CPAP-10       Objective:   Physical Exam  Constitutional: He appears well-developed. No distress.  HENT:  Head: Normocephalic and atraumatic.  Right Ear: External ear normal.  Left Ear: External ear normal.  Mouth/Throat: Oropharynx is clear and moist.  Eyes: Conjunctivae normal and EOM are normal. Pupils are equal, round, and reactive to light.  Neck: Normal range of motion. Neck supple. No thyromegaly present.  Cardiovascular: Normal rate, regular rhythm, normal heart sounds and intact distal pulses.  Exam reveals no gallop.   No murmur heard. Pulmonary/Chest: Effort normal and breath sounds normal. No respiratory distress. He has no wheezes. He has no rales.  Abdominal: Soft. There is no tenderness.  Musculoskeletal:       Thick calves without pitting  Lymphadenopathy:    He has no cervical adenopathy.  Neurological:       Mildly decreased fine touch sensation on plantar feet  Skin: No rash noted. No erythema.       callouses on plantar feet No ulcers  Psychiatric: He has a  normal mood and affect. His behavior is normal.          Assessment & Plan:

## 2012-02-16 NOTE — Patient Instructions (Signed)

## 2012-02-16 NOTE — Assessment & Plan Note (Signed)
No sig pain No Rx indicated now

## 2012-02-16 NOTE — Addendum Note (Signed)
Addended by: Alvina Chou on: 02/16/2012 02:48 PM   Modules accepted: Orders

## 2012-02-16 NOTE — Assessment & Plan Note (Signed)
BP Readings from Last 3 Encounters:  02/16/12 132/64  01/02/12 160/70  11/29/11 148/70   Okay today No changes  Due for labs

## 2012-02-16 NOTE — Addendum Note (Signed)
Addended by: Sueanne Margarita on: 02/16/2012 03:36 PM   Modules accepted: Orders

## 2012-02-16 NOTE — Assessment & Plan Note (Signed)
Probably worse with weight gain Discussed eating and exercise Will recheck in 3 months if only mildly worse Add [place] if much worse

## 2012-02-17 LAB — CBC WITH DIFFERENTIAL/PLATELET
Basophils Absolute: 0 10*3/uL (ref 0.0–0.1)
Basophils Relative: 0 % (ref 0–1)
HCT: 42.6 % (ref 39.0–52.0)
Lymphocytes Relative: 19 % (ref 12–46)
MCHC: 34.3 g/dL (ref 30.0–36.0)
Monocytes Absolute: 0.6 10*3/uL (ref 0.1–1.0)
Neutro Abs: 6.7 10*3/uL (ref 1.7–7.7)
Neutrophils Relative %: 73 % (ref 43–77)
Platelets: 188 10*3/uL (ref 150–400)
RDW: 14.1 % (ref 11.5–15.5)
WBC: 9.4 10*3/uL (ref 4.0–10.5)

## 2012-02-17 LAB — PSA: PSA: 0.33 ng/mL (ref <=4.00)

## 2012-02-17 LAB — TSH: TSH: 1.211 u[IU]/mL (ref 0.350–4.500)

## 2012-02-19 ENCOUNTER — Ambulatory Visit (INDEPENDENT_AMBULATORY_CARE_PROVIDER_SITE_OTHER): Payer: BC Managed Care – PPO | Admitting: General Practice

## 2012-02-19 NOTE — Patient Instructions (Signed)
Take 6 mg today and tomorrow and then change dosage to 6 mg daily except 8 mg on Monday and Thursday.  Re-check in 2 weeks.

## 2012-02-20 ENCOUNTER — Encounter: Payer: Self-pay | Admitting: Internal Medicine

## 2012-02-20 MED ORDER — SITAGLIPTIN PHOSPHATE 100 MG PO TABS: 100.0000 mg | ORAL_TABLET | Freq: Every day | ORAL | Status: DC

## 2012-02-20 NOTE — Telephone Encounter (Signed)
Please advise if he needs to take all meds

## 2012-02-21 ENCOUNTER — Encounter: Payer: Self-pay | Admitting: Internal Medicine

## 2012-02-21 NOTE — Telephone Encounter (Signed)
Please advise on a generic or substitute?

## 2012-02-26 ENCOUNTER — Encounter: Payer: Self-pay | Admitting: Internal Medicine

## 2012-02-27 NOTE — Telephone Encounter (Signed)
.  dee

## 2012-02-27 NOTE — Telephone Encounter (Signed)
Dr.Letvak please advise on a different medication.

## 2012-03-07 ENCOUNTER — Ambulatory Visit (INDEPENDENT_AMBULATORY_CARE_PROVIDER_SITE_OTHER): Payer: BC Managed Care – PPO | Admitting: General Practice

## 2012-03-07 DIAGNOSIS — Z5181 Encounter for therapeutic drug level monitoring: Secondary | ICD-10-CM

## 2012-03-07 DIAGNOSIS — Z7901 Long term (current) use of anticoagulants: Secondary | ICD-10-CM

## 2012-03-07 DIAGNOSIS — I82409 Acute embolism and thrombosis of unspecified deep veins of unspecified lower extremity: Secondary | ICD-10-CM

## 2012-03-07 LAB — POCT INR: INR: 3.3

## 2012-03-19 ENCOUNTER — Other Ambulatory Visit: Payer: BC Managed Care – PPO

## 2012-03-20 ENCOUNTER — Telehealth (INDEPENDENT_AMBULATORY_CARE_PROVIDER_SITE_OTHER): Payer: Self-pay

## 2012-03-20 NOTE — Telephone Encounter (Signed)
Pt aware of appt 05/01/12 @ 10am with Dr. Ezzard Standing

## 2012-03-26 ENCOUNTER — Other Ambulatory Visit: Payer: BC Managed Care – PPO

## 2012-03-27 ENCOUNTER — Other Ambulatory Visit (INDEPENDENT_AMBULATORY_CARE_PROVIDER_SITE_OTHER): Payer: BC Managed Care – PPO

## 2012-03-27 DIAGNOSIS — I10 Essential (primary) hypertension: Secondary | ICD-10-CM

## 2012-03-27 DIAGNOSIS — I4891 Unspecified atrial fibrillation: Secondary | ICD-10-CM

## 2012-03-27 LAB — BASIC METABOLIC PANEL
Calcium: 8.5 mg/dL (ref 8.4–10.5)
GFR: 43.17 mL/min — ABNORMAL LOW (ref >60.00)
Glucose, Bld: 188 mg/dL — ABNORMAL HIGH (ref 70–99)
Potassium: 5 mEq/L (ref 3.5–5.1)
Sodium: 138 mEq/L (ref 135–145)

## 2012-03-31 ENCOUNTER — Other Ambulatory Visit: Payer: Self-pay | Admitting: Internal Medicine

## 2012-04-04 ENCOUNTER — Ambulatory Visit (INDEPENDENT_AMBULATORY_CARE_PROVIDER_SITE_OTHER): Payer: BC Managed Care – PPO | Admitting: General Practice

## 2012-04-04 DIAGNOSIS — I82409 Acute embolism and thrombosis of unspecified deep veins of unspecified lower extremity: Secondary | ICD-10-CM

## 2012-04-04 DIAGNOSIS — Z7901 Long term (current) use of anticoagulants: Secondary | ICD-10-CM

## 2012-04-04 DIAGNOSIS — Z5181 Encounter for therapeutic drug level monitoring: Secondary | ICD-10-CM

## 2012-04-04 LAB — POCT INR: INR: 3.1

## 2012-04-04 NOTE — Patient Instructions (Signed)
Continue to take 6 mg daily except 8 mg on Monday and Thursday.  Re-check in 4 weeks.

## 2012-04-08 ENCOUNTER — Encounter: Payer: BC Managed Care – PPO | Admitting: *Deleted

## 2012-04-11 ENCOUNTER — Other Ambulatory Visit: Payer: Self-pay | Admitting: Internal Medicine

## 2012-04-11 ENCOUNTER — Ambulatory Visit (INDEPENDENT_AMBULATORY_CARE_PROVIDER_SITE_OTHER): Payer: BC Managed Care – PPO | Admitting: *Deleted

## 2012-04-11 DIAGNOSIS — I4729 Other ventricular tachycardia: Secondary | ICD-10-CM

## 2012-04-11 DIAGNOSIS — Z95810 Presence of automatic (implantable) cardiac defibrillator: Secondary | ICD-10-CM

## 2012-04-11 DIAGNOSIS — I472 Ventricular tachycardia: Secondary | ICD-10-CM

## 2012-04-18 ENCOUNTER — Encounter: Payer: Self-pay | Admitting: *Deleted

## 2012-04-19 ENCOUNTER — Other Ambulatory Visit: Payer: Self-pay | Admitting: *Deleted

## 2012-04-19 LAB — REMOTE ICD DEVICE
BATTERY VOLTAGE: 3.1057 V
BRDY-0002RV: 50 {beats}/min
PACEART VT: 0
RV LEAD AMPLITUDE: 11.3 mv
TOT-0006: 20110706000000
TZAT-0001SLOWVT: 1
TZAT-0001SLOWVT: 2
TZAT-0002FASTVT: NEGATIVE
TZAT-0011SLOWVT: 10 ms
TZAT-0011SLOWVT: 10 ms
TZAT-0012FASTVT: 200 ms
TZAT-0018SLOWVT: NEGATIVE
TZAT-0018SLOWVT: NEGATIVE
TZAT-0019SLOWVT: 8 V
TZAT-0019SLOWVT: 8 V
TZAT-0020SLOWVT: 1.5 ms
TZAT-0020SLOWVT: 1.5 ms
TZON-0003VSLOWVT: 450 ms
TZON-0004VSLOWVT: 20
TZON-0005SLOWVT: 12
TZST-0001FASTVT: 3
TZST-0001FASTVT: 5
TZST-0001SLOWVT: 4
TZST-0001SLOWVT: 5
TZST-0002FASTVT: NEGATIVE
TZST-0002FASTVT: NEGATIVE
TZST-0002FASTVT: NEGATIVE
TZST-0003SLOWVT: 35 J
VENTRICULAR PACING ICD: 0.01 pct
VF: 0

## 2012-04-19 MED ORDER — FUROSEMIDE 40 MG PO TABS: 40.0000 mg | ORAL_TABLET | Freq: Every day | ORAL | Status: DC

## 2012-04-19 NOTE — Telephone Encounter (Signed)
Rx sent to pharmacy by escript  

## 2012-05-01 ENCOUNTER — Ambulatory Visit (INDEPENDENT_AMBULATORY_CARE_PROVIDER_SITE_OTHER): Payer: BC Managed Care – PPO | Admitting: Surgery

## 2012-05-02 ENCOUNTER — Ambulatory Visit: Payer: BC Managed Care – PPO

## 2012-05-06 ENCOUNTER — Encounter: Payer: Self-pay | Admitting: General Practice

## 2012-05-14 ENCOUNTER — Encounter: Payer: Self-pay | Admitting: *Deleted

## 2012-05-15 ENCOUNTER — Other Ambulatory Visit: Payer: Self-pay | Admitting: *Deleted

## 2012-05-15 ENCOUNTER — Encounter: Payer: Self-pay | Admitting: Internal Medicine

## 2012-05-15 MED ORDER — INSULIN GLARGINE 100 UNIT/ML ~~LOC~~ SOLN: 27.0000 [IU] | Freq: Every day | SUBCUTANEOUS | Status: DC

## 2012-05-20 ENCOUNTER — Ambulatory Visit: Payer: BC Managed Care – PPO

## 2012-05-20 ENCOUNTER — Ambulatory Visit: Payer: BC Managed Care – PPO | Admitting: Internal Medicine

## 2012-06-20 ENCOUNTER — Telehealth: Payer: Self-pay | Admitting: General Practice

## 2012-06-20 ENCOUNTER — Other Ambulatory Visit: Payer: Self-pay | Admitting: General Practice

## 2012-06-20 NOTE — Telephone Encounter (Signed)
This RN called [facility] [address] (spoke with Marchelle Folks). Cancelled 2 refills for coumadin.  Patient needs to come to coumadin clinic to have INR checked.  Marchelle Folks says that pt just picked up 60 tablets this week.

## 2012-06-20 NOTE — Telephone Encounter (Signed)
This RN called patient's pharmacy ([facility] [address] in [place]) and cancelled 2 refills of warfarin.  Patient needs to have INR checked before any further re-fills.

## 2012-07-16 ENCOUNTER — Other Ambulatory Visit: Payer: Self-pay | Admitting: *Deleted

## 2012-07-16 MED ORDER — AMIODARONE HCL 200 MG PO TABS: 200.0000 mg | ORAL_TABLET | Freq: Every day | ORAL | Status: DC

## 2012-07-17 ENCOUNTER — Encounter: Payer: Self-pay | Admitting: Internal Medicine

## 2012-07-22 ENCOUNTER — Ambulatory Visit (INDEPENDENT_AMBULATORY_CARE_PROVIDER_SITE_OTHER): Payer: Medicare Other | Admitting: *Deleted

## 2012-07-22 DIAGNOSIS — I4729 Other ventricular tachycardia: Secondary | ICD-10-CM

## 2012-07-22 DIAGNOSIS — Z95810 Presence of automatic (implantable) cardiac defibrillator: Secondary | ICD-10-CM

## 2012-07-22 DIAGNOSIS — I472 Ventricular tachycardia: Secondary | ICD-10-CM

## 2012-07-23 LAB — REMOTE ICD DEVICE
BATTERY VOLTAGE: 3.1193 V
BRDY-0002RV: 50 {beats}/min
FVT: 0
PACEART VT: 0
TOT-0006: 20110706000000
TZAT-0001SLOWVT: 1
TZAT-0001SLOWVT: 2
TZAT-0002FASTVT: NEGATIVE
TZAT-0005SLOWVT: 88 pct
TZAT-0012FASTVT: 200 ms
TZAT-0018FASTVT: NEGATIVE
TZAT-0018SLOWVT: NEGATIVE
TZAT-0018SLOWVT: NEGATIVE
TZAT-0019FASTVT: 8 V
TZAT-0019SLOWVT: 8 V
TZAT-0019SLOWVT: 8 V
TZAT-0020FASTVT: 1.5 ms
TZON-0003VSLOWVT: 450 ms
TZON-0004VSLOWVT: 20
TZON-0005SLOWVT: 12
TZST-0001FASTVT: 3
TZST-0001FASTVT: 5
TZST-0001SLOWVT: 3
TZST-0001SLOWVT: 5
TZST-0002FASTVT: NEGATIVE
TZST-0002FASTVT: NEGATIVE
TZST-0002FASTVT: NEGATIVE
TZST-0003SLOWVT: 20 J
TZST-0003SLOWVT: 35 J
VENTRICULAR PACING ICD: 0.01 pct
VF: 0

## 2012-07-26 ENCOUNTER — Encounter: Payer: Self-pay | Admitting: *Deleted

## 2012-08-15 ENCOUNTER — Ambulatory Visit: Payer: BC Managed Care – PPO | Admitting: Internal Medicine

## 2012-08-19 ENCOUNTER — Other Ambulatory Visit: Payer: Self-pay

## 2012-08-19 ENCOUNTER — Encounter: Payer: Self-pay | Admitting: Internal Medicine

## 2012-08-19 ENCOUNTER — Telehealth: Payer: Self-pay | Admitting: *Deleted

## 2012-08-19 DIAGNOSIS — I10 Essential (primary) hypertension: Secondary | ICD-10-CM

## 2012-08-19 MED ORDER — GLIPIZIDE ER 10 MG PO TB24: 10.0000 mg | ORAL_TABLET | Freq: Every day | ORAL | Status: DC

## 2012-08-19 MED ORDER — METFORMIN HCL 1000 MG PO TABS: 1000.0000 mg | ORAL_TABLET | Freq: Two times a day (BID) | ORAL | Status: DC

## 2012-08-19 MED ORDER — AMIODARONE HCL 200 MG PO TABS: 200.0000 mg | ORAL_TABLET | Freq: Every day | ORAL | Status: DC

## 2012-08-19 MED ORDER — SPIRONOLACTONE 50 MG PO TABS: 50.0000 mg | ORAL_TABLET | Freq: Two times a day (BID) | ORAL | Status: DC

## 2012-08-19 MED ORDER — METOPROLOL SUCCINATE ER 100 MG PO TB24: ORAL_TABLET | ORAL | Status: DC

## 2012-08-19 MED ORDER — DILTIAZEM HCL ER 240 MG PO CP24
240.0000 mg | ORAL_CAPSULE | Freq: Every day | ORAL | Status: DC
Start: 2012-08-19 — End: 2012-08-20

## 2012-08-19 NOTE — Telephone Encounter (Addendum)
Advised pt that : I have refilled your prescriptions for 1 month and will refill the rest at your appt later on this month. I can not refill your coumadin per the coumadin clinic until you come in to have your PT/INR checked, your last check was 03/2012.  rx sent to pharmacy by e-script Pt has appt 09/04/12 with Dr. Alphonsus Sias Patient has 7 no-shows

## 2012-08-19 NOTE — Telephone Encounter (Signed)
Please urge him to both continue the coumadin, and come in as soon as possible to have his protime checked

## 2012-08-20 ENCOUNTER — Other Ambulatory Visit: Payer: Self-pay | Admitting: *Deleted

## 2012-08-20 DIAGNOSIS — I10 Essential (primary) hypertension: Secondary | ICD-10-CM

## 2012-08-20 MED ORDER — SPIRONOLACTONE 50 MG PO TABS: 50.0000 mg | ORAL_TABLET | Freq: Two times a day (BID) | ORAL | Status: DC

## 2012-08-20 MED ORDER — METFORMIN HCL 1000 MG PO TABS: 1000.0000 mg | ORAL_TABLET | Freq: Two times a day (BID) | ORAL | Status: DC

## 2012-08-20 MED ORDER — GLIPIZIDE ER 10 MG PO TB24: 10.0000 mg | ORAL_TABLET | Freq: Every day | ORAL | Status: DC

## 2012-08-20 MED ORDER — DILTIAZEM HCL ER 240 MG PO CP24: 240.0000 mg | ORAL_CAPSULE | Freq: Every day | ORAL | Status: DC

## 2012-08-20 MED ORDER — METOPROLOL SUCCINATE ER 100 MG PO TB24: ORAL_TABLET | ORAL | Status: DC

## 2012-08-20 NOTE — Telephone Encounter (Signed)
Spoke with patient and advised results Pt made appt for coumadin clinic 08/22/12

## 2012-08-21 NOTE — Telephone Encounter (Signed)
Patient needs an appt, have spoken to him and he will make an appt for the coumadin clinic

## 2012-08-22 ENCOUNTER — Ambulatory Visit (INDEPENDENT_AMBULATORY_CARE_PROVIDER_SITE_OTHER): Payer: Medicare Other | Admitting: Family Medicine

## 2012-08-22 DIAGNOSIS — Z5181 Encounter for therapeutic drug level monitoring: Secondary | ICD-10-CM

## 2012-08-22 DIAGNOSIS — Z7901 Long term (current) use of anticoagulants: Secondary | ICD-10-CM

## 2012-08-22 DIAGNOSIS — I82409 Acute embolism and thrombosis of unspecified deep veins of unspecified lower extremity: Secondary | ICD-10-CM

## 2012-08-22 NOTE — Patient Instructions (Signed)
Continue to take 6 mg daily except 8 mg on Monday and Thursday.  Re-check in 6 weeks per patient request.

## 2012-08-23 ENCOUNTER — Encounter: Payer: Self-pay | Admitting: Internal Medicine

## 2012-08-23 ENCOUNTER — Telehealth: Payer: Self-pay

## 2012-08-23 MED ORDER — WARFARIN SODIUM 4 MG PO TABS: ORAL_TABLET | ORAL | Status: DC

## 2012-08-23 NOTE — Telephone Encounter (Signed)
Arline Asp, can you help me with this Carlena Sax is out today.

## 2012-08-23 NOTE — Telephone Encounter (Signed)
Pt notified Dr Para March sent in refill warfarin. Pt will ck with pharmacy for pick up.

## 2012-08-23 NOTE — Telephone Encounter (Signed)
Sent. Thanks.   

## 2012-09-04 ENCOUNTER — Ambulatory Visit (INDEPENDENT_AMBULATORY_CARE_PROVIDER_SITE_OTHER): Payer: Medicare Other | Admitting: Internal Medicine

## 2012-09-04 ENCOUNTER — Encounter: Payer: Self-pay | Admitting: Internal Medicine

## 2012-09-04 VITALS — BP 120/70 | HR 86 | Temp 98.3°F | Ht 76.0 in | Wt >= 6400 oz

## 2012-09-04 DIAGNOSIS — E669 Obesity, unspecified: Secondary | ICD-10-CM

## 2012-09-04 DIAGNOSIS — I4891 Unspecified atrial fibrillation: Secondary | ICD-10-CM

## 2012-09-04 DIAGNOSIS — I10 Essential (primary) hypertension: Secondary | ICD-10-CM

## 2012-09-04 DIAGNOSIS — E1149 Type 2 diabetes mellitus with other diabetic neurological complication: Secondary | ICD-10-CM

## 2012-09-04 LAB — BASIC METABOLIC PANEL
GFR: 40.2 mL/min — ABNORMAL LOW (ref >60.00)
Glucose, Bld: 188 mg/dL — ABNORMAL HIGH (ref 70–99)
Potassium: 5.1 mEq/L (ref 3.5–5.1)
Sodium: 135 mEq/L (ref 135–145)

## 2012-09-04 NOTE — Assessment & Plan Note (Signed)
Working on better eating  Starting water aerobics

## 2012-09-04 NOTE — Progress Notes (Signed)
Subjective:    Patient ID: Craig Lindsey, male    DOB: 1960/08/25, 52 y.o.   MRN: [id number]  HPI Here for follow up Feels okay Weight is up a few pounds Hasn't been compliant  Now working on diet with wife (healthier) Starting water aerobics  No problems with [place] Checking sugars fasting and after eating--- bid Usually in the 150's No hypoglycemic reactions No ulcers or pain in feet---same neuropathy  No chest pain No SOB No dizziness or sycnope No major edema---just slight  No palpitations--but can tell it is going fast when up and down steps  Current Outpatient Prescriptions on File Prior to Visit  Medication Sig Dispense Refill  . amiodarone (PACERONE) 200 MG tablet Take 1 tablet (200 mg total) by mouth daily.  90 tablet  1  . atorvastatin (LIPITOR) 10 MG tablet TAKE 1 TABLET (10 MG TOTAL) BY MOUTH DAILY.  90 tablet  3  . BD ULTRA-FINE PEN NEEDLES 29G X 12.7MM MISC USE WITH INSULIN PEN AS DIRECTED  100 each  3  . furosemide (LASIX) 40 MG tablet Take 1 tablet (40 mg total) by mouth daily.  30 tablet  11  . glipiZIDE (GLUCOTROL XL) 10 MG 24 hr tablet Take 1 tablet (10 mg total) by mouth daily.  30 tablet  3  . insulin glargine (LANTUS) 100 UNIT/ML injection Inject 0.27 mLs (27 Units total) into the skin at bedtime.  15 mL  5  . Insulin Pen Needle 29G X 12.7MM MISC Inject 20 Units into the muscle daily.        Marland Kitchen lisinopril (PRINIVIL,ZESTRIL) 40 MG tablet TAKE 1 TABLET (40 MG TOTAL) BY MOUTH DAILY.  90 tablet  3  . metFORMIN (GLUCOPHAGE) 1000 MG tablet Take 1 tablet (1,000 mg total) by mouth 2 (two) times daily.  60 tablet  3  . metoprolol succinate (TOPROL-XL) 100 MG 24 hr tablet Take one and one half by mouth daily  45 tablet  3  . multivitamin (THERAGRAN) per tablet Take 1 tablet by mouth daily.        . NON FORMULARY Knee high compression hose 20-30 mmhg. Apply daily and remove at bedtime.       . sitaGLIPtin (JANUVIA) 100 MG tablet Take 1 tablet (100 mg total) by mouth  daily.  90 tablet  3  . spironolactone (ALDACTONE) 50 MG tablet Take 1 tablet (50 mg total) by mouth 2 (two) times daily.  60 tablet  3  . warfarin (COUMADIN) 4 MG tablet take 6 mg daily except 8 mg on Monday and Thursday  120 tablet  5   No current facility-administered medications on file prior to visit.    No Known Allergies  Past Medical History  Diagnosis Date  . Diabetes mellitus     Type II  . Hyperlipidemia   . Hypertension   . Arrhythmia 12/04    1. Ventricular tachycardia; Dr. Ladona Ridgel  2. Atrial fibrillation  . Sleep apnea   . Obesity   . Hemorrhoids   . Personal history of colon cancer, stage III 1/09    Dr. Leone Payor, Truett Perna, D. Newman  . Neuropathy     Oxaliplatin  . DVT (deep venous thrombosis) 7/09    RLE, Tx Arixtra  . Chronic kidney disease     Complex L renal cyst  . Sigmoid colon ulcer     Sigmoid; Raelyn Mora, MD (colon cancer)    Past Surgical History  Procedure Laterality Date  . Cardiac defibrillator placement    .  Colectomy  1/09    Sigmoid; Raelyn Mora, MD (colon cancer)  . Cholecystectomy    . Hernia repair      umb hernia    Family History  Problem Relation Age of Onset  . Diabetes Father   . Diabetes Maternal Grandmother   . Hypertension Neg Hx   . Coronary artery disease Neg Hx   . Prostate cancer Neg Hx   . Colon cancer Other   . Cancer Maternal Aunt     colon    History   Social History  . Marital Status: Married    Spouse Name: N/A    Number of Children: 2  . Years of Education: N/A   Occupational History  . [employer]     Disabled   Social History Main Topics  . Smoking status: Never Smoker   . Smokeless tobacco: Never Used  . Alcohol Use: No  . Drug Use: No  . Sexual Activity: Not on file   Other Topics Concern  . Not on file   Social History Narrative   2 step children from wife's 2nd marriage.   Review of Systems Sleeps okay Bowels fine Voids fine    Objective:   Physical Exam  Constitutional: No  distress.  Neck: Normal range of motion. Neck supple.  Cardiovascular: Normal rate, regular rhythm and intact distal pulses.  Exam reveals no gallop.   No murmur heard. Distant heart sounds Faint pedal pulses  Pulmonary/Chest: Effort normal and breath sounds normal. No respiratory distress. He has no wheezes. He has no rales.  Musculoskeletal:  Thick calves without pitting Venous stasis changes  Lymphadenopathy:    He has no cervical adenopathy.  Skin: He is not diaphoretic.  No ulcers  Psychiatric: He has a normal mood and affect. His behavior is normal.          Assessment & Plan:

## 2012-09-04 NOTE — Assessment & Plan Note (Signed)
BP Readings from Last 3 Encounters:  09/04/12 120/70  02/16/12 132/64  01/02/12 160/70   Good control Lab Results  Component Value Date   CREATININE 1.8* 03/27/2012   Will monitor creat May need nephrology eval

## 2012-09-04 NOTE — Assessment & Plan Note (Signed)
Hopefully better with the [place]

## 2012-09-04 NOTE — Assessment & Plan Note (Signed)
Paced/ICD On coumadin

## 2012-09-09 ENCOUNTER — Other Ambulatory Visit: Payer: Self-pay | Admitting: *Deleted

## 2012-09-09 MED ORDER — SPIRONOLACTONE 50 MG PO TABS: 50.0000 mg | ORAL_TABLET | Freq: Two times a day (BID) | ORAL | Status: DC

## 2012-10-03 ENCOUNTER — Ambulatory Visit: Payer: Medicare Other

## 2012-10-07 ENCOUNTER — Ambulatory Visit: Payer: Medicare Other

## 2012-10-10 ENCOUNTER — Ambulatory Visit (INDEPENDENT_AMBULATORY_CARE_PROVIDER_SITE_OTHER): Payer: Medicare Other | Admitting: Family Medicine

## 2012-10-10 DIAGNOSIS — I82409 Acute embolism and thrombosis of unspecified deep veins of unspecified lower extremity: Secondary | ICD-10-CM

## 2012-10-10 DIAGNOSIS — Z7901 Long term (current) use of anticoagulants: Secondary | ICD-10-CM

## 2012-10-10 DIAGNOSIS — Z5181 Encounter for therapeutic drug level monitoring: Secondary | ICD-10-CM

## 2012-10-10 LAB — POCT INR: INR: 3.4

## 2012-10-10 NOTE — Patient Instructions (Addendum)
Continue to take 6 mg daily except 8 mg on Monday and Thursday.  Re-check in 6 weeks per pt request.

## 2012-10-28 ENCOUNTER — Ambulatory Visit (INDEPENDENT_AMBULATORY_CARE_PROVIDER_SITE_OTHER): Payer: Medicare Other | Admitting: *Deleted

## 2012-10-28 DIAGNOSIS — I472 Ventricular tachycardia: Secondary | ICD-10-CM

## 2012-10-28 DIAGNOSIS — Z95810 Presence of automatic (implantable) cardiac defibrillator: Secondary | ICD-10-CM

## 2012-11-11 LAB — REMOTE ICD DEVICE
BATTERY VOLTAGE: 3.1057 V
BRDY-0002RV: 50 {beats}/min
PACEART VT: 0
TZAT-0001SLOWVT: 1
TZAT-0001SLOWVT: 2
TZAT-0011SLOWVT: 10 ms
TZAT-0011SLOWVT: 10 ms
TZAT-0012FASTVT: 200 ms
TZAT-0019FASTVT: 8 V
TZAT-0019SLOWVT: 8 V
TZAT-0019SLOWVT: 8 V
TZAT-0020FASTVT: 1.5 ms
TZAT-0020SLOWVT: 1.5 ms
TZON-0003VSLOWVT: 450 ms
TZON-0004VSLOWVT: 20
TZST-0001FASTVT: 5
TZST-0001SLOWVT: 3
TZST-0001SLOWVT: 4
TZST-0001SLOWVT: 5
TZST-0002FASTVT: NEGATIVE
TZST-0002FASTVT: NEGATIVE
TZST-0003SLOWVT: 20 J
TZST-0003SLOWVT: 35 J
VENTRICULAR PACING ICD: 0.02 pct
VF: 0

## 2012-11-14 ENCOUNTER — Encounter: Payer: Self-pay | Admitting: *Deleted

## 2012-11-21 ENCOUNTER — Ambulatory Visit: Payer: Medicare Other

## 2012-11-21 ENCOUNTER — Other Ambulatory Visit: Payer: Self-pay

## 2012-11-26 ENCOUNTER — Ambulatory Visit (HOSPITAL_BASED_OUTPATIENT_CLINIC_OR_DEPARTMENT_OTHER): Payer: Medicare Other | Admitting: Oncology

## 2012-11-26 ENCOUNTER — Encounter: Payer: Self-pay | Admitting: Internal Medicine

## 2012-11-26 ENCOUNTER — Other Ambulatory Visit (HOSPITAL_BASED_OUTPATIENT_CLINIC_OR_DEPARTMENT_OTHER): Payer: Medicare Other | Admitting: Lab

## 2012-11-26 VITALS — BP 132/73 | HR 75 | Temp 97.8°F | Resp 21 | Ht 76.0 in | Wt >= 6400 oz

## 2012-11-26 DIAGNOSIS — Z85038 Personal history of other malignant neoplasm of large intestine: Secondary | ICD-10-CM

## 2012-11-26 DIAGNOSIS — Z86718 Personal history of other venous thrombosis and embolism: Secondary | ICD-10-CM

## 2012-11-26 DIAGNOSIS — Z23 Encounter for immunization: Secondary | ICD-10-CM

## 2012-11-26 DIAGNOSIS — Z7901 Long term (current) use of anticoagulants: Secondary | ICD-10-CM

## 2012-11-26 DIAGNOSIS — E119 Type 2 diabetes mellitus without complications: Secondary | ICD-10-CM

## 2012-11-26 DIAGNOSIS — I4891 Unspecified atrial fibrillation: Secondary | ICD-10-CM

## 2012-11-26 MED ORDER — INFLUENZA VAC SPLIT QUAD 0.5 ML IM SUSP
0.5000 mL | INTRAMUSCULAR | Status: AC
  Administered 2012-11-26: 0.5 mL via INTRAMUSCULAR
  Filled 2012-11-26: qty 0.5

## 2012-11-26 NOTE — Progress Notes (Signed)
   [facility]    OFFICE PROGRESS NOTE   INTERVAL HISTORY:   Mr. Scheck returns for scheduled followup of colon cancer. He feels well. No difficulty with bowel function. Good appetite and energy level. He plans to see Dr. Ezzard Standing to consider repair of an abdominal hernia.  Objective:  Vital signs in last 24 hours:  Blood pressure 132/73, pulse 75, temperature 97.8 F (36.6 C), temperature source Oral, resp. rate 21, height 6\' 4"  (1.93 m), weight 456 lb 11.2 oz (207.158 kg).    HEENT: Neck without mass Lymphatics: No cervical, supraclavicular, axillary, or inguinal nodes Resp: Lungs clear bilaterally Cardio: Distant heart sounds, regular rate and rhythm GI: No hepatomegaly, nontender, no mass, right abdominal wall hernia Vascular: Chronic stasis change with trace edema at the low leg bilaterally, support stockings in place   Lab Results:  CEA pending   Medications: I have reviewed the patient's current medications.  Assessment/Plan: 1. Stage III colon cancer diagnosed in January 2009 status post 8 cycles of adjuvant FOLFOX chemotherapy an 4 cycles of 5-FU/leucovorin. The last chemotherapy was given 09/17/2007. He remains in clinical remission. A restaging CT evaluation on 09/29/2010 revealed no evidence of recurrent colon cancer. 2. History of oxaliplatin neuropathy. 3. Status post Port-A-Cath placement 03/21/2007. The Port-A-Cath has been removed. 4. Microscopically detected peritoneal soft tissue metastatic focus at the time of the initial colon resection. No evidence for recurrent colon cancer on the CT in September 2012. 5. Indeterminate liver lesion and left iliac lymph node on a preoperative CT. A followup CT 04/11/2007 found the liver lesion to be stable. The indeterminate liver lesion was not seen on the CT 10/14/2007 and was felt to be a benign finding. The iliac lymph node was borderline enlarged on the January 2009 CT, slightly smaller on the March 2009  CT, and unchanged on a September 2009 study. This was felt to be a benign finding by the radiologist. 6. History of ventricular fibrillation status post AICD placement. 7. Atrial fibrillation. Maintained on Coumadin. 8. Right lower extremity deep vein thrombosis 08/09/2007. Treated with Arixtra. He is now maintained on Coumadin. 9. Diabetes. 10. Sleep apnea. 11. History of a skin rash. Resolved with Lamisil. 12. History of thrombocytopenia secondary to chemotherapy. 13. Complex left renal cyst increased in size on the CT 09/28/2008. A repeat CT with and without contrast 10/14/2008 revealed no convincing enhancement in the dominant bilateral cystic renal lesions. The lesions were present on scans dating to 2005 with a slow interval increase in size suggesting multiple cysts or indolent lesions. The left kidney lower pole cyst was reduced in size on the 10/04/2009 CT. 14. Morbid obesity. 15. Colonoscopy 07/02/2009 with findings of a 7 mL sessile polyp in the descending colon and nodular mucosa at the anastomosis. The pathology on the colon polyp showed an adenomatous polyp with no high-grade dysplasia or invasive malignancy identified. Pathology on the sigmoid anastomosis showed benign colonic mucosa.   Disposition:  Mr. Olden remains in clinical remission from colon cancer. We will followup on the CEA from today. He plans to continue clinical followup with Dr.Letvak. He will continue surveillance colonoscopies with Dr. Leone Payor.  He is not scheduled for a followup appointment at the [facility]. We will see him in the future as needed. He is now almost 6 years out from being diagnosed with colon cancer.   Thornton Papas, MD  11/26/2012  2:09 PM

## 2012-11-27 LAB — CEA: CEA: 1.7 ng/mL (ref 0.0–5.0)

## 2012-11-28 ENCOUNTER — Ambulatory Visit (INDEPENDENT_AMBULATORY_CARE_PROVIDER_SITE_OTHER): Payer: Medicare Other | Admitting: Family Medicine

## 2012-11-28 DIAGNOSIS — I82409 Acute embolism and thrombosis of unspecified deep veins of unspecified lower extremity: Secondary | ICD-10-CM

## 2012-11-28 DIAGNOSIS — Z7901 Long term (current) use of anticoagulants: Secondary | ICD-10-CM

## 2012-11-28 DIAGNOSIS — Z5181 Encounter for therapeutic drug level monitoring: Secondary | ICD-10-CM

## 2012-11-28 NOTE — Patient Instructions (Signed)
Skip Coumadin today (11/13) and then Continue to take 6 mg daily except 8 mg on Monday and Thursday.  Re-check in 6 weeks per pt request.

## 2012-11-28 NOTE — Progress Notes (Signed)
Should have a colon recall for 5 years after last one - so 2016

## 2012-12-05 ENCOUNTER — Encounter: Payer: Self-pay | Admitting: Internal Medicine

## 2012-12-05 ENCOUNTER — Telehealth: Payer: Self-pay

## 2012-12-05 NOTE — Telephone Encounter (Signed)
How long off of coumadin?  Do you want him seen by you or APP first?

## 2012-12-05 NOTE — Telephone Encounter (Signed)
3 days off warfarin ok APP visit ok

## 2012-12-05 NOTE — Telephone Encounter (Signed)
Dear  Para March:  We have scheduled the above patient for an endoscopic procedure. Our records show that  he/she is on anticoagulation therapy. Please advise as to how long the patient may come off their therapy of  coumadin prior to the scheduled procedure(s) .   Please fax back/or route the completed form to Darcey Nora, RN at [facility] Thank you for your help with this matter.  Sincerely,  Darcey Nora, RN  Physician Recommendation:  Hold Plavix 7 days prior ________________  Hold Coumadin 5 days prior ____________  Other ______________________________

## 2012-12-05 NOTE — Telephone Encounter (Signed)
Message copied by Annett Fabian on Thu Dec 05, 2012  1:38 PM ------      Message from: Stan Head E      Created: Thu Dec 05, 2012  1:27 PM                   ----- Message -----         From: Rossie Muskrat, RN         Sent: 11/28/2012   3:56 PM           To: Iva Boop, MD            There is not recall in the system.  Please advise      ----- Message -----         From: Iva Boop, MD         Sent: 11/28/2012   3:49 PM           To: Rossie Muskrat, RN                        ----- Message -----         From: Ladene Artist, MD         Sent: 11/26/2012   2:16 PM           To: Iva Boop, MD            Last colonoscopy was in 2011, when the plan for the next colonoscopy?            He was discharged from the oncology clinic today            Thanks,      Brad       ------

## 2012-12-05 NOTE — Telephone Encounter (Signed)
Appears to be answered by Dr. Leone Payor.  Thanks.

## 2012-12-05 NOTE — Progress Notes (Signed)
Lets have him schedule a repeat colonoscopy for hx polyps and colon cancer

## 2012-12-06 NOTE — Telephone Encounter (Signed)
Left message for patient to call back  

## 2012-12-09 NOTE — Telephone Encounter (Signed)
Patient is scheduled for a office visit with Doug Sou, PA 01/13/13 2:00 and colon [facility] 02/06/13 1:30

## 2013-01-06 ENCOUNTER — Ambulatory Visit (INDEPENDENT_AMBULATORY_CARE_PROVIDER_SITE_OTHER): Payer: Medicare Other | Admitting: Family Medicine

## 2013-01-06 DIAGNOSIS — Z5181 Encounter for therapeutic drug level monitoring: Secondary | ICD-10-CM

## 2013-01-06 DIAGNOSIS — Z7901 Long term (current) use of anticoagulants: Secondary | ICD-10-CM

## 2013-01-06 DIAGNOSIS — I82409 Acute embolism and thrombosis of unspecified deep veins of unspecified lower extremity: Secondary | ICD-10-CM

## 2013-01-10 ENCOUNTER — Other Ambulatory Visit: Payer: Self-pay | Admitting: Internal Medicine

## 2013-01-13 ENCOUNTER — Telehealth: Payer: Self-pay | Admitting: *Deleted

## 2013-01-13 ENCOUNTER — Other Ambulatory Visit: Payer: Self-pay | Admitting: *Deleted

## 2013-01-13 ENCOUNTER — Encounter: Payer: Self-pay | Admitting: Gastroenterology

## 2013-01-13 ENCOUNTER — Ambulatory Visit (INDEPENDENT_AMBULATORY_CARE_PROVIDER_SITE_OTHER): Payer: Medicare Other | Admitting: Gastroenterology

## 2013-01-13 VITALS — BP 134/68 | HR 68 | Ht 76.0 in | Wt >= 6400 oz

## 2013-01-13 DIAGNOSIS — Z860100 Personal history of colon polyps, unspecified: Secondary | ICD-10-CM | POA: Insufficient documentation

## 2013-01-13 DIAGNOSIS — Z85038 Personal history of other malignant neoplasm of large intestine: Secondary | ICD-10-CM

## 2013-01-13 NOTE — Progress Notes (Signed)
01/13/2013 Craig Lindsey [id number] [birth year]   HISTORY OF PRESENT ILLNESS:  This is a pleasant 52 year old male who is know to Dr. Leone Payor for previous colonoscopies.  He was diagnosed with Stage 3 colon cancer in 01/2007 and underwent surgery and chemotherapy.  Was recently released from oncology.  Last colonoscopy was in 06/2009 at which time he was found to have one polyp and nodular anastomosis.  The polyp was a TA and the biopsies from the nodular anastomosis showed benign mucosa with multiple lymphoid aggregates.  Patient comes in today to schedule surveillance colonoscopy.  He denies any complaints.  He is on coumadin and has been on that for 10 years or so.       Past Medical History  Diagnosis Date  . Diabetes mellitus     Type II  . Hyperlipidemia   . Hypertension   . Arrhythmia 12/04    1. Ventricular tachycardia; Dr. Ladona Ridgel  2. Atrial fibrillation  . Sleep apnea   . Obesity   . Hemorrhoids   . Personal history of colon cancer, stage III 1/09    Dr. Leone Payor, Truett Perna, D. Newman  . Neuropathy     Oxaliplatin  . DVT (deep venous thrombosis) 7/09    RLE, Tx Arixtra  . Chronic kidney disease     Complex L renal cyst  . Sigmoid colon ulcer     Sigmoid; Raelyn Mora, MD (colon cancer)   Past Surgical History  Procedure Laterality Date  . Cardiac defibrillator placement    . Colectomy  1/09    Sigmoid; Raelyn Mora, MD (colon cancer)  . Cholecystectomy    . Hernia repair      umb hernia  . Colonoscopy      reports that he has never smoked. He has never used smokeless tobacco. He reports that he does not drink alcohol or use illicit drugs. family history includes Cancer in his maternal aunt; Colon cancer in his other; Diabetes in his father and maternal grandmother. There is no history of Hypertension, Coronary artery disease, or Prostate cancer. No Known Allergies    Outpatient Encounter Prescriptions as of 01/13/2013  Medication Sig  . amiodarone (PACERONE) 200 MG  tablet Take 1 tablet (200 mg total) by mouth daily.  Marland Kitchen atorvastatin (LIPITOR) 10 MG tablet TAKE 1 TABLET (10 MG TOTAL) BY MOUTH DAILY.  . BD ULTRA-FINE PEN NEEDLES 29G X 12.7MM MISC USE WITH INSULIN PEN AS DIRECTED  . diltiazem (DILACOR XR) 240 MG 24 hr capsule TAKE ONE CAPSULE BY MOUTH EVERY DAY  . furosemide (LASIX) 40 MG tablet Take 1 tablet (40 mg total) by mouth daily.  Marland Kitchen glipiZIDE (GLUCOTROL XL) 10 MG 24 hr tablet TAKE 1 TABLET (10 MG TOTAL) BY MOUTH DAILY.  Marland Kitchen insulin glargine (LANTUS) 100 UNIT/ML injection Inject 0.27 mLs (27 Units total) into the skin at bedtime.  . Insulin Pen Needle 29G X 12.7MM MISC Inject 20 Units into the muscle daily.    Marland Kitchen lisinopril (PRINIVIL,ZESTRIL) 40 MG tablet TAKE 1 TABLET (40 MG TOTAL) BY MOUTH DAILY.  . metFORMIN (GLUCOPHAGE) 1000 MG tablet Take 1 tablet (1,000 mg total) by mouth 2 (two) times daily.  . metoprolol succinate (TOPROL-XL) 100 MG 24 hr tablet TAKE 1 & 1/2 TABLETS BY MOUTH EVERY DAY  . multivitamin (THERAGRAN) per tablet Take 1 tablet by mouth daily.    . NON FORMULARY Knee high compression hose 20-30 mmhg. Apply daily and remove at bedtime.   . sitaGLIPtin (JANUVIA) 100 MG tablet  Take 1 tablet (100 mg total) by mouth daily.  Marland Kitchen spironolactone (ALDACTONE) 50 MG tablet Take 1 tablet (50 mg total) by mouth 2 (two) times daily.  Marland Kitchen warfarin (COUMADIN) 4 MG tablet take 6 mg daily except 8 mg on Monday and Thursday     REVIEW OF SYSTEMS  : All other systems reviewed and negative except where noted in the History of Present Illness.   PHYSICAL EXAM: BP 134/68  Pulse 68  Ht 6\' 4"  (1.93 m)  Wt 458 lb 6.4 oz (207.929 kg)  BMI 55.82 kg/m2 General: Well developed white male in no acute distress Head: Normocephalic and atraumatic Eyes:  Sclerae anicteric, conjunctiva pink. Ears: Normal auditory acuity. Lungs: Clear throughout to auscultation Heart: Regular rate and rhythm.  Distant heart sounds.   Abdomen: Soft, obese.  Non-tender.  BS present.      Rectal: Deferred.  Will be done at the time of colonoscopy. Musculoskeletal: Symmetrical with no gross deformities  Skin: No lesions on visible extremities Extremities:  Has TED hose on LE's for edema. Neurological: Alert oriented x 4, grossly non-focal. Psychological:  Alert and cooperative. Normal mood and affect  ASSESSMENT AND PLAN: -Personal history of colon polyps and colon cancer:  Will schedule for surveillance colonoscopy.  The risks, benefits, and alternatives were discussed with the patient and he consents to proceed.  The risks benefits and alternatives to a temporary hold of anti-coagulants/anti-platelets for the procedure were discussed with the patient he consents to proceed. Obtain clearance from Dr. Ladona Ridgel.

## 2013-01-13 NOTE — Telephone Encounter (Signed)
  01/13/2013   RE: Craig Lindsey DOB: 1960-12-18 MRN: [id number]   Dear Dr. Sharrell Ku,    We have scheduled the above patient for an endoscopic procedure. Our records show that he is on anticoagulation therapy.   Please advise as to how long the patient may come off his therapy of Coumadin prior to the procedure, which is scheduled for 02-25-2013.  Please fax back/ or route the completed form to [facility] CMA at [phone].   Sincerely,    Doug Sou PA-C

## 2013-01-13 NOTE — Patient Instructions (Signed)
You have been scheduled for a colonoscopy with propofol. Please follow written instructions given to you at your visit today.  We have given you a sample Suprep.  If you use inhalers (even only as needed), please bring them with you on the day of your procedure. Your physician has requested that you go to www.startemmi.com and enter the access code given to you at your visit today. This web site gives a general overview about your procedure. However, you should still follow specific instructions given to you by our office regarding your preparation for the procedure.

## 2013-01-15 NOTE — Progress Notes (Signed)
Agree w/ Craig Lindsey's plans.  

## 2013-01-15 NOTE — Telephone Encounter (Signed)
May stop coumadin 4 days prior to procedure. GT

## 2013-01-20 NOTE — Telephone Encounter (Signed)
Called the patient to advise we heard back from Dr. [name] regarding the coumadin. He is to stop the coumadin on 02-21-2013 and resume it on 02-26-2013.  The patient understoods the directions.

## 2013-01-22 ENCOUNTER — Other Ambulatory Visit: Payer: Self-pay | Admitting: Internal Medicine

## 2013-01-23 ENCOUNTER — Encounter: Payer: Self-pay | Admitting: *Deleted

## 2013-02-03 ENCOUNTER — Other Ambulatory Visit: Payer: Self-pay | Admitting: *Deleted

## 2013-02-03 MED ORDER — METFORMIN HCL 1000 MG PO TABS: 1000.0000 mg | ORAL_TABLET | Freq: Two times a day (BID) | ORAL | Status: DC

## 2013-02-06 ENCOUNTER — Encounter: Payer: Commercial Managed Care - HMO | Admitting: Internal Medicine

## 2013-02-12 ENCOUNTER — Encounter (HOSPITAL_COMMUNITY): Payer: Self-pay | Admitting: Pharmacy Technician

## 2013-02-12 ENCOUNTER — Encounter (HOSPITAL_COMMUNITY): Payer: Self-pay | Admitting: *Deleted

## 2013-02-17 ENCOUNTER — Ambulatory Visit: Payer: Medicare Other

## 2013-02-20 ENCOUNTER — Other Ambulatory Visit: Payer: Self-pay | Admitting: Family Medicine

## 2013-02-20 ENCOUNTER — Ambulatory Visit (INDEPENDENT_AMBULATORY_CARE_PROVIDER_SITE_OTHER): Payer: Medicare HMO | Admitting: Family Medicine

## 2013-02-20 DIAGNOSIS — I82409 Acute embolism and thrombosis of unspecified deep veins of unspecified lower extremity: Secondary | ICD-10-CM

## 2013-02-20 DIAGNOSIS — Z5181 Encounter for therapeutic drug level monitoring: Secondary | ICD-10-CM

## 2013-02-20 DIAGNOSIS — Z7901 Long term (current) use of anticoagulants: Secondary | ICD-10-CM

## 2013-02-20 LAB — POCT INR: INR: 4.1

## 2013-02-20 MED ORDER — WARFARIN SODIUM 6 MG PO TABS: 6.0000 mg | ORAL_TABLET | Freq: Every day | ORAL | Status: DC

## 2013-02-24 ENCOUNTER — Other Ambulatory Visit: Payer: Self-pay

## 2013-02-24 DIAGNOSIS — Z85038 Personal history of other malignant neoplasm of large intestine: Secondary | ICD-10-CM

## 2013-02-24 DIAGNOSIS — Z86010 Personal history of colonic polyps: Secondary | ICD-10-CM

## 2013-02-25 ENCOUNTER — Ambulatory Visit (HOSPITAL_COMMUNITY): Payer: Medicare HMO | Admitting: Anesthesiology

## 2013-02-25 ENCOUNTER — Encounter (HOSPITAL_COMMUNITY): Payer: Medicare HMO | Admitting: Anesthesiology

## 2013-02-25 ENCOUNTER — Encounter (HOSPITAL_COMMUNITY): Payer: Self-pay | Admitting: *Deleted

## 2013-02-25 ENCOUNTER — Ambulatory Visit (HOSPITAL_COMMUNITY)
Admission: RE | Admit: 2013-02-25 | Discharge: 2013-02-25 | Disposition: A | Discharge status: Home / Self Care | Payer: Medicare HMO | Source: Ambulatory Visit | Attending: Internal Medicine | Admitting: Internal Medicine

## 2013-02-25 ENCOUNTER — Encounter (HOSPITAL_COMMUNITY)
Admission: RE | Disposition: A | Discharge status: Home / Self Care | Payer: Commercial Managed Care - HMO | Source: Ambulatory Visit | Attending: Internal Medicine

## 2013-02-25 DIAGNOSIS — Z85038 Personal history of other malignant neoplasm of large intestine: Secondary | ICD-10-CM

## 2013-02-25 DIAGNOSIS — Z86718 Personal history of other venous thrombosis and embolism: Secondary | ICD-10-CM | POA: Insufficient documentation

## 2013-02-25 DIAGNOSIS — Z1211 Encounter for screening for malignant neoplasm of colon: Secondary | ICD-10-CM | POA: Insufficient documentation

## 2013-02-25 DIAGNOSIS — E669 Obesity, unspecified: Secondary | ICD-10-CM | POA: Insufficient documentation

## 2013-02-25 DIAGNOSIS — I10 Essential (primary) hypertension: Secondary | ICD-10-CM | POA: Insufficient documentation

## 2013-02-25 DIAGNOSIS — Z794 Long term (current) use of insulin: Secondary | ICD-10-CM | POA: Insufficient documentation

## 2013-02-25 DIAGNOSIS — Z860100 Personal history of colon polyps, unspecified: Secondary | ICD-10-CM | POA: Insufficient documentation

## 2013-02-25 DIAGNOSIS — E119 Type 2 diabetes mellitus without complications: Secondary | ICD-10-CM | POA: Insufficient documentation

## 2013-02-25 DIAGNOSIS — G4730 Sleep apnea, unspecified: Secondary | ICD-10-CM | POA: Insufficient documentation

## 2013-02-25 DIAGNOSIS — Z9049 Acquired absence of other specified parts of digestive tract: Secondary | ICD-10-CM | POA: Insufficient documentation

## 2013-02-25 DIAGNOSIS — I4891 Unspecified atrial fibrillation: Secondary | ICD-10-CM | POA: Insufficient documentation

## 2013-02-25 DIAGNOSIS — D129 Benign neoplasm of anus and anal canal: Secondary | ICD-10-CM

## 2013-02-25 DIAGNOSIS — Z9089 Acquired absence of other organs: Secondary | ICD-10-CM | POA: Insufficient documentation

## 2013-02-25 DIAGNOSIS — E785 Hyperlipidemia, unspecified: Secondary | ICD-10-CM | POA: Insufficient documentation

## 2013-02-25 DIAGNOSIS — Z79899 Other long term (current) drug therapy: Secondary | ICD-10-CM | POA: Insufficient documentation

## 2013-02-25 DIAGNOSIS — D128 Benign neoplasm of rectum: Secondary | ICD-10-CM | POA: Diagnosis present

## 2013-02-25 DIAGNOSIS — Z7901 Long term (current) use of anticoagulants: Secondary | ICD-10-CM | POA: Insufficient documentation

## 2013-02-25 DIAGNOSIS — K922 Gastrointestinal hemorrhage, unspecified: Secondary | ICD-10-CM | POA: Insufficient documentation

## 2013-02-25 DIAGNOSIS — K5730 Diverticulosis of large intestine without perforation or abscess without bleeding: Secondary | ICD-10-CM | POA: Insufficient documentation

## 2013-02-25 DIAGNOSIS — Z95810 Presence of automatic (implantable) cardiac defibrillator: Secondary | ICD-10-CM | POA: Insufficient documentation

## 2013-02-25 HISTORY — PX: COLONOSCOPY: SHX5424

## 2013-02-25 HISTORY — DX: Umbilical hernia without obstruction or gangrene: K42.9

## 2013-02-25 LAB — BASIC METABOLIC PANEL
BUN: 32 mg/dL — AB (ref 6–23)
CALCIUM: 9.1 mg/dL (ref 8.4–10.5)
CO2: 20 mEq/L (ref 19–32)
Chloride: 100 mEq/L (ref 96–112)
Creatinine, Ser: 2.02 mg/dL — ABNORMAL HIGH (ref 0.50–1.35)
GFR calc Af Amer: 42 mL/min — ABNORMAL LOW (ref >90)
GFR calc non Af Amer: 36 mL/min — ABNORMAL LOW (ref >90)
GLUCOSE: 218 mg/dL — AB (ref 70–99)
Potassium: 5.9 mEq/L — ABNORMAL HIGH (ref 3.7–5.3)
Sodium: 136 mEq/L — ABNORMAL LOW (ref 137–147)

## 2013-02-25 LAB — GLUCOSE, CAPILLARY: Glucose-Capillary: 196 mg/dL — ABNORMAL HIGH (ref 70–99)

## 2013-02-25 SURGERY — COLONOSCOPY
Anesthesia: Monitor Anesthesia Care

## 2013-02-25 MED ORDER — KETAMINE HCL 10 MG/ML IJ SOLN
INTRAMUSCULAR | Status: DC | PRN
  Administered 2013-02-25: 10 mg via INTRAVENOUS
  Administered 2013-02-25: 25 mg via INTRAVENOUS
  Administered 2013-02-25: 15 mg via INTRAVENOUS

## 2013-02-25 MED ORDER — PROPOFOL 10 MG/ML IV BOLUS
INTRAVENOUS | Status: AC
  Filled 2013-02-25: qty 20

## 2013-02-25 MED ORDER — SODIUM CHLORIDE 0.9 % IV SOLN: INTRAVENOUS | Status: DC

## 2013-02-25 MED ORDER — PROPOFOL 10 MG/ML IV BOLUS
INTRAVENOUS | Status: DC | PRN
  Administered 2013-02-25 (×2): 20 mg via INTRAVENOUS

## 2013-02-25 MED ORDER — PHENYLEPHRINE HCL 10 MG/ML IJ SOLN
INTRAMUSCULAR | Status: DC | PRN
  Administered 2013-02-25: 40 ug via INTRAVENOUS

## 2013-02-25 MED ORDER — LACTATED RINGERS IV SOLN
INTRAVENOUS | Status: DC
  Administered 2013-02-25: 1000 mL via INTRAVENOUS

## 2013-02-25 MED ORDER — PROPOFOL INFUSION 10 MG/ML OPTIME
INTRAVENOUS | Status: DC | PRN
  Administered 2013-02-25: 140 ug/kg/min via INTRAVENOUS

## 2013-02-25 MED ORDER — PHENYLEPHRINE 40 MCG/ML (10ML) SYRINGE FOR IV PUSH (FOR BLOOD PRESSURE SUPPORT)
PREFILLED_SYRINGE | INTRAVENOUS | Status: AC
  Filled 2013-02-25: qty 10

## 2013-02-25 NOTE — Discharge Instructions (Addendum)
I found and removed one tiny polyp and took biopsies where the colon cancer was removed - nothing concerning just checking.  I will let you know pathology results and when to have another routine colonoscopy by mail.  Go ahead and restart your warfarin tonight and have INR checked by next week please.  I appreciate the opportunity to care for you. Gatha Mayer, MD, FACG  YOU HAD AN ENDOSCOPIC PROCEDURE TODAY: Refer to the procedure report and other information in the discharge instructions given to you for any specific questions about what was found during the examination. If this information does not answer your questions, please call Dr. Celesta Aver office at [phone] to clarify.   YOU SHOULD EXPECT: Some feelings of bloating in the abdomen. Passage of more gas than usual. Walking can help get rid of the air that was put into your GI tract during the procedure and reduce the bloating. If you had a lower endoscopy (such as a colonoscopy or flexible sigmoidoscopy) you may notice spotting of blood in your stool or on the toilet paper. Some abdominal soreness may be present for a day or two, also.  DIET: Your first meal following the procedure should be a light meal and then it is ok to progress to your normal diet. A half-sandwich or bowl of soup is an example of a good first meal. Heavy or fried foods are harder to digest and may make you feel nauseous or bloated. Drink plenty of fluids but you should avoid alcoholic beverages for 24 hours.   ACTIVITY: Your care partner should take you home directly after the procedure. You should plan to take it easy, moving slowly for the rest of the day. You can resume normal activity the day after the procedure however YOU SHOULD NOT DRIVE, use power tools, machinery or perform tasks that involve climbing or major physical exertion for 24 hours (because of the sedation medicines used during the test).   SYMPTOMS TO REPORT IMMEDIATELY: A gastroenterologist  can be reached at any hour. Please call [phone]  for any of the following symptoms:  Following lower endoscopy (colonoscopy, flexible sigmoidoscopy) Excessive amounts of blood in the stool  Significant tenderness, worsening of abdominal pains  Swelling of the abdomen that is new, acute  Fever of 100 or higher   FOLLOW UP:  If any biopsies were taken you will be contacted by phone or by letter within the next 1-3 weeks. Call [phone]  if you have not heard about the biopsies in 3 weeks.  Please also call with any specific questions about appointments or follow up tests.  Monitored Anesthesia Care  Monitored anesthesia care is an anesthesia service for a medical procedure. Anesthesia is the loss of the ability to feel pain. It is produced by medications called anesthetics. It may affect a small area of your body (local anesthesia), a large area of your body (regional anesthesia), or your entire body (general anesthesia). The need for monitored anesthesia care depends your procedure, your condition, and the potential need for regional or general anesthesia. It is often provided during procedures where:   General anesthesia may be needed if there are complications. This is because you need special care when you are under general anesthesia.   You will be under local or regional anesthesia. This is so that you are able to have higher levels of anesthesia if needed.   You will receive calming medications (sedatives). This is especially the case if sedatives are given to put  you in a semi-conscious state of relaxation (deep sedation). This is because the amount of sedative needed to produce this state can be hard to predict. Too much of a sedative can produce general anesthesia. Monitored anesthesia care is performed by one or more caregivers who have special training in all types of anesthesia. You will need to meet with these caregivers before your procedure. During this meeting, they will  ask you about your medical history. They will also give you instructions to follow. (For example, you will need to stop eating and drinking before your procedure. You may also need to stop or change medications you are taking.) During your procedure, your caregivers will stay with you. They will:   Watch your condition. This includes watching you blood pressure, breathing, and level of pain.   Diagnose and treat problems that occur.   Give medications if they are needed. These may include calming medications (sedatives) and anesthetics.   Make sure you are comfortable.  Having monitored anesthesia care does not necessarily mean that you will be under anesthesia. It does mean that your caregivers will be able to manage anesthesia if you need it or if it occurs. It also means that you will be able to have a different type of anesthesia than you are having if you need it. When your procedure is complete, your caregivers will continue to watch your condition. They will make sure any medications wear off before you are allowed to go home.  Document Released: 09/28/2004 Document Revised: 04/29/2012 Document Reviewed: 02/14/2012 [facility] Patient Information 2014 [place], [place].

## 2013-02-25 NOTE — Transfer of Care (Signed)
Immediate Anesthesia Transfer of Care Note  Patient: Craig Lindsey  Procedure(s) Performed: Procedure(s) (LRB): COLONOSCOPY (N/A)  Patient Location: PACU  Anesthesia Type: MAC  Level of Consciousness: sedated, patient cooperative and responds to stimulation  Airway & Oxygen Therapy: Patient Spontanous Breathing and Patient connected to face mask oxgen  Post-op Assessment: Report given to PACU RN and Post -op Vital signs reviewed and stable  Post vital signs: Reviewed and stable  Complications: No apparent anesthesia complications

## 2013-02-25 NOTE — Op Note (Signed)
[facility] [place], [address]   COLONOSCOPY PROCEDURE REPORT  PATIENT: Craig, Lindsey  MR#: [id number] BIRTHDATE: 05-Dec-1960 , 1  yrs. old GENDER: Male ENDOSCOPIST: Gatha Mayer, MD, [facility] PROCEDURE DATE:  02/25/2013 PROCEDURE:   Colonoscopy with biopsy and snare polypectomy First Screening Colonoscopy - Avg.  risk and is 50 yrs.  old or older - No.  Prior Negative Screening - Now for repeat screening. N/A  History of Adenoma - Now for follow-up colonoscopy & has been > or = to 3 yrs.  Yes hx of adenoma.  Has been 3 or more years since last colonoscopy.  Polyps Removed Today? Yes. ASA CLASS:   Class III INDICATIONS:High risk patient with personal history of colon cancer and Patient's personal history of adenomatous colon polyps. MEDICATIONS: See Anesthesia Report and MAC sedation, administered by CRNA  DESCRIPTION OF PROCEDURE:   After the risks benefits and alternatives of the procedure were thoroughly explained, informed consent was obtained.  A digital rectal exam revealed no rectal mass.   The Pentax Colonoscope [id number]  endoscope was introduced through the anus and advanced to the cecum, which was identified by both the appendix and ileocecal valve. No adverse events experienced.   The quality of the prep was Suprep good  The instrument was then slowly withdrawn as the colon was fully examined.  COLON FINDINGS: A diminutive sessile polyp was found in the rectum. A polypectomy was performed with a cold snare.  The resection was complete and the polyp tissue was completely retrieved.   There was evidence of a prior end-to-end colo-colonic surgical anastomosis in the sigmoid colon.  Multiple biopsies were performed of nodular/polypoid changes.    There was mild scattered diverticulosis noted in the left colon.   The colon mucosa was otherwise normal.  Retroflexed views revealed no abnormalities. The time to cecum=4 minutes 0 seconds.   Withdrawal time=12 minutes 0 seconds.  The scope was withdrawn and the procedure completed. COMPLICATIONS: There were no complications.  ENDOSCOPIC IMPRESSION: 1.   Diminutive sessile polyp was found in the rectum; polypectomy was performed with a cold snare 2.   There was evidence of a prior colo-colonic surgical anastomosis in the sigmoid colon; multiple biopsies were performed 3.   There was mild diverticulosis noted in the left colon 4.   The colon mucosa was otherwise normal  RECOMMENDATIONS: 1.  Timing of repeat colonoscopy will be determined by pathology findings. 2.   Resume warfarin tonight   eSigned:  Gatha Mayer, MD, [facility] 02/25/2013 11:16 AM   cc: The Patient

## 2013-02-25 NOTE — H&P (Signed)
[facility] History and Physical   Primary Care Physician:  Viviana Simpler, MD   Reason for Procedure:  Follow-up after colon cancer and colon polyps  Plan:    Colonoscopy - The risks and benefits as well as alternatives of endoscopic procedure(s) have been discussed and reviewed. All questions answered. The patient agrees to proceed.      HPI: Abimael Zeiter is a 53 y.o. male with a prior hx of colon cancer and polyps. Here for a routine follow-up colonoscopy. Warfarin has been held.   Past Medical History  Diagnosis Date  . Diabetes mellitus     Type II  . Hyperlipidemia   . Hypertension   . Arrhythmia 12/04    1. Ventricular tachycardia; Dr. [name]  2. Atrial fibrillation  . Obesity   . Hemorrhoids     hx of  . Personal history of colon cancer, stage III 1/09    Dr. Carron Brazen, D. Newman  . Neuropathy     Oxaliplatin  . DVT (deep venous thrombosis) 7/09    RLE, Tx Arixtra  . Sigmoid colon ulcer 5 yrs ago    Sigmoid; Nydia Bouton, MD (colon cancer)  . Sleep apnea     uses cpap setting of 10  . Chronic kidney disease     Complex L renal cyst, no tx for   . Umbilical hernia     Past Surgical History  Procedure Laterality Date  . Cardiac defibrillator placement    . Colectomy  1/09    Sigmoid; Nydia Bouton, MD (colon cancer)  . Cholecystectomy    . Colonoscopy    . Hernia repair  yrs ago    umb hernia    Prior to Admission medications   Medication Sig Start Date End Date Taking? Authorizing Provider  amiodarone (PACERONE) 200 MG tablet Take 200 mg by mouth every morning.   Yes Historical Provider, MD  atorvastatin (LIPITOR) 10 MG tablet Take 10 mg by mouth every morning.   Yes Historical Provider, MD  diltiazem (DILACOR XR) 240 MG 24 hr capsule Take 240 mg by mouth every morning.   Yes Historical Provider, MD  furosemide (LASIX) 40 MG tablet Take 40 mg by mouth every morning.   Yes Historical Provider, MD  glipiZIDE (GLUCOTROL XL) 10 MG 24 hr  tablet Take 10 mg by mouth daily with breakfast.   Yes Historical Provider, MD  insulin glargine (LANTUS) 100 UNIT/ML injection Inject 28 Units into the skin at bedtime.   Yes Historical Provider, MD  lisinopril (PRINIVIL,ZESTRIL) 40 MG tablet Take 40 mg by mouth every morning.   Yes Historical Provider, MD  metFORMIN (GLUCOPHAGE) 1000 MG tablet Take 1,000 mg by mouth 2 (two) times daily with a meal.   Yes Historical Provider, MD  metoprolol succinate (TOPROL-XL) 100 MG 24 hr tablet Take 150 mg by mouth every morning. Take with or immediately following a meal.   Yes Historical Provider, MD  multivitamin [facility]) per tablet Take 1 tablet by mouth daily.     Yes Historical Provider, MD  NON FORMULARY Knee high compression hose 20-30 mmhg. Apply daily and remove at bedtime.    Yes Historical Provider, MD  sitaGLIPtin (JANUVIA) 100 MG tablet Take 100 mg by mouth every morning.   Yes Historical Provider, MD  spironolactone (ALDACTONE) 50 MG tablet Take 50 mg by mouth 2 (two) times daily.   Yes Historical Provider, MD  warfarin (COUMADIN) 4 MG tablet Take 6-8 mg by mouth daily. 6 mg every day  but on mondays 8 mg   Yes Historical Provider, MD  warfarin (COUMADIN) 6 MG tablet Take 1 tablet (6 mg total) by mouth daily. 02/20/13  Yes Venia Carbon, MD  BD ULTRA-FINE PEN NEEDLES 29G X 12.7MM MISC USE WITH INSULIN PEN AS DIRECTED 02/09/12   Venia Carbon, MD  Insulin Pen Needle 29G X 12.7MM MISC Inject 20 Units into the muscle daily.      Historical Provider, MD    Current Facility-Administered Medications  Medication Dose Route Frequency Provider Last Rate Last Dose  . 0.9 %  sodium chloride infusion   Intravenous Continuous Gatha Mayer, MD        Allergies as of 01/13/2013  . (No Known Allergies)    Family History  Problem Relation Age of Onset  . Diabetes Father   . Diabetes Maternal Grandmother   . Hypertension Neg Hx   . Coronary artery disease Neg Hx   . Prostate cancer Neg Hx   .  Colon cancer Other   . Cancer Maternal Aunt     colon    History   Social History  . Marital Status: Married    Spouse Name: N/A    Number of Children: 2  . Years of Education: N/A   Occupational History  . [employer]     Disabled   Social History Main Topics  . Smoking status: Never Smoker   . Smokeless tobacco: Never Used  . Alcohol Use: No  . Drug Use: No  . Sexual Activity: Yes   Other Topics Concern  . Not on file   Social History Narrative   2 step children from wife's 2nd marriage.    Review of Systems: All other review of systems negative except as mentioned in the HPI.  Physical Exam: Vital signs in last 24 hours: Temp:  [97.6 F (36.4 C)] 97.6 F (36.4 C) (02/10 0928) Resp:  [22] 22 (02/10 0928) BP: (138)/(63) 138/63 mmHg (02/10 0928) SpO2:  [95 %] 95 % (02/10 0928) Weight:  [450 lb (204.119 kg)] 450 lb (204.119 kg) (02/10 0928)   General:   Alert,  Well-developed, well-nourished, pleasant and cooperative in NAD Lungs:  Clear throughout to auscultation.   Heart:  Regular rate and rhythm; no murmurs, clicks, rubs,  or gallops. Abdomen:  Soft, nontender and nondistended. Normal bowel sounds.   Neuro/Psych:  Alert and cooperative. Normal mood and affect. A and O x 3   @Icelyn Navarrete  Simonne Maffucci, MD, [facility] [phone] (pager) 02/25/2013 9:39 AM@

## 2013-02-25 NOTE — Anesthesia Postprocedure Evaluation (Signed)
  Anesthesia Post-op Note  Patient: Craig Lindsey  Procedure(s) Performed: Procedure(s) (LRB): COLONOSCOPY (N/A)  Patient Location: PACU  Anesthesia Type: MAC  Level of Consciousness: awake and alert   Airway and Oxygen Therapy: Patient Spontanous Breathing  Post-op Pain: mild  Post-op Assessment: Post-op Vital signs reviewed, Patient's Cardiovascular Status Stable, Respiratory Function Stable, Patent Airway and No signs of Nausea or vomiting  Last Vitals:  Filed Vitals:   02/25/13 1130  BP: 105/60  Pulse:   Temp:   Resp: 17    Post-op Vital Signs: stable   Complications: No apparent anesthesia complications

## 2013-02-25 NOTE — Anesthesia Preprocedure Evaluation (Addendum)
Anesthesia Evaluation  Patient identified by MRN, date of birth, ID band Patient awake    Reviewed: Allergy & Precautions, H&P , NPO status , Patient's Chart, lab work & pertinent test results  Airway Mallampati: III TM Distance: >3 FB Neck ROM: full    Dental no notable dental hx. (+) Teeth Intact and Dental Advisory Given   Pulmonary neg pulmonary ROS, sleep apnea and Continuous Positive Airway Pressure Ventilation ,  breath sounds clear to auscultation  Pulmonary exam normal       Cardiovascular Exercise Tolerance: Good hypertension, Pt. on medications negative cardio ROS  + dysrhythmias Atrial Fibrillation and Ventricular Tachycardia Rhythm:regular Rate:Normal     Neuro/Psych negative neurological ROS  negative psych ROS   GI/Hepatic negative GI ROS, Neg liver ROS,   Endo/Other  negative endocrine ROSdiabetes, Well Controlled, Type 2, Oral Hypoglycemic AgentsMorbid obesity  Renal/GU negative Renal ROS  negative genitourinary   Musculoskeletal   Abdominal (+) + obese,   Peds  Hematology negative hematology ROS (+)   Anesthesia Other Findings   Reproductive/Obstetrics negative OB ROS                          Anesthesia Physical Anesthesia Plan  ASA: III  Anesthesia Plan: MAC   Post-op Pain Management:    Induction:   Airway Management Planned: Nasal Cannula  Additional Equipment:   Intra-op Plan:   Post-operative Plan:   Informed Consent: I have reviewed the patients History and Physical, chart, labs and discussed the procedure including the risks, benefits and alternatives for the proposed anesthesia with the patient or authorized representative who has indicated his/her understanding and acceptance.   Dental Advisory Given  Plan Discussed with: CRNA and Surgeon  Anesthesia Plan Comments:         Anesthesia Quick Evaluation

## 2013-02-27 ENCOUNTER — Encounter: Payer: Self-pay | Admitting: Internal Medicine

## 2013-02-27 ENCOUNTER — Encounter (HOSPITAL_COMMUNITY): Payer: Self-pay | Admitting: Internal Medicine

## 2013-02-27 NOTE — Progress Notes (Signed)
Quick Note:  Diminutive rectal adenoma Negative anastomosis bxs Repeat colon 2020 ______

## 2013-03-10 ENCOUNTER — Ambulatory Visit (INDEPENDENT_AMBULATORY_CARE_PROVIDER_SITE_OTHER): Payer: Medicare HMO | Admitting: Internal Medicine

## 2013-03-10 ENCOUNTER — Encounter: Payer: Self-pay | Admitting: Internal Medicine

## 2013-03-10 ENCOUNTER — Other Ambulatory Visit: Payer: Self-pay | Admitting: Internal Medicine

## 2013-03-10 VITALS — BP 110/60 | HR 77 | Temp 98.3°F | Ht 76.0 in | Wt >= 6400 oz

## 2013-03-10 DIAGNOSIS — E1142 Type 2 diabetes mellitus with diabetic polyneuropathy: Secondary | ICD-10-CM

## 2013-03-10 DIAGNOSIS — E1149 Type 2 diabetes mellitus with other diabetic neurological complication: Secondary | ICD-10-CM

## 2013-03-10 DIAGNOSIS — Z0000 Encounter for general adult medical examination without abnormal findings: Secondary | ICD-10-CM

## 2013-03-10 DIAGNOSIS — I4891 Unspecified atrial fibrillation: Secondary | ICD-10-CM

## 2013-03-10 DIAGNOSIS — I10 Essential (primary) hypertension: Secondary | ICD-10-CM

## 2013-03-10 DIAGNOSIS — Z6841 Body Mass Index (BMI) 40.0 and over, adult: Secondary | ICD-10-CM

## 2013-03-10 LAB — COMPREHENSIVE METABOLIC PANEL
ALBUMIN: 3.8 g/dL (ref 3.5–5.2)
ALT: 75 U/L — AB (ref 0–53)
AST: 44 U/L — AB (ref 0–37)
Alkaline Phosphatase: 50 U/L (ref 39–117)
BUN: 37 mg/dL — ABNORMAL HIGH (ref 6–23)
CALCIUM: 9.5 mg/dL (ref 8.4–10.5)
CHLORIDE: 106 meq/L (ref 96–112)
CO2: 21 mEq/L (ref 19–32)
Creatinine, Ser: 2 mg/dL — ABNORMAL HIGH (ref 0.4–1.5)
GFR: 36.51 mL/min — ABNORMAL LOW (ref >60.00)
Glucose, Bld: 160 mg/dL — ABNORMAL HIGH (ref 70–99)
POTASSIUM: 5.2 meq/L — AB (ref 3.5–5.1)
SODIUM: 138 meq/L (ref 135–145)
Total Bilirubin: 0.4 mg/dL (ref 0.3–1.2)
Total Protein: 7.3 g/dL (ref 6.0–8.3)

## 2013-03-10 LAB — LIPID PANEL
Cholesterol: 140 mg/dL (ref 0–200)
HDL: 36.4 mg/dL — ABNORMAL LOW (ref >39.00)
Total CHOL/HDL Ratio: 4
Triglycerides: 275 mg/dL — ABNORMAL HIGH (ref 0.0–149.0)
VLDL: 55 mg/dL — ABNORMAL HIGH (ref 0.0–40.0)

## 2013-03-10 LAB — CBC WITH DIFFERENTIAL/PLATELET
Basophils Absolute: 0 10*3/uL (ref 0.0–0.1)
Basophils Relative: 0.4 % (ref 0.0–3.0)
EOS ABS: 0.3 10*3/uL (ref 0.0–0.7)
Eosinophils Relative: 3 % (ref 0.0–5.0)
HCT: 43.2 % (ref 39.0–52.0)
Hemoglobin: 13.9 g/dL (ref 13.0–17.0)
Lymphocytes Relative: 20.5 % (ref 12.0–46.0)
Lymphs Abs: 2.2 10*3/uL (ref 0.7–4.0)
MCHC: 32.2 g/dL (ref 30.0–36.0)
MCV: 96.2 fl (ref 78.0–100.0)
MONO ABS: 0.8 10*3/uL (ref 0.1–1.0)
Monocytes Relative: 7.6 % (ref 3.0–12.0)
NEUTROS ABS: 7.3 10*3/uL (ref 1.4–7.7)
NEUTROS PCT: 68.5 % (ref 43.0–77.0)
Platelets: 186 10*3/uL (ref 150.0–400.0)
RBC: 4.49 Mil/uL (ref 4.22–5.81)
RDW: 14.5 % (ref 11.5–14.6)
WBC: 10.6 10*3/uL — ABNORMAL HIGH (ref 4.5–10.5)

## 2013-03-10 LAB — T4, FREE: Free T4: 1.32 ng/dL (ref 0.60–1.60)

## 2013-03-10 LAB — HM DIABETES FOOT EXAM

## 2013-03-10 LAB — TSH: TSH: 1.69 u[IU]/mL (ref 0.35–5.50)

## 2013-03-10 LAB — HEMOGLOBIN A1C: Hgb A1c MFr Bld: 8.3 % — ABNORMAL HIGH (ref 4.6–6.5)

## 2013-03-10 LAB — LDL CHOLESTEROL, DIRECT: LDL DIRECT: 78.3 mg/dL

## 2013-03-10 NOTE — Progress Notes (Signed)
Subjective:    Patient ID: Craig Lindsey, male    DOB: 1960/07/10, 53 y.o.   MRN: [id number]  HPI Here for physical  Has appointment for eye exam Sugars seem about the same Checks twice a day--- AM usually 160. Evenings ~180 No hypoglycemia  Not really any exercise--does a little walking only. May be motivational  Just had colonoscopy  Keeps up with oncologist as well  No heart symptoms  Current Outpatient Prescriptions on File Prior to Visit  Medication Sig Dispense Refill  . amiodarone (PACERONE) 200 MG tablet Take 200 mg by mouth every morning.      Marland Kitchen atorvastatin (LIPITOR) 10 MG tablet Take 10 mg by mouth every morning.      . BD ULTRA-FINE PEN NEEDLES 29G X 12.7MM MISC USE WITH INSULIN PEN AS DIRECTED  100 each  3  . diltiazem (DILACOR XR) 240 MG 24 hr capsule Take 240 mg by mouth every morning.      . furosemide (LASIX) 40 MG tablet Take 40 mg by mouth every morning.      Marland Kitchen glipiZIDE (GLUCOTROL XL) 10 MG 24 hr tablet Take 10 mg by mouth daily with breakfast.      . insulin glargine (LANTUS) 100 UNIT/ML injection Inject 28 Units into the skin at bedtime.      . Insulin Pen Needle 29G X 12.7MM MISC Inject 20 Units into the muscle daily.        Marland Kitchen lisinopril (PRINIVIL,ZESTRIL) 40 MG tablet Take 40 mg by mouth every morning.      . metFORMIN (GLUCOPHAGE) 1000 MG tablet Take 1,000 mg by mouth 2 (two) times daily with a meal.      . metoprolol succinate (TOPROL-XL) 100 MG 24 hr tablet Take 150 mg by mouth every morning. Take with or immediately following a meal.      . multivitamin (THERAGRAN) per tablet Take 1 tablet by mouth daily.        . NON FORMULARY Knee high compression hose 20-30 mmhg. Apply daily and remove at bedtime.       . sitaGLIPtin (JANUVIA) 100 MG tablet Take 100 mg by mouth every morning.      Marland Kitchen spironolactone (ALDACTONE) 50 MG tablet Take 50 mg by mouth 2 (two) times daily.      Marland Kitchen warfarin (COUMADIN) 4 MG tablet Take 6-8 mg by mouth daily. 6 mg every day but  on mondays 8 mg      . warfarin (COUMADIN) 6 MG tablet Take 1 tablet (6 mg total) by mouth daily.  90 tablet  0   No current facility-administered medications on file prior to visit.    No Known Allergies  Past Medical History  Diagnosis Date  . Diabetes mellitus     Type II  . Hyperlipidemia   . Hypertension   . Arrhythmia 12/04    1. Ventricular tachycardia; Dr. [name]  2. Atrial fibrillation  . Obesity   . Hemorrhoids     hx of  . Personal history of colon cancer, stage III 1/09    Dr. Carron Brazen, D. [place]  . Neuropathy     Oxaliplatin  . DVT (deep venous thrombosis) 7/09    RLE, Tx Arixtra  . Sigmoid colon ulcer 5 yrs ago    Sigmoid; Nydia Bouton, MD (colon cancer)  . Sleep apnea     uses cpap setting of 10  . Chronic kidney disease     Complex L renal cyst, no tx for   .  Umbilical hernia     Past Surgical History  Procedure Laterality Date  . Cardiac defibrillator placement    . Colectomy  1/09    Sigmoid; Nydia Bouton, MD (colon cancer)  . Cholecystectomy    . Colonoscopy    . Hernia repair  yrs ago    umb hernia  . Colonoscopy N/A 02/25/2013    Procedure: COLONOSCOPY;  Surgeon: Gatha Mayer, MD;  Location: [facility];  Service: Endoscopy;  Laterality: N/A;    Family History  Problem Relation Age of Onset  . Diabetes Father   . Diabetes Maternal Grandmother   . Hypertension Neg Hx   . Coronary artery disease Neg Hx   . Prostate cancer Neg Hx   . Colon cancer Other   . Cancer Maternal Aunt     colon    History   Social History  . Marital Status: Married    Spouse Name: N/A    Number of Children: 2  . Years of Education: N/A   Occupational History  . [employer]     Disabled   Social History Main Topics  . Smoking status: Never Smoker   . Smokeless tobacco: Never Used  . Alcohol Use: No  . Drug Use: No  . Sexual Activity: Yes   Other Topics Concern  . Not on file   Social History Narrative   2 step children from wife's 2nd  marriage.   Review of Systems  Constitutional: Negative for fatigue and unexpected weight change.       Wears seat belt  HENT: Negative for dental problem, hearing loss and tinnitus.        Regular with dentist  Eyes: Negative for visual disturbance.       No diplopia or unilateral vision loss  Respiratory: Negative for cough, chest tightness and shortness of breath.   Cardiovascular: Positive for leg swelling. Negative for chest pain and palpitations.  Gastrointestinal: Positive for abdominal pain. Negative for nausea, vomiting, constipation and blood in stool.       Rare pain from ventral hernia---like pants pushing on it  Endocrine: Negative for cold intolerance and heat intolerance.  Genitourinary: Positive for difficulty urinating. Negative for urgency and frequency.       Occasional dribbling No sexual problems  Skin: Negative for rash.       Some dry skin spots on hands  Allergic/Immunologic: Negative for environmental allergies and immunocompromised state.  Neurological: Positive for numbness. Negative for dizziness, syncope, weakness, light-headedness and headaches.       Same feet numbness and tingling---no sig pain  Hematological: Negative for adenopathy. Does not bruise/bleed easily.  Psychiatric/Behavioral: Negative for sleep disturbance and dysphoric mood. The patient is not nervous/anxious.        Sleeps well with the CPAP       Objective:   Physical Exam  Constitutional: He is oriented to person, place, and time. He appears well-developed. No distress.  HENT:  Head: Normocephalic and atraumatic.  Right Ear: External ear normal.  Left Ear: External ear normal.  Mouth/Throat: Oropharynx is clear and moist. No oropharyngeal exudate.  Eyes: Conjunctivae and EOM are normal. Pupils are equal, round, and reactive to light.  Neck: Normal range of motion. Neck supple. No thyromegaly present.  Cardiovascular: Normal rate, regular rhythm, normal heart sounds and intact  distal pulses.  Exam reveals no gallop.   No murmur heard. Pulmonary/Chest: Effort normal and breath sounds normal. No respiratory distress. He has no wheezes. He has no  rales.  Abdominal: Soft. He exhibits no mass. There is no tenderness. There is no rebound and no guarding.  Musculoskeletal: He exhibits no tenderness.  Thick calves without pitting Chronic venous stasis changes  Lymphadenopathy:    He has no cervical adenopathy.  Neurological: He is alert and oriented to person, place, and time.  Decreased sensation in feet  Skin:  Slight plantar callous No foot ulcers  Psychiatric: He has a normal mood and affect. His behavior is normal.          Assessment & Plan:

## 2013-03-10 NOTE — Assessment & Plan Note (Signed)
BP Readings from Last 3 Encounters:  03/10/13 110/60  02/25/13 146/94  02/25/13 146/94   Good control No changes needed

## 2013-03-10 NOTE — Progress Notes (Signed)
Pre visit review using our clinic review tool, if applicable. No additional management support is needed unless otherwise documented below in the visit note. 

## 2013-03-10 NOTE — Assessment & Plan Note (Signed)
Will defer PSA to next year Just had colonoscopy UTD on imms

## 2013-03-10 NOTE — Assessment & Plan Note (Signed)
Paced On coumadin--no symptoms of tachycardia

## 2013-03-10 NOTE — Assessment & Plan Note (Signed)
Sensory but no pain So no meds for now

## 2013-03-10 NOTE — Patient Instructions (Signed)
Diabetes and Exercise Exercising regularly is important. It is not just about losing weight. It has many health benefits, such as:  Improving your overall fitness, flexibility, and endurance.  Increasing your bone density.  Helping with weight control.  Decreasing your body fat.  Increasing your muscle strength.  Reducing stress and tension.  Improving your overall health. People with diabetes who exercise gain additional benefits because exercise:  Reduces appetite.  Improves the body's use of blood sugar (glucose).  Helps lower or control blood glucose.  Decreases blood pressure.  Helps control blood lipids (such as cholesterol and triglycerides).  Improves the body's use of the hormone insulin by:  Increasing the body's insulin sensitivity.  Reducing the body's insulin needs.  Decreases the risk for heart disease because exercising:  Lowers cholesterol and triglycerides levels.  Increases the levels of good cholesterol (such as high-density lipoproteins [HDL]) in the body.  Lowers blood glucose levels. YOUR ACTIVITY PLAN  Choose an activity that you enjoy and set realistic goals. Your health care provider or diabetes educator can help you make an activity plan that works for you. You can break activities into 2 or 3 sessions throughout the day. Doing so is as good as one long session. Exercise ideas include:  Taking the dog for a walk.  Taking the stairs instead of the elevator.  Dancing to your favorite song.  Doing your favorite exercise with a friend. RECOMMENDATIONS FOR EXERCISING WITH TYPE 1 OR TYPE 2 DIABETES   Check your blood glucose before exercising. If blood glucose levels are greater than 240 mg/dL, check for urine ketones. Do not exercise if ketones are present.  Avoid injecting insulin into areas of the body that are going to be exercised. For example, avoid injecting insulin into:  The arms when playing tennis.  The legs when  jogging.  Keep a record of:  Food intake before and after you exercise.  Expected peak times of insulin action.  Blood glucose levels before and after you exercise.  The type and amount of exercise you have done.  Review your records with your health care provider. Your health care provider will help you to develop guidelines for adjusting food intake and insulin amounts before and after exercising.  If you take insulin or oral hypoglycemic agents, watch for signs and symptoms of hypoglycemia. They include:  Dizziness.  Shaking.  Sweating.  Chills.  Confusion.  Drink plenty of water while you exercise to prevent dehydration or heat stroke. Body water is lost during exercise and must be replaced.  Talk to your health care provider before starting an exercise program to make sure it is safe for you. Remember, almost any type of activity is better than none. Document Released: 03/25/2003 Document Revised: 09/04/2012 Document Reviewed: 06/11/2012 Exit[employer] Patient Information 2014 [employer]. Diabetes Meal Planning Guide The diabetes meal planning guide is a tool to help you plan your meals and snacks. It is important for people with diabetes to manage their blood glucose (sugar) levels. Choosing the right foods and the right amounts throughout your day will help control your blood glucose. Eating right can even help you improve your blood pressure and reach or maintain a healthy weight. CARBOHYDRATE COUNTING MADE EASY When you eat carbohydrates, they turn to sugar. This raises your blood glucose level. Counting carbohydrates can help you control this level so you feel better. When you plan your meals by counting carbohydrates, you can have more flexibility in what you eat and balance your medicine   with your food intake. Carbohydrate counting simply means adding up the total amount of carbohydrate grams in your meals and snacks. Try to eat about the same amount at each meal. Foods  with carbohydrates are listed below. Each portion below is 1 carbohydrate serving or 15 grams of carbohydrates. Ask your dietician how many grams of carbohydrates you should eat at each meal or snack. Grains and Starches  1 slice bread.   English muffin or hotdog/hamburger bun.   cup cold cereal (unsweetened).   cup cooked pasta or rice.   cup starchy vegetables (corn, potatoes, peas, beans, winter squash).  1 tortilla (6 inches).   bagel.  1 waffle or pancake (size of a CD).   cup cooked cereal.  4 to 6 small crackers. *Whole grain is recommended. Fruit  1 cup fresh unsweetened berries, melon, papaya, pineapple.  1 small fresh fruit.   banana or mango.   cup fruit juice (4 oz unsweetened).   cup canned fruit in natural juice or water.  2 tbs dried fruit.  12 to 15 grapes or cherries. Milk and Yogurt  1 cup fat-free or 1% milk.  1 cup soy milk.  6 oz light yogurt with sugar-free sweetener.  6 oz low-fat soy yogurt.  6 oz plain yogurt. Vegetables  1 cup raw or  cup cooked is counted as 0 carbohydrates or a "free" food.  If you eat 3 or more servings at 1 meal, count them as 1 carbohydrate serving. Other Carbohydrates   oz chips or pretzels.   cup ice cream or frozen yogurt.   cup sherbet or sorbet.  2 inch square cake, no frosting.  1 tbs honey, sugar, jam, jelly, or syrup.  2 small cookies.  3 squares of graham crackers.  3 cups popcorn.  6 crackers.  1 cup broth-based soup.  Count 1 cup casserole or other mixed foods as 2 carbohydrate servings.  Foods with less than 20 calories in a serving may be counted as 0 carbohydrates or a "free" food. You may want to purchase a book or computer software that lists the carbohydrate gram counts of different foods. In addition, the nutrition facts panel on the labels of the foods you eat are a good source of this information. The label will tell you how big the serving size is and the  total number of carbohydrate grams you will be eating per serving. Divide this number by 15 to obtain the number of carbohydrate servings in a portion. Remember, 1 carbohydrate serving equals 15 grams of carbohydrate. SERVING SIZES Measuring foods and serving sizes helps you make sure you are getting the right amount of food. The list below tells how big or small some common serving sizes are.  1 oz.........4 stacked dice.  3 oz.........Deck of cards.  1 tsp........Tip of little finger.  1 tbs........Thumb.  2 tbs........Golf ball.   cup.......Half of a fist.  1 cup........A fist. SAMPLE DIABETES MEAL PLAN Below is a sample meal plan that includes foods from the grain and starches, dairy, vegetable, fruit, and meat groups. A dietician can individualize a meal plan to fit your calorie needs and tell you the number of servings needed from each food group. However, controlling the total amount of carbohydrates in your meal or snack is more important than making sure you include all of the food groups at every meal. You may interchange carbohydrate containing foods (dairy, starches, and fruits). The meal plan below is an example of a 2000 calorie diet   using carbohydrate counting. This meal plan has 17 carbohydrate servings. Breakfast  1 cup oatmeal (2 carb servings).   cup light yogurt (1 carb serving).  1 cup blueberries (1 carb serving).   cup almonds. Snack  1 large apple (2 carb servings).  1 low-fat string cheese stick. Lunch  Chicken breast salad.  1 cup spinach.   cup chopped tomatoes.  2 oz chicken breast, sliced.  2 tbs low-fat Italian dressing.  12 whole-wheat crackers (2 carb servings).  12 to 15 grapes (1 carb serving).  1 cup low-fat milk (1 carb serving). Snack  1 cup carrots.   cup hummus (1 carb serving). Dinner  3 oz broiled salmon.  1 cup brown rice (3 carb servings). Snack  1  cups steamed broccoli (1 carb serving) drizzled with 1  tsp olive oil and lemon juice.  1 cup light pudding (2 carb servings). DIABETES MEAL PLANNING WORKSHEET Your dietician can use this worksheet to help you decide how many servings of foods and what types of foods are right for you.  BREAKFAST Food Group and Servings / Carb Servings Grain/Starches __________________________________ Dairy __________________________________________ Vegetable ______________________________________ Fruit ___________________________________________ Meat __________________________________________ Fat ____________________________________________ LUNCH Food Group and Servings / Carb Servings Grain/Starches ___________________________________ Dairy ___________________________________________ Fruit ____________________________________________ Meat ___________________________________________ Fat _____________________________________________ DINNER Food Group and Servings / Carb Servings Grain/Starches ___________________________________ Dairy ___________________________________________ Fruit ____________________________________________ Meat ___________________________________________ Fat _____________________________________________ SNACKS Food Group and Servings / Carb Servings Grain/Starches ___________________________________ Dairy ___________________________________________ Vegetable _______________________________________ Fruit ____________________________________________ Meat ___________________________________________ Fat _____________________________________________ DAILY TOTALS Starches _________________________ Vegetable ________________________ Fruit ____________________________ Dairy ____________________________ Meat ____________________________ Fat ______________________________ Document Released: 09/29/2004 Document Revised: 03/27/2011 Document Reviewed: 08/10/2008 [employer] Patient Information 2014 [employer].  

## 2013-03-10 NOTE — Assessment & Plan Note (Signed)
Discussed lifestyle If any worse, will send to Dr Cruzita Lederer

## 2013-03-10 NOTE — Assessment & Plan Note (Signed)
Discussed lifestyle issues

## 2013-03-19 ENCOUNTER — Encounter: Payer: Self-pay | Admitting: Internal Medicine

## 2013-03-19 ENCOUNTER — Other Ambulatory Visit: Payer: Self-pay | Admitting: Internal Medicine

## 2013-03-19 MED ORDER — GLUCOSE BLOOD VI STRP: ORAL_STRIP | Status: DC

## 2013-03-23 ENCOUNTER — Other Ambulatory Visit: Payer: Self-pay | Admitting: Internal Medicine

## 2013-03-25 ENCOUNTER — Telehealth: Payer: Self-pay | Admitting: Internal Medicine

## 2013-03-25 NOTE — Telephone Encounter (Signed)
Relevant patient education assigned to patient using Emmi. ° °

## 2013-04-02 ENCOUNTER — Ambulatory Visit (INDEPENDENT_AMBULATORY_CARE_PROVIDER_SITE_OTHER): Payer: Medicare HMO | Admitting: Family Medicine

## 2013-04-02 DIAGNOSIS — I82409 Acute embolism and thrombosis of unspecified deep veins of unspecified lower extremity: Secondary | ICD-10-CM

## 2013-04-02 DIAGNOSIS — Z5181 Encounter for therapeutic drug level monitoring: Secondary | ICD-10-CM

## 2013-04-02 DIAGNOSIS — Z7901 Long term (current) use of anticoagulants: Secondary | ICD-10-CM

## 2013-04-02 LAB — POCT INR: INR: 2.8

## 2013-04-03 ENCOUNTER — Ambulatory Visit: Payer: Medicare HMO

## 2013-04-03 LAB — HM DIABETES EYE EXAM

## 2013-04-29 ENCOUNTER — Ambulatory Visit (INDEPENDENT_AMBULATORY_CARE_PROVIDER_SITE_OTHER): Payer: Commercial Managed Care - HMO | Admitting: Internal Medicine

## 2013-04-29 ENCOUNTER — Encounter: Payer: Self-pay | Admitting: Internal Medicine

## 2013-04-29 VITALS — BP 158/84 | HR 87 | Ht 76.0 in | Wt >= 6400 oz

## 2013-04-29 DIAGNOSIS — I4891 Unspecified atrial fibrillation: Secondary | ICD-10-CM

## 2013-04-29 DIAGNOSIS — I472 Ventricular tachycardia: Secondary | ICD-10-CM

## 2013-04-29 DIAGNOSIS — I4729 Other ventricular tachycardia: Secondary | ICD-10-CM

## 2013-04-29 DIAGNOSIS — Z95810 Presence of automatic (implantable) cardiac defibrillator: Secondary | ICD-10-CM

## 2013-04-29 LAB — MDC_IDC_ENUM_SESS_TYPE_INCLINIC
Date Time Interrogation Session: 20150414163044
HighPow Impedance: 190 Ohm
HighPow Impedance: 532 Ohm
HighPow Impedance: 56 Ohm
HighPow Impedance: 68 Ohm
Lead Channel Pacing Threshold Amplitude: 0.625 V
Lead Channel Pacing Threshold Pulse Width: 0.4 ms
Lead Channel Sensing Intrinsic Amplitude: 10.125 mV
Lead Channel Setting Pacing Amplitude: 2.5 V
Lead Channel Setting Sensing Sensitivity: 0.3 mV
MDC IDC MSMT BATTERY VOLTAGE: 3.09 V
MDC IDC MSMT LEADCHNL RV IMPEDANCE VALUE: 703 Ohm
MDC IDC MSMT LEADCHNL RV SENSING INTR AMPL: 10.5 mV
MDC IDC SET LEADCHNL RV PACING PULSEWIDTH: 0.4 ms
MDC IDC SET ZONE DETECTION INTERVAL: 300 ms
MDC IDC STAT BRADY RV PERCENT PACED: 0.03 %
Zone Setting Detection Interval: 340 ms
Zone Setting Detection Interval: 450 ms

## 2013-04-29 NOTE — Assessment & Plan Note (Signed)
He has had no additional ventricular arrhythmias since his last visit. He will continue his current medications.

## 2013-04-29 NOTE — Assessment & Plan Note (Signed)
He has had no recurrent atrial fib. He will continue his current meds including amiodarone.

## 2013-04-29 NOTE — Progress Notes (Signed)
HPI Craig Lindsey returns today for followup. He is a 53 year old man with morbid obesity, paroxysmal atrial fibrillation, ventricular fibrillation, status post ICD implantation. The patient has a history of colon cancer, status post resection and chemotherapy. He has been cancer free for five years. He does not have much in the way of heart failure symptoms, class II diastolic heart failure at the most. He has minimal peripheral edema. No syncope or recent ICD shock. No Known Allergies   Current Outpatient Prescriptions  Medication Sig Dispense Refill  . amiodarone (PACERONE) 200 MG tablet Take 200 mg by mouth every morning.      Marland Kitchen atorvastatin (LIPITOR) 10 MG tablet TAKE 1 TABLET (10 MG TOTAL) BY MOUTH DAILY.  90 tablet  3  . diltiazem (DILACOR XR) 240 MG 24 hr capsule Take 240 mg by mouth every morning.      . furosemide (LASIX) 40 MG tablet Take 40 mg by mouth every morning.      Marland Kitchen glipiZIDE (GLUCOTROL XL) 10 MG 24 hr tablet Take 10 mg by mouth daily with breakfast.      . JANUVIA 100 MG tablet TAKE 1 TABLET BY MOUTH EVERY DAY  90 tablet  2  . LANTUS SOLOSTAR 100 UNIT/ML Solostar Pen INJECT 0.27 MLS (27 UNITS TOTAL) INTO THE SKIN AT BEDTIME.  15 pen  5  . lisinopril (PRINIVIL,ZESTRIL) 40 MG tablet TAKE 1 TABLET BY MOUTH EVERY DAY  90 tablet  3  . metFORMIN (GLUCOPHAGE) 1000 MG tablet Take 1,000 mg by mouth 2 (two) times daily with a meal.      . metoprolol succinate (TOPROL-XL) 100 MG 24 hr tablet Take 150 mg by mouth every morning. Take with or immediately following a meal.      . multivitamin (THERAGRAN) per tablet Take 1 tablet by mouth daily.        . NON FORMULARY Knee high compression hose 20-30 mmhg. Apply daily and remove at bedtime.       Marland Kitchen spironolactone (ALDACTONE) 50 MG tablet Take 50 mg by mouth 2 (two) times daily.      Marland Kitchen warfarin (COUMADIN) 6 MG tablet Take 1 tablet (6 mg total) by mouth daily.  90 tablet  0   No current facility-administered medications for this visit.      Past Medical History  Diagnosis Date  . Diabetes mellitus     Type II  . Hyperlipidemia   . Hypertension   . Arrhythmia 12/04    1. Ventricular tachycardia; Dr. [name]  2. Atrial fibrillation  . Obesity   . Hemorrhoids     hx of  . Personal history of colon cancer, stage III 1/09    Dr. Carron Brazen, D. [place]  . Neuropathy     Oxaliplatin  . DVT (deep venous thrombosis) 7/09    RLE, Tx Arixtra  . Sigmoid colon ulcer 5 yrs ago    Sigmoid; Nydia Bouton, MD (colon cancer)  . Sleep apnea     uses cpap setting of 10  . Chronic kidney disease     Complex L renal cyst, no tx for   . Umbilical hernia     ROS:   All systems reviewed and negative except as noted in the HPI.   Past Surgical History  Procedure Laterality Date  . Cardiac defibrillator placement    . Colectomy  1/09    Sigmoid; Nydia Bouton, MD (colon cancer)  . Cholecystectomy    . Colonoscopy    . Hernia repair  yrs ago    umb hernia  . Colonoscopy N/A 02/25/2013    Procedure: COLONOSCOPY;  Surgeon: Gatha Mayer, MD;  Location: [facility];  Service: Endoscopy;  Laterality: N/A;     Family History  Problem Relation Age of Onset  . Diabetes Father   . Diabetes Maternal Grandmother   . Hypertension Neg Hx   . Coronary artery disease Neg Hx   . Prostate cancer Neg Hx   . Colon cancer Other   . Cancer Maternal Aunt     colon     History   Social History  . Marital Status: Married    Spouse Name: N/A    Number of Children: 2  . Years of Education: N/A   Occupational History  . [employer]     Disabled   Social History Main Topics  . Smoking status: Never Smoker   . Smokeless tobacco: Never Used  . Alcohol Use: No  . Drug Use: No  . Sexual Activity: Yes   Other Topics Concern  . Not on file   Social History Narrative   2 step children from wife's 2nd marriage.     BP 158/84  Pulse 87  Ht 6\' 4"  (1.93 m)  Wt 459 lb 1.9 oz (208.255 kg)  BMI 55.91 kg/m2  Physical  Exam:  Well appearing morbidly obese, middle-aged man, NAD HEENT: Unremarkable Neck:   7 cm JVD, no thyromegally Lungs:  Clear with no wheezes, rales, or rhonchi. HEART:  Regular rate rhythm, no murmurs, no rubs, no clicks, distant heart sounds. Abd:  soft, obese, positive bowel sounds, no organomegally, no rebound, no guarding Ext:  2 plus pulses, 1+ peripheral edema, no cyanosis, no clubbing Skin:  No rashes no nodules Neuro:  CN II through XII intact, motor grossly intact  DEVICE  Normal device function.  See PaceArt for details.   Assess/Plan:

## 2013-04-29 NOTE — Patient Instructions (Signed)

## 2013-04-30 ENCOUNTER — Encounter: Payer: Self-pay | Admitting: Internal Medicine

## 2013-05-07 ENCOUNTER — Other Ambulatory Visit: Payer: Self-pay | Admitting: Internal Medicine

## 2013-05-15 ENCOUNTER — Ambulatory Visit (INDEPENDENT_AMBULATORY_CARE_PROVIDER_SITE_OTHER): Payer: Commercial Managed Care - HMO | Admitting: Family Medicine

## 2013-05-15 DIAGNOSIS — Z7901 Long term (current) use of anticoagulants: Secondary | ICD-10-CM

## 2013-05-15 DIAGNOSIS — I82409 Acute embolism and thrombosis of unspecified deep veins of unspecified lower extremity: Secondary | ICD-10-CM

## 2013-05-15 DIAGNOSIS — Z5181 Encounter for therapeutic drug level monitoring: Secondary | ICD-10-CM

## 2013-05-15 LAB — POCT INR: INR: 2.8

## 2013-05-20 ENCOUNTER — Other Ambulatory Visit: Payer: Self-pay | Admitting: Internal Medicine

## 2013-06-26 ENCOUNTER — Ambulatory Visit (INDEPENDENT_AMBULATORY_CARE_PROVIDER_SITE_OTHER): Payer: Commercial Managed Care - HMO | Admitting: Family Medicine

## 2013-06-26 DIAGNOSIS — I82409 Acute embolism and thrombosis of unspecified deep veins of unspecified lower extremity: Secondary | ICD-10-CM

## 2013-06-26 DIAGNOSIS — Z5181 Encounter for therapeutic drug level monitoring: Secondary | ICD-10-CM

## 2013-06-26 DIAGNOSIS — Z7901 Long term (current) use of anticoagulants: Secondary | ICD-10-CM

## 2013-06-26 LAB — POCT INR: INR: 2.6

## 2013-07-23 ENCOUNTER — Telehealth: Payer: Self-pay | Admitting: Cardiology

## 2013-07-23 NOTE — Telephone Encounter (Signed)
Spoke with pt and informed pt to send manual transmission so monitor can receive updates.

## 2013-07-31 ENCOUNTER — Ambulatory Visit (INDEPENDENT_AMBULATORY_CARE_PROVIDER_SITE_OTHER): Payer: Medicare HMO | Admitting: *Deleted

## 2013-07-31 DIAGNOSIS — I472 Ventricular tachycardia: Secondary | ICD-10-CM

## 2013-07-31 DIAGNOSIS — I4729 Other ventricular tachycardia: Secondary | ICD-10-CM

## 2013-07-31 NOTE — Progress Notes (Signed)
Remote ICD transmission.   

## 2013-08-05 ENCOUNTER — Other Ambulatory Visit: Payer: Self-pay | Admitting: Internal Medicine

## 2013-08-07 ENCOUNTER — Ambulatory Visit (INDEPENDENT_AMBULATORY_CARE_PROVIDER_SITE_OTHER): Payer: Commercial Managed Care - HMO | Admitting: Family Medicine

## 2013-08-07 DIAGNOSIS — Z7901 Long term (current) use of anticoagulants: Secondary | ICD-10-CM

## 2013-08-07 DIAGNOSIS — Z5181 Encounter for therapeutic drug level monitoring: Secondary | ICD-10-CM

## 2013-08-07 DIAGNOSIS — I82409 Acute embolism and thrombosis of unspecified deep veins of unspecified lower extremity: Secondary | ICD-10-CM

## 2013-08-07 LAB — POCT INR: INR: 2

## 2013-08-08 LAB — MDC_IDC_ENUM_SESS_TYPE_REMOTE
Battery Voltage: 3.04 V
Date Time Interrogation Session: 20150710164427
HIGH POWER IMPEDANCE MEASURED VALUE: 52 Ohm
HIGH POWER IMPEDANCE MEASURED VALUE: 66 Ohm
HighPow Impedance: 228 Ohm
HighPow Impedance: 532 Ohm
Lead Channel Pacing Threshold Amplitude: 0.625 V
Lead Channel Sensing Intrinsic Amplitude: 10.875 mV
Lead Channel Setting Pacing Pulse Width: 0.4 ms
Lead Channel Setting Sensing Sensitivity: 0.3 mV
MDC IDC MSMT LEADCHNL RV IMPEDANCE VALUE: 608 Ohm
MDC IDC MSMT LEADCHNL RV PACING THRESHOLD PULSEWIDTH: 0.4 ms
MDC IDC MSMT LEADCHNL RV SENSING INTR AMPL: 10.875 mV
MDC IDC SET LEADCHNL RV PACING AMPLITUDE: 2.5 V
MDC IDC SET ZONE DETECTION INTERVAL: 450 ms
MDC IDC STAT BRADY RV PERCENT PACED: 0.01 %
Zone Setting Detection Interval: 300 ms
Zone Setting Detection Interval: 340 ms

## 2013-08-22 ENCOUNTER — Encounter: Payer: Self-pay | Admitting: Cardiology

## 2013-08-27 ENCOUNTER — Encounter: Payer: Self-pay | Admitting: Internal Medicine

## 2013-09-02 ENCOUNTER — Other Ambulatory Visit: Payer: Self-pay | Admitting: Internal Medicine

## 2013-09-04 ENCOUNTER — Ambulatory Visit: Payer: Commercial Managed Care - HMO

## 2013-09-08 ENCOUNTER — Ambulatory Visit: Payer: Medicare HMO | Admitting: Internal Medicine

## 2013-09-11 ENCOUNTER — Ambulatory Visit (INDEPENDENT_AMBULATORY_CARE_PROVIDER_SITE_OTHER): Payer: Commercial Managed Care - HMO | Admitting: Family Medicine

## 2013-09-11 DIAGNOSIS — Z5181 Encounter for therapeutic drug level monitoring: Secondary | ICD-10-CM

## 2013-09-11 DIAGNOSIS — Z7901 Long term (current) use of anticoagulants: Secondary | ICD-10-CM

## 2013-09-11 DIAGNOSIS — I82409 Acute embolism and thrombosis of unspecified deep veins of unspecified lower extremity: Secondary | ICD-10-CM

## 2013-09-11 LAB — POCT INR: INR: 3.3

## 2013-10-20 ENCOUNTER — Ambulatory Visit: Payer: Medicare HMO | Admitting: Internal Medicine

## 2013-10-20 ENCOUNTER — Ambulatory Visit: Payer: Commercial Managed Care - HMO

## 2013-10-28 ENCOUNTER — Other Ambulatory Visit: Payer: Self-pay | Admitting: Internal Medicine

## 2013-11-03 ENCOUNTER — Ambulatory Visit (INDEPENDENT_AMBULATORY_CARE_PROVIDER_SITE_OTHER): Payer: Commercial Managed Care - HMO | Admitting: *Deleted

## 2013-11-03 ENCOUNTER — Encounter: Payer: Commercial Managed Care - HMO | Admitting: *Deleted

## 2013-11-03 ENCOUNTER — Telehealth: Payer: Self-pay | Admitting: Cardiology

## 2013-11-03 DIAGNOSIS — Z7901 Long term (current) use of anticoagulants: Secondary | ICD-10-CM

## 2013-11-03 DIAGNOSIS — Z5181 Encounter for therapeutic drug level monitoring: Secondary | ICD-10-CM

## 2013-11-03 DIAGNOSIS — I82409 Acute embolism and thrombosis of unspecified deep veins of unspecified lower extremity: Secondary | ICD-10-CM

## 2013-11-03 LAB — POCT INR: INR: 3.8

## 2013-11-03 MED ORDER — WARFARIN SODIUM 6 MG PO TABS: ORAL_TABLET | ORAL | Status: DC

## 2013-11-03 NOTE — Telephone Encounter (Signed)
Spoke with pt and reminded pt of remote transmission that is due today. Pt verbalized understanding.   

## 2013-11-05 ENCOUNTER — Ambulatory Visit: Payer: Medicare HMO | Admitting: Internal Medicine

## 2013-11-11 ENCOUNTER — Ambulatory Visit (INDEPENDENT_AMBULATORY_CARE_PROVIDER_SITE_OTHER): Payer: Medicare HMO | Admitting: Internal Medicine

## 2013-11-11 ENCOUNTER — Encounter: Payer: Self-pay | Admitting: Internal Medicine

## 2013-11-11 VITALS — BP 128/70 | HR 75 | Temp 97.7°F | Wt >= 6400 oz

## 2013-11-11 DIAGNOSIS — N183 Chronic kidney disease, stage 3 unspecified: Secondary | ICD-10-CM

## 2013-11-11 DIAGNOSIS — E1140 Type 2 diabetes mellitus with diabetic neuropathy, unspecified: Secondary | ICD-10-CM

## 2013-11-11 DIAGNOSIS — E1165 Type 2 diabetes mellitus with hyperglycemia: Secondary | ICD-10-CM

## 2013-11-11 DIAGNOSIS — Z23 Encounter for immunization: Secondary | ICD-10-CM

## 2013-11-11 DIAGNOSIS — Z6841 Body Mass Index (BMI) 40.0 and over, adult: Secondary | ICD-10-CM

## 2013-11-11 DIAGNOSIS — IMO0002 Reserved for concepts with insufficient information to code with codable children: Secondary | ICD-10-CM

## 2013-11-11 DIAGNOSIS — E1121 Type 2 diabetes mellitus with diabetic nephropathy: Secondary | ICD-10-CM | POA: Insufficient documentation

## 2013-11-11 DIAGNOSIS — I4891 Unspecified atrial fibrillation: Secondary | ICD-10-CM

## 2013-11-11 DIAGNOSIS — N1831 Chronic kidney disease, stage 3a: Secondary | ICD-10-CM | POA: Insufficient documentation

## 2013-11-11 DIAGNOSIS — E1142 Type 2 diabetes mellitus with diabetic polyneuropathy: Secondary | ICD-10-CM

## 2013-11-11 LAB — RENAL FUNCTION PANEL
Albumin: 3.3 g/dL — ABNORMAL LOW (ref 3.5–5.2)
BUN: 31 mg/dL — ABNORMAL HIGH (ref 6–23)
CALCIUM: 9.7 mg/dL (ref 8.4–10.5)
CHLORIDE: 109 meq/L (ref 96–112)
CO2: 14 meq/L — AB (ref 19–32)
Creatinine, Ser: 1.9 mg/dL — ABNORMAL HIGH (ref 0.4–1.5)
GFR: 38.59 mL/min — ABNORMAL LOW (ref >60.00)
GLUCOSE: 211 mg/dL — AB (ref 70–99)
POTASSIUM: 4.9 meq/L (ref 3.5–5.1)
Phosphorus: 3.8 mg/dL (ref 2.3–4.6)
Sodium: 137 mEq/L (ref 135–145)

## 2013-11-11 LAB — HEMOGLOBIN A1C: Hgb A1c MFr Bld: 9.1 % — ABNORMAL HIGH (ref 4.6–6.5)

## 2013-11-11 LAB — HM DIABETES FOOT EXAM

## 2013-11-11 NOTE — Assessment & Plan Note (Signed)
Has not been able to lose weight

## 2013-11-11 NOTE — Assessment & Plan Note (Signed)
Didn't increase the lantus as much as I liked If still sig over 8%, will send to Dr Howell Rucks Continuing on metformin despite the kidney issues--has not had a problem and no alternatives he can afford

## 2013-11-11 NOTE — Progress Notes (Signed)
Pre visit review using our clinic review tool, if applicable. No additional management support is needed unless otherwise documented below in the visit note. 

## 2013-11-11 NOTE — Assessment & Plan Note (Signed)
On lisinopril Will recheck today

## 2013-11-11 NOTE — Assessment & Plan Note (Signed)
On lisinopril

## 2013-11-11 NOTE — Progress Notes (Signed)
Subjective:    Patient ID: Craig Lindsey, male    DOB: 10/02/1960, 53 y.o.   MRN: [id number]  HPI Doing okay  Did increase the lantus as discussed but he got it wrong Only has gone up to 30 units  fastings still 165-180 No hypoglycemic reactions No foot ulcers Not taking the [place]--- too expensive  No heart problems No palpitations No chest pain No SOB. Sleeps with HOB up some--- no PND Sleeps well with the CPAP  Current Outpatient Prescriptions on File Prior to Visit  Medication Sig Dispense Refill  . amiodarone (PACERONE) 200 MG tablet TAKE 1 TABLET (200 MG TOTAL) BY MOUTH DAILY.  90 tablet  0  . atorvastatin (LIPITOR) 10 MG tablet TAKE 1 TABLET (10 MG TOTAL) BY MOUTH DAILY.  90 tablet  3  . diltiazem (DILACOR XR) 240 MG 24 hr capsule Take 240 mg by mouth every morning.      . furosemide (LASIX) 40 MG tablet Take 40 mg by mouth every morning.      Marland Kitchen glipiZIDE (GLUCOTROL XL) 10 MG 24 hr tablet TAKE 1 TABLET (10 MG TOTAL) BY MOUTH DAILY.  30 tablet  3  . LANTUS SOLOSTAR 100 UNIT/ML Solostar Pen INJECT 0.27 MLS (27 UNITS TOTAL) INTO THE SKIN AT BEDTIME.  15 pen  5  . lisinopril (PRINIVIL,ZESTRIL) 40 MG tablet TAKE 1 TABLET BY MOUTH EVERY DAY  90 tablet  3  . metFORMIN (GLUCOPHAGE) 1000 MG tablet TAKE 1 TABLET BY MOUTH 2 (TWO) TIMES DAILY.  60 tablet  5  . metoprolol succinate (TOPROL-XL) 100 MG 24 hr tablet TAKE 1 & 1/2 TABLETS BY MOUTH EVERY DAY  45 tablet  3  . multivitamin (THERAGRAN) per tablet Take 1 tablet by mouth daily.        . NON FORMULARY Knee high compression hose 20-30 mmhg. Apply daily and remove at bedtime.       Marland Kitchen spironolactone (ALDACTONE) 50 MG tablet TAKE 1 TABLET (50 MG TOTAL) BY MOUTH 2 (TWO) TIMES DAILY.  120 tablet  3  . warfarin (COUMADIN) 6 MG tablet TAKE 1 TABLET (6 MG TOTAL) BY MOUTH DAILY.  90 tablet  0   No current facility-administered medications on file prior to visit.    No Known Allergies  Past Medical History  Diagnosis Date  .  Diabetes mellitus     Type II  . Hyperlipidemia   . Hypertension   . Arrhythmia 12/04    1. Ventricular tachycardia; Dr. [name]  2. Atrial fibrillation  . Obesity   . Hemorrhoids     hx of  . Personal history of colon cancer, stage III 1/09    Dr. Carron Brazen, D. [place]  . Neuropathy     Oxaliplatin  . DVT (deep venous thrombosis) 7/09    RLE, Tx Arixtra  . Sigmoid colon ulcer 5 yrs ago    Sigmoid; Nydia Bouton, MD (colon cancer)  . Sleep apnea     uses cpap setting of 10  . Chronic kidney disease     Complex L renal cyst, no tx for   . Umbilical hernia     Past Surgical History  Procedure Laterality Date  . Cardiac defibrillator placement    . Colectomy  1/09    Sigmoid; Nydia Bouton, MD (colon cancer)  . Cholecystectomy    . Colonoscopy    . Hernia repair  yrs ago    umb hernia  . Colonoscopy N/A 02/25/2013    Procedure: COLONOSCOPY;  Surgeon: Gatha Mayer, MD;  Location: [facility];  Service: Endoscopy;  Laterality: N/A;    Family History  Problem Relation Age of Onset  . Diabetes Father   . Diabetes Maternal Grandmother   . Hypertension Neg Hx   . Coronary artery disease Neg Hx   . Prostate cancer Neg Hx   . Colon cancer Other   . Cancer Maternal Aunt     colon    History   Social History  . Marital Status: Married    Spouse Name: N/A    Number of Children: 2  . Years of Education: N/A   Occupational History  . [employer]     Disabled   Social History Main Topics  . Smoking status: Never Smoker   . Smokeless tobacco: Never Used  . Alcohol Use: No  . Drug Use: No  . Sexual Activity: Yes   Other Topics Concern  . Not on file   Social History Narrative   2 step children from wife's 2nd marriage.   Review of Systems Weight stable Gets leg pain--he relates to his weight Walks rarely--but has joined Y and will be starting water exercises    Objective:   Physical Exam  Constitutional: He appears well-developed. No distress.  Neck:  Normal range of motion. Neck supple. No thyromegaly present.  Cardiovascular: Normal rate, regular rhythm, normal heart sounds and intact distal pulses.  Exam reveals no gallop.   No murmur heard. Pulmonary/Chest: Effort normal and breath sounds normal. No respiratory distress. He has no wheezes. He has no rales.  Musculoskeletal:  Thick calves without pitting Venous stasis changes in calves  Lymphadenopathy:    He has no cervical adenopathy.  Skin: No rash noted.  No foot ulcers  Psychiatric: He has a normal mood and affect. His behavior is normal.          Assessment & Plan:

## 2013-11-11 NOTE — Assessment & Plan Note (Signed)
Paced No apparent paroxysms On coumadin

## 2013-11-11 NOTE — Assessment & Plan Note (Signed)
Probably mixture of oxaliplatin and diabetes No sig pain so no Rx

## 2013-11-11 NOTE — Addendum Note (Signed)
Addended by: Despina Hidden on: 11/11/2013 03:20 PM   Modules accepted: Orders

## 2013-11-12 ENCOUNTER — Other Ambulatory Visit: Payer: Self-pay | Admitting: Internal Medicine

## 2013-11-12 DIAGNOSIS — E1165 Type 2 diabetes mellitus with hyperglycemia: Principal | ICD-10-CM

## 2013-11-12 DIAGNOSIS — IMO0002 Reserved for concepts with insufficient information to code with codable children: Secondary | ICD-10-CM

## 2013-11-12 DIAGNOSIS — E1140 Type 2 diabetes mellitus with diabetic neuropathy, unspecified: Secondary | ICD-10-CM

## 2013-11-17 ENCOUNTER — Encounter: Payer: Self-pay | Admitting: Cardiology

## 2013-11-20 ENCOUNTER — Ambulatory Visit: Payer: Commercial Managed Care - HMO | Admitting: Endocrinology

## 2013-11-26 ENCOUNTER — Encounter: Payer: Self-pay | Admitting: Cardiology

## 2013-12-02 ENCOUNTER — Telehealth: Payer: Self-pay | Admitting: Internal Medicine

## 2013-12-02 NOTE — Telephone Encounter (Signed)
error 

## 2013-12-04 ENCOUNTER — Ambulatory Visit (INDEPENDENT_AMBULATORY_CARE_PROVIDER_SITE_OTHER): Payer: Commercial Managed Care - HMO | Admitting: Endocrinology

## 2013-12-04 VITALS — BP 136/78 | HR 83 | Wt >= 6400 oz

## 2013-12-04 DIAGNOSIS — E785 Hyperlipidemia, unspecified: Secondary | ICD-10-CM

## 2013-12-04 DIAGNOSIS — Z6841 Body Mass Index (BMI) 40.0 and over, adult: Secondary | ICD-10-CM

## 2013-12-04 DIAGNOSIS — N183 Chronic kidney disease, stage 3 unspecified: Secondary | ICD-10-CM

## 2013-12-04 DIAGNOSIS — IMO0002 Reserved for concepts with insufficient information to code with codable children: Secondary | ICD-10-CM

## 2013-12-04 DIAGNOSIS — E1165 Type 2 diabetes mellitus with hyperglycemia: Secondary | ICD-10-CM

## 2013-12-04 DIAGNOSIS — I10 Essential (primary) hypertension: Secondary | ICD-10-CM

## 2013-12-04 DIAGNOSIS — E1121 Type 2 diabetes mellitus with diabetic nephropathy: Secondary | ICD-10-CM

## 2013-12-04 DIAGNOSIS — E1140 Type 2 diabetes mellitus with diabetic neuropathy, unspecified: Secondary | ICD-10-CM

## 2013-12-04 LAB — HM DIABETES FOOT EXAM: HM DIABETIC FOOT EXAM: ABNORMAL

## 2013-12-04 MED ORDER — INSULIN ASPART PROT & ASPART (70-30 MIX) 100 UNIT/ML PEN: PEN_INJECTOR | SUBCUTANEOUS | Status: DC

## 2013-12-04 MED ORDER — GLUCOSE BLOOD VI STRP
ORAL_STRIP | Status: DC
Start: 2013-12-04 — End: 2013-12-22

## 2013-12-04 MED ORDER — ONETOUCH DELICA LANCETS 33G MISC: Status: DC

## 2013-12-04 MED ORDER — INJECTION DEVICE FOR INSULIN DEVI: Status: DC

## 2013-12-04 NOTE — Assessment & Plan Note (Signed)
Last A1c well above target. We should aim for less stringent A1c goal  Of close to 7.5% in his case due to multiple comorbidities.  Discussed dietary modifications and exercise.   Discussed fingerstick checks- given One touch mini meter Discussed medication regimens- basal/bolus insulin, addition of DPPIV, switch to premixed insulin.   He has elected to try premixed insulin. Will have him stop Lantus and start Novolog mix 70/30 at 20 units twice daily.  He can continue the Glipizide for now, but will re-evaluate need to continue this given that he will be on premixed insulin.   We discussed about risk of lactic acidosis with metformin given his decreased GFR and I dont recommend that we continue this. He is agreeable to stopping this.   RTC 2 weeks

## 2013-12-04 NOTE — Progress Notes (Signed)
Pre visit review using our clinic review tool, if applicable. No additional management support is needed unless otherwise documented below in the visit note. 

## 2013-12-04 NOTE — Assessment & Plan Note (Signed)
Neuropathy with tolerable symptoms . Continue regular self foot care.      

## 2013-12-04 NOTE — Assessment & Plan Note (Signed)
Recent Cr stable and c/w stage 3 CKD    

## 2013-12-04 NOTE — Assessment & Plan Note (Signed)
BP at target today. Update urine MA at next visit.

## 2013-12-04 NOTE — Assessment & Plan Note (Signed)
Last LDL at target on current statin. TGs suboptimal -expect improvement with better sugars.

## 2013-12-04 NOTE — Progress Notes (Signed)
Reason for visit-  Craig Lindsey is a 53 y.o.-year-old male, referred by his PCP,  Venia Carbon, MD for management of Type 2 diabetes, uncontrolled, with complications ( stage 3 CKD, neuropathy).   HPI- Patient has been diagnosed with diabetes in 2009. Recalls being initially on lifestyle modifications.  Tried  Metformin, Glipizide, Januvia. he has  been on insulin since 2 years.  Is in donut hole for past 3 months.    * taken off Januvia , was taking it for about 1 year, stopped 5 months due to cost  Pt is currently on a regimen of: - Metformin 1000 mg po bid -Glipizide 10 mg daily am  - Lantus 36 units qhs ( dose increased few weeks ago)    Last hemoglobin A1c was: trending up after stopping Januvia.  Lab Results  Component Value Date   HGBA1C 9.1* 11/11/2013   HGBA1C 8.3* 03/10/2013   HGBA1C 8.2* 09/04/2012     Pt checks his sugars  2 a day . Uses  One touch ultra 2 glucometer ( 53 years old)- lost his meter over the weekend. By recall they are:  PREMEAL Breakfast Lunch Dinner Bedtime Overall  Glucose range: 165-175   190   Mean/median:        POST-MEAL PC Breakfast PC Lunch PC Dinner  Glucose range:     Mean/median:       Hypoglycemia-  No lows.   Dietary habits- eats three times daily. Occasionally skips BFTries to limit carbs, sweetened beverages, sodas, desserts.  Exercise- trying to walk daily, and  Plans to start at [facility] water aerobics Weight - lost few pounds recently Wt Readings from Last 3 Encounters:  12/04/13 455 lb 4 oz (206.5 kg)  11/11/13 457 lb (207.294 kg)  04/29/13 459 lb 1.9 oz (208.255 kg)    Diabetes Complications-  Nephropathy- Yes, Stage3  CKD, last BUN/creatinine-  Lab Results  Component Value Date   BUN 31* 11/11/2013   CREATININE 1.9* 11/11/2013   Lab Results  Component Value Date   GFR 38.59* 11/11/2013   MICRALBCREAT 11.0 09/13/2010   No recent urine MA testing   Retinopathy- No, Last DEE was in Feb 2015 Neuropathy-  has numbness and tingling in his feet. Known neuropathy, also believed to be secondary to chemo Associated history - No CAD . Has hx afib and vtach s/p ICD and treated with amiodarone, coumadin. No prior stroke. No hypothyroidism. his last TSH was  Lab Results  Component Value Date   TSH 1.69 03/10/2013    Hyperlipidemia-  his last set of lipids were- Currently on Lipitor 10 mg daily. Tolerating well.  Tgs suboptimal.  Lab Results  Component Value Date   CHOL 140 03/10/2013   HDL 36.40* 03/10/2013   LDLCALC 54 02/16/2012   LDLDIRECT 78.3 03/10/2013   TRIG 275.0* 03/10/2013   CHOLHDL 4 03/10/2013    Blood Pressure/HTN- Patient's blood pressure is well controlled today on current regimen that includes ACE-I ( lisinopril).  Pt has FH of DM in father.   I have reviewed the patient's past medical history, family and social history, surgical history, medications and allergies.  Past Medical History  Diagnosis Date  . Diabetes mellitus     Type II  . Hyperlipidemia   . Hypertension   . Arrhythmia 12/04    1. Ventricular tachycardia; Dr. [name]  2. Atrial fibrillation  . Obesity   . Hemorrhoids     hx of  . Personal history of colon cancer,  stage III 1/09    Dr. Carron Brazen, D. [place]  . Neuropathy     Oxaliplatin  . DVT (deep venous thrombosis) 7/09    RLE, Tx Arixtra  . Sigmoid colon ulcer 5 yrs ago    Sigmoid; Nydia Bouton, MD (colon cancer)  . Sleep apnea     uses cpap setting of 10  . Chronic kidney disease     Complex L renal cyst, no tx for   . Umbilical hernia    Past Surgical History  Procedure Laterality Date  . Cardiac defibrillator placement    . Colectomy  1/09    Sigmoid; Nydia Bouton, MD (colon cancer)  . Cholecystectomy    . Colonoscopy    . Hernia repair  yrs ago    umb hernia  . Colonoscopy N/A 02/25/2013    Procedure: COLONOSCOPY;  Surgeon: Gatha Mayer, MD;  Location: [facility];  Service: Endoscopy;  Laterality: N/A;   Family History   Problem Relation Age of Onset  . Diabetes Father   . Diabetes Maternal Grandmother   . Hypertension Neg Hx   . Coronary artery disease Neg Hx   . Prostate cancer Neg Hx   . Colon cancer Other   . Cancer Maternal Aunt     colon   History   Social History  . Marital Status: Married    Spouse Name: N/A    Number of Children: 2  . Years of Education: N/A   Occupational History  . [employer]     Disabled   Social History Main Topics  . Smoking status: Never Smoker   . Smokeless tobacco: Never Used  . Alcohol Use: No  . Drug Use: No  . Sexual Activity: Yes   Other Topics Concern  . Not on file   Social History Narrative   2 step children from wife's 2nd marriage.   Current Outpatient Prescriptions on File Prior to Visit  Medication Sig Dispense Refill  . amiodarone (PACERONE) 200 MG tablet TAKE 1 TABLET (200 MG TOTAL) BY MOUTH DAILY. 90 tablet 0  . atorvastatin (LIPITOR) 10 MG tablet TAKE 1 TABLET (10 MG TOTAL) BY MOUTH DAILY. 90 tablet 3  . BD ULTRA-FINE PEN NEEDLES 29G X 12.7MM MISC     . diltiazem (DILACOR XR) 240 MG 24 hr capsule Take 240 mg by mouth every morning.    . furosemide (LASIX) 40 MG tablet Take 40 mg by mouth every morning.    Marland Kitchen glipiZIDE (GLUCOTROL XL) 10 MG 24 hr tablet TAKE 1 TABLET (10 MG TOTAL) BY MOUTH DAILY. 30 tablet 3  . lisinopril (PRINIVIL,ZESTRIL) 40 MG tablet TAKE 1 TABLET BY MOUTH EVERY DAY 90 tablet 3  . metoprolol succinate (TOPROL-XL) 100 MG 24 hr tablet TAKE 1 & 1/2 TABLETS BY MOUTH EVERY DAY 45 tablet 3  . multivitamin (THERAGRAN) per tablet Take 1 tablet by mouth daily.      . NON FORMULARY Knee high compression hose 20-30 mmhg. Apply daily and remove at bedtime.     Marland Kitchen spironolactone (ALDACTONE) 50 MG tablet TAKE 1 TABLET (50 MG TOTAL) BY MOUTH 2 (TWO) TIMES DAILY. 120 tablet 3  . warfarin (COUMADIN) 6 MG tablet TAKE 1 TABLET (6 MG TOTAL) BY MOUTH DAILY. 90 tablet 0   No current facility-administered medications on file prior to  visit.   No Known Allergies   Review of Systems: [x]  complains of  [  ] denies General:   [  ] Recent weight change [  x ] Fatigue  [  ] Loss of appetite Eyes: [  ]  Vision Difficulty [  ]  Eye pain ENT: [  ]  Hearing difficulty [  ]  Difficulty Swallowing CVS: [  ] Chest pain [  ]  Palpitations/Irregular Heart beat [ x ]  Shortness of breath lying flat [ x ] Swelling of legs Resp: [  ] Frequent Cough [  ] Shortness of Breath  [  ]  Wheezing GI: [  ] Heartburn  [  ] Nausea or Vomiting  [  ] Diarrhea [  ] Constipation  [  ] Abdominal Pain GU: [  ]  Polyuria  [  ]  nocturia Bones/joints:  [ x ]  Muscle aches  [ x ] Joint Pain  [  ] Bone pain Skin/Hair/Nails: [  ]  Rash  [  ] New stretch marks [  ]  Itching [  ] Hair loss [  ]  Excessive hair growth Reproduction: [  ] Low sexual desire , [  ]  Women: Menstrual cycle problems [  ]  Women: Breast Discharge [  ] Men: Difficulty with erections [  ]  Men: Enlarged Breasts CNS: [  ] Frequent Headaches [  ] Blurry vision [  ] Tremors [  ] Seizures [  ] Loss of consciousness [  ] Localized weakness Endocrine: [  ]  Excess thirst [  ]  Feeling excessively hot [  ]  Feeling excessively cold Heme: [  ]  Easy bruising [  ]  Enlarged glands or lumps in neck Allergy: [  ]  Food allergies [  ] Environmental allergies  PE: BP 136/78 mmHg  Pulse 83  Wt 455 lb 4 oz (206.5 kg)  SpO2 96% Wt Readings from Last 3 Encounters:  12/04/13 455 lb 4 oz (206.5 kg)  11/11/13 457 lb (207.294 kg)  04/29/13 459 lb 1.9 oz (208.255 kg)   GENERAL: No acute distress, well developed HEENT:  Eye exam shows normal external appearance. Oral exam shows normal mucosa .  NECK:   Neck exam shows no lymphadenopathy. No Carotids bruits. Thyroid is not enlarged and no nodules felt.  + acanthosis nigricans LUNGS:         Chest is symmetrical. Lungs are clear to auscultation.Marland Kitchen   HEART:         Heart sounds:  S1 and S2 are normal. No murmurs or clicks heard. ABDOMEN:  No Distention  present. Liver and spleen are not palpable. No other mass or tenderness present. Limited by obesity.   EXTREMITIES:     There is 1+ edema. 2+ DP pulses, venostasis chronic changes  NEUROLOGICAL:     Grossly intact.            Diabetic foot exam done with shoes and socks removed: patchy loss of Normal Monofilament testing bilaterally over soles. No deformities of toes.  Nails  Not dystrophic. Skin normal color. No open wounds. Dry skin.  MUSCULOSKELETAL:       There is no enlargement or gross deformity of the joints.  SKIN:       No rash  ASSESSMENT AND PLAN: 1. Type 2 Diabetes, uncontrolled 2. HTN 3. Hyperlipidemia 4. Neuropathy 5. Stage 3 CKD 6. Morbid obesity  Problem List Items Addressed This Visit      Cardiovascular and Mediastinum   Essential hypertension    BP at target today. Update urine MA at next visit.  Endocrine   Type 2 diabetes, uncontrolled, with neuropathy - Primary    Neuropathy with tolerable symptoms . Continue regular self foot care.     Relevant Medications      Insulin Aspart Prot & Aspart (NOVOLOG 70/30 MIX) (70-30) 100 UNIT/ML Pen      injection device for insulin (B-D PEN MINI) DEVI      ONETOUCH DELICA LANCETS 84Z MISC      glucose blood (ONE TOUCH ULTRA TEST) test strip     Genitourinary   Diabetic nephropathy associated with type 2 diabetes mellitus    Last A1c well above target. We should aim for less stringent A1c goal  Of close to 7.5% in his case due to multiple comorbidities.  Discussed dietary modifications and exercise.   Discussed fingerstick checks- given One touch mini meter Discussed medication regimens- basal/bolus insulin, addition of DPPIV, switch to premixed insulin.   He has elected to try premixed insulin. Will have him stop Lantus and start Novolog mix 70/30 at 20 units twice daily.  He can continue the Glipizide for now, but will re-evaluate need to continue this given that he will be on premixed insulin.   We discussed  about risk of lactic acidosis with metformin given his decreased GFR and I dont recommend that we continue this. He is agreeable to stopping this.   RTC 2 weeks      Relevant Medications      Insulin Aspart Prot & Aspart (NOVOLOG 70/30 MIX) (70-30) 100 UNIT/ML Pen   Chronic kidney disease, stage III (moderate)    Recent Cr stable and c/w stage 3 CKD      Other   Hyperlipidemia    Last LDL at target on current statin. TGs suboptimal -expect improvement with better sugars.     Morbid obesity with body mass index of 50.0-59.9 in adult    Encouraged portion control and carb control at this time. Given handout and went over it in detail.  Unfortunately, cant do the newer classes of DM medications that have weight loss as an added benefit due to his decreased GFR.  He could be a candidate for gastric bypass surgery, and was contemplating it 5 years ago. He got diagnosed with colon cancer since then and hasn't reconsidered it.     Relevant Medications      Insulin Aspart Prot & Aspart (NOVOLOG 70/30 MIX) (70-30) 100 UNIT/ML Pen        - Return to clinic in  2 weeks with sugar log/meter.  Delona Clasby [facility] 12/04/2013 4:17 PM

## 2013-12-04 NOTE — Patient Instructions (Signed)
Check sugars 3 times daily ( fasting and premeal readings at alternating times, or at bedtime).  Record them in a sugar log and bring log and meter to next appointment.    Continue current doses of Glipizide  Stop Metformin, lantus  Start  Novolog mix 70/30 at 20 units twice daily (BF and supper)  Will change to Novolin mix 70/30 if the cost of Novolog mix 70/30 is high.   Report back if problems with persistent high or low sugars.   Can start new insulin from tonight. If are starting from tomorrow, then take half dose of lantus tonight and start Novolog mix from breakfast tomorrow.   Please come back for a follow-up appointment in 2 weeks

## 2013-12-04 NOTE — Assessment & Plan Note (Signed)
Encouraged portion control and carb control at this time. Given handout and went over it in detail.  Unfortunately, cant do the newer classes of DM medications that have weight loss as an added benefit due to his decreased GFR.  He could be a candidate for gastric bypass surgery, and was contemplating it 5 years ago. He got diagnosed with colon cancer since then and hasn't reconsidered it.

## 2013-12-05 ENCOUNTER — Encounter: Payer: Self-pay | Admitting: Endocrinology

## 2013-12-08 ENCOUNTER — Other Ambulatory Visit: Payer: Self-pay | Admitting: *Deleted

## 2013-12-08 MED ORDER — FUROSEMIDE 40 MG PO TABS: 40.0000 mg | ORAL_TABLET | Freq: Every morning | ORAL | Status: DC

## 2013-12-08 MED ORDER — SPIRONOLACTONE 50 MG PO TABS: 50.0000 mg | ORAL_TABLET | Freq: Two times a day (BID) | ORAL | Status: DC

## 2013-12-08 MED ORDER — METOPROLOL SUCCINATE ER 100 MG PO TB24: 200.0000 mg | ORAL_TABLET | Freq: Every day | ORAL | Status: DC

## 2013-12-08 MED ORDER — LISINOPRIL 40 MG PO TABS: 40.0000 mg | ORAL_TABLET | Freq: Every day | ORAL | Status: DC

## 2013-12-08 MED ORDER — GLIPIZIDE ER 10 MG PO TB24: 10.0000 mg | ORAL_TABLET | Freq: Every day | ORAL | Status: DC

## 2013-12-08 MED ORDER — DILTIAZEM HCL ER 240 MG PO CP24: 240.0000 mg | ORAL_CAPSULE | Freq: Every morning | ORAL | Status: DC

## 2013-12-08 MED ORDER — WARFARIN SODIUM 6 MG PO TABS: ORAL_TABLET | ORAL | Status: DC

## 2013-12-10 ENCOUNTER — Other Ambulatory Visit: Payer: Self-pay | Admitting: *Deleted

## 2013-12-10 ENCOUNTER — Encounter: Payer: Self-pay | Admitting: Endocrinology

## 2013-12-10 MED ORDER — ATORVASTATIN CALCIUM 10 MG PO TABS: 10.0000 mg | ORAL_TABLET | Freq: Every day | ORAL | Status: DC

## 2013-12-12 ENCOUNTER — Other Ambulatory Visit: Payer: Self-pay | Admitting: *Deleted

## 2013-12-12 MED ORDER — ACCU-CHEK SMARTVIEW CONTROL VI LIQD: Status: DC

## 2013-12-12 MED ORDER — ACCU-CHEK FASTCLIX LANCET KIT: PACK | Status: DC

## 2013-12-12 MED ORDER — BD SWAB SINGLE USE REGULAR PADS: MEDICATED_PAD | Status: DC

## 2013-12-15 ENCOUNTER — Other Ambulatory Visit: Payer: Commercial Managed Care - HMO

## 2013-12-15 ENCOUNTER — Other Ambulatory Visit: Payer: Self-pay | Admitting: *Deleted

## 2013-12-15 MED ORDER — AMIODARONE HCL 200 MG PO TABS: ORAL_TABLET | ORAL | Status: DC

## 2013-12-16 ENCOUNTER — Other Ambulatory Visit (INDEPENDENT_AMBULATORY_CARE_PROVIDER_SITE_OTHER): Payer: Commercial Managed Care - HMO

## 2013-12-16 DIAGNOSIS — Z5181 Encounter for therapeutic drug level monitoring: Secondary | ICD-10-CM

## 2013-12-16 LAB — PROTIME-INR
INR: 2.5 ratio — ABNORMAL HIGH (ref 0.8–1.0)
Prothrombin Time: 26.6 s — ABNORMAL HIGH (ref 9.6–13.1)

## 2013-12-18 ENCOUNTER — Ambulatory Visit (INDEPENDENT_AMBULATORY_CARE_PROVIDER_SITE_OTHER): Payer: Commercial Managed Care - HMO | Admitting: Endocrinology

## 2013-12-18 ENCOUNTER — Encounter: Payer: Self-pay | Admitting: Endocrinology

## 2013-12-18 VITALS — BP 132/80 | HR 82 | Wt >= 6400 oz

## 2013-12-18 DIAGNOSIS — E1142 Type 2 diabetes mellitus with diabetic polyneuropathy: Secondary | ICD-10-CM

## 2013-12-18 DIAGNOSIS — Z6841 Body Mass Index (BMI) 40.0 and over, adult: Secondary | ICD-10-CM

## 2013-12-18 DIAGNOSIS — E1140 Type 2 diabetes mellitus with diabetic neuropathy, unspecified: Secondary | ICD-10-CM

## 2013-12-18 DIAGNOSIS — E1165 Type 2 diabetes mellitus with hyperglycemia: Secondary | ICD-10-CM

## 2013-12-18 DIAGNOSIS — IMO0002 Reserved for concepts with insufficient information to code with codable children: Secondary | ICD-10-CM

## 2013-12-18 DIAGNOSIS — I10 Essential (primary) hypertension: Secondary | ICD-10-CM

## 2013-12-18 NOTE — Progress Notes (Signed)
Reason for visit-  Craig Lindsey is a 53 y.o.-year-old male, here for management of Type 2 diabetes, uncontrolled, with complications ( stage 3 CKD, neuropathy). Last seen by me 2 weeks ago.   HPI- Patient has been diagnosed with diabetes in 2009. Recalls being initially on lifestyle modifications.  Tried  Metformin, Glipizide, Januvia. he has  been on insulin since 2 years.  Is in donut hole for past 3 months.    * taken off Januvia , was taking it for about 1 year, stopped 5 months due to cost * Off metformin due to CKD ( November 2015) *Lantus changed to Novolog mix 70/30 ( Nov 2015)  Pt is currently on a regimen of:  -Glipizide 10 mg daily am  - Novolog mix 70/30 at 30 units twice daily    Last hemoglobin A1c was: trending up after stopping Januvia.  Lab Results  Component Value Date   HGBA1C 9.1* 11/11/2013   HGBA1C 8.3* 03/10/2013   HGBA1C 8.2* 09/04/2012     Pt checks his sugars 3-4 a day . Uses  One touch mini glucometer. By meter download they are:  PREMEAL Breakfast Lunch Dinner Bedtime Overall  Glucose range: 241-370 295-400s 315-439 300-400s   Mean/median:        POST-MEAL PC Breakfast PC Lunch PC Dinner  Glucose range:     Mean/median:       Hypoglycemia-  No lows.   Dietary habits- eats three times daily. Has been trying to eat consistently at 2 times daily.Tries to limit carbs, sweetened beverages, sodas, desserts.  Exercise- trying to walk daily, and  Plans to start at [facility] water aerobics Weight - lost few pounds recently Wt Readings from Last 3 Encounters:  12/18/13 456 lb 12 oz (207.18 kg)  12/04/13 455 lb 4 oz (206.5 kg)  11/11/13 457 lb (207.294 kg)    Diabetes Complications-  Nephropathy- Yes, Stage3  CKD, last BUN/creatinine-  Lab Results  Component Value Date   BUN 31* 11/11/2013   CREATININE 1.9* 11/11/2013   Lab Results  Component Value Date   GFR 38.59* 11/11/2013   MICRALBCREAT 11.0 09/13/2010   No recent urine MA testing,  unable to give sample today   Retinopathy- No, Last DEE was in Feb 2015 Neuropathy- has numbness and tingling in his feet. Known neuropathy, also believed to be secondary to chemo Associated history - No CAD . Has hx afib and vtach s/p ICD and treated with amiodarone, coumadin. No prior stroke. No hypothyroidism. his last TSH was  Lab Results  Component Value Date   TSH 1.69 03/10/2013    Hyperlipidemia-  his last set of lipids were- Currently on Lipitor 10 mg daily. Tolerating well.  Tgs suboptimal.  Lab Results  Component Value Date   CHOL 140 03/10/2013   HDL 36.40* 03/10/2013   LDLCALC 54 02/16/2012   LDLDIRECT 78.3 03/10/2013   TRIG 275.0* 03/10/2013   CHOLHDL 4 03/10/2013    Blood Pressure/HTN- Patient's blood pressure is well controlled today on current regimen that includes ACE-I ( lisinopril).   I have reviewed the patient's past medical history, medications and allergies.    Current Outpatient Prescriptions on File Prior to Visit  Medication Sig Dispense Refill  . Alcohol Swabs (B-D SINGLE USE SWABS REGULAR) PADS Use to test blood sugar 3 times daily dx: E11.42 300 each 3  . amiodarone (PACERONE) 200 MG tablet TAKE 1 TABLET (200 MG TOTAL) BY MOUTH DAILY. 90 tablet 1  . atorvastatin (LIPITOR) 10 MG  tablet Take 1 tablet (10 mg total) by mouth daily at 6 PM. 90 tablet 3  . BD ULTRA-FINE PEN NEEDLES 29G X 12.7MM MISC     . Blood Glucose Calibration (ACCU-CHEK SMARTVIEW CONTROL) LIQD To use for calibration of glucose monitor dx: E11.42 1 each 0  . diltiazem (DILACOR XR) 240 MG 24 hr capsule Take 1 capsule (240 mg total) by mouth every morning. 90 capsule 3  . furosemide (LASIX) 40 MG tablet Take 1 tablet (40 mg total) by mouth every morning. 90 tablet 3  . glipiZIDE (GLUCOTROL XL) 10 MG 24 hr tablet Take 1 tablet (10 mg total) by mouth daily with breakfast. 90 tablet 3  . glucose blood (ONE TOUCH ULTRA TEST) test strip Use to test sugars three times daily 100 each 12  .  injection device for insulin (B-D PEN MINI) DEVI Use for insulin injections twice daily 60 each 11  . Insulin Aspart Prot & Aspart (NOVOLOG 70/30 MIX) (70-30) 100 UNIT/ML Pen Use 20 units twice daily ( with breakfast and supper) 15 mL 11  . Lancets Misc. (ACCU-CHEK FASTCLIX LANCET) KIT Use to check blood sugars 3 times daily dx: E11.42 1 kit 0  . lisinopril (PRINIVIL,ZESTRIL) 40 MG tablet Take 1 tablet (40 mg total) by mouth daily. 90 tablet 3  . metoprolol succinate (TOPROL-XL) 100 MG 24 hr tablet Take 2 tablets (200 mg total) by mouth daily. Take with or immediately following a meal. 180 tablet 3  . multivitamin (THERAGRAN) per tablet Take 1 tablet by mouth daily.      . NON FORMULARY Knee high compression hose 20-30 mmhg. Apply daily and remove at bedtime.     Marland Kitchen spironolactone (ALDACTONE) 50 MG tablet Take 1 tablet (50 mg total) by mouth 2 (two) times daily. 180 tablet 3  . warfarin (COUMADIN) 6 MG tablet TAKE 1 TABLET (6 MG TOTAL) BY MOUTH DAILY. 90 tablet 3   No current facility-administered medications on file prior to visit.   No Known Allergies   Review of Systems- [ x ]  Complains of    [  ]  denies [  ] Recent weight change [ x ]  Fatigue [ x ] polydipsia [  ] polyuria [  ]  nocturia [  ]  vision difficulty [  ] chest pain [ x ] shortness of breath [  ] leg swelling [x  ] cough [  ] nausea/vomiting [  ] diarrhea [ x ] constipation [  ] abdominal pain [  ]  tingling/numbness in extremities [  ]  concern with feet ( wounds/sores) Feeling more tired with recent higher sugars  PE: BP 132/80 mmHg  Pulse 82  Wt 456 lb 12 oz (207.18 kg)  SpO2 95% Wt Readings from Last 3 Encounters:  12/18/13 456 lb 12 oz (207.18 kg)  12/04/13 455 lb 4 oz (206.5 kg)  11/11/13 457 lb (207.294 kg)   Exam: deferred  ASSESSMENT AND PLAN: 1. Type 2 Diabetes, uncontrolled 2. HTN 3. Neuropathy 4. Morbid obesity  Problem List Items Addressed This Visit      Cardiovascular and Mediastinum    Essential hypertension - Primary    BP at target today. Update urine MA at next visit.         Endocrine   Type 2 diabetes, uncontrolled, with neuropathy    Last A1c well above target. We should aim for less stringent A1c goal  Of close to 7.5% in his case due to multiple  comorbidities.  He will continue to work on diet and exercise.   Recent higher sugars are likely due to withdrawal of metformin.  Change Novolog mix 70/30 to 36 units twice daily.  In another 2 days, if sugars are not better controlled- then he will start with 36+1 scale twice daily.  He can continue the Glipizide for now, but will re-evaluate need to continue this given that he will be on premixed insulin.  Update urine MA next visit.    RTC 7weeks          Nervous and Auditory   Diabetes, polyneuropathy    Neuropathy with tolerable symptoms . Continue regular self foot care.         Other   Morbid obesity with body mass index of 50.0-59.9 in adult    Encouraged portion control and carb control at this time.   He could be a candidate for gastric bypass surgery, and was contemplating it 5 years ago. He got diagnosed with colon cancer since then and hence didn't pursue it. His current insurance plan doesn't cover the surgery per his report.            - Return to clinic in  7 weeks with sugar log/meter.  [facility] 12/19/2013 6:41 PM

## 2013-12-18 NOTE — Patient Instructions (Addendum)
Check sugars 2 x daily ( before breakfast and before supper).  Record them in a log book and bring that/meter to next appointment.   Change insulin dosing as follows- Start following the insulin scale 2 days after increasing the dose of insulin to 36 units twice daily.   Blood Glucose (mg/dL)  Breakfast (Units Insulin 70/30)    Supper (Units Insulin 70/30)     <70 Treat the low blood sugar.  Recheck blood glucose  in 15 mins. If sugar is more than 70, then take the number of units of insulin in the 70-90 row, if before breakfast or supper.   70-90  22  22  91-130 (Base Dose)  36  36   131-150  37  37   151-200  38  38   201-250  39  39   251-300  40  40   301-350  41  41   351-400  42  42   401-450  43  43   >450  44  44   Please come back for a follow-up appointment in 7 weeks

## 2013-12-18 NOTE — Progress Notes (Signed)
Pre visit review using our clinic review tool, if applicable. No additional management support is needed unless otherwise documented below in the visit note. 

## 2013-12-19 ENCOUNTER — Other Ambulatory Visit: Payer: Self-pay | Admitting: *Deleted

## 2013-12-19 MED ORDER — ACCU-CHEK FASTCLIX LANCETS MISC: Status: DC

## 2013-12-19 NOTE — Assessment & Plan Note (Signed)
Encouraged portion control and carb control at this time.   He could be a candidate for gastric bypass surgery, and was contemplating it 5 years ago. He got diagnosed with colon cancer since then and hence didn't pursue it. His current insurance plan doesn't cover the surgery per his report.

## 2013-12-19 NOTE — Assessment & Plan Note (Signed)
Last A1c well above target. We should aim for less stringent A1c goal  Of close to 7.5% in his case due to multiple comorbidities.  He will continue to work on diet and exercise.   Recent higher sugars are likely due to withdrawal of metformin.  Change Novolog mix 70/30 to 36 units twice daily.  In another 2 days, if sugars are not better controlled- then he will start with 36+1 scale twice daily.  He can continue the Glipizide for now, but will re-evaluate need to continue this given that he will be on premixed insulin.  Update urine MA next visit.    RTC 7weeks

## 2013-12-19 NOTE — Assessment & Plan Note (Signed)
Neuropathy with tolerable symptoms . Continue regular self foot care.      

## 2013-12-19 NOTE — Assessment & Plan Note (Signed)
BP at target today. Update urine MA at next visit.

## 2013-12-22 ENCOUNTER — Other Ambulatory Visit: Payer: Self-pay | Admitting: *Deleted

## 2013-12-22 MED ORDER — GLUCOSE BLOOD VI STRP: ORAL_STRIP | Status: DC

## 2013-12-24 ENCOUNTER — Other Ambulatory Visit: Payer: Self-pay | Admitting: *Deleted

## 2013-12-24 MED ORDER — AMIODARONE HCL 200 MG PO TABS: ORAL_TABLET | ORAL | Status: DC

## 2013-12-25 ENCOUNTER — Encounter: Payer: Self-pay | Admitting: Endocrinology

## 2013-12-25 ENCOUNTER — Other Ambulatory Visit: Payer: Self-pay | Admitting: *Deleted

## 2013-12-25 MED ORDER — GLUCOSE BLOOD VI STRP: ORAL_STRIP | Status: DC

## 2013-12-25 NOTE — Telephone Encounter (Signed)
Dr Howell Rucks, I sent in the test strips for him.

## 2014-01-06 ENCOUNTER — Other Ambulatory Visit: Payer: Self-pay | Admitting: *Deleted

## 2014-01-06 NOTE — Telephone Encounter (Signed)
error 

## 2014-01-07 ENCOUNTER — Telehealth: Payer: Self-pay | Admitting: Cardiology

## 2014-01-07 NOTE — Telephone Encounter (Signed)
Pt called and requested if we have received transmission. I informed him that the last transmission that was received was on 07-2013. After asking the pt how he had monitor hooked up and found out that home monitor was hooked up correctly. I informed pt to call tech service to see if they could help him trouble shoot monitor.

## 2014-01-12 ENCOUNTER — Ambulatory Visit (INDEPENDENT_AMBULATORY_CARE_PROVIDER_SITE_OTHER): Payer: Commercial Managed Care - HMO | Admitting: Internal Medicine

## 2014-01-12 ENCOUNTER — Encounter: Payer: Self-pay | Admitting: Internal Medicine

## 2014-01-12 VITALS — BP 140/70 | HR 87 | Temp 98.1°F | Wt >= 6400 oz

## 2014-01-12 DIAGNOSIS — J0110 Acute frontal sinusitis, unspecified: Secondary | ICD-10-CM | POA: Insufficient documentation

## 2014-01-12 MED ORDER — AMOXICILLIN 500 MG PO TABS: 1000.0000 mg | ORAL_TABLET | Freq: Two times a day (BID) | ORAL | Status: DC

## 2014-01-12 NOTE — Assessment & Plan Note (Signed)
Probably still just viral---discussed Will continue supportive care---avoid cold meds though If worsens, start amoxil

## 2014-01-12 NOTE — Progress Notes (Signed)
Subjective:    Patient ID: Craig Lindsey, male    DOB: 1960-06-14, 53 y.o.   MRN: [id number]  HPI Here due to sinus problems  Did meet with Dr Howell Rucks Now having some success and sugars finally going down  Having chest congestion, rhinorrhea, cough, headache Trouble breathing while supine due to head and nasal congestion Has to sleep with head up but not sleeping well  Started about 6 days ago No fever No daytime SOB but gets sensation of racing heart Has taken tylenol cold---discussed avoiding decongestants  Does have frontal pressure Some mucus in back of throat--trouble getting it up Dark yellow mucus No ear pain  Also tried mucinex DM--may have helped some  Current Outpatient Prescriptions on File Prior to Visit  Medication Sig Dispense Refill  . ACCU-CHEK FASTCLIX LANCETS MISC check blood sugars 3 times daily dx: E11.42 200 each 1  . Alcohol Swabs (B-D SINGLE USE SWABS REGULAR) PADS Use to test blood sugar 3 times daily dx: E11.42 300 each 3  . amiodarone (PACERONE) 200 MG tablet TAKE 1 TABLET (200 MG TOTAL) BY MOUTH DAILY. 90 tablet 0  . atorvastatin (LIPITOR) 10 MG tablet Take 1 tablet (10 mg total) by mouth daily at 6 PM. 90 tablet 3  . BD ULTRA-FINE PEN NEEDLES 29G X 12.7MM MISC     . Blood Glucose Calibration (ACCU-CHEK SMARTVIEW CONTROL) LIQD To use for calibration of glucose monitor dx: E11.42 1 each 0  . diltiazem (DILACOR XR) 240 MG 24 hr capsule Take 1 capsule (240 mg total) by mouth every morning. 90 capsule 3  . furosemide (LASIX) 40 MG tablet Take 1 tablet (40 mg total) by mouth every morning. 90 tablet 3  . glipiZIDE (GLUCOTROL XL) 10 MG 24 hr tablet Take 1 tablet (10 mg total) by mouth daily with breakfast. 90 tablet 3  . glucose blood (ACCU-CHEK SMARTVIEW) test strip Check 3 times daily as instructed. 300 each 3  . injection device for insulin (B-D PEN MINI) DEVI Use for insulin injections twice daily 60 each 11  . Insulin Aspart Prot & Aspart (NOVOLOG  70/30 MIX) (70-30) 100 UNIT/ML Pen Use 20 units twice daily ( with breakfast and supper) 15 mL 11  . Lancets Misc. (ACCU-CHEK FASTCLIX LANCET) KIT Use to check blood sugars 3 times daily dx: E11.42 1 kit 0  . lisinopril (PRINIVIL,ZESTRIL) 40 MG tablet Take 1 tablet (40 mg total) by mouth daily. 90 tablet 3  . metoprolol succinate (TOPROL-XL) 100 MG 24 hr tablet Take 2 tablets (200 mg total) by mouth daily. Take with or immediately following a meal. 180 tablet 3  . multivitamin (THERAGRAN) per tablet Take 1 tablet by mouth daily.      . NON FORMULARY Knee high compression hose 20-30 mmhg. Apply daily and remove at bedtime.     Marland Kitchen spironolactone (ALDACTONE) 50 MG tablet Take 1 tablet (50 mg total) by mouth 2 (two) times daily. 180 tablet 3  . warfarin (COUMADIN) 6 MG tablet TAKE 1 TABLET (6 MG TOTAL) BY MOUTH DAILY. 90 tablet 3   No current facility-administered medications on file prior to visit.    No Known Allergies  Past Medical History  Diagnosis Date  . Diabetes mellitus     Type II  . Hyperlipidemia   . Hypertension   . Arrhythmia 12/04    1. Ventricular tachycardia; Dr. [name]  2. Atrial fibrillation  . Obesity   . Hemorrhoids     hx of  . Personal  history of colon cancer, stage III 1/09    Dr. Carlean Purl, Benay Spice, D. Newman  . Neuropathy     Oxaliplatin  . DVT (deep venous thrombosis) 7/09    RLE, Tx Arixtra  . Sigmoid colon ulcer 5 yrs ago    Sigmoid; Nydia Bouton, MD (colon cancer)  . Sleep apnea     uses cpap setting of 10  . Chronic kidney disease     Complex L renal cyst, no tx for   . Umbilical hernia     Past Surgical History  Procedure Laterality Date  . Cardiac defibrillator placement    . Colectomy  1/09    Sigmoid; Nydia Bouton, MD (colon cancer)  . Cholecystectomy    . Colonoscopy    . Hernia repair  yrs ago    umb hernia  . Colonoscopy N/A 02/25/2013    Procedure: COLONOSCOPY;  Surgeon: Gatha Mayer, MD;  Location: [facility];  Service: Endoscopy;   Laterality: N/A;    Family History  Problem Relation Age of Onset  . Diabetes Father   . Diabetes Maternal Grandmother   . Hypertension Neg Hx   . Coronary artery disease Neg Hx   . Prostate cancer Neg Hx   . Colon cancer Other   . Cancer Maternal Aunt     colon    History   Social History  . Marital Status: Married    Spouse Name: N/A    Number of Children: 2  . Years of Education: N/A   Occupational History  . [employer]     Disabled   Social History Main Topics  . Smoking status: Never Smoker   . Smokeless tobacco: Never Used  . Alcohol Use: No  . Drug Use: No  . Sexual Activity: Yes   Other Topics Concern  . Not on file   Social History Narrative   2 step children from wife's 2nd marriage.   Review of Systems  No rash No vomiting or diarrhea Appetite is okay     Objective:   Physical Exam  Constitutional: He appears well-developed and well-nourished. No distress.  HENT:  Mouth/Throat: Oropharynx is clear and moist. No oropharyngeal exudate.  No sinus tenderness TMs normal Mild nasal inflammation ---some green mucus on right  Neck: Normal range of motion. Neck supple. No thyromegaly present.  Pulmonary/Chest: Effort normal and breath sounds normal. No respiratory distress. He has no wheezes. He has no rales.  Lymphadenopathy:    He has no cervical adenopathy.          Assessment & Plan:

## 2014-01-18 ENCOUNTER — Encounter: Payer: Self-pay | Admitting: Endocrinology

## 2014-01-19 ENCOUNTER — Other Ambulatory Visit: Payer: Self-pay | Admitting: *Deleted

## 2014-01-19 DIAGNOSIS — E1165 Type 2 diabetes mellitus with hyperglycemia: Principal | ICD-10-CM

## 2014-01-19 DIAGNOSIS — E1140 Type 2 diabetes mellitus with diabetic neuropathy, unspecified: Secondary | ICD-10-CM

## 2014-01-19 DIAGNOSIS — IMO0002 Reserved for concepts with insufficient information to code with codable children: Secondary | ICD-10-CM

## 2014-01-19 MED ORDER — INSULIN ASPART PROT & ASPART (70-30 MIX) 100 UNIT/ML PEN: PEN_INJECTOR | SUBCUTANEOUS | Status: DC

## 2014-02-03 ENCOUNTER — Ambulatory Visit (INDEPENDENT_AMBULATORY_CARE_PROVIDER_SITE_OTHER): Payer: Commercial Managed Care - HMO | Admitting: Internal Medicine

## 2014-02-03 ENCOUNTER — Encounter: Payer: Self-pay | Admitting: Internal Medicine

## 2014-02-03 VITALS — BP 108/72 | HR 76 | Ht 76.0 in | Wt >= 6400 oz

## 2014-02-03 DIAGNOSIS — Z95810 Presence of automatic (implantable) cardiac defibrillator: Secondary | ICD-10-CM

## 2014-02-03 DIAGNOSIS — I4729 Other ventricular tachycardia: Secondary | ICD-10-CM

## 2014-02-03 DIAGNOSIS — I5032 Chronic diastolic (congestive) heart failure: Secondary | ICD-10-CM

## 2014-02-03 DIAGNOSIS — I472 Ventricular tachycardia, unspecified: Secondary | ICD-10-CM

## 2014-02-03 DIAGNOSIS — I10 Essential (primary) hypertension: Secondary | ICD-10-CM | POA: Diagnosis not present

## 2014-02-03 DIAGNOSIS — I480 Paroxysmal atrial fibrillation: Secondary | ICD-10-CM | POA: Diagnosis not present

## 2014-02-03 LAB — MDC_IDC_ENUM_SESS_TYPE_INCLINIC
Brady Statistic RV Percent Paced: 0.05 %
Date Time Interrogation Session: 20160119135421
HIGH POWER IMPEDANCE MEASURED VALUE: 190 Ohm
HIGH POWER IMPEDANCE MEASURED VALUE: 53 Ohm
HighPow Impedance: 456 Ohm
HighPow Impedance: 68 Ohm
Lead Channel Pacing Threshold Amplitude: 0.75 V
Lead Channel Pacing Threshold Pulse Width: 0.4 ms
Lead Channel Sensing Intrinsic Amplitude: 9.875 mV
Lead Channel Setting Pacing Pulse Width: 0.4 ms
Lead Channel Setting Sensing Sensitivity: 0.3 mV
MDC IDC MSMT BATTERY VOLTAGE: 3.01 V
MDC IDC MSMT LEADCHNL RV IMPEDANCE VALUE: 551 Ohm
MDC IDC MSMT LEADCHNL RV SENSING INTR AMPL: 8.875 mV
MDC IDC SET LEADCHNL RV PACING AMPLITUDE: 2.5 V
MDC IDC SET ZONE DETECTION INTERVAL: 300 ms
MDC IDC SET ZONE DETECTION INTERVAL: 450 ms
Zone Setting Detection Interval: 340 ms

## 2014-02-03 MED ORDER — FUROSEMIDE 40 MG PO TABS: ORAL_TABLET | ORAL | Status: DC

## 2014-02-03 NOTE — Assessment & Plan Note (Signed)
He appears to be maintaining NSR on low dose amiodarone.

## 2014-02-03 NOTE — Assessment & Plan Note (Signed)
Despite massive obesity, his blood pressure is well controlled. I have encouraged the patient to lose weight.

## 2014-02-03 NOTE — Progress Notes (Signed)
HPI [name] returns today for followup. He is a 54 year old man with morbid obesity, paroxysmal atrial fibrillation, ventricular fibrillation, status post ICD implantation. The patient has a history of colon cancer, status post resection and chemotherapy. He has been cancer free for over five years. He has had negative colonoscopies. He does not have much in the way of heart failure symptoms, class II diastolic although his fluid index is elevated.  He has modest peripheral edema. No syncope or recent ICD shock. No Known Allergies   Current Outpatient Prescriptions  Medication Sig Dispense Refill  . ACCU-CHEK FASTCLIX LANCETS MISC check blood sugars 3 times daily dx: E11.42 200 each 1  . Alcohol Swabs (B-D SINGLE USE SWABS REGULAR) PADS Use to test blood sugar 3 times daily dx: E11.42 300 each 3  . amiodarone (PACERONE) 200 MG tablet TAKE 1 TABLET (200 MG TOTAL) BY MOUTH DAILY. 90 tablet 0  . atorvastatin (LIPITOR) 10 MG tablet Take 1 tablet (10 mg total) by mouth daily at 6 PM. 90 tablet 3  . BD ULTRA-FINE PEN NEEDLES 29G X 12.7MM MISC     . Blood Glucose Calibration (ACCU-CHEK SMARTVIEW CONTROL) LIQD To use for calibration of glucose monitor dx: E11.42 1 each 0  . diltiazem (DILACOR XR) 240 MG 24 hr capsule Take 1 capsule (240 mg total) by mouth every morning. 90 capsule 3  . furosemide (LASIX) 40 MG tablet Take one tablet by mouth daily and as needed 135 tablet 3  . glipiZIDE (GLUCOTROL XL) 10 MG 24 hr tablet Take 1 tablet (10 mg total) by mouth daily with breakfast. 90 tablet 3  . glucose blood (ACCU-CHEK SMARTVIEW) test strip Check 3 times daily as instructed. 300 each 3  . injection device for insulin (B-D PEN MINI) DEVI Use for insulin injections twice daily 60 each 11  . Insulin Aspart Prot & Aspart (NOVOLOG 70/30 MIX) (70-30) 100 UNIT/ML Pen Use 36 units twice daily ( with breakfast and supper) (Patient taking differently: Inject 36 units into the skin twice daily (with breakfast and  supper)) 15 mL 11  . lisinopril (PRINIVIL,ZESTRIL) 40 MG tablet Take 1 tablet (40 mg total) by mouth daily. 90 tablet 3  . metoprolol succinate (TOPROL-XL) 100 MG 24 hr tablet Take 2 tablets (200 mg total) by mouth daily. Take with or immediately following a meal. 180 tablet 3  . multivitamin (THERAGRAN) per tablet Take 1 tablet by mouth daily.      . NON FORMULARY Knee high compression hose 20-30 mmhg. Apply daily and remove at bedtime.     Marland Kitchen spironolactone (ALDACTONE) 50 MG tablet Take 1 tablet (50 mg total) by mouth 2 (two) times daily. 180 tablet 3  . warfarin (COUMADIN) 6 MG tablet TAKE 1 TABLET (6 MG TOTAL) BY MOUTH DAILY. 90 tablet 3   No current facility-administered medications for this visit.     Past Medical History  Diagnosis Date  . Diabetes mellitus     Type II  . Hyperlipidemia   . Hypertension   . Arrhythmia 12/04    1. Ventricular tachycardia; Dr. [name]  2. Atrial fibrillation  . Obesity   . Hemorrhoids     hx of  . Personal history of colon cancer, stage III 1/09    Dr. Carron Brazen, D. Newman  . Neuropathy     Oxaliplatin  . DVT (deep venous thrombosis) 7/09    RLE, Tx Arixtra  . Sigmoid colon ulcer 5 yrs ago    Sigmoid; Nydia Bouton,  MD (colon cancer)  . Sleep apnea     uses cpap setting of 10  . Chronic kidney disease     Complex L renal cyst, no tx for   . Umbilical hernia     ROS:   All systems reviewed and negative except as noted in the HPI.   Past Surgical History  Procedure Laterality Date  . Cardiac defibrillator placement    . Colectomy  1/09    Sigmoid; Nydia Bouton, MD (colon cancer)  . Cholecystectomy    . Colonoscopy    . Hernia repair  yrs ago    umb hernia  . Colonoscopy N/A 02/25/2013    Procedure: COLONOSCOPY;  Surgeon: Gatha Mayer, MD;  Location: [facility];  Service: Endoscopy;  Laterality: N/A;     Family History  Problem Relation Age of Onset  . Diabetes Father   . Diabetes Maternal Grandmother   . Hypertension  Neg Hx   . Coronary artery disease Neg Hx   . Prostate cancer Neg Hx   . Colon cancer Other   . Cancer Maternal Aunt     colon     History   Social History  . Marital Status: Married    Spouse Name: N/A    Number of Children: 2  . Years of Education: N/A   Occupational History  . [employer]     Disabled   Social History Main Topics  . Smoking status: Never Smoker   . Smokeless tobacco: Never Used  . Alcohol Use: No  . Drug Use: No  . Sexual Activity: Yes   Other Topics Concern  . Not on file   Social History Narrative   2 step children from wife's 2nd marriage.     BP 108/72 mmHg  Pulse 76  Ht 6\' 4"  (1.93 m)  Wt 452 lb (205.026 kg)  BMI 55.04 kg/m2  Physical Exam:  Well appearing morbidly obese, middle-aged man, NAD HEENT: Unremarkable Neck:   7 cm JVD, no thyromegally Lungs:  Clear with no wheezes, rales, or rhonchi. HEART:  Regular rate rhythm, no murmurs, no rubs, no clicks, distant heart sounds. Abd:  soft, obese, positive bowel sounds, no organomegally, no rebound, no guarding Ext:  2 plus pulses, 1+ peripheral edema, no cyanosis, no clubbing Skin:  No rashes no nodules Neuro:  CN II through XII intact, motor grossly intact  DEVICE  Normal device function.  See PaceArt for details.   Assess/Plan:

## 2014-02-03 NOTE — Assessment & Plan Note (Signed)
He has had no recurrent ventricular arrhythmias. Will follow.  

## 2014-02-03 NOTE — Assessment & Plan Note (Signed)
His Medtronic device is working normally. Will recheck in several months. 

## 2014-02-03 NOTE — Assessment & Plan Note (Signed)
His symptoms are class 2 but difficult to assess because of obesity. I have asked the patient to take an additional lasix for 3-4 days as his optivol is elevated for no other reason.

## 2014-02-03 NOTE — Patient Instructions (Addendum)
Remote monitoring is used to monitor your  ICD from home. This monitoring reduces the number of office visits required to check your device to one time per year. It allows [place] to keep an eye on the functioning of your device to ensure it is working properly. You are scheduled for a device check from home on 05-05-2014. You may send your transmission at any time that day. If you have a wireless device, the transmission will be sent automatically. After your physician reviews your transmission, you will receive a postcard with your next transmission date.  Your physician recommends that you schedule a follow-up appointment in: 12 months with Dr.Taylor  Your physician has recommended you make the following change in your medication:  Increase Furosemide to 1 tablet twice daily for 3 days then go back to original dosing

## 2014-02-05 ENCOUNTER — Ambulatory Visit: Payer: Commercial Managed Care - HMO | Admitting: Endocrinology

## 2014-02-18 ENCOUNTER — Ambulatory Visit: Payer: Commercial Managed Care - HMO | Admitting: Endocrinology

## 2014-02-19 ENCOUNTER — Ambulatory Visit (INDEPENDENT_AMBULATORY_CARE_PROVIDER_SITE_OTHER): Payer: Commercial Managed Care - HMO | Admitting: Endocrinology

## 2014-02-19 ENCOUNTER — Encounter: Payer: Self-pay | Admitting: Endocrinology

## 2014-02-19 VITALS — BP 128/62 | HR 74 | Resp 16 | Ht 76.0 in | Wt >= 6400 oz

## 2014-02-19 DIAGNOSIS — E1121 Type 2 diabetes mellitus with diabetic nephropathy: Secondary | ICD-10-CM

## 2014-02-19 DIAGNOSIS — I10 Essential (primary) hypertension: Secondary | ICD-10-CM | POA: Diagnosis not present

## 2014-02-19 DIAGNOSIS — E1165 Type 2 diabetes mellitus with hyperglycemia: Secondary | ICD-10-CM | POA: Diagnosis not present

## 2014-02-19 DIAGNOSIS — E1140 Type 2 diabetes mellitus with diabetic neuropathy, unspecified: Secondary | ICD-10-CM | POA: Diagnosis not present

## 2014-02-19 DIAGNOSIS — E785 Hyperlipidemia, unspecified: Secondary | ICD-10-CM | POA: Diagnosis not present

## 2014-02-19 DIAGNOSIS — N183 Chronic kidney disease, stage 3 unspecified: Secondary | ICD-10-CM

## 2014-02-19 DIAGNOSIS — IMO0002 Reserved for concepts with insufficient information to code with codable children: Secondary | ICD-10-CM

## 2014-02-19 MED ORDER — ACCU-CHEK NANO SMARTVIEW W/DEVICE KIT: 1.0000 | PACK | Freq: Once | Status: DC

## 2014-02-19 MED ORDER — INSULIN ASPART PROT & ASPART (70-30 MIX) 100 UNIT/ML PEN: PEN_INJECTOR | SUBCUTANEOUS | Status: DC

## 2014-02-19 NOTE — Progress Notes (Signed)
Reason for visit-  Craig Lindsey is a 54 y.o.-year-old male, here for management of Type 2 diabetes, uncontrolled, with complications ( stage 3 CKD, neuropathy). Last seen by me 6 weeks ago.   HPI- Patient has been diagnosed with diabetes in 2009. Recalls being initially on lifestyle modifications.  Tried  Metformin, Glipizide, Januvia. he has  been on insulin since 2 years.  was in donut hole for end 2015.   * taken off Januvia , was taking it for about 1 year, stopped 5 months due to cost 2015 * Off metformin due to CKD ( November 2015) *Lantus changed to Novolog mix 70/30 ( Nov 2015)  Pt is currently on a regimen of:  -Glipizide 10 mg daily am  - Novolog mix 70/30 at 36+1 units twice daily~ needing 42 twice daily    Last hemoglobin A1c was: trending up after stopping Januvia.  Lab Results  Component Value Date   HGBA1C 9.1* 11/11/2013   HGBA1C 8.3* 03/10/2013   HGBA1C 8.2* 09/04/2012     Pt checks his sugars 3-4 a day . Uses  One touch mini glucometer. By meter download they are:  PREMEAL Breakfast Lunch Dinner Bedtime Overall  Glucose range: 173-240 171-312 214-299    Mean/median:        POST-MEAL PC Breakfast PC Lunch PC Dinner  Glucose range:     Mean/median:       Hypoglycemia-  No lows.   Dietary habits- eats three times daily. Has been trying to eat consistently at 2 times daily.Tries to limit carbs, sweetened beverages, sodas, desserts.  Exercise- trying to walk daily, and  Plans to start at [facility] water aerobics- didn't get in this time Weight - stable now after recent loss of weight  Wt Readings from Last 3 Encounters:  02/19/14 451 lb 12 oz (204.912 kg)  02/03/14 452 lb (205.026 kg)  01/12/14 450 lb (204.119 kg)    Diabetes Complications-  Nephropathy- Yes, Stage3  CKD, last BUN/creatinine-  Lab Results  Component Value Date   BUN 31* 11/11/2013   CREATININE 1.9* 11/11/2013   Lab Results  Component Value Date   GFR 38.59* 11/11/2013   MICRALBCREAT 11.0 09/13/2010    Retinopathy- No, Last DEE was in Feb 2015 Neuropathy- has numbness and tingling in his feet. Known neuropathy, also believed to be secondary to chemo Associated history - No CAD . Has hx afib and vtach s/p ICD and treated with amiodarone, coumadin. No prior stroke. No hypothyroidism. his last TSH was  Lab Results  Component Value Date   TSH 1.69 03/10/2013    Hyperlipidemia-  his last set of lipids were- Currently on Lipitor 10 mg daily. Tolerating well.  Tgs suboptimal. Has annual exam with dr [name] this May.  Lab Results  Component Value Date   CHOL 140 03/10/2013   HDL 36.40* 03/10/2013   LDLCALC 54 02/16/2012   LDLDIRECT 78.3 03/10/2013   TRIG 275.0* 03/10/2013   CHOLHDL 4 03/10/2013    Blood Pressure/HTN- Patient's blood pressure is well controlled today on current regimen that includes ACE-I ( lisinopril).   I have reviewed the patient's past medical history, medications and allergies.    Current Outpatient Prescriptions on File Prior to Visit  Medication Sig Dispense Refill  . ACCU-CHEK FASTCLIX LANCETS MISC check blood sugars 3 times daily dx: E11.42 200 each 1  . Alcohol Swabs (B-D SINGLE USE SWABS REGULAR) PADS Use to test blood sugar 3 times daily dx: E11.42 300 each 3  .  amiodarone (PACERONE) 200 MG tablet TAKE 1 TABLET (200 MG TOTAL) BY MOUTH DAILY. 90 tablet 0  . atorvastatin (LIPITOR) 10 MG tablet Take 1 tablet (10 mg total) by mouth daily at 6 PM. 90 tablet 3  . BD ULTRA-FINE PEN NEEDLES 29G X 12.7MM MISC     . Blood Glucose Calibration (ACCU-CHEK SMARTVIEW CONTROL) LIQD To use for calibration of glucose monitor dx: E11.42 1 each 0  . diltiazem (DILACOR XR) 240 MG 24 hr capsule Take 1 capsule (240 mg total) by mouth every morning. 90 capsule 3  . furosemide (LASIX) 40 MG tablet Take one tablet by mouth daily and as needed 135 tablet 3  . glipiZIDE (GLUCOTROL XL) 10 MG 24 hr tablet Take 1 tablet (10 mg total) by mouth daily with  breakfast. 90 tablet 3  . glucose blood (ACCU-CHEK SMARTVIEW) test strip Check 3 times daily as instructed. 300 each 3  . injection device for insulin (B-D PEN MINI) DEVI Use for insulin injections twice daily 60 each 11  . lisinopril (PRINIVIL,ZESTRIL) 40 MG tablet Take 1 tablet (40 mg total) by mouth daily. 90 tablet 3  . metoprolol succinate (TOPROL-XL) 100 MG 24 hr tablet Take 2 tablets (200 mg total) by mouth daily. Take with or immediately following a meal. 180 tablet 3  . multivitamin (THERAGRAN) per tablet Take 1 tablet by mouth daily.      . NON FORMULARY Knee high compression hose 20-30 mmhg. Apply daily and remove at bedtime.     Marland Kitchen spironolactone (ALDACTONE) 50 MG tablet Take 1 tablet (50 mg total) by mouth 2 (two) times daily. 180 tablet 3  . warfarin (COUMADIN) 6 MG tablet TAKE 1 TABLET (6 MG TOTAL) BY MOUTH DAILY. 90 tablet 3   No current facility-administered medications on file prior to visit.   No Known Allergies   Review of Systems- [ x ]  Complains of    [  ]  denies [  ] Recent weight change [  ]  Fatigue [  ] polydipsia [  ] polyuria [  ]  nocturia [  ]  vision difficulty [  ] chest pain [  ] shortness of breath [  ] leg swelling [  ] cough [  ] nausea/vomiting [  ] diarrhea [  ] constipation [  ] abdominal pain [  ]  tingling/numbness in extremities [  ]  concern with feet ( wounds/sores)  PE: BP 128/62 mmHg  Pulse 74  Resp 16  Ht '6\' 4"'  (1.93 m)  Wt 451 lb 12 oz (204.912 kg)  BMI 55.01 kg/m2  SpO2 95% Wt Readings from Last 3 Encounters:  02/19/14 451 lb 12 oz (204.912 kg)  02/03/14 452 lb (205.026 kg)  01/12/14 450 lb (204.119 kg)   Exam: deferred  ASSESSMENT AND PLAN:   Problem List Items Addressed This Visit      Cardiovascular and Mediastinum   Essential hypertension    BP at target on current regimen. Update urine MA at this visit.         Endocrine   Type 2 diabetes, uncontrolled, with neuropathy - Primary    Last A1c well above target. We  should aim for less stringent A1c goal  Of close to 7.5% in his case due to multiple comorbidities. Update today.  He will continue to work on diet and exercise.    Change Novolog mix 70/30 to 46+2 BF and 44+2 with supper.  Notify if start to develop lows.  He can continue the Glipizide for now, but will re-evaluate need to continue this given that he will be on premixed insulin.  He wishes to continue with current therapy for now and not change to basal/bolus insulin   RTC 6weeks            Relevant Medications   Blood Glucose Monitoring Suppl (ACCU-CHEK NANO SMARTVIEW) W/DEVICE KIT   insulin aspart protamine - aspart (NOVOLOG 70/30 MIX) (70-30) 100 UNIT/ML FlexPen   Other Relevant Orders   Comprehensive metabolic panel   Hemoglobin A1c   Microalbumin / creatinine urine ratio     Genitourinary   Diabetic nephropathy associated with type 2 diabetes mellitus    Update urine MA and GFR this visit.       Relevant Medications   insulin aspart protamine - aspart (NOVOLOG 70/30 MIX) (70-30) 100 UNIT/ML FlexPen   Chronic kidney disease, stage III (moderate)    Recent Cr stable and c/w stage 3 CKD  Update today        Other   Hyperlipidemia    Last LDL at target on current statin. TGs suboptimal -expect improvement with better sugars.   Non fasting today. He will follow up with pcp in May 2016 for annual testing.            - Return to clinic in   6 weeks with sugar log/meter.  Alailah Safley [facility] 02/19/2014 3:06 PM

## 2014-02-19 NOTE — Assessment & Plan Note (Signed)
BP at target on current regimen. Update urine MA at this visit.

## 2014-02-19 NOTE — Assessment & Plan Note (Signed)
Update urine MA and GFR this visit.

## 2014-02-19 NOTE — Assessment & Plan Note (Signed)
Recent Cr stable and c/w stage 3 CKD  Update today

## 2014-02-19 NOTE — Assessment & Plan Note (Signed)
Last A1c well above target. We should aim for less stringent A1c goal  Of close to 7.5% in his case due to multiple comorbidities. Update today.  He will continue to work on diet and exercise.    Change Novolog mix 70/30 to 46+2 BF and 44+2 with supper.  Notify if start to develop lows.  He can continue the Glipizide for now, but will re-evaluate need to continue this given that he will be on premixed insulin.  He wishes to continue with current therapy for now and not change to basal/bolus insulin   RTC 6weeks

## 2014-02-19 NOTE — Progress Notes (Signed)
Pre visit review using our clinic review tool, if applicable. No additional management support is needed unless otherwise documented below in the visit note. 

## 2014-02-19 NOTE — Patient Instructions (Signed)
Check sugars 2 x daily ( before breakfast and before supper).  Record them in a log book and bring that/meter to next appointment.   Labs today. Notify me if start to have lows on this scale.  Please come back for a follow-up appointment in 6 weeks  Change insulin scale as follows-   Blood Glucose (mg/dL)  Breakfast (Units Insulin 70/30)    Supper (Units Insulin 70/30)     <70 Treat the low blood sugar.  Recheck blood glucose  in 15 mins. If sugar is more than 70, then take the number of units of insulin in the 70-90 row, if before breakfast or supper.   70-90  28  26  91-130 (Base Dose)  46  44   131-150  48  46   151-200  50  48   201-250  52  50   251-300  54  52   301-350  56  54   351-400  58  56   401-450  60  58   >450  62  60

## 2014-02-19 NOTE — Assessment & Plan Note (Signed)
Last LDL at target on current statin. TGs suboptimal -expect improvement with better sugars.   Non fasting today. He will follow up with pcp in May 2016 for annual testing.

## 2014-02-20 ENCOUNTER — Other Ambulatory Visit: Payer: Self-pay | Admitting: Endocrinology

## 2014-02-20 DIAGNOSIS — E1121 Type 2 diabetes mellitus with diabetic nephropathy: Secondary | ICD-10-CM

## 2014-02-20 LAB — COMPREHENSIVE METABOLIC PANEL
ALBUMIN: 3.8 g/dL (ref 3.5–5.2)
ALK PHOS: 61 U/L (ref 39–117)
ALT: 45 U/L (ref 0–53)
AST: 28 U/L (ref 0–37)
BUN: 36 mg/dL — ABNORMAL HIGH (ref 6–23)
CO2: 23 mEq/L (ref 19–32)
Calcium: 9.8 mg/dL (ref 8.4–10.5)
Chloride: 105 mEq/L (ref 96–112)
Creatinine, Ser: 1.95 mg/dL — ABNORMAL HIGH (ref 0.40–1.50)
GFR: 38.33 mL/min — AB (ref >60.00)
GLUCOSE: 249 mg/dL — AB (ref 70–99)
Potassium: 5.4 mEq/L — ABNORMAL HIGH (ref 3.5–5.1)
SODIUM: 137 meq/L (ref 135–145)
Total Bilirubin: 0.4 mg/dL (ref 0.2–1.2)
Total Protein: 7.6 g/dL (ref 6.0–8.3)

## 2014-02-20 LAB — MICROALBUMIN / CREATININE URINE RATIO
Creatinine,U: 147.1 mg/dL
Microalb Creat Ratio: 0.9 mg/g (ref 0.0–30.0)
Microalb, Ur: 1.3 mg/dL (ref 0.0–1.9)

## 2014-02-20 LAB — HEMOGLOBIN A1C: HEMOGLOBIN A1C: 8.8 % — AB (ref 4.6–6.5)

## 2014-02-23 ENCOUNTER — Telehealth: Payer: Self-pay

## 2014-02-23 DIAGNOSIS — IMO0002 Reserved for concepts with insufficient information to code with codable children: Secondary | ICD-10-CM

## 2014-02-23 DIAGNOSIS — E1165 Type 2 diabetes mellitus with hyperglycemia: Principal | ICD-10-CM

## 2014-02-23 DIAGNOSIS — E1140 Type 2 diabetes mellitus with diabetic neuropathy, unspecified: Secondary | ICD-10-CM

## 2014-02-23 MED ORDER — INSULIN ASPART PROT & ASPART (70-30 MIX) 100 UNIT/ML PEN: PEN_INJECTOR | SUBCUTANEOUS | Status: DC

## 2014-02-23 NOTE — Telephone Encounter (Signed)
Pharmacy needs clarification on how much novolog mix should be dispensed at a time. Rx should say 96ml with 2 refills. New Rx sent to pharmacy

## 2014-02-26 MED ORDER — INSULIN ASPART PROT & ASPART (70-30 MIX) 100 UNIT/ML PEN: PEN_INJECTOR | SUBCUTANEOUS | Status: DC

## 2014-02-26 NOTE — Addendum Note (Signed)
Addended by: Geni Bers on: 02/26/2014 02:57 PM   Modules accepted: Orders

## 2014-02-26 NOTE — Telephone Encounter (Signed)
90 day supply sent to pharmacy as requested and approved by Dr. Howell Rucks.

## 2014-02-27 ENCOUNTER — Other Ambulatory Visit (INDEPENDENT_AMBULATORY_CARE_PROVIDER_SITE_OTHER): Payer: Commercial Managed Care - HMO

## 2014-02-27 DIAGNOSIS — E1121 Type 2 diabetes mellitus with diabetic nephropathy: Secondary | ICD-10-CM | POA: Diagnosis not present

## 2014-02-27 LAB — BASIC METABOLIC PANEL
BUN: 39 mg/dL — AB (ref 6–23)
CHLORIDE: 103 meq/L (ref 96–112)
CO2: 29 meq/L (ref 19–32)
Calcium: 8.8 mg/dL (ref 8.4–10.5)
Creatinine, Ser: 1.87 mg/dL — ABNORMAL HIGH (ref 0.40–1.50)
GFR: 40.22 mL/min — ABNORMAL LOW (ref >60.00)
Glucose, Bld: 282 mg/dL — ABNORMAL HIGH (ref 70–99)
POTASSIUM: 4.5 meq/L (ref 3.5–5.1)
Sodium: 136 mEq/L (ref 135–145)

## 2014-03-02 ENCOUNTER — Other Ambulatory Visit: Payer: Self-pay | Admitting: *Deleted

## 2014-03-02 DIAGNOSIS — E1140 Type 2 diabetes mellitus with diabetic neuropathy, unspecified: Secondary | ICD-10-CM

## 2014-03-02 DIAGNOSIS — IMO0002 Reserved for concepts with insufficient information to code with codable children: Secondary | ICD-10-CM

## 2014-03-02 DIAGNOSIS — E1165 Type 2 diabetes mellitus with hyperglycemia: Principal | ICD-10-CM

## 2014-03-02 MED ORDER — INSULIN ASPART PROT & ASPART (70-30 MIX) 100 UNIT/ML PEN: PEN_INJECTOR | SUBCUTANEOUS | Status: DC

## 2014-03-17 ENCOUNTER — Other Ambulatory Visit: Payer: Self-pay | Admitting: *Deleted

## 2014-03-17 MED ORDER — INSULIN PEN NEEDLE 29G X 12.7MM MISC: Status: DC

## 2014-04-02 ENCOUNTER — Other Ambulatory Visit: Payer: Self-pay | Admitting: *Deleted

## 2014-04-02 ENCOUNTER — Ambulatory Visit (INDEPENDENT_AMBULATORY_CARE_PROVIDER_SITE_OTHER): Payer: Commercial Managed Care - HMO | Admitting: Endocrinology

## 2014-04-02 ENCOUNTER — Encounter: Payer: Self-pay | Admitting: Endocrinology

## 2014-04-02 VITALS — BP 128/62 | HR 76 | Resp 18 | Ht 76.0 in | Wt >= 6400 oz

## 2014-04-02 DIAGNOSIS — Z5181 Encounter for therapeutic drug level monitoring: Secondary | ICD-10-CM

## 2014-04-02 DIAGNOSIS — IMO0002 Reserved for concepts with insufficient information to code with codable children: Secondary | ICD-10-CM

## 2014-04-02 DIAGNOSIS — I10 Essential (primary) hypertension: Secondary | ICD-10-CM

## 2014-04-02 DIAGNOSIS — E1142 Type 2 diabetes mellitus with diabetic polyneuropathy: Secondary | ICD-10-CM

## 2014-04-02 DIAGNOSIS — E1165 Type 2 diabetes mellitus with hyperglycemia: Secondary | ICD-10-CM

## 2014-04-02 DIAGNOSIS — E1140 Type 2 diabetes mellitus with diabetic neuropathy, unspecified: Secondary | ICD-10-CM | POA: Diagnosis not present

## 2014-04-02 LAB — PROTIME-INR
INR: 2.4 ratio — AB (ref 0.8–1.0)
PROTHROMBIN TIME: 26.3 s — AB (ref 9.6–13.1)

## 2014-04-02 NOTE — Progress Notes (Signed)
Pre visit review using our clinic review tool, if applicable. No additional management support is needed unless otherwise documented below in the visit note. 

## 2014-04-02 NOTE — Patient Instructions (Signed)
Check sugars 2 x daily ( before breakfast and before supper).  Record them in a log book and bring that/meter to next appointment.  Change insulin scale as follows   Blood Glucose (mg/dL)  Breakfast (Units Insulin 70/30)    Supper (Units Insulin 70/30)     <70 Treat the low blood sugar.  Recheck blood glucose  in 15 mins. If sugar is more than 70, then take the number of units of insulin in the 70-90 row, if before breakfast or supper.   70-90  30  26  91-130 (Base Dose)  50  44   131-150  52  46   151-200  54  48   201-250  56  50   251-300  58  52   301-350  60  54   351-400  62  56   401-450  64  58   >450  66  60  Please come back for a follow-up appointment in 6 weeks

## 2014-04-02 NOTE — Assessment & Plan Note (Signed)
Neuropathy with tolerable symptoms . Continue regular self foot care.

## 2014-04-02 NOTE — Assessment & Plan Note (Signed)
BP at target on current regimen. normal urine MA feb 2016

## 2014-04-02 NOTE — Progress Notes (Signed)
Reason for visit-  Craig Lindsey is a 54 y.o.-year-old male, here for management of Type 2 diabetes, uncontrolled, with complications ( stage 3 CKD, neuropathy). Last seen by me 6 weeks ago.   HPI- Patient has been diagnosed with diabetes in 2009. Recalls being initially on lifestyle modifications.  Tried  Metformin, Glipizide, Januvia. he has  been on insulin since 2 years.  was in donut hole for end 2015.  *cost of medication now an issue as this insulin is costing around 1600 dollars per month  * taken off Januvia , was taking it for about 1 year, stopped 5 months due to cost 2015 * Off metformin due to CKD ( November 2015) *Lantus changed to Novolog mix 70/30 ( Nov 2015)  Pt is currently on a regimen of:  -Glipizide 10 mg daily am  - Novolog mix 70/30 at 46+2 BF and 44+2 supper    Last hemoglobin A1c was: Lab Results  Component Value Date   HGBA1C 8.8* 02/19/2014   HGBA1C 9.1* 11/11/2013   HGBA1C 8.3* 03/10/2013     Pt checks his sugars 3-4 a day . Uses  AccuChek nano glucometer. By meter download they are:  PREMEAL Breakfast Lunch Dinner Bedtime Overall  Glucose range: 126-196 169 101-250 213-304   Mean/median:        POST-MEAL PC Breakfast PC Lunch PC Dinner  Glucose range:     Mean/median:       Hypoglycemia-  No lows.   Dietary habits- eats three times daily. Has been trying to eat consistently at 2 times daily.Tries to limit carbs, sweetened beverages, sodas, desserts.  Exercise- trying to walk daily, and  Plans to start at [facility] water aerobics- didn't get in this time Weight -  Wt Readings from Last 3 Encounters:  04/02/14 462 lb 8 oz (209.789 kg)  02/19/14 451 lb 12 oz (204.912 kg)  02/03/14 452 lb (205.026 kg)    Diabetes Complications-  Nephropathy- Yes, Stage3  CKD, last BUN/creatinine-  Lab Results  Component Value Date   BUN 39* 02/27/2014   CREATININE 1.87* 02/27/2014   Lab Results  Component Value Date   GFR 40.22* 02/27/2014    MICRALBCREAT 0.9 02/19/2014    Retinopathy- No, Last DEE was in Feb 2015, due for this year-going to make appt Neuropathy- has numbness and tingling in his feet. Known neuropathy, also believed to be secondary to chemo Associated history - No CAD . Has hx afib and vtach s/p ICD and treated with amiodarone, coumadin. No prior stroke. No hypothyroidism. his last TSH was  Lab Results  Component Value Date   TSH 1.69 03/10/2013    Hyperlipidemia-  his last set of lipids were- Currently on Lipitor 10 mg daily. Tolerating well.  Tgs suboptimal. Has annual exam with dr [name] this May.  Lab Results  Component Value Date   CHOL 140 03/10/2013   HDL 36.40* 03/10/2013   LDLCALC 54 02/16/2012   LDLDIRECT 78.3 03/10/2013   TRIG 275.0* 03/10/2013   CHOLHDL 4 03/10/2013    Blood Pressure/HTN- Patient's blood pressure is well controlled today on current regimen that includes ACE-I ( lisinopril).   I have reviewed the patient's past medical history, medications and allergies.    Current Outpatient Prescriptions on File Prior to Visit  Medication Sig Dispense Refill  . ACCU-CHEK FASTCLIX LANCETS MISC check blood sugars 3 times daily dx: E11.42 200 each 1  . Alcohol Swabs (B-D SINGLE USE SWABS REGULAR) PADS Use to test blood sugar 3  times daily dx: E11.42 300 each 3  . amiodarone (PACERONE) 200 MG tablet TAKE 1 TABLET (200 MG TOTAL) BY MOUTH DAILY. 90 tablet 0  . atorvastatin (LIPITOR) 10 MG tablet Take 1 tablet (10 mg total) by mouth daily at 6 PM. 90 tablet 3  . Blood Glucose Calibration (ACCU-CHEK SMARTVIEW CONTROL) LIQD To use for calibration of glucose monitor dx: E11.42 1 each 0  . Blood Glucose Monitoring Suppl (ACCU-CHEK NANO SMARTVIEW) W/DEVICE KIT 1 kit by Does not apply route once. 1 kit 0  . diltiazem (DILACOR XR) 240 MG 24 hr capsule Take 1 capsule (240 mg total) by mouth every morning. 90 capsule 3  . furosemide (LASIX) 40 MG tablet Take one tablet by mouth daily and as needed 135  tablet 3  . glipiZIDE (GLUCOTROL XL) 10 MG 24 hr tablet Take 1 tablet (10 mg total) by mouth daily with breakfast. 90 tablet 3  . glucose blood (ACCU-CHEK SMARTVIEW) test strip Check 3 times daily as instructed. 300 each 3  . injection device for insulin (B-D PEN MINI) DEVI Use for insulin injections twice daily 60 each 11  . insulin aspart protamine - aspart (NOVOLOG 70/30 MIX) (70-30) 100 UNIT/ML FlexPen Use on sliding scale between 46-66 units twice daily ( with breakfast and supper) 75 mL 2  . Insulin Pen Needle (BD ULTRA-FINE PEN NEEDLES) 29G X 12.7MM MISC To use to check blood sugar daily 200 each 3  . lisinopril (PRINIVIL,ZESTRIL) 40 MG tablet Take 1 tablet (40 mg total) by mouth daily. 90 tablet 3  . metoprolol succinate (TOPROL-XL) 100 MG 24 hr tablet Take 2 tablets (200 mg total) by mouth daily. Take with or immediately following a meal. 180 tablet 3  . multivitamin (THERAGRAN) per tablet Take 1 tablet by mouth daily.      . NON FORMULARY Knee high compression hose 20-30 mmhg. Apply daily and remove at bedtime.     Marland Kitchen spironolactone (ALDACTONE) 50 MG tablet Take 1 tablet (50 mg total) by mouth 2 (two) times daily. 180 tablet 3  . warfarin (COUMADIN) 6 MG tablet TAKE 1 TABLET (6 MG TOTAL) BY MOUTH DAILY. 90 tablet 3   No current facility-administered medications on file prior to visit.   No Known Allergies   Review of Systems- [ x ]  Complains of    [  ]  denies [  ] Recent weight change [  ]  Fatigue [  ] polydipsia [  ] polyuria [  ]  nocturia [  ]  vision difficulty [  ] chest pain [  ] shortness of breath [  ] leg swelling [  ] cough [  ] nausea/vomiting [  ] diarrhea [ x ] constipation [  ] abdominal pain [  ]  tingling/numbness in extremities [  ]  concern with feet ( wounds/sores)  PE: BP 128/62 mmHg  Pulse 76  Resp 18  Ht '6\' 4"'  (1.93 m)  Wt 462 lb 8 oz (209.789 kg)  BMI 56.32 kg/m2  SpO2 94% Wt Readings from Last 3 Encounters:  04/02/14 462 lb 8 oz (209.789 kg)   02/19/14 451 lb 12 oz (204.912 kg)  02/03/14 452 lb (205.026 kg)   Exam: deferred  ASSESSMENT AND PLAN:   Problem List Items Addressed This Visit      Cardiovascular and Mediastinum   Essential hypertension    BP at target on current regimen. normal urine MA feb 2016  Endocrine   Type 2 diabetes, uncontrolled, with neuropathy - Primary    Last A1c well above target, but with improvement. We should aim for less stringent A1c goal  Of close to 7.5% in his case due to multiple comorbidities.  He will continue to work on diet and exercise. Suggested adding protein to BF and adding veggies to lunch and cutting back bread.  Improving morning time numbers, but evening numbers are still elevated.    Change Novolog mix 70/30 to 50+2 BF and 44+2 with supper.  Notify if start to develop lows.  He can continue the Glipizide for now, but will re-evaluate need to continue this given that he will be on premixed insulin.  He wishes to continue with current therapy for now and will inquire price for comparison of the various  basal/bolus insulin options. Doesn't want to move to Novolin 70/30 vial for now.    RTC 6weeks                Nervous and Auditory   Diabetes, polyneuropathy    Neuropathy with tolerable symptoms . Continue regular self foot care.             Other   Encounter for therapeutic drug monitoring     Patient requested INR to be drawn as he was past due. Labs ordered for Dr [name]- results to be routed to him.    - Return to clinic in   6 weeks with sugar log/meter.  Jinny Sweetland [facility] 04/02/2014 3:01 PM

## 2014-04-02 NOTE — Assessment & Plan Note (Signed)
Last A1c well above target, but with improvement. We should aim for less stringent A1c goal  Of close to 7.5% in his case due to multiple comorbidities.  He will continue to work on diet and exercise. Suggested adding protein to BF and adding veggies to lunch and cutting back bread.  Improving morning time numbers, but evening numbers are still elevated.    Change Novolog mix 70/30 to 50+2 BF and 44+2 with supper.  Notify if start to develop lows.  He can continue the Glipizide for now, but will re-evaluate need to continue this given that he will be on premixed insulin.  He wishes to continue with current therapy for now and will inquire price for comparison of the various  basal/bolus insulin options. Doesn't want to move to Novolin 70/30 vial for now.    RTC 6weeks

## 2014-04-14 ENCOUNTER — Encounter: Payer: Self-pay | Admitting: Endocrinology

## 2014-04-15 ENCOUNTER — Other Ambulatory Visit: Payer: Self-pay | Admitting: Endocrinology

## 2014-04-15 DIAGNOSIS — E1165 Type 2 diabetes mellitus with hyperglycemia: Principal | ICD-10-CM

## 2014-04-15 DIAGNOSIS — IMO0002 Reserved for concepts with insufficient information to code with codable children: Secondary | ICD-10-CM

## 2014-04-15 DIAGNOSIS — E1140 Type 2 diabetes mellitus with diabetic neuropathy, unspecified: Secondary | ICD-10-CM

## 2014-04-15 MED ORDER — INSULIN ASPART PROT & ASPART (70-30 MIX) 100 UNIT/ML PEN: PEN_INJECTOR | SUBCUTANEOUS | Status: DC

## 2014-05-05 ENCOUNTER — Telehealth: Payer: Self-pay | Admitting: Cardiology

## 2014-05-05 ENCOUNTER — Encounter: Payer: Commercial Managed Care - HMO | Admitting: *Deleted

## 2014-05-05 NOTE — Telephone Encounter (Signed)
Spoke with pt and reminded pt of remote transmission that is due today. Pt verbalized understanding.   

## 2014-05-06 ENCOUNTER — Encounter: Payer: Self-pay | Admitting: Cardiology

## 2014-05-08 ENCOUNTER — Other Ambulatory Visit: Payer: Commercial Managed Care - HMO

## 2014-05-12 ENCOUNTER — Other Ambulatory Visit (INDEPENDENT_AMBULATORY_CARE_PROVIDER_SITE_OTHER): Payer: Commercial Managed Care - HMO

## 2014-05-12 DIAGNOSIS — Z5181 Encounter for therapeutic drug level monitoring: Secondary | ICD-10-CM | POA: Diagnosis not present

## 2014-05-12 DIAGNOSIS — I82409 Acute embolism and thrombosis of unspecified deep veins of unspecified lower extremity: Secondary | ICD-10-CM | POA: Diagnosis not present

## 2014-05-12 DIAGNOSIS — Z7901 Long term (current) use of anticoagulants: Secondary | ICD-10-CM | POA: Diagnosis not present

## 2014-05-12 LAB — POCT INR: INR: 3.2

## 2014-05-13 ENCOUNTER — Other Ambulatory Visit: Payer: Self-pay | Admitting: Internal Medicine

## 2014-05-14 ENCOUNTER — Ambulatory Visit: Payer: Commercial Managed Care - HMO | Admitting: Endocrinology

## 2014-05-19 DIAGNOSIS — H5210 Myopia, unspecified eye: Secondary | ICD-10-CM | POA: Diagnosis not present

## 2014-05-19 DIAGNOSIS — H5213 Myopia, bilateral: Secondary | ICD-10-CM | POA: Diagnosis not present

## 2014-05-19 LAB — HM DIABETES EYE EXAM

## 2014-05-20 ENCOUNTER — Ambulatory Visit (INDEPENDENT_AMBULATORY_CARE_PROVIDER_SITE_OTHER): Payer: Commercial Managed Care - HMO | Admitting: *Deleted

## 2014-05-20 ENCOUNTER — Encounter: Payer: Self-pay | Admitting: Internal Medicine

## 2014-05-20 DIAGNOSIS — Z95810 Presence of automatic (implantable) cardiac defibrillator: Secondary | ICD-10-CM | POA: Diagnosis not present

## 2014-05-20 DIAGNOSIS — I5032 Chronic diastolic (congestive) heart failure: Secondary | ICD-10-CM

## 2014-05-21 ENCOUNTER — Telehealth: Payer: Self-pay | Admitting: Internal Medicine

## 2014-05-21 ENCOUNTER — Ambulatory Visit (INDEPENDENT_AMBULATORY_CARE_PROVIDER_SITE_OTHER): Payer: Commercial Managed Care - HMO | Admitting: Internal Medicine

## 2014-05-21 ENCOUNTER — Encounter: Payer: Self-pay | Admitting: Internal Medicine

## 2014-05-21 VITALS — BP 130/68 | HR 64 | Temp 97.9°F | Ht 76.0 in | Wt >= 6400 oz

## 2014-05-21 DIAGNOSIS — I5032 Chronic diastolic (congestive) heart failure: Secondary | ICD-10-CM

## 2014-05-21 DIAGNOSIS — E1140 Type 2 diabetes mellitus with diabetic neuropathy, unspecified: Secondary | ICD-10-CM

## 2014-05-21 DIAGNOSIS — Z6841 Body Mass Index (BMI) 40.0 and over, adult: Secondary | ICD-10-CM

## 2014-05-21 DIAGNOSIS — I10 Essential (primary) hypertension: Secondary | ICD-10-CM

## 2014-05-21 DIAGNOSIS — N183 Chronic kidney disease, stage 3 unspecified: Secondary | ICD-10-CM

## 2014-05-21 DIAGNOSIS — E1165 Type 2 diabetes mellitus with hyperglycemia: Secondary | ICD-10-CM

## 2014-05-21 DIAGNOSIS — Z125 Encounter for screening for malignant neoplasm of prostate: Secondary | ICD-10-CM

## 2014-05-21 DIAGNOSIS — I480 Paroxysmal atrial fibrillation: Secondary | ICD-10-CM

## 2014-05-21 DIAGNOSIS — Z0000 Encounter for general adult medical examination without abnormal findings: Secondary | ICD-10-CM

## 2014-05-21 DIAGNOSIS — IMO0002 Reserved for concepts with insufficient information to code with codable children: Secondary | ICD-10-CM

## 2014-05-21 LAB — HM DIABETES FOOT EXAM

## 2014-05-21 NOTE — Telephone Encounter (Signed)
New Message   Pt sent transmissino late last night wanted to make sure it was successful. Please call back and dsicuss.

## 2014-05-21 NOTE — Assessment & Plan Note (Signed)
BP Readings from Last 3 Encounters:  05/21/14 130/68  04/02/14 128/62  02/19/14 128/62   Good control No changes needed

## 2014-05-21 NOTE — Assessment & Plan Note (Signed)
Really working on lifestyle lately

## 2014-05-21 NOTE — Assessment & Plan Note (Signed)
Discussed PSA--will check UTD with colonoscopy Flu shot every fall--UTD otherwise

## 2014-05-21 NOTE — Assessment & Plan Note (Signed)
Has really improved and now with sig weight loss Will recheck

## 2014-05-21 NOTE — Progress Notes (Signed)
Subjective:    Patient ID: Craig Lindsey, male    DOB: July 25, 1960, 54 y.o.   MRN: [id number]  HPI Here for physical  Doing well Has lost 16# in the past few months Using Herbalife Trying to walk daily--10 minutes  No heart troubles No chest pain No palpitations No dizzy feelings or syncope  Sugars have been improving Actually 89 this morning No hypoglycemic reactions Just saw eye doctor Still with numb feet. Mild pain in feet but not bad enough to start a med  Still on disability Helps with 2 grandkids--living with them and step daughter (husband in [place])  Current Outpatient Prescriptions on File Prior to Visit  Medication Sig Dispense Refill  . ACCU-CHEK FASTCLIX LANCETS MISC check blood sugars 3 times daily dx: E11.42 200 each 1  . Alcohol Swabs (B-D SINGLE USE SWABS REGULAR) PADS Use to test blood sugar 3 times daily dx: E11.42 300 each 3  . amiodarone (PACERONE) 200 MG tablet TAKE 1 TABLET EVERY DAY 90 tablet 4  . atorvastatin (LIPITOR) 10 MG tablet Take 1 tablet (10 mg total) by mouth daily at 6 PM. 90 tablet 3  . Blood Glucose Calibration (ACCU-CHEK SMARTVIEW CONTROL) LIQD To use for calibration of glucose monitor dx: E11.42 1 each 0  . Blood Glucose Monitoring Suppl (ACCU-CHEK NANO SMARTVIEW) W/DEVICE KIT 1 kit by Does not apply route once. 1 kit 0  . diltiazem (DILACOR XR) 240 MG 24 hr capsule Take 1 capsule (240 mg total) by mouth every morning. 90 capsule 3  . furosemide (LASIX) 40 MG tablet Take one tablet by mouth daily and as needed 135 tablet 3  . glipiZIDE (GLUCOTROL XL) 10 MG 24 hr tablet Take 1 tablet (10 mg total) by mouth daily with breakfast. 90 tablet 3  . glucose blood (ACCU-CHEK SMARTVIEW) test strip Check 3 times daily as instructed. 300 each 3  . injection device for insulin (B-D PEN MINI) DEVI Use for insulin injections twice daily 60 each 11  . insulin aspart protamine - aspart (NOVOLOG 70/30 MIX) (70-30) 100 UNIT/ML FlexPen Use on sliding scale  between 50-70 units twice daily ( with breakfast and supper). Max daily dose 150 units 9 pen 2  . Insulin Pen Needle (BD ULTRA-FINE PEN NEEDLES) 29G X 12.7MM MISC To use to check blood sugar daily 200 each 3  . lisinopril (PRINIVIL,ZESTRIL) 40 MG tablet Take 1 tablet (40 mg total) by mouth daily. 90 tablet 3  . metoprolol succinate (TOPROL-XL) 100 MG 24 hr tablet Take 2 tablets (200 mg total) by mouth daily. Take with or immediately following a meal. 180 tablet 3  . multivitamin (THERAGRAN) per tablet Take 1 tablet by mouth daily.      . NON FORMULARY Knee high compression hose 20-30 mmhg. Apply daily and remove at bedtime.     Marland Kitchen spironolactone (ALDACTONE) 50 MG tablet Take 1 tablet (50 mg total) by mouth 2 (two) times daily. 180 tablet 3  . warfarin (COUMADIN) 6 MG tablet TAKE 1 TABLET (6 MG TOTAL) BY MOUTH DAILY. 90 tablet 3   No current facility-administered medications on file prior to visit.    No Known Allergies  Past Medical History  Diagnosis Date  . Diabetes mellitus     Type II  . Hyperlipidemia   . Hypertension   . Arrhythmia 12/04    1. Ventricular tachycardia; Dr. [name]  2. Atrial fibrillation  . Obesity   . Hemorrhoids     hx of  . Personal  history of colon cancer, stage III 1/09    Dr. Carlean Purl, Benay Spice, D. Newman  . Neuropathy     Oxaliplatin  . DVT (deep venous thrombosis) 7/09    RLE, Tx Arixtra  . Sigmoid colon ulcer 5 yrs ago    Sigmoid; Nydia Bouton, MD (colon cancer)  . Sleep apnea     uses cpap setting of 10  . Chronic kidney disease     Complex L renal cyst, no tx for   . Umbilical hernia     Past Surgical History  Procedure Laterality Date  . Cardiac defibrillator placement    . Colectomy  1/09    Sigmoid; Nydia Bouton, MD (colon cancer)  . Cholecystectomy    . Colonoscopy    . Hernia repair  yrs ago    umb hernia  . Colonoscopy N/A 02/25/2013    Procedure: COLONOSCOPY;  Surgeon: Gatha Mayer, MD;  Location: [facility];  Service: Endoscopy;   Laterality: N/A;    Family History  Problem Relation Age of Onset  . Diabetes Father   . Diabetes Maternal Grandmother   . Hypertension Neg Hx   . Coronary artery disease Neg Hx   . Prostate cancer Neg Hx   . Colon cancer Other   . Cancer Maternal Aunt     colon    History   Social History  . Marital Status: Married    Spouse Name: N/A  . Number of Children: 2  . Years of Education: N/A   Occupational History  . [employer]     Disabled   Social History Main Topics  . Smoking status: Never Smoker   . Smokeless tobacco: Never Used  . Alcohol Use: No  . Drug Use: No  . Sexual Activity: Yes   Other Topics Concern  . Not on file   Social History Narrative   2 step children from wife's 2nd marriage.   Review of Systems  Constitutional: Negative for fatigue.       Has lost weight Wears seat belt  HENT: Negative for dental problem, hearing loss and tinnitus.        Regular with dentist  Eyes: Negative for visual disturbance.  Respiratory: Negative for cough, chest tightness and shortness of breath.   Cardiovascular: Positive for leg swelling. Negative for chest pain and palpitations.       Right leg swelling is chronic  Gastrointestinal: Negative for nausea, vomiting, abdominal pain, constipation and blood in stool.  Endocrine: Negative for polydipsia and polyuria.  Genitourinary: Positive for difficulty urinating. Negative for urgency.       Occ dribble No sexual problems  Musculoskeletal: Negative for back pain, joint swelling and arthralgias.  Skin: Negative for rash.  Allergic/Immunologic: Negative for environmental allergies and immunocompromised state.  Neurological: Positive for weakness and numbness. Negative for dizziness, syncope, light-headedness and headaches.       Slight leg weakness  Hematological: Negative for adenopathy. Bruises/bleeds easily.  Psychiatric/Behavioral: Negative for sleep disturbance and dysphoric mood. The patient is not  nervous/anxious.        Objective:   Physical Exam  Constitutional: He appears well-developed. No distress.  HENT:  Head: Normocephalic and atraumatic.  Right Ear: External ear normal.  Left Ear: External ear normal.  Mouth/Throat: Oropharynx is clear and moist. No oropharyngeal exudate.  Eyes: Conjunctivae and EOM are normal. Pupils are equal, round, and reactive to light.  Neck: Normal range of motion. Neck supple. No thyromegaly present.  Cardiovascular: Normal rate, regular  rhythm, normal heart sounds and intact distal pulses.  Exam reveals no gallop.   No murmur heard. Pulmonary/Chest: Effort normal and breath sounds normal. No respiratory distress. He has no wheezes. He has no rales.  Abdominal: Soft. There is no tenderness.  Musculoskeletal:  1+ tense edema in calves and feet  Lymphadenopathy:    He has no cervical adenopathy.  Neurological:  Decreased sensation in feet  Skin: No rash noted. No erythema.  Slight callous left great toe--no other foot lesions  Psychiatric: He has a normal mood and affect. His behavior is normal.          Assessment & Plan:

## 2014-05-21 NOTE — Assessment & Plan Note (Signed)
On ACEI Will recheck 

## 2014-05-21 NOTE — Assessment & Plan Note (Signed)
Paced Low burden Remains on coumadin

## 2014-05-21 NOTE — Telephone Encounter (Signed)
Informed patient that remote was received. Patient voiced understanding. 

## 2014-05-21 NOTE — Progress Notes (Signed)
Pre visit review using our clinic review tool, if applicable. No additional management support is needed unless otherwise documented below in the visit note. 

## 2014-05-21 NOTE — Assessment & Plan Note (Signed)
Well compensated Regular cardiology follow up

## 2014-05-22 LAB — TSH: TSH: 0.76 u[IU]/mL (ref 0.35–4.50)

## 2014-05-22 LAB — PSA: PSA: 0.27 ng/mL (ref 0.10–4.00)

## 2014-05-22 LAB — LIPID PANEL
CHOL/HDL RATIO: 4
Cholesterol: 123 mg/dL (ref 0–200)
HDL: 30.3 mg/dL — ABNORMAL LOW (ref >39.00)
LDL CALC: 56 mg/dL (ref 0–99)
NonHDL: 92.7
TRIGLYCERIDES: 185 mg/dL — AB (ref 0.0–149.0)
VLDL: 37 mg/dL (ref 0.0–40.0)

## 2014-05-22 LAB — COMPREHENSIVE METABOLIC PANEL
ALBUMIN: 3.9 g/dL (ref 3.5–5.2)
ALK PHOS: 56 U/L (ref 39–117)
ALT: 52 U/L (ref 0–53)
AST: 32 U/L (ref 0–37)
BUN: 48 mg/dL — ABNORMAL HIGH (ref 6–23)
CO2: 18 mEq/L — ABNORMAL LOW (ref 19–32)
Calcium: 9.6 mg/dL (ref 8.4–10.5)
Chloride: 110 mEq/L (ref 96–112)
Creatinine, Ser: 2.03 mg/dL — ABNORMAL HIGH (ref 0.40–1.50)
GFR: 36.55 mL/min — ABNORMAL LOW (ref >60.00)
Glucose, Bld: 93 mg/dL (ref 70–99)
Potassium: 5.8 mEq/L — ABNORMAL HIGH (ref 3.5–5.1)
Sodium: 141 mEq/L (ref 135–145)
TOTAL PROTEIN: 7.7 g/dL (ref 6.0–8.3)
Total Bilirubin: 0.4 mg/dL (ref 0.2–1.2)

## 2014-05-22 LAB — CBC WITH DIFFERENTIAL/PLATELET
BASOS PCT: 0.4 % (ref 0.0–3.0)
Basophils Absolute: 0 10*3/uL (ref 0.0–0.1)
EOS PCT: 1.7 % (ref 0.0–5.0)
Eosinophils Absolute: 0.1 10*3/uL (ref 0.0–0.7)
HCT: 42.4 % (ref 39.0–52.0)
HEMOGLOBIN: 14.2 g/dL (ref 13.0–17.0)
LYMPHS PCT: 28.2 % (ref 12.0–46.0)
Lymphs Abs: 2.2 10*3/uL (ref 0.7–4.0)
MCHC: 33.6 g/dL (ref 30.0–36.0)
MCV: 89.1 fl (ref 78.0–100.0)
Monocytes Absolute: 0.5 10*3/uL (ref 0.1–1.0)
Monocytes Relative: 6.9 % (ref 3.0–12.0)
Neutro Abs: 4.9 10*3/uL (ref 1.4–7.7)
Neutrophils Relative %: 62.8 % (ref 43.0–77.0)
Platelets: 172 10*3/uL (ref 150.0–400.0)
RBC: 4.76 Mil/uL (ref 4.22–5.81)
RDW: 15.4 % (ref 11.5–15.5)
WBC: 7.8 10*3/uL (ref 4.0–10.5)

## 2014-05-22 LAB — T4, FREE: FREE T4: 1.23 ng/dL (ref 0.60–1.60)

## 2014-05-22 LAB — HEMOGLOBIN A1C: Hgb A1c MFr Bld: 7.2 % — ABNORMAL HIGH (ref 4.6–6.5)

## 2014-05-23 ENCOUNTER — Other Ambulatory Visit: Payer: Self-pay | Admitting: Internal Medicine

## 2014-05-23 DIAGNOSIS — N183 Chronic kidney disease, stage 3 unspecified: Secondary | ICD-10-CM

## 2014-05-26 ENCOUNTER — Encounter: Payer: Self-pay | Admitting: Endocrinology

## 2014-05-26 ENCOUNTER — Other Ambulatory Visit: Payer: Commercial Managed Care - HMO

## 2014-05-26 ENCOUNTER — Ambulatory Visit (INDEPENDENT_AMBULATORY_CARE_PROVIDER_SITE_OTHER): Payer: Commercial Managed Care - HMO | Admitting: Endocrinology

## 2014-05-26 VITALS — BP 126/62 | HR 62 | Resp 14 | Ht 76.0 in | Wt >= 6400 oz

## 2014-05-26 DIAGNOSIS — E785 Hyperlipidemia, unspecified: Secondary | ICD-10-CM

## 2014-05-26 DIAGNOSIS — I10 Essential (primary) hypertension: Secondary | ICD-10-CM

## 2014-05-26 DIAGNOSIS — E1121 Type 2 diabetes mellitus with diabetic nephropathy: Secondary | ICD-10-CM

## 2014-05-26 DIAGNOSIS — N183 Chronic kidney disease, stage 3 unspecified: Secondary | ICD-10-CM

## 2014-05-26 DIAGNOSIS — Z6841 Body Mass Index (BMI) 40.0 and over, adult: Secondary | ICD-10-CM | POA: Diagnosis not present

## 2014-05-26 LAB — RENAL FUNCTION PANEL
Albumin: 3.7 g/dL (ref 3.5–5.2)
BUN: 47 mg/dL — ABNORMAL HIGH (ref 6–23)
CHLORIDE: 109 meq/L (ref 96–112)
CO2: 22 mEq/L (ref 19–32)
Calcium: 9.3 mg/dL (ref 8.4–10.5)
Creatinine, Ser: 1.79 mg/dL — ABNORMAL HIGH (ref 0.40–1.50)
GFR: 42.26 mL/min — AB (ref >60.00)
Glucose, Bld: 77 mg/dL (ref 70–99)
PHOSPHORUS: 3.1 mg/dL (ref 2.3–4.6)
POTASSIUM: 5.6 meq/L — AB (ref 3.5–5.1)
Sodium: 135 mEq/L (ref 135–145)

## 2014-05-26 NOTE — Patient Instructions (Signed)
Continue current scale for insulin.  Let me know if you start having lows, so that the insulin doses could be reduced. Can continue Glipizide for now.   Let me know if you want to change to Novolin 70/30 vial instead of Novolog mix 70/30.  Please come back for a follow-up appointment in 3 months

## 2014-05-26 NOTE — Progress Notes (Signed)
Pre visit review using our clinic review tool, if applicable. No additional management support is needed unless otherwise documented below in the visit note. 

## 2014-05-26 NOTE — Progress Notes (Signed)
Patient ID: Craig Lindsey, male   DOB: Oct 15, 1960, 54 y.o.   MRN: [id number]    Reason for visit-  Craig Lindsey is a 54 y.o.-year-old male, here for management of Type 2 diabetes, uncontrolled, with complications ( stage 3 CKD, neuropathy). Last seen by me March 2016.   HPI- Patient has been diagnosed with diabetes in 2009. Recalls being initially on lifestyle modifications.  Tried  Metformin, Glipizide, Januvia. he has  been on insulin since 2 years.  was in donut hole for end 2015.  *cost of medication now an issue as this insulin is costing around 1600 dollars per month  * taken off Januvia , was taking it for about 1 year, stopped 5 months due to cost 2015 * Off metformin due to CKD ( November 2015) *Lantus changed to Novolog mix 70/30 ( Nov 2015)>>reports that it is expensive-?donut hole  Pt is currently on a regimen of:  -Glipizide 10 mg daily am  - Novolog mix 70/30 at 50+2 BF and 44+2 supper (generally requiring 48 units twice daily)    Last hemoglobin A1c was: Lab Results  Component Value Date   HGBA1C 7.2* 05/21/2014   HGBA1C 8.8* 02/19/2014   HGBA1C 9.1* 11/11/2013     Pt checks his sugars 3-4 a day . Uses  AccuChek nano glucometer. By meter download they are:  PREMEAL Breakfast Lunch Dinner Bedtime Overall  Glucose range: 90-144 140-188 96-200 76-181   Mean/median:        POST-MEAL PC Breakfast PC Lunch PC Dinner  Glucose range:     Mean/median:       Hypoglycemia-  One recent low-76 -unclear ppt.   Dietary habits- eats three times daily. Has been trying to eat consistently at 2 times daily.Tries to limit carbs, sweetened beverages, sodas, desserts. Now on Herbalife diet Exercise- trying to walk daily, and  Plans to start at [facility] water aerobics- didn't get in this time Weight - going down , on Herbalife supplementation/diet Wt Readings from Last 3 Encounters:  05/26/14 448 lb 4 oz (203.325 kg)  05/21/14 446 lb (202.304 kg)  04/02/14 462 lb 8 oz  (209.789 kg)    Diabetes Complications-  Nephropathy- Yes, Stage3  CKD, last BUN/creatinine-  Lab Results  Component Value Date   BUN 47* 05/26/2014   CREATININE 1.79* 05/26/2014   Lab Results  Component Value Date   GFR 42.26* 05/26/2014   MICRALBCREAT 0.9 02/19/2014    Retinopathy- No, Last DEE was in Feb 2015, due for this year-going to make appt Neuropathy- has numbness and tingling in his feet. Known neuropathy, also believed to be secondary to chemo Associated history - No CAD . Has hx afib and vtach s/p ICD and treated with amiodarone, coumadin. No prior stroke. No hypothyroidism. his last TSH was  Lab Results  Component Value Date   TSH 0.76 05/21/2014    Hyperlipidemia-  his last set of lipids were- Currently on Lipitor 10 mg daily. Tolerating well.  Tgs suboptimal.  Lab Results  Component Value Date   CHOL 123 05/21/2014   HDL 30.30* 05/21/2014   LDLCALC 56 05/21/2014   LDLDIRECT 78.3 03/10/2013   TRIG 185.0* 05/21/2014   CHOLHDL 4 05/21/2014    Blood Pressure/HTN- Patient's blood pressure is well controlled today on current regimen that includes ACE-I ( lisinopril).   I have reviewed the patient's past medical history, medications and allergies.    Current Outpatient Prescriptions on File Prior to Visit  Medication Sig Dispense Refill  .  ACCU-CHEK FASTCLIX LANCETS MISC check blood sugars 3 times daily dx: E11.42 200 each 1  . Alcohol Swabs (B-D SINGLE USE SWABS REGULAR) PADS Use to test blood sugar 3 times daily dx: E11.42 300 each 3  . amiodarone (PACERONE) 200 MG tablet TAKE 1 TABLET EVERY DAY 90 tablet 4  . atorvastatin (LIPITOR) 10 MG tablet Take 1 tablet (10 mg total) by mouth daily at 6 PM. 90 tablet 3  . Blood Glucose Calibration (ACCU-CHEK SMARTVIEW CONTROL) LIQD To use for calibration of glucose monitor dx: E11.42 1 each 0  . Blood Glucose Monitoring Suppl (ACCU-CHEK NANO SMARTVIEW) W/DEVICE KIT 1 kit by Does not apply route once. 1 kit 0  .  diltiazem (DILACOR XR) 240 MG 24 hr capsule Take 1 capsule (240 mg total) by mouth every morning. 90 capsule 3  . furosemide (LASIX) 40 MG tablet Take one tablet by mouth daily and as needed 135 tablet 3  . glipiZIDE (GLUCOTROL XL) 10 MG 24 hr tablet Take 1 tablet (10 mg total) by mouth daily with breakfast. 90 tablet 3  . glucose blood (ACCU-CHEK SMARTVIEW) test strip Check 3 times daily as instructed. 300 each 3  . injection device for insulin (B-D PEN MINI) DEVI Use for insulin injections twice daily 60 each 11  . insulin aspart protamine - aspart (NOVOLOG 70/30 MIX) (70-30) 100 UNIT/ML FlexPen Use on sliding scale between 50-70 units twice daily ( with breakfast and supper). Max daily dose 150 units 9 pen 2  . Insulin Pen Needle (BD ULTRA-FINE PEN NEEDLES) 29G X 12.7MM MISC To use to check blood sugar daily 200 each 3  . lisinopril (PRINIVIL,ZESTRIL) 40 MG tablet Take 1 tablet (40 mg total) by mouth daily. 90 tablet 3  . metoprolol succinate (TOPROL-XL) 100 MG 24 hr tablet Take 2 tablets (200 mg total) by mouth daily. Take with or immediately following a meal. 180 tablet 3  . multivitamin (THERAGRAN) per tablet Take 1 tablet by mouth daily.      . NON FORMULARY Knee high compression hose 20-30 mmhg. Apply daily and remove at bedtime.     Marland Kitchen spironolactone (ALDACTONE) 50 MG tablet Take 1 tablet (50 mg total) by mouth 2 (two) times daily. 180 tablet 3  . warfarin (COUMADIN) 6 MG tablet TAKE 1 TABLET (6 MG TOTAL) BY MOUTH DAILY. 90 tablet 3   No current facility-administered medications on file prior to visit.   No Known Allergies   Review of Systems- [ x ]  Complains of    [  ]  denies [  ] Recent weight change [  ]  Fatigue [  ] polydipsia [  ] polyuria [  ]  nocturia [  ]  vision difficulty [  ] chest pain [  ] shortness of breath [  ] leg swelling [  ] cough [  ] nausea/vomiting [  ] diarrhea [  ] constipation [  ] abdominal pain [  ]  tingling/numbness in extremities [  ]  concern with  feet ( wounds/sores)  PE: BP 126/62 mmHg  Pulse 62  Resp 14  Ht 6\' 4"  (1.93 m)  Wt 448 lb 4 oz (203.325 kg)  BMI 54.59 kg/m2  SpO2 94% Wt Readings from Last 3 Encounters:  05/26/14 448 lb 4 oz (203.325 kg)  05/21/14 446 lb (202.304 kg)  04/02/14 462 lb 8 oz (209.789 kg)   Exam: deferred  ASSESSMENT AND PLAN:   Problem List Items Addressed This Visit  Cardiovascular and Mediastinum   Essential hypertension    BP at target on current regimen. normal urine MA feb 2016             Genitourinary   Diabetic nephropathy associated with type 2 diabetes mellitus    Last A1c well above target, but with improvement. We should aim for less stringent A1c goal  Of close to 7.5% in his case due to multiple comorbidities.  He will continue to work on diet and exercise. Suggested adding protein to BF and adding veggies to lunch and cutting back bread.  Improving sugars, A1c and weight on new diet plan.  Continue current  Novolog mix 70/30 to 50+2 BF and 44+2 with supper.  He will let me know if he wishes to change to Novolin 70/30 insulin to cut back costs.  Notify if start to develop lows.  He can continue the Glipizide for now, but will re-evaluate need to continue this given that he will be on premixed insulin.                   Chronic kidney disease, stage III (moderate)    Recent Cr stable and c/w stage 3 CKD           Other   Hyperlipidemia - Primary    Last LDL at target on current statin. TGs suboptimal -expect improvement with better sugars and weight loss.           BMI 50.0-59.9, adult    Congratulated him on success with recent diet plan and weight loss          - Return to clinic in   3 months with sugar log/meter.  Craig Lindsey [facility] 05/29/2014 11:33 AM

## 2014-05-27 LAB — CUP PACEART REMOTE DEVICE CHECK
HIGH POWER IMPEDANCE MEASURED VALUE: 53 Ohm
HighPow Impedance: 456 Ohm
HighPow Impedance: 71 Ohm
Lead Channel Impedance Value: 551 Ohm
Lead Channel Pacing Threshold Amplitude: 0.625 V
Lead Channel Pacing Threshold Pulse Width: 0.4 ms
Lead Channel Setting Pacing Amplitude: 2.5 V
Lead Channel Setting Sensing Sensitivity: 0.3 mV
MDC IDC MSMT BATTERY VOLTAGE: 3.02 V
MDC IDC MSMT LEADCHNL RV SENSING INTR AMPL: 9.125 mV
MDC IDC MSMT LEADCHNL RV SENSING INTR AMPL: 9.125 mV
MDC IDC SESS DTM: 20160505011542
MDC IDC SET LEADCHNL RV PACING PULSEWIDTH: 0.4 ms
MDC IDC SET ZONE DETECTION INTERVAL: 300 ms
MDC IDC STAT BRADY RV PERCENT PACED: 0.21 %
Zone Setting Detection Interval: 340 ms
Zone Setting Detection Interval: 450 ms

## 2014-05-29 NOTE — Assessment & Plan Note (Signed)
Recent Cr stable and c/w stage 3 CKD

## 2014-05-29 NOTE — Assessment & Plan Note (Signed)
Last A1c well above target, but with improvement. We should aim for less stringent A1c goal  Of close to 7.5% in his case due to multiple comorbidities.  He will continue to work on diet and exercise. Suggested adding protein to BF and adding veggies to lunch and cutting back bread.  Improving sugars, A1c and weight on new diet plan.  Continue current  Novolog mix 70/30 to 50+2 BF and 44+2 with supper.  He will let me know if he wishes to change to Novolin 70/30 insulin to cut back costs.  Notify if start to develop lows.  He can continue the Glipizide for now, but will re-evaluate need to continue this given that he will be on premixed insulin.

## 2014-05-29 NOTE — Assessment & Plan Note (Addendum)
Last LDL at target on current statin. TGs suboptimal -expect improvement with better sugars and weight loss.

## 2014-05-29 NOTE — Assessment & Plan Note (Signed)
BP at target on current regimen. normal urine MA feb 2016

## 2014-05-29 NOTE — Progress Notes (Signed)
Remote ICD transmission.   

## 2014-05-29 NOTE — Assessment & Plan Note (Signed)
Congratulated him on success with recent diet plan and weight loss

## 2014-06-05 ENCOUNTER — Encounter: Payer: Self-pay | Admitting: Cardiology

## 2014-06-09 ENCOUNTER — Encounter: Payer: Self-pay | Admitting: Internal Medicine

## 2014-06-11 ENCOUNTER — Other Ambulatory Visit (INDEPENDENT_AMBULATORY_CARE_PROVIDER_SITE_OTHER): Payer: Commercial Managed Care - HMO

## 2014-06-11 DIAGNOSIS — Z7901 Long term (current) use of anticoagulants: Secondary | ICD-10-CM

## 2014-06-11 DIAGNOSIS — Z5181 Encounter for therapeutic drug level monitoring: Secondary | ICD-10-CM

## 2014-06-11 DIAGNOSIS — I4891 Unspecified atrial fibrillation: Secondary | ICD-10-CM | POA: Diagnosis not present

## 2014-06-11 LAB — POCT INR: INR: 3.5

## 2014-07-10 ENCOUNTER — Encounter: Payer: Self-pay | Admitting: Internal Medicine

## 2014-07-13 ENCOUNTER — Ambulatory Visit: Payer: Commercial Managed Care - HMO

## 2014-07-13 ENCOUNTER — Ambulatory Visit (INDEPENDENT_AMBULATORY_CARE_PROVIDER_SITE_OTHER): Payer: Commercial Managed Care - HMO | Admitting: *Deleted

## 2014-07-13 ENCOUNTER — Encounter: Payer: Self-pay | Admitting: Internal Medicine

## 2014-07-13 ENCOUNTER — Ambulatory Visit (INDEPENDENT_AMBULATORY_CARE_PROVIDER_SITE_OTHER): Payer: Commercial Managed Care - HMO | Admitting: Internal Medicine

## 2014-07-13 VITALS — BP 140/70 | HR 107 | Temp 98.1°F | Wt >= 6400 oz

## 2014-07-13 DIAGNOSIS — Z5181 Encounter for therapeutic drug level monitoring: Secondary | ICD-10-CM | POA: Diagnosis not present

## 2014-07-13 DIAGNOSIS — J0100 Acute maxillary sinusitis, unspecified: Secondary | ICD-10-CM | POA: Insufficient documentation

## 2014-07-13 LAB — POCT INR: INR: 3.3

## 2014-07-13 NOTE — Assessment & Plan Note (Signed)
Seems to be viral Now improving Discussed supportive care If he worsens later in the week, will Rx amoxil

## 2014-07-13 NOTE — Progress Notes (Signed)
Subjective:    Patient ID: Craig Lindsey, male    DOB: [birth year], 54 y.o.   MRN: [id number]  HPI Here due to respiratory symptoms Started as runny nose and sore throat Then moved to chest---coughing up some phlegm Started about 10 days ago Is better than he was Now the phlegm is lightly yellow---better than it was  Not SOB No fever No night sweats or chills Had right maxillary tenderness--which is better now No ear pain Sore throat is gone  Has used mucinex DM-- some help Took tylenol briefly  Current Outpatient Prescriptions on File Prior to Visit  Medication Sig Dispense Refill  . ACCU-CHEK FASTCLIX LANCETS MISC check blood sugars 3 times daily dx: E11.42 200 each 1  . Alcohol Swabs (B-D SINGLE USE SWABS REGULAR) PADS Use to test blood sugar 3 times daily dx: E11.42 300 each 3  . amiodarone (PACERONE) 200 MG tablet TAKE 1 TABLET EVERY DAY 90 tablet 4  . atorvastatin (LIPITOR) 10 MG tablet Take 1 tablet (10 mg total) by mouth daily at 6 PM. 90 tablet 3  . diltiazem (DILACOR XR) 240 MG 24 hr capsule Take 1 capsule (240 mg total) by mouth every morning. 90 capsule 3  . furosemide (LASIX) 40 MG tablet Take one tablet by mouth daily and as needed 135 tablet 3  . glipiZIDE (GLUCOTROL XL) 10 MG 24 hr tablet Take 1 tablet (10 mg total) by mouth daily with breakfast. 90 tablet 3  . glucose blood (ACCU-CHEK SMARTVIEW) test strip Check 3 times daily as instructed. 300 each 3  . insulin aspart protamine - aspart (NOVOLOG 70/30 MIX) (70-30) 100 UNIT/ML FlexPen Use on sliding scale between 50-70 units twice daily ( with breakfast and supper). Max daily dose 150 units 9 pen 2  . Insulin Pen Needle (BD ULTRA-FINE PEN NEEDLES) 29G X 12.7MM MISC To use to check blood sugar daily 200 each 3  . lisinopril (PRINIVIL,ZESTRIL) 40 MG tablet Take 1 tablet (40 mg total) by mouth daily. 90 tablet 3  . metoprolol succinate (TOPROL-XL) 100 MG 24 hr tablet Take 2 tablets (200 mg total) by mouth daily.  Take with or immediately following a meal. 180 tablet 3  . multivitamin (THERAGRAN) per tablet Take 1 tablet by mouth daily.      . NON FORMULARY Knee high compression hose 20-30 mmhg. Apply daily and remove at bedtime.     Marland Kitchen spironolactone (ALDACTONE) 50 MG tablet Take 1 tablet (50 mg total) by mouth 2 (two) times daily. 180 tablet 3  . warfarin (COUMADIN) 6 MG tablet TAKE 1 TABLET (6 MG TOTAL) BY MOUTH DAILY. 90 tablet 3   No current facility-administered medications on file prior to visit.    No Known Allergies  Past Medical History  Diagnosis Date  . Diabetes mellitus     Type II  . Hyperlipidemia   . Hypertension   . Arrhythmia 12/04    1. Ventricular tachycardia; Dr. [name]  2. Atrial fibrillation  . Obesity   . Hemorrhoids     hx of  . Personal history of colon cancer, stage III 1/09    Dr. Carron Brazen, D. Newman  . Neuropathy     Oxaliplatin  . DVT (deep venous thrombosis) 7/09    RLE, Tx Arixtra  . Sigmoid colon ulcer 5 yrs ago    Sigmoid; Nydia Bouton, MD (colon cancer)  . Sleep apnea     uses cpap setting of 10  . Chronic kidney disease  Complex L renal cyst, no tx for   . Umbilical hernia     Past Surgical History  Procedure Laterality Date  . Cardiac defibrillator placement    . Colectomy  1/09    Sigmoid; Nydia Bouton, MD (colon cancer)  . Cholecystectomy    . Colonoscopy    . Hernia repair  yrs ago    umb hernia  . Colonoscopy N/A 02/25/2013    Procedure: COLONOSCOPY;  Surgeon: Gatha Mayer, MD;  Location: [facility];  Service: Endoscopy;  Laterality: N/A;    Family History  Problem Relation Age of Onset  . Diabetes Father   . Cancer Father     myelofibrosis  . Diabetes Maternal Grandmother   . Hypertension Neg Hx   . Coronary artery disease Neg Hx   . Prostate cancer Neg Hx   . Colon cancer Other   . Cancer Maternal Aunt     colon    History   Social History  . Marital Status: Married    Spouse Name: N/A  . Number of Children:  2  . Years of Education: N/A   Occupational History  . [employer]     Disabled   Social History Main Topics  . Smoking status: Never Smoker   . Smokeless tobacco: Never Used  . Alcohol Use: No  . Drug Use: No  . Sexual Activity: Yes   Other Topics Concern  . Not on file   Social History Narrative   2 step children from wife's 2nd marriage.   Review of Systems  No rash No vomiting or diarrhea Appetite is okay     Objective:   Physical Exam  Constitutional: He appears well-developed. No distress.  HENT:  No sinus tenderness TMs normal Mild nasal inflammation Slight pharyngeal injection only  Neck: Normal range of motion. Neck supple.  Pulmonary/Chest: Effort normal and breath sounds normal. No respiratory distress. He has no wheezes. He has no rales.  Lymphadenopathy:    He has no cervical adenopathy.          Assessment & Plan:

## 2014-07-13 NOTE — Progress Notes (Signed)
Pre visit review using our clinic review tool, if applicable. No additional management support is needed unless otherwise documented below in the visit note. 

## 2014-07-14 ENCOUNTER — Ambulatory Visit: Payer: Commercial Managed Care - HMO

## 2014-07-16 ENCOUNTER — Encounter: Payer: Self-pay | Admitting: Internal Medicine

## 2014-07-16 MED ORDER — AMOXICILLIN 500 MG PO TABS: 1000.0000 mg | ORAL_TABLET | Freq: Two times a day (BID) | ORAL | Status: DC

## 2014-08-10 ENCOUNTER — Ambulatory Visit: Payer: Commercial Managed Care - HMO

## 2014-08-11 ENCOUNTER — Encounter: Payer: Self-pay | Admitting: Internal Medicine

## 2014-08-13 ENCOUNTER — Ambulatory Visit (INDEPENDENT_AMBULATORY_CARE_PROVIDER_SITE_OTHER): Payer: Commercial Managed Care - HMO | Admitting: *Deleted

## 2014-08-13 DIAGNOSIS — I4891 Unspecified atrial fibrillation: Secondary | ICD-10-CM

## 2014-08-13 DIAGNOSIS — Z7901 Long term (current) use of anticoagulants: Secondary | ICD-10-CM

## 2014-08-13 DIAGNOSIS — Z5181 Encounter for therapeutic drug level monitoring: Secondary | ICD-10-CM | POA: Diagnosis not present

## 2014-08-13 LAB — POCT INR: INR: 2.7

## 2014-08-13 NOTE — Progress Notes (Signed)
Pre visit review using our clinic review tool, if applicable. No additional management support is needed unless otherwise documented below in the visit note. 

## 2014-08-18 ENCOUNTER — Telehealth: Payer: Self-pay | Admitting: *Deleted

## 2014-08-18 DIAGNOSIS — G4733 Obstructive sleep apnea (adult) (pediatric): Secondary | ICD-10-CM

## 2014-08-18 NOTE — Telephone Encounter (Signed)
Referral made 

## 2014-08-18 NOTE — Telephone Encounter (Signed)
Pt would like to get a new cpap and after speaking with marion (in the hall) pt had his last sleep study over 10 years ago and pt would need to have a new one. Pt has [employer] also pt states his machine is very old and he knows they have new machines out that are better than what he has. Pt would like referral to pulmonary in [place] to have all this done.

## 2014-08-20 ENCOUNTER — Ambulatory Visit (INDEPENDENT_AMBULATORY_CARE_PROVIDER_SITE_OTHER): Payer: Commercial Managed Care - HMO | Admitting: *Deleted

## 2014-08-20 DIAGNOSIS — I4729 Other ventricular tachycardia: Secondary | ICD-10-CM

## 2014-08-20 DIAGNOSIS — Z95810 Presence of automatic (implantable) cardiac defibrillator: Secondary | ICD-10-CM

## 2014-08-20 DIAGNOSIS — I472 Ventricular tachycardia: Secondary | ICD-10-CM

## 2014-08-20 NOTE — Progress Notes (Signed)
Remote ICD transmission.   

## 2014-08-26 ENCOUNTER — Ambulatory Visit: Payer: Commercial Managed Care - HMO | Admitting: Endocrinology

## 2014-08-27 LAB — CUP PACEART REMOTE DEVICE CHECK
Battery Voltage: 3 V
HIGH POWER IMPEDANCE MEASURED VALUE: 54 Ohm
HIGH POWER IMPEDANCE MEASURED VALUE: 67 Ohm
HighPow Impedance: 456 Ohm
Lead Channel Pacing Threshold Amplitude: 0.625 V
Lead Channel Sensing Intrinsic Amplitude: 10.25 mV
Lead Channel Sensing Intrinsic Amplitude: 10.25 mV
Lead Channel Setting Pacing Amplitude: 2.5 V
Lead Channel Setting Pacing Pulse Width: 0.4 ms
Lead Channel Setting Sensing Sensitivity: 0.3 mV
MDC IDC MSMT LEADCHNL RV IMPEDANCE VALUE: 532 Ohm
MDC IDC MSMT LEADCHNL RV PACING THRESHOLD PULSEWIDTH: 0.4 ms
MDC IDC SESS DTM: 20160804125335
MDC IDC SET ZONE DETECTION INTERVAL: 300 ms
MDC IDC SET ZONE DETECTION INTERVAL: 340 ms
MDC IDC STAT BRADY RV PERCENT PACED: 0.46 %
Zone Setting Detection Interval: 450 ms

## 2014-09-10 ENCOUNTER — Ambulatory Visit: Payer: Self-pay

## 2014-09-24 ENCOUNTER — Ambulatory Visit (INDEPENDENT_AMBULATORY_CARE_PROVIDER_SITE_OTHER): Payer: Commercial Managed Care - HMO | Admitting: *Deleted

## 2014-09-24 DIAGNOSIS — Z5181 Encounter for therapeutic drug level monitoring: Secondary | ICD-10-CM

## 2014-09-24 DIAGNOSIS — Z7901 Long term (current) use of anticoagulants: Secondary | ICD-10-CM

## 2014-09-24 DIAGNOSIS — I4891 Unspecified atrial fibrillation: Secondary | ICD-10-CM

## 2014-09-24 LAB — POCT INR: INR: 2.4

## 2014-09-24 NOTE — Progress Notes (Signed)
Pre visit review using our clinic review tool, if applicable. No additional management support is needed unless otherwise documented below in the visit note. 

## 2014-09-30 ENCOUNTER — Encounter: Payer: Self-pay | Admitting: Internal Medicine

## 2014-11-04 ENCOUNTER — Other Ambulatory Visit: Payer: Self-pay | Admitting: Internal Medicine

## 2014-11-05 ENCOUNTER — Ambulatory Visit (INDEPENDENT_AMBULATORY_CARE_PROVIDER_SITE_OTHER): Payer: Commercial Managed Care - HMO | Admitting: *Deleted

## 2014-11-05 DIAGNOSIS — Z7901 Long term (current) use of anticoagulants: Secondary | ICD-10-CM | POA: Diagnosis not present

## 2014-11-05 DIAGNOSIS — Z5181 Encounter for therapeutic drug level monitoring: Secondary | ICD-10-CM | POA: Diagnosis not present

## 2014-11-05 DIAGNOSIS — I4891 Unspecified atrial fibrillation: Secondary | ICD-10-CM

## 2014-11-05 LAB — POCT INR: INR: 3.3

## 2014-11-05 NOTE — Progress Notes (Signed)
Pre visit review using our clinic review tool, if applicable. No additional management support is needed unless otherwise documented below in the visit note. 

## 2014-11-23 ENCOUNTER — Telehealth: Payer: Self-pay | Admitting: Cardiology

## 2014-11-23 ENCOUNTER — Ambulatory Visit (INDEPENDENT_AMBULATORY_CARE_PROVIDER_SITE_OTHER): Payer: Commercial Managed Care - HMO | Admitting: *Deleted

## 2014-11-23 DIAGNOSIS — I472 Ventricular tachycardia: Secondary | ICD-10-CM | POA: Diagnosis not present

## 2014-11-23 DIAGNOSIS — I4729 Other ventricular tachycardia: Secondary | ICD-10-CM

## 2014-11-23 NOTE — Telephone Encounter (Signed)
Spoke with pt and reminded pt of remote transmission that is due today. Pt verbalized understanding.   

## 2014-11-24 NOTE — Progress Notes (Signed)
Remote ICD transmission.   

## 2014-11-25 LAB — CUP PACEART REMOTE DEVICE CHECK
Battery Voltage: 2.99 V
Brady Statistic RV Percent Paced: 0.86 %
HIGH POWER IMPEDANCE MEASURED VALUE: 475 Ohm
HighPow Impedance: 57 Ohm
HighPow Impedance: 76 Ohm
Implantable Lead Implant Date: 20050113
Implantable Lead Location: 753860
Implantable Lead Model: 6949
Lead Channel Pacing Threshold Amplitude: 0.625 V
Lead Channel Setting Pacing Amplitude: 2.5 V
Lead Channel Setting Pacing Pulse Width: 0.4 ms
Lead Channel Setting Sensing Sensitivity: 0.3 mV
MDC IDC MSMT LEADCHNL RV IMPEDANCE VALUE: 608 Ohm
MDC IDC MSMT LEADCHNL RV PACING THRESHOLD PULSEWIDTH: 0.4 ms
MDC IDC MSMT LEADCHNL RV SENSING INTR AMPL: 11.125 mV
MDC IDC SESS DTM: 20161108025126

## 2014-11-26 ENCOUNTER — Encounter: Payer: Self-pay | Admitting: Cardiology

## 2014-12-01 ENCOUNTER — Ambulatory Visit: Payer: Commercial Managed Care - HMO | Admitting: Internal Medicine

## 2014-12-16 ENCOUNTER — Other Ambulatory Visit: Payer: Self-pay | Admitting: Internal Medicine

## 2014-12-24 ENCOUNTER — Ambulatory Visit: Payer: Commercial Managed Care - HMO | Admitting: Internal Medicine

## 2014-12-24 ENCOUNTER — Ambulatory Visit: Payer: Commercial Managed Care - HMO

## 2014-12-31 ENCOUNTER — Ambulatory Visit: Payer: Commercial Managed Care - HMO

## 2014-12-31 ENCOUNTER — Ambulatory Visit: Payer: Commercial Managed Care - HMO | Admitting: Internal Medicine

## 2015-01-04 ENCOUNTER — Encounter: Payer: Self-pay | Admitting: Internal Medicine

## 2015-01-19 ENCOUNTER — Encounter: Payer: Self-pay | Admitting: Internal Medicine

## 2015-01-19 MED ORDER — ACCU-CHEK FASTCLIX LANCETS MISC
Status: DC
Start: 2015-01-19 — End: 2015-02-04

## 2015-01-26 ENCOUNTER — Ambulatory Visit: Payer: Self-pay | Admitting: Internal Medicine

## 2015-01-31 ENCOUNTER — Other Ambulatory Visit: Payer: Self-pay | Admitting: Internal Medicine

## 2015-02-04 ENCOUNTER — Ambulatory Visit (INDEPENDENT_AMBULATORY_CARE_PROVIDER_SITE_OTHER): Payer: Commercial Managed Care - HMO | Admitting: Internal Medicine

## 2015-02-04 ENCOUNTER — Ambulatory Visit (INDEPENDENT_AMBULATORY_CARE_PROVIDER_SITE_OTHER): Payer: Commercial Managed Care - HMO | Admitting: *Deleted

## 2015-02-04 ENCOUNTER — Encounter: Payer: Self-pay | Admitting: Internal Medicine

## 2015-02-04 VITALS — BP 128/70 | HR 69 | Temp 98.6°F | Wt >= 6400 oz

## 2015-02-04 DIAGNOSIS — I5032 Chronic diastolic (congestive) heart failure: Secondary | ICD-10-CM

## 2015-02-04 DIAGNOSIS — Z5181 Encounter for therapeutic drug level monitoring: Secondary | ICD-10-CM | POA: Diagnosis not present

## 2015-02-04 DIAGNOSIS — E1121 Type 2 diabetes mellitus with diabetic nephropathy: Secondary | ICD-10-CM

## 2015-02-04 DIAGNOSIS — Z7901 Long term (current) use of anticoagulants: Secondary | ICD-10-CM | POA: Diagnosis not present

## 2015-02-04 DIAGNOSIS — E1140 Type 2 diabetes mellitus with diabetic neuropathy, unspecified: Secondary | ICD-10-CM | POA: Diagnosis not present

## 2015-02-04 DIAGNOSIS — Z23 Encounter for immunization: Secondary | ICD-10-CM | POA: Diagnosis not present

## 2015-02-04 DIAGNOSIS — E1129 Type 2 diabetes mellitus with other diabetic kidney complication: Secondary | ICD-10-CM | POA: Diagnosis not present

## 2015-02-04 DIAGNOSIS — I4891 Unspecified atrial fibrillation: Secondary | ICD-10-CM

## 2015-02-04 LAB — POCT INR: INR: 2.7

## 2015-02-04 LAB — RENAL FUNCTION PANEL
Albumin: 3.9 g/dL (ref 3.5–5.2)
BUN: 22 mg/dL (ref 6–23)
CALCIUM: 9.4 mg/dL (ref 8.4–10.5)
CHLORIDE: 105 meq/L (ref 96–112)
CO2: 30 meq/L (ref 19–32)
CREATININE: 1.43 mg/dL (ref 0.40–1.50)
GFR: 54.62 mL/min — AB (ref >60.00)
Glucose, Bld: 144 mg/dL — ABNORMAL HIGH (ref 70–99)
PHOSPHORUS: 2.3 mg/dL (ref 2.3–4.6)
POTASSIUM: 4.2 meq/L (ref 3.5–5.1)
SODIUM: 140 meq/L (ref 135–145)

## 2015-02-04 LAB — HEMOGLOBIN A1C: HEMOGLOBIN A1C: 7.2 % — AB (ref 4.6–6.5)

## 2015-02-04 MED ORDER — ACCU-CHEK FASTCLIX LANCETS MISC
Status: DC
Start: 2015-02-04 — End: 2015-03-05

## 2015-02-04 NOTE — Assessment & Plan Note (Signed)
Paced On coumadin ?consider change to NOAC---seeing Dr Caryl Comes soon

## 2015-02-04 NOTE — Progress Notes (Signed)
Pre visit review using our clinic review tool, if applicable. No additional management support is needed unless otherwise documented below in the visit note. 

## 2015-02-04 NOTE — Assessment & Plan Note (Signed)
Hopefully stable Will set up for routine care with nephrology

## 2015-02-04 NOTE — Assessment & Plan Note (Signed)
Compensated. No changes needed. 

## 2015-02-04 NOTE — Assessment & Plan Note (Signed)
Hopefully still well controlled No sig foot pain so no meds needed for neuropathy

## 2015-02-04 NOTE — Progress Notes (Signed)
Subjective:    Patient ID: Craig Lindsey, male    DOB: March 10, 1960, 55 y.o.   MRN: [id number]  HPI Here for follow up of diabetes and other chronic medical conditions  Doing okay Stress with dad and his myelofibrosis and SVT  Feels he is keeping up with diabetic diet Went off herbalife Weight back up some Checks sugars in AM-- 140-155. Before dinner is similar Does adjust his insulin per Dr Jeral Fruit has left though Feet with numbness but no nerve pain. Some Achilles pain on right  No chest pain No SOB No palpitations Slight edema--- support hose controls  Hasn't seen nephrologist  Current Outpatient Prescriptions on File Prior to Visit  Medication Sig Dispense Refill  . ACCU-CHEK FASTCLIX LANCETS MISC check blood sugars 3 times daily dx: E11.42 102 each 0  . Alcohol Swabs (B-D SINGLE USE SWABS REGULAR) PADS USE TO TEST BLOOD SUGAR 3 TIMES DAILY  300 each 3  . amiodarone (PACERONE) 200 MG tablet TAKE 1 TABLET EVERY DAY 90 tablet 4  . atorvastatin (LIPITOR) 10 MG tablet TAKE 1 TABLET DAILY AT 6:00PM 90 tablet 3  . DILT-XR 240 MG 24 hr capsule TAKE 1 CAPSULE  BY MOUTH EVERY MORNING. 90 capsule 3  . furosemide (LASIX) 40 MG tablet Take one tablet by mouth daily and as needed 135 tablet 3  . glipiZIDE (GLUCOTROL XL) 10 MG 24 hr tablet TAKE 1 TABLET  DAILY WITH BREAKFAST. 90 tablet 3  . glucose blood (ACCU-CHEK SMARTVIEW) test strip Check 3 times daily as instructed. 300 each 3  . insulin aspart protamine - aspart (NOVOLOG 70/30 MIX) (70-30) 100 UNIT/ML FlexPen Use on sliding scale between 50-70 units twice daily ( with breakfast and supper). Max daily dose 150 units 9 pen 2  . Insulin Pen Needle (BD ULTRA-FINE PEN NEEDLES) 29G X 12.7MM MISC To use to check blood sugar daily 200 each 3  . lisinopril (PRINIVIL,ZESTRIL) 40 MG tablet TAKE 1 TABLET EVERY DAY 90 tablet 3  . metoprolol succinate (TOPROL-XL) 100 MG 24 hr tablet TAKE 2 TABLETS BY MOUTH DAILY. TAKE WITH OR IMMEDIATELY  FOLLOWING A MEAL. 180 tablet 3  . multivitamin (THERAGRAN) per tablet Take 1 tablet by mouth daily.      . NON FORMULARY Knee high compression hose 20-30 mmhg. Apply daily and remove at bedtime.     Marland Kitchen spironolactone (ALDACTONE) 50 MG tablet Take 1 tablet (50 mg total) by mouth 2 (two) times daily. 180 tablet 3  . warfarin (COUMADIN) 6 MG tablet TAKE 1 TABLET EVERY DAY 90 tablet 3   No current facility-administered medications on file prior to visit.    No Known Allergies  Past Medical History  Diagnosis Date  . Diabetes mellitus     Type II  . Hyperlipidemia   . Hypertension   . Arrhythmia 12/04    1. Ventricular tachycardia; Dr. [name]  2. Atrial fibrillation  . Obesity   . Hemorrhoids     hx of  . Personal history of colon cancer, stage III 1/09    Dr. Carron Brazen, D. [place]  . Neuropathy (HCC)     Oxaliplatin  . DVT (deep venous thrombosis) ([place]) 7/09    RLE, Tx Arixtra  . Sigmoid colon ulcer 5 yrs ago    Sigmoid; Nydia Bouton, MD (colon cancer)  . Sleep apnea     uses cpap setting of 10  . Chronic kidney disease     Complex L renal cyst, no tx for   .  Umbilical hernia     Past Surgical History  Procedure Laterality Date  . Cardiac defibrillator placement    . Colectomy  1/09    Sigmoid; Nydia Bouton, MD (colon cancer)  . Cholecystectomy    . Colonoscopy    . Hernia repair  yrs ago    umb hernia  . Colonoscopy N/A 02/25/2013    Procedure: COLONOSCOPY;  Surgeon: Gatha Mayer, MD;  Location: [facility];  Service: Endoscopy;  Laterality: N/A;    Family History  Problem Relation Age of Onset  . Diabetes Father   . Cancer Father     myelofibrosis  . Diabetes Maternal Grandmother   . Hypertension Neg Hx   . Coronary artery disease Neg Hx   . Prostate cancer Neg Hx   . Colon cancer Other   . Cancer Maternal Aunt     colon    Social History   Social History  . Marital Status: Married    Spouse Name: N/A  . Number of Children: 2  . Years of  Education: N/A   Occupational History  . [employer]     Disabled   Social History Main Topics  . Smoking status: Never Smoker   . Smokeless tobacco: Never Used  . Alcohol Use: No  . Drug Use: No  . Sexual Activity: Yes   Other Topics Concern  . Not on file   Social History Narrative   2 step children from wife's 2nd marriage.   Review of Systems Appetite is good Sleeping well Cold this week--has 2 grandkids living with them-- 12 and 32 year olds (and daughter). SIL in service in [place]    Objective:   Physical Exam  Constitutional: He appears well-developed. No distress.  Neck: Normal range of motion. Neck supple.  Cardiovascular: Normal rate, regular rhythm, normal heart sounds and intact distal pulses.  Exam reveals no gallop.   No murmur heard. Pulmonary/Chest: Effort normal and breath sounds normal. No respiratory distress. He has no wheezes. He has no rales.  Musculoskeletal: He exhibits no edema.  Lymphadenopathy:    He has no cervical adenopathy.  Skin:  No foot lesions          Assessment & Plan:

## 2015-02-04 NOTE — Addendum Note (Signed)
Addended by: Tammi Sou on: 02/04/2015 02:46 PM   Modules accepted: Orders

## 2015-02-05 LAB — PARATHYROID HORMONE, INTACT (NO CA): PTH: 54 pg/mL (ref 14–64)

## 2015-02-08 ENCOUNTER — Encounter: Payer: Self-pay | Admitting: Internal Medicine

## 2015-02-08 MED ORDER — AMOXICILLIN 500 MG PO TABS: 1000.0000 mg | ORAL_TABLET | Freq: Two times a day (BID) | ORAL | Status: DC

## 2015-02-17 ENCOUNTER — Encounter: Payer: Self-pay | Admitting: *Deleted

## 2015-03-05 ENCOUNTER — Ambulatory Visit (INDEPENDENT_AMBULATORY_CARE_PROVIDER_SITE_OTHER): Payer: Commercial Managed Care - HMO | Admitting: Internal Medicine

## 2015-03-05 ENCOUNTER — Encounter: Payer: Self-pay | Admitting: Internal Medicine

## 2015-03-05 VITALS — BP 156/78 | HR 76 | Ht 76.0 in | Wt >= 6400 oz

## 2015-03-05 DIAGNOSIS — I480 Paroxysmal atrial fibrillation: Secondary | ICD-10-CM | POA: Diagnosis not present

## 2015-03-05 DIAGNOSIS — I4729 Other ventricular tachycardia: Secondary | ICD-10-CM

## 2015-03-05 DIAGNOSIS — Z95810 Presence of automatic (implantable) cardiac defibrillator: Secondary | ICD-10-CM

## 2015-03-05 DIAGNOSIS — I4901 Ventricular fibrillation: Secondary | ICD-10-CM

## 2015-03-05 DIAGNOSIS — I5032 Chronic diastolic (congestive) heart failure: Secondary | ICD-10-CM

## 2015-03-05 DIAGNOSIS — I472 Ventricular tachycardia: Secondary | ICD-10-CM

## 2015-03-05 MED ORDER — RIVAROXABAN 20 MG PO TABS: 20.0000 mg | ORAL_TABLET | Freq: Every day | ORAL | Status: DC

## 2015-03-05 NOTE — Progress Notes (Signed)
HPI Craig Lindsey returns today for followup. He is a 55 year old man with morbid obesity, paroxysmal atrial fibrillation, ventricular fibrillation, status post ICD implantation. The patient has a history of colon cancer, status post resection and chemotherapy. He has been cancer free for over six years. He has had negative colonoscopies. He does not have much in the way of heart failure symptoms, class II diastolic heart failure. He has done a better job of controlling his fluid over the past year.  He has modest peripheral edema. No syncope or recent ICD shock. No Known Allergies   Current Outpatient Prescriptions  Medication Sig Dispense Refill  . amiodarone (PACERONE) 200 MG tablet TAKE 1 TABLET EVERY DAY 90 tablet 4  . atorvastatin (LIPITOR) 10 MG tablet TAKE 1 TABLET DAILY AT 6:00PM 90 tablet 3  . DILT-XR 240 MG 24 hr capsule TAKE 1 CAPSULE  BY MOUTH EVERY MORNING. 90 capsule 3  . furosemide (LASIX) 40 MG tablet Take one tablet by mouth daily and as needed 135 tablet 3  . glipiZIDE (GLUCOTROL XL) 10 MG 24 hr tablet TAKE 1 TABLET  DAILY WITH BREAKFAST. 90 tablet 3  . insulin aspart protamine - aspart (NOVOLOG 70/30 MIX) (70-30) 100 UNIT/ML FlexPen Use on sliding scale between 50-70 units twice daily ( with breakfast and supper). Max daily dose 150 units 9 pen 2  . Insulin Pen Needle (BD ULTRA-FINE PEN NEEDLES) 29G X 12.7MM MISC To use to check blood sugar daily 200 each 3  . lisinopril (PRINIVIL,ZESTRIL) 40 MG tablet TAKE 1 TABLET EVERY DAY 90 tablet 3  . metoprolol succinate (TOPROL-XL) 100 MG 24 hr tablet TAKE 2 TABLETS BY MOUTH DAILY. TAKE WITH OR IMMEDIATELY FOLLOWING A MEAL. 180 tablet 3  . multivitamin (THERAGRAN) per tablet Take 1 tablet by mouth daily.      . NON FORMULARY Knee high compression hose 20-30 mmhg. Apply daily and remove at bedtime.     Marland Kitchen spironolactone (ALDACTONE) 50 MG tablet Take 1 tablet (50 mg total) by mouth 2 (two) times daily. 180 tablet 3  . warfarin (COUMADIN) 6 MG  tablet TAKE 1 TABLET EVERY DAY 90 tablet 3   No current facility-administered medications for this visit.     Past Medical History  Diagnosis Date  . Diabetes mellitus     Type II  . Hyperlipidemia   . Hypertension   . Arrhythmia 12/04    1. Ventricular tachycardia; Dr. [name]  2. Atrial fibrillation  . Obesity   . Hemorrhoids     hx of  . Personal history of colon cancer, stage III 1/09    Dr. Carron Brazen, D. [place]  . Neuropathy (HCC)     Oxaliplatin  . DVT (deep venous thrombosis) ([place]) 7/09    RLE, Tx Arixtra  . Sigmoid colon ulcer 5 yrs ago    Sigmoid; Nydia Bouton, MD (colon cancer)  . Sleep apnea     uses cpap setting of 10  . Chronic kidney disease     Complex L renal cyst, no tx for   . Umbilical hernia     ROS:   All systems reviewed and negative except as noted in the HPI.   Past Surgical History  Procedure Laterality Date  . Cardiac defibrillator placement    . Colectomy  1/09    Sigmoid; Nydia Bouton, MD (colon cancer)  . Cholecystectomy    . Colonoscopy    . Hernia repair  yrs ago    umb hernia  . Colonoscopy  N/A 02/25/2013    Procedure: COLONOSCOPY;  Surgeon: Gatha Mayer, MD;  Location: [facility];  Service: Endoscopy;  Laterality: N/A;     Family History  Problem Relation Age of Onset  . Diabetes Father   . Cancer Father     myelofibrosis  . Diabetes Maternal Grandmother   . Hypertension Neg Hx   . Coronary artery disease Neg Hx   . Prostate cancer Neg Hx   . Colon cancer Other   . Cancer Maternal Aunt     colon     Social History   Social History  . Marital Status: Married    Spouse Name: N/A  . Number of Children: 2  . Years of Education: N/A   Occupational History  . [employer]     Disabled   Social History Main Topics  . Smoking status: Never Smoker   . Smokeless tobacco: Never Used  . Alcohol Use: No  . Drug Use: No  . Sexual Activity: Yes   Other Topics Concern  . Not on file   Social History  Narrative   2 step children from wife's 2nd marriage.     BP 156/78 mmHg  Pulse 76  Ht 6\' 4"  (1.93 m)  Wt 457 lb (207.294 kg)  BMI 55.65 kg/m2  Physical Exam:  Well appearing morbidly obese, middle-aged man, NAD HEENT: Unremarkable Neck:   7 cm JVD, no thyromegally Lungs:  Clear with no wheezes, rales, or rhonchi. HEART:  Regular rate rhythm, no murmurs, no rubs, no clicks, distant heart sounds. Abd:  soft, obese, positive bowel sounds, no organomegally, no rebound, no guarding Ext:  2 plus pulses, 1+ peripheral edema, no cyanosis, no clubbing Skin:  No rashes no nodules Neuro:  CN II through XII intact, motor grossly intact  DEVICE  Normal device function.  See PaceArt for details.   Assess/Plan:  1. VF arrest - he is maintaining NSR 2. Atrial fib - he is maintaining NSR on amio 3. coags - he would like to stop warfarin and start Xarelto 4. ICD - his Medtronic device is working normally. Will recheck in several months.   [name].D.

## 2015-03-05 NOTE — Patient Instructions (Signed)
Medication Instructions:  Your physician has recommended you make the following change in your medication:  1) STOP Coumadin 2) START Xarelto 20 mg daily  (stop Coumadin 3 days prior to beginning this medication)  Labwork: None  ordered  Testing/Procedures: None ordered  Follow-Up: Remote monitoring is used to monitor your Pacemaker of ICD from home. This monitoring reduces the number of office visits required to check your device to one time per year. It allows [place] to keep an eye on the functioning of your device to ensure it is working properly. You are scheduled for a device check from home on 06/07/2015. You may send your transmission at any time that day. If you have a wireless device, the transmission will be sent automatically. After your physician reviews your transmission, you will receive a postcard with your next transmission date.  Your physician wants you to follow-up in: 1 year with Dr. [name].  You will receive a reminder letter in the mail two months in advance. If you don't receive a letter, please call our office to schedule the follow-up appointment.  If you need a refill on your cardiac medications before your next appointment, please call your pharmacy.  Thank you for choosing [facility]!!

## 2015-03-07 LAB — CUP PACEART INCLINIC DEVICE CHECK
Date Time Interrogation Session: 20170217172622
HIGH POWER IMPEDANCE MEASURED VALUE: 54 Ohm
HIGH POWER IMPEDANCE MEASURED VALUE: 68 Ohm
HighPow Impedance: 456 Ohm
Implantable Lead Model: 6949
Lead Channel Impedance Value: 551 Ohm
Lead Channel Pacing Threshold Amplitude: 0.75 V
Lead Channel Pacing Threshold Pulse Width: 0.4 ms
Lead Channel Sensing Intrinsic Amplitude: 11.75 mV
MDC IDC LEAD IMPLANT DT: 20050113
MDC IDC LEAD LOCATION: 753860
MDC IDC MSMT BATTERY VOLTAGE: 2.96 V
MDC IDC SET LEADCHNL RV PACING AMPLITUDE: 2.5 V
MDC IDC SET LEADCHNL RV PACING PULSEWIDTH: 0.4 ms
MDC IDC SET LEADCHNL RV SENSING SENSITIVITY: 0.3 mV
MDC IDC STAT BRADY RV PERCENT PACED: 0.5 %

## 2015-03-09 DIAGNOSIS — I10 Essential (primary) hypertension: Secondary | ICD-10-CM | POA: Diagnosis not present

## 2015-03-09 DIAGNOSIS — E1122 Type 2 diabetes mellitus with diabetic chronic kidney disease: Secondary | ICD-10-CM | POA: Diagnosis not present

## 2015-03-09 DIAGNOSIS — N183 Chronic kidney disease, stage 3 (moderate): Secondary | ICD-10-CM | POA: Diagnosis not present

## 2015-03-12 ENCOUNTER — Encounter: Payer: Self-pay | Admitting: Internal Medicine

## 2015-03-17 ENCOUNTER — Other Ambulatory Visit: Payer: Self-pay | Admitting: Internal Medicine

## 2015-03-17 ENCOUNTER — Encounter: Payer: Self-pay | Admitting: Internal Medicine

## 2015-03-18 ENCOUNTER — Ambulatory Visit: Payer: Self-pay

## 2015-03-19 ENCOUNTER — Telehealth: Payer: Self-pay

## 2015-03-19 NOTE — Telephone Encounter (Signed)
Tier exception for Xarelto 20mg  received from [facility].

## 2015-03-24 ENCOUNTER — Encounter: Payer: Self-pay | Admitting: Internal Medicine

## 2015-03-24 ENCOUNTER — Other Ambulatory Visit: Payer: Self-pay | Admitting: Internal Medicine

## 2015-03-24 MED ORDER — ACCU-CHEK FASTCLIX LANCETS MISC: Status: DC

## 2015-03-25 MED ORDER — OSELTAMIVIR PHOSPHATE 75 MG PO CAPS: 75.0000 mg | ORAL_CAPSULE | Freq: Every day | ORAL | Status: DC

## 2015-04-01 ENCOUNTER — Encounter: Payer: Self-pay | Admitting: Internal Medicine

## 2015-04-01 MED ORDER — INSULIN PEN NEEDLE 29G X 12.7MM MISC: Status: DC

## 2015-04-01 MED ORDER — ACCU-CHEK FASTCLIX LANCETS MISC: Status: DC

## 2015-04-01 NOTE — Telephone Encounter (Signed)
Rx sent electronically.  

## 2015-04-05 ENCOUNTER — Other Ambulatory Visit: Payer: Self-pay | Admitting: *Deleted

## 2015-04-05 MED ORDER — INSULIN PEN NEEDLE 29G X 12.7MM MISC: Status: DC

## 2015-04-07 DIAGNOSIS — N183 Chronic kidney disease, stage 3 (moderate): Secondary | ICD-10-CM | POA: Diagnosis not present

## 2015-04-15 DIAGNOSIS — R600 Localized edema: Secondary | ICD-10-CM | POA: Diagnosis not present

## 2015-04-15 DIAGNOSIS — N183 Chronic kidney disease, stage 3 (moderate): Secondary | ICD-10-CM | POA: Diagnosis not present

## 2015-04-15 DIAGNOSIS — I10 Essential (primary) hypertension: Secondary | ICD-10-CM | POA: Diagnosis not present

## 2015-04-15 DIAGNOSIS — N2581 Secondary hyperparathyroidism of renal origin: Secondary | ICD-10-CM | POA: Diagnosis not present

## 2015-04-15 DIAGNOSIS — E1122 Type 2 diabetes mellitus with diabetic chronic kidney disease: Secondary | ICD-10-CM | POA: Diagnosis not present

## 2015-05-31 ENCOUNTER — Encounter: Payer: Commercial Managed Care - HMO | Admitting: Internal Medicine

## 2015-06-07 ENCOUNTER — Ambulatory Visit (INDEPENDENT_AMBULATORY_CARE_PROVIDER_SITE_OTHER): Payer: Commercial Managed Care - HMO | Admitting: *Deleted

## 2015-06-07 ENCOUNTER — Telehealth: Payer: Self-pay | Admitting: Cardiology

## 2015-06-07 DIAGNOSIS — I4901 Ventricular fibrillation: Secondary | ICD-10-CM | POA: Diagnosis not present

## 2015-06-07 DIAGNOSIS — Z95810 Presence of automatic (implantable) cardiac defibrillator: Secondary | ICD-10-CM

## 2015-06-07 DIAGNOSIS — I5032 Chronic diastolic (congestive) heart failure: Secondary | ICD-10-CM | POA: Diagnosis not present

## 2015-06-07 NOTE — Telephone Encounter (Signed)
LMOVM reminding pt to send remote transmission.   

## 2015-06-08 NOTE — Progress Notes (Signed)
Remote ICD transmission.   

## 2015-06-25 LAB — CUP PACEART REMOTE DEVICE CHECK
Brady Statistic RV Percent Paced: 0.23 %
Date Time Interrogation Session: 20170523152423
HIGH POWER IMPEDANCE MEASURED VALUE: 456 Ohm
HIGH POWER IMPEDANCE MEASURED VALUE: 55 Ohm
HIGH POWER IMPEDANCE MEASURED VALUE: 71 Ohm
Lead Channel Impedance Value: 532 Ohm
Lead Channel Sensing Intrinsic Amplitude: 11.375 mV
Lead Channel Sensing Intrinsic Amplitude: 11.375 mV
Lead Channel Setting Pacing Pulse Width: 0.4 ms
MDC IDC LEAD IMPLANT DT: 20050113
MDC IDC LEAD LOCATION: 753860
MDC IDC LEAD MODEL: 6949
MDC IDC MSMT BATTERY VOLTAGE: 2.94 V
MDC IDC MSMT LEADCHNL RV PACING THRESHOLD AMPLITUDE: 0.75 V
MDC IDC MSMT LEADCHNL RV PACING THRESHOLD PULSEWIDTH: 0.4 ms
MDC IDC SET LEADCHNL RV PACING AMPLITUDE: 2.5 V
MDC IDC SET LEADCHNL RV SENSING SENSITIVITY: 0.3 mV

## 2015-07-01 ENCOUNTER — Encounter: Payer: Self-pay | Admitting: Cardiology

## 2015-07-16 ENCOUNTER — Encounter: Payer: Self-pay | Admitting: Cardiology

## 2015-07-16 LAB — HM DIABETES EYE EXAM

## 2015-07-28 ENCOUNTER — Encounter: Payer: Self-pay | Admitting: Internal Medicine

## 2015-07-28 DIAGNOSIS — E1140 Type 2 diabetes mellitus with diabetic neuropathy, unspecified: Secondary | ICD-10-CM

## 2015-07-28 DIAGNOSIS — E1165 Type 2 diabetes mellitus with hyperglycemia: Principal | ICD-10-CM

## 2015-07-28 DIAGNOSIS — IMO0002 Reserved for concepts with insufficient information to code with codable children: Secondary | ICD-10-CM

## 2015-07-29 MED ORDER — INSULIN ASPART PROT & ASPART (70-30 MIX) 100 UNIT/ML PEN: PEN_INJECTOR | SUBCUTANEOUS | Status: DC

## 2015-07-29 NOTE — Telephone Encounter (Signed)
Rx sent electronically. Advised pt he needs to schedule an OV to [facility].

## 2015-08-12 ENCOUNTER — Encounter: Payer: Commercial Managed Care - HMO | Admitting: Internal Medicine

## 2015-09-07 ENCOUNTER — Ambulatory Visit (INDEPENDENT_AMBULATORY_CARE_PROVIDER_SITE_OTHER): Payer: Commercial Managed Care - HMO | Admitting: *Deleted

## 2015-09-07 ENCOUNTER — Encounter: Payer: Commercial Managed Care - HMO | Admitting: Internal Medicine

## 2015-09-07 DIAGNOSIS — I4901 Ventricular fibrillation: Secondary | ICD-10-CM

## 2015-09-07 DIAGNOSIS — Z95810 Presence of automatic (implantable) cardiac defibrillator: Secondary | ICD-10-CM

## 2015-09-07 DIAGNOSIS — I5032 Chronic diastolic (congestive) heart failure: Secondary | ICD-10-CM

## 2015-09-07 NOTE — Progress Notes (Signed)
Remote ICD transmission.   

## 2015-09-13 DIAGNOSIS — H524 Presbyopia: Secondary | ICD-10-CM | POA: Diagnosis not present

## 2015-09-13 DIAGNOSIS — H5210 Myopia, unspecified eye: Secondary | ICD-10-CM | POA: Diagnosis not present

## 2015-09-13 LAB — HM DIABETES EYE EXAM

## 2015-09-15 ENCOUNTER — Encounter: Payer: Self-pay | Admitting: Cardiology

## 2015-09-15 DIAGNOSIS — N2581 Secondary hyperparathyroidism of renal origin: Secondary | ICD-10-CM | POA: Diagnosis not present

## 2015-09-15 DIAGNOSIS — N183 Chronic kidney disease, stage 3 (moderate): Secondary | ICD-10-CM | POA: Diagnosis not present

## 2015-09-15 DIAGNOSIS — I10 Essential (primary) hypertension: Secondary | ICD-10-CM | POA: Diagnosis not present

## 2015-09-15 DIAGNOSIS — E1122 Type 2 diabetes mellitus with diabetic chronic kidney disease: Secondary | ICD-10-CM | POA: Diagnosis not present

## 2015-09-15 DIAGNOSIS — R600 Localized edema: Secondary | ICD-10-CM | POA: Diagnosis not present

## 2015-09-16 LAB — CUP PACEART REMOTE DEVICE CHECK
Battery Voltage: 2.9 V
Brady Statistic RV Percent Paced: 0.3 %
HIGH POWER IMPEDANCE MEASURED VALUE: 513 Ohm
HighPow Impedance: 59 Ohm
HighPow Impedance: 72 Ohm
Implantable Lead Location: 753860
Implantable Lead Model: 6949
Lead Channel Impedance Value: 551 Ohm
Lead Channel Pacing Threshold Amplitude: 0.75 V
Lead Channel Setting Pacing Amplitude: 2.5 V
Lead Channel Setting Sensing Sensitivity: 0.3 mV
MDC IDC LEAD IMPLANT DT: 20050113
MDC IDC MSMT LEADCHNL RV PACING THRESHOLD PULSEWIDTH: 0.4 ms
MDC IDC MSMT LEADCHNL RV SENSING INTR AMPL: 11.875 mV
MDC IDC MSMT LEADCHNL RV SENSING INTR AMPL: 11.875 mV
MDC IDC SESS DTM: 20170822052311
MDC IDC SET LEADCHNL RV PACING PULSEWIDTH: 0.4 ms

## 2015-10-08 ENCOUNTER — Other Ambulatory Visit: Payer: Self-pay | Admitting: Internal Medicine

## 2015-10-08 DIAGNOSIS — E1140 Type 2 diabetes mellitus with diabetic neuropathy, unspecified: Secondary | ICD-10-CM

## 2015-10-08 DIAGNOSIS — IMO0002 Reserved for concepts with insufficient information to code with codable children: Secondary | ICD-10-CM

## 2015-10-08 DIAGNOSIS — E1165 Type 2 diabetes mellitus with hyperglycemia: Principal | ICD-10-CM

## 2015-11-09 ENCOUNTER — Other Ambulatory Visit: Payer: Self-pay | Admitting: Internal Medicine

## 2015-12-06 ENCOUNTER — Other Ambulatory Visit: Payer: Self-pay | Admitting: Internal Medicine

## 2015-12-06 DIAGNOSIS — E1140 Type 2 diabetes mellitus with diabetic neuropathy, unspecified: Secondary | ICD-10-CM

## 2015-12-06 DIAGNOSIS — E1165 Type 2 diabetes mellitus with hyperglycemia: Principal | ICD-10-CM

## 2015-12-06 DIAGNOSIS — IMO0002 Reserved for concepts with insufficient information to code with codable children: Secondary | ICD-10-CM

## 2015-12-06 NOTE — Telephone Encounter (Signed)
Left detailed message on VM per DOR that pt needs to call the office to schedule a diabetes follow-up. Since the refill is from mail order, I will send 90 days, but he has to schedule an appt before those run out.

## 2015-12-07 ENCOUNTER — Ambulatory Visit (INDEPENDENT_AMBULATORY_CARE_PROVIDER_SITE_OTHER): Payer: Commercial Managed Care - HMO | Admitting: *Deleted

## 2015-12-07 ENCOUNTER — Telehealth: Payer: Self-pay | Admitting: Cardiology

## 2015-12-07 DIAGNOSIS — I5032 Chronic diastolic (congestive) heart failure: Secondary | ICD-10-CM | POA: Diagnosis not present

## 2015-12-07 DIAGNOSIS — I4901 Ventricular fibrillation: Secondary | ICD-10-CM

## 2015-12-07 NOTE — Telephone Encounter (Signed)
Spoke with pt and reminded pt of remote transmission that is due today. Pt verbalized understanding.   

## 2015-12-07 NOTE — Progress Notes (Signed)
Remote ICD transmission.   

## 2015-12-13 ENCOUNTER — Other Ambulatory Visit: Payer: Self-pay | Admitting: Internal Medicine

## 2015-12-13 DIAGNOSIS — IMO0002 Reserved for concepts with insufficient information to code with codable children: Secondary | ICD-10-CM

## 2015-12-13 DIAGNOSIS — E1165 Type 2 diabetes mellitus with hyperglycemia: Principal | ICD-10-CM

## 2015-12-13 DIAGNOSIS — E1140 Type 2 diabetes mellitus with diabetic neuropathy, unspecified: Secondary | ICD-10-CM

## 2015-12-16 ENCOUNTER — Encounter: Payer: Self-pay | Admitting: Cardiology

## 2015-12-22 ENCOUNTER — Other Ambulatory Visit: Payer: Self-pay | Admitting: Internal Medicine

## 2015-12-22 LAB — CUP PACEART REMOTE DEVICE CHECK
Battery Voltage: 2.86 V
Brady Statistic RV Percent Paced: 0.53 %
HIGH POWER IMPEDANCE MEASURED VALUE: 456 Ohm
HIGH POWER IMPEDANCE MEASURED VALUE: 54 Ohm
HighPow Impedance: 71 Ohm
Implantable Lead Model: 6949
Lead Channel Impedance Value: 532 Ohm
Lead Channel Sensing Intrinsic Amplitude: 11.875 mV
Lead Channel Setting Pacing Amplitude: 2.5 V
Lead Channel Setting Pacing Pulse Width: 0.4 ms
Lead Channel Setting Sensing Sensitivity: 0.3 mV
MDC IDC LEAD IMPLANT DT: 20050113
MDC IDC LEAD LOCATION: 753860
MDC IDC MSMT LEADCHNL RV PACING THRESHOLD AMPLITUDE: 0.875 V
MDC IDC MSMT LEADCHNL RV PACING THRESHOLD PULSEWIDTH: 0.4 ms
MDC IDC MSMT LEADCHNL RV SENSING INTR AMPL: 11.875 mV
MDC IDC PG IMPLANT DT: 20110706
MDC IDC SESS DTM: 20171121175815

## 2016-01-18 ENCOUNTER — Encounter: Payer: Self-pay | Admitting: Internal Medicine

## 2016-01-18 DIAGNOSIS — K429 Umbilical hernia without obstruction or gangrene: Secondary | ICD-10-CM

## 2016-01-31 ENCOUNTER — Other Ambulatory Visit: Payer: Self-pay | Admitting: Internal Medicine

## 2016-01-31 DIAGNOSIS — IMO0002 Reserved for concepts with insufficient information to code with codable children: Secondary | ICD-10-CM

## 2016-01-31 DIAGNOSIS — E1165 Type 2 diabetes mellitus with hyperglycemia: Principal | ICD-10-CM

## 2016-01-31 DIAGNOSIS — E1140 Type 2 diabetes mellitus with diabetic neuropathy, unspecified: Secondary | ICD-10-CM

## 2016-02-15 ENCOUNTER — Other Ambulatory Visit: Payer: Self-pay | Admitting: Internal Medicine

## 2016-02-23 ENCOUNTER — Encounter: Payer: Self-pay | Admitting: Internal Medicine

## 2016-02-23 ENCOUNTER — Ambulatory Visit (INDEPENDENT_AMBULATORY_CARE_PROVIDER_SITE_OTHER): Payer: Medicare HMO | Admitting: Internal Medicine

## 2016-02-23 VITALS — BP 128/80 | HR 73 | Temp 98.3°F | Ht 73.75 in | Wt >= 6400 oz

## 2016-02-23 DIAGNOSIS — Z23 Encounter for immunization: Secondary | ICD-10-CM

## 2016-02-23 DIAGNOSIS — I4891 Unspecified atrial fibrillation: Secondary | ICD-10-CM | POA: Diagnosis not present

## 2016-02-23 DIAGNOSIS — E1140 Type 2 diabetes mellitus with diabetic neuropathy, unspecified: Secondary | ICD-10-CM

## 2016-02-23 DIAGNOSIS — E1121 Type 2 diabetes mellitus with diabetic nephropathy: Secondary | ICD-10-CM

## 2016-02-23 DIAGNOSIS — I5032 Chronic diastolic (congestive) heart failure: Secondary | ICD-10-CM | POA: Diagnosis not present

## 2016-02-23 DIAGNOSIS — Z0000 Encounter for general adult medical examination without abnormal findings: Secondary | ICD-10-CM | POA: Diagnosis not present

## 2016-02-23 DIAGNOSIS — I472 Ventricular tachycardia, unspecified: Secondary | ICD-10-CM

## 2016-02-23 DIAGNOSIS — Z6841 Body Mass Index (BMI) 40.0 and over, adult: Secondary | ICD-10-CM

## 2016-02-23 LAB — CBC WITH DIFFERENTIAL/PLATELET
Basophils Absolute: 0 K/uL (ref 0.0–0.1)
Basophils Relative: 0.5 % (ref 0.0–3.0)
Eosinophils Absolute: 0.2 K/uL (ref 0.0–0.7)
Eosinophils Relative: 2.1 % (ref 0.0–5.0)
HCT: 50.6 % (ref 39.0–52.0)
Hemoglobin: 16.8 g/dL (ref 13.0–17.0)
Lymphocytes Relative: 32.1 % (ref 12.0–46.0)
Lymphs Abs: 2.9 K/uL (ref 0.7–4.0)
MCHC: 33.1 g/dL (ref 30.0–36.0)
MCV: 94 fl (ref 78.0–100.0)
Monocytes Absolute: 0.8 K/uL (ref 0.1–1.0)
Monocytes Relative: 9.2 % (ref 3.0–12.0)
Neutro Abs: 5.1 K/uL (ref 1.4–7.7)
Neutrophils Relative %: 56.1 % (ref 43.0–77.0)
Platelets: 150 K/uL (ref 150.0–400.0)
RBC: 5.39 Mil/uL (ref 4.22–5.81)
RDW: 14.8 % (ref 11.5–15.5)
WBC: 9.1 K/uL (ref 4.0–10.5)

## 2016-02-23 LAB — COMPREHENSIVE METABOLIC PANEL
ALK PHOS: 65 U/L (ref 39–117)
ALT: 25 U/L (ref 0–53)
AST: 18 U/L (ref 0–37)
Albumin: 4 g/dL (ref 3.5–5.2)
BUN: 27 mg/dL — AB (ref 6–23)
CHLORIDE: 107 meq/L (ref 96–112)
CO2: 26 mEq/L (ref 19–32)
Calcium: 9.1 mg/dL (ref 8.4–10.5)
Creatinine, Ser: 1.66 mg/dL — ABNORMAL HIGH (ref 0.40–1.50)
GFR: 45.81 mL/min — ABNORMAL LOW (ref >60.00)
GLUCOSE: 148 mg/dL — AB (ref 70–99)
POTASSIUM: 4.3 meq/L (ref 3.5–5.1)
SODIUM: 139 meq/L (ref 135–145)
TOTAL PROTEIN: 7.6 g/dL (ref 6.0–8.3)
Total Bilirubin: 0.5 mg/dL (ref 0.2–1.2)

## 2016-02-23 LAB — LIPID PANEL
CHOLESTEROL: 142 mg/dL (ref 0–200)
HDL: 33.8 mg/dL — ABNORMAL LOW (ref >39.00)
LDL Cholesterol: 71 mg/dL (ref 0–99)
NONHDL: 108.44
Total CHOL/HDL Ratio: 4
Triglycerides: 187 mg/dL — ABNORMAL HIGH (ref 0.0–149.0)
VLDL: 37.4 mg/dL (ref 0.0–40.0)

## 2016-02-23 LAB — T4, FREE: Free T4: 1 ng/dL (ref 0.60–1.60)

## 2016-02-23 LAB — HEMOGLOBIN A1C: Hgb A1c MFr Bld: 8.3 % — ABNORMAL HIGH (ref 4.6–6.5)

## 2016-02-23 LAB — HM DIABETES FOOT EXAM

## 2016-02-23 MED ORDER — ATORVASTATIN CALCIUM 10 MG PO TABS: ORAL_TABLET | ORAL | 3 refills | Status: DC

## 2016-02-23 MED ORDER — AMIODARONE HCL 200 MG PO TABS: 200.0000 mg | ORAL_TABLET | Freq: Every day | ORAL | 3 refills | Status: DC

## 2016-02-23 MED ORDER — LISINOPRIL 40 MG PO TABS: 40.0000 mg | ORAL_TABLET | Freq: Every day | ORAL | 3 refills | Status: DC

## 2016-02-23 MED ORDER — METOPROLOL SUCCINATE ER 100 MG PO TB24: ORAL_TABLET | ORAL | 3 refills | Status: DC

## 2016-02-23 MED ORDER — ACCU-CHEK FASTCLIX LANCETS MISC: 3 refills | Status: DC

## 2016-02-23 MED ORDER — GLIPIZIDE ER 10 MG PO TB24: 10.0000 mg | ORAL_TABLET | Freq: Every day | ORAL | 3 refills | Status: DC

## 2016-02-23 MED ORDER — DILTIAZEM HCL ER 240 MG PO CP24: 240.0000 mg | ORAL_CAPSULE | Freq: Every morning | ORAL | 0 refills | Status: DC

## 2016-02-23 MED ORDER — FUROSEMIDE 40 MG PO TABS: ORAL_TABLET | ORAL | 3 refills | Status: DC

## 2016-02-23 NOTE — Assessment & Plan Note (Signed)
Planning bariatric surgery

## 2016-02-23 NOTE — Assessment & Plan Note (Signed)
On ACEI Follows with Dr Holley Raring

## 2016-02-23 NOTE — Assessment & Plan Note (Signed)
On amiodarone and xarelto

## 2016-02-23 NOTE — Progress Notes (Signed)
Pre visit review using our clinic review tool, if applicable. No additional management support is needed unless otherwise documented below in the visit note. 

## 2016-02-23 NOTE — Progress Notes (Signed)
Subjective:    Patient ID: Craig Lindsey, male    DOB: 1960-06-13, 56 y.o.   MRN: [id number]  HPI Here for Medicare wellness (first) and follow up of chronic health conditions Reviewed form and advanced directives Reviewed other physicians No tobacco or alcohol Vision okay---exam June or July by Dr Chong Sicilian, no retinopathy Hearing is okay No falls Tries to walk but very limited No depression or anhedonia Independent with instrumental ADLs No sig memory issues  Planning to see Dr Lucia Gaskins and pursue bariatric surgery Not sure which procedure yet Recurrent weight loss efforts have been ineffective  Checks sugars intermittently Seems higher in some afternoons Fasting usually 140-170 He does adjust insulin dosing Only one hypoglycemic spell Feet remain numb--but rarely painful  No heart problems No V tach noted---hasn't needed a shock at all No palpitations No dizziness or syncope No edema  Current Outpatient Prescriptions on File Prior to Visit  Medication Sig Dispense Refill  . ACCU-CHEK FASTCLIX LANCETS MISC Use to check blood sugar three times a day.  Dx: E11.40 306 each 3  . amiodarone (PACERONE) 200 MG tablet TAKE 1 TABLET EVERY DAY 90 tablet 3  . atorvastatin (LIPITOR) 10 MG tablet TAKE 1 TABLET DAILY AT 6:00PM 90 tablet 0  . BD ULTRA-FINE PEN NEEDLES 29G X 12.7MM MISC USE  TO INJECT TWICE DAILY  TO THREE TIMES DAILY 270 each 3  . DILT-XR 240 MG 24 hr capsule TAKE 1 CAPSULE EVERY MORNING. 90 capsule 0  . furosemide (LASIX) 40 MG tablet Take one tablet by mouth daily and as needed 135 tablet 3  . glipiZIDE (GLUCOTROL XL) 10 MG 24 hr tablet TAKE 1 TABLET  DAILY WITH BREAKFAST. 90 tablet 0  . lisinopril (PRINIVIL,ZESTRIL) 40 MG tablet TAKE 1 TABLET EVERY DAY 90 tablet 0  . metoprolol succinate (TOPROL-XL) 100 MG 24 hr tablet TAKE 2 TABLETS  DAILY. TAKE WITH OR IMMEDIATELY FOLLOWING A MEAL. 180 tablet 0  . multivitamin (THERAGRAN) per tablet Take 1 tablet by mouth daily.       . NON FORMULARY Knee high compression hose 20-30 mmhg. Apply daily and remove at bedtime.     Marland Kitchen NOVOLOG MIX 70/30 FLEXPEN (70-30) 100 UNIT/ML FlexPen USE SLIDING SCALE BETWEEN 50 TO 70 UNITS TWICE DAILY (WITH BREAKFAST AND SUPPER). MAX DAILY DOSE 150 UNITS 45 mL 5  . rivaroxaban (XARELTO) 20 MG TABS tablet Take 1 tablet (20 mg total) by mouth daily with supper. 90 tablet 3   No current facility-administered medications on file prior to visit.     No Known Allergies  Past Medical History:  Diagnosis Date  . Arrhythmia 12/04   1. Ventricular tachycardia; Dr. [name]  2. Atrial fibrillation  . Chronic kidney disease    Complex L renal cyst, no tx for   . Diabetes mellitus    Type II  . DVT (deep venous thrombosis) ([place]) 7/09   RLE, Tx Arixtra  . Hemorrhoids    hx of  . Hyperlipidemia   . Hypertension   . Neuropathy (HCC)    Oxaliplatin  . Obesity   . Personal history of colon cancer, stage III 1/09   Dr. Carlean Purl, Benay Spice, D. Newman  . Sigmoid colon ulcer 5 yrs ago   Sigmoid; Nydia Bouton, MD (colon cancer)  . Sleep apnea    uses cpap setting of 10  . Umbilical hernia     Past Surgical History:  Procedure Laterality Date  . CARDIAC DEFIBRILLATOR PLACEMENT    .  CHOLECYSTECTOMY    . COLECTOMY  1/09   Sigmoid; Nydia Bouton, MD (colon cancer)  . COLONOSCOPY    . COLONOSCOPY N/A 02/25/2013   Procedure: COLONOSCOPY;  Surgeon: Gatha Mayer, MD;  Location: [facility];  Service: Endoscopy;  Laterality: N/A;  . HERNIA REPAIR  yrs ago   umb hernia    Family History  Problem Relation Age of Onset  . Diabetes Father   . Cancer Father     myelofibrosis  . Colon cancer Other   . Diabetes Maternal Grandmother   . Cancer Maternal Aunt     colon  . Hypertension Neg Hx   . Coronary artery disease Neg Hx   . Prostate cancer Neg Hx     Social History   Social History  . Marital status: Married    Spouse name: N/A  . Number of children: 2  . Years of education: N/A    Occupational History  . [employer]     Disabled   Social History Main Topics  . Smoking status: Never Smoker  . Smokeless tobacco: Never Used  . Alcohol use No  . Drug use: No  . Sexual activity: Yes   Other Topics Concern  . Not on file   Social History Narrative   2 step children from wife's 2nd marriage.      Has living will   Wife is health care POA   Would accept resuscitation attempts   Not sure about tube feedings   Review of Systems  Sold house--moved in with mom for a while (then will find someplace else) Weight back up some Sleeps okay with CPAP--uses every night with good effect (?11cm H2O) Voids okay. Wears seat belt Teeth okay--- overdue for dentist [facility]) Bowels are fine. No blood in stool Some dry skin on hands. No suspicious lesions Ongoing knee pain--hopes weight loss will help. Only uses tylenol No heartburn or dysphagia     Objective:   Physical Exam  Constitutional: He is oriented to person, place, and time. He appears well-developed. No distress.  HENT:  Mouth/Throat: Oropharynx is clear and moist. No oropharyngeal exudate.  Neck: Normal range of motion. Neck supple. No thyromegaly present.  Cardiovascular: Normal rate, regular rhythm, normal heart sounds and intact distal pulses.  Exam reveals no gallop.   No murmur heard. Pulmonary/Chest: Effort normal and breath sounds normal. No respiratory distress. He has no wheezes. He has no rales.  Abdominal: Soft. There is no tenderness.  Musculoskeletal: He exhibits no tenderness.  Lymphadenopathy:    He has no cervical adenopathy.  Neurological: He is alert and oriented to person, place, and time.  President--- "[name], [place]--- [name] [name] Recall 1/3  Decreased sensation in feet  Skin:  Trace edema with sig stasis changes in calves Mild plantar callous but no ulcers  Psychiatric: He has a normal mood and affect. His behavior is normal.           Assessment & Plan:

## 2016-02-23 NOTE — Assessment & Plan Note (Signed)
I have personally reviewed the Medicare Annual Wellness questionnaire and have noted 1. The patient's medical and social history 2. Their use of alcohol, tobacco or illicit drugs 3. Their current medications and supplements 4. The patient's functional ability including ADL's, fall risks, home safety risks and hearing or visual             impairment. 5. Diet and physical activities 6. Evidence for depression or mood disorders  The patients weight, height, BMI and visual acuity have been recorded in the chart I have made referrals, counseling and provided education to the patient based review of the above and I have provided the pt with a written personalized care plan for preventive services.  I have provided you with a copy of your personalized plan for preventive services. Please take the time to review along with your updated medication list.  Flu vaccine and prevnar Discussed PSA--will defer Due for colon 2020---colon cancer surveillance Working on fitness---bariatric surgery planned

## 2016-02-23 NOTE — Assessment & Plan Note (Signed)
Hopefully still controlled Would consider incretin therapy --but might hold off if having the surgery

## 2016-02-23 NOTE — Assessment & Plan Note (Signed)
No apparent recurrence per ICD

## 2016-02-23 NOTE — Addendum Note (Signed)
Addended by: Pilar Grammes on: 02/23/2016 01:12 PM   Modules accepted: Orders

## 2016-02-23 NOTE — Assessment & Plan Note (Signed)
Compensated. No changes needed. 

## 2016-02-25 DIAGNOSIS — G4733 Obstructive sleep apnea (adult) (pediatric): Secondary | ICD-10-CM | POA: Diagnosis not present

## 2016-02-25 DIAGNOSIS — Z7901 Long term (current) use of anticoagulants: Secondary | ICD-10-CM | POA: Diagnosis not present

## 2016-02-25 DIAGNOSIS — E669 Obesity, unspecified: Secondary | ICD-10-CM | POA: Diagnosis not present

## 2016-02-25 DIAGNOSIS — E1169 Type 2 diabetes mellitus with other specified complication: Secondary | ICD-10-CM | POA: Diagnosis not present

## 2016-02-25 DIAGNOSIS — K432 Incisional hernia without obstruction or gangrene: Secondary | ICD-10-CM | POA: Diagnosis not present

## 2016-02-29 ENCOUNTER — Other Ambulatory Visit (HOSPITAL_COMMUNITY): Payer: Self-pay | Admitting: Surgery

## 2016-02-29 ENCOUNTER — Other Ambulatory Visit: Payer: Self-pay | Admitting: Internal Medicine

## 2016-02-29 NOTE — Telephone Encounter (Signed)
Pt requested Xarelto 20mg  refill; Cre-1.66, Hgb-16.8 & HCt-50.6 on 02/23/16, Wt-213.2kg, CrCl-151.62; per dosing criteria pt is on correct dose & will refill med. Next appt on 04/04/16 with Dr. [name].

## 2016-03-01 ENCOUNTER — Encounter: Payer: Self-pay | Admitting: Internal Medicine

## 2016-03-02 NOTE — Telephone Encounter (Signed)
Please fax the order I wrote. They will probably need a copy of my last OV note where the CPAP is discussed

## 2016-03-02 NOTE — Telephone Encounter (Signed)
Craig Lindsey with [facility] left v/m; received the cpap request and he needs additional info; needs pressure setting for c pap and needs office notes within the last 6 months that notes usage and benefits of cpap therapy. Craig Lindsey can be called at [phone] x [phone] and fax # [phone].

## 2016-03-03 NOTE — Telephone Encounter (Signed)
02-23-16 ov note faxed to [facility]

## 2016-03-07 DIAGNOSIS — G4733 Obstructive sleep apnea (adult) (pediatric): Secondary | ICD-10-CM | POA: Diagnosis not present

## 2016-03-08 ENCOUNTER — Ambulatory Visit
Admission: RE | Admit: 2016-03-08 | Discharge: 2016-03-08 | Disposition: A | Discharge status: Home / Self Care | Payer: Medicare HMO | Source: Ambulatory Visit | Attending: Surgery | Admitting: Surgery

## 2016-03-08 DIAGNOSIS — Z01818 Encounter for other preprocedural examination: Secondary | ICD-10-CM | POA: Diagnosis not present

## 2016-03-08 DIAGNOSIS — G4733 Obstructive sleep apnea (adult) (pediatric): Secondary | ICD-10-CM | POA: Diagnosis not present

## 2016-03-08 LAB — HM DIABETES EYE EXAM

## 2016-03-20 ENCOUNTER — Encounter: Payer: Self-pay | Admitting: Internal Medicine

## 2016-03-20 MED ORDER — GLUCOSE BLOOD VI STRP: ORAL_STRIP | 12 refills | Status: DC

## 2016-03-28 ENCOUNTER — Encounter: Payer: Self-pay | Admitting: Internal Medicine

## 2016-03-29 ENCOUNTER — Encounter: Payer: Self-pay | Admitting: Skilled Nursing Facility1

## 2016-03-29 ENCOUNTER — Encounter: Payer: Medicare HMO | Attending: Surgery | Admitting: Skilled Nursing Facility1

## 2016-03-29 DIAGNOSIS — E1140 Type 2 diabetes mellitus with diabetic neuropathy, unspecified: Secondary | ICD-10-CM | POA: Insufficient documentation

## 2016-03-29 DIAGNOSIS — E785 Hyperlipidemia, unspecified: Secondary | ICD-10-CM | POA: Insufficient documentation

## 2016-03-29 DIAGNOSIS — G4730 Sleep apnea, unspecified: Secondary | ICD-10-CM | POA: Insufficient documentation

## 2016-03-29 DIAGNOSIS — I110 Hypertensive heart disease with heart failure: Secondary | ICD-10-CM | POA: Insufficient documentation

## 2016-03-29 DIAGNOSIS — I5032 Chronic diastolic (congestive) heart failure: Secondary | ICD-10-CM | POA: Insufficient documentation

## 2016-03-29 DIAGNOSIS — Z6841 Body Mass Index (BMI) 40.0 and over, adult: Secondary | ICD-10-CM | POA: Insufficient documentation

## 2016-03-29 DIAGNOSIS — Z713 Dietary counseling and surveillance: Secondary | ICD-10-CM | POA: Diagnosis not present

## 2016-03-29 DIAGNOSIS — E119 Type 2 diabetes mellitus without complications: Secondary | ICD-10-CM

## 2016-03-29 NOTE — Progress Notes (Signed)
Pre-Op Assessment Visit:  Pre-Operative RYGB Surgery  Medical Nutrition Therapy:  Appt start time: 1:55  End time:  2:46  Patient was seen on 03/29/2016 for Pre-Operative Nutrition Assessment. Assessment and letter of approval faxed to [facility] Bariatric Surgery Program coordinator on 03/29/2016.   Pt states he has had diabetes for about 6 years. Pt states he checks his blood sugar 2 times a day: fasting 140 and post-prandial 205. Pt states he has a bad knee which keeps him from being active. Pt states he wants to work on cutting out sodas for the next month.   Start weight at [facility]: 467 pounds BMI: 61.61  24 hr Dietary Recall: First Meal: 2 eggs, 2 sausage, 1 piece bread Snack:  Second Meal: ham sandwich with cheese Snack: chips Third Meal: chicken---beef with vegetable and starch Snack:  Beverages: water, pepsi zero, 2 %milk  Encouraged to engage in 50 minutes of moderate physical activity including cardiovascular and weight baring weekly  Handouts given during visit include:  . Pre-Op Goals . Bariatric Surgery Protein Shakes During the appointment today the following Pre-Op Goals were reviewed with the patient: . Maintain or lose weight as instructed by your surgeon . Make healthy food choices . Begin to limit portion sizes . Limited concentrated sugars and fried foods . Keep fat/sugar in the single digits per serving on          food labels . Practice CHEWING your food  (aim for 30 chews per bite or until applesauce consistency) . Practice not drinking 15 minutes before, during, and 30 minutes after each meal/snack . Avoid all carbonated beverages  . Avoid/limit caffeinated beverages  . Avoid all sugar-sweetened beverages . Consume 3 meals per day; eat every 3-5 hours . Make a list of non-food related activities . Aim for 64-100 ounces of FLUID daily  . Aim for at least 60-80 grams of PROTEIN daily . Look for a liquid protein source that contain ?15 g  protein and ?5 g carbohydrate  (ex: shakes, drinks, shots)  -Follow diet recommendations listed below   Energy and Macronutrient Recomendations: Calories: 1800 Carbohydrate: 200 Protein: 135 Fat: 50  Demonstrated degree of understanding via:  Teach Back  Teaching Method Utilized:  Visual Auditory Hands on  Barriers to learning/adherence to lifestyle change: none identified   Patient to call the Nutrition and Diabetes Education Services to enroll in Pre-Op and Post-Op Nutrition Education when surgery date is scheduled.

## 2016-03-29 NOTE — Patient Instructions (Addendum)
Follow Pre-Op Goals Try Protein Shakes Call [facility] at [phone] when surgery is scheduled to enroll in Pre-Op Class  Things to remember:  Please always be honest with [place]. We want to support you!  If you have any questions or concerns in between appointments, please call or email   The diet after surgery will be high protein and low in carbohydrate.  Vitamins and calcium need to be taken for the rest of your life.  Feel free to include support people in any classes or appointments.  -Look into water aerobics    Supplement recommendations:  Before Surgery   1 Complete Multivitamin with Iron  3000 IU Vitamin D3  After Surgery   2 Chewable Multivitamins  **Best Choice - Bariatric Advantage Advanced Multi EA      3 Chewable Calcium (500 mg each, total 1200-1500 mg per day)  **Best Choice - Celebrate, Bariatric Advantage, or Wellesse  Other Options:  2 Celebrate MultiComplete with 18 mg Iron (this provides 6000 IU of  Vitamin D3)  4 Celebrate Essential Multi 2 in 1 (has calcium) + 18-60 mg separate  iron  Vitamins and Calcium are available at:   [facility]   [address], [place], [place] [address]   [email or web address]  [email or web address]  www.amazon.com

## 2016-04-04 ENCOUNTER — Encounter: Payer: Self-pay | Admitting: Internal Medicine

## 2016-04-04 ENCOUNTER — Ambulatory Visit (INDEPENDENT_AMBULATORY_CARE_PROVIDER_SITE_OTHER): Payer: Medicare HMO | Admitting: Internal Medicine

## 2016-04-04 VITALS — BP 148/68 | HR 70 | Ht 76.0 in | Wt >= 6400 oz

## 2016-04-04 DIAGNOSIS — I5032 Chronic diastolic (congestive) heart failure: Secondary | ICD-10-CM | POA: Diagnosis not present

## 2016-04-04 DIAGNOSIS — G4733 Obstructive sleep apnea (adult) (pediatric): Secondary | ICD-10-CM | POA: Diagnosis not present

## 2016-04-04 DIAGNOSIS — I4891 Unspecified atrial fibrillation: Secondary | ICD-10-CM | POA: Diagnosis not present

## 2016-04-04 DIAGNOSIS — I472 Ventricular tachycardia, unspecified: Secondary | ICD-10-CM

## 2016-04-04 LAB — CUP PACEART INCLINIC DEVICE CHECK
Date Time Interrogation Session: 20180320170253
HIGH POWER IMPEDANCE MEASURED VALUE: 418 Ohm
HIGH POWER IMPEDANCE MEASURED VALUE: 53 Ohm
HIGH POWER IMPEDANCE MEASURED VALUE: 69 Ohm
Implantable Lead Implant Date: 20050113
Lead Channel Pacing Threshold Amplitude: 1 V
Lead Channel Pacing Threshold Pulse Width: 0.4 ms
Lead Channel Sensing Intrinsic Amplitude: 11.5 mV
Lead Channel Sensing Intrinsic Amplitude: 12.375 mV
Lead Channel Setting Sensing Sensitivity: 0.3 mV
MDC IDC LEAD LOCATION: 753860
MDC IDC MSMT BATTERY VOLTAGE: 2.76 V
MDC IDC MSMT LEADCHNL RV IMPEDANCE VALUE: 513 Ohm
MDC IDC PG IMPLANT DT: 20110706
MDC IDC SET LEADCHNL RV PACING AMPLITUDE: 2.5 V
MDC IDC SET LEADCHNL RV PACING PULSEWIDTH: 0.4 ms
MDC IDC STAT BRADY RV PERCENT PACED: 0.45 %

## 2016-04-04 NOTE — Patient Instructions (Addendum)
Medication Instructions:  Your physician recommends that you continue on your current medications as directed. Please refer to the Current Medication list given to you today.   Labwork: None ordered  Testing/Procedures: None ordered  Follow-Up: Remote monitoring is used to monitor your ICD from home. This monitoring reduces the number of office visits required to check your device to one time per year. It allows [place] to keep an eye on the functioning of your device to ensure it is working properly. You are scheduled for a device check from home on 07/04/16. You may send your transmission at any time that day. If you have a wireless device, the transmission will be sent automatically. After your physician reviews your transmission, you will receive a postcard with your next transmission date.  Your physician wants you to follow-up in: 1 year with Dr. [name]. You will receive a reminder letter in the mail two months in advance. If you don't receive a letter, please call our office to schedule the follow-up appointment.   Any Other Special Instructions Will Be Listed Below (If Applicable).     If you need a refill on your cardiac medications before your next appointment, please call your pharmacy.

## 2016-04-04 NOTE — Progress Notes (Signed)
HPI Craig Lindsey returns today for followup. He is a 56 year old man with morbid obesity, paroxysmal atrial fibrillation, ventricular fibrillation, status post ICD implantation. The patient has a history of colon cancer, status post resection and chemotherapy. He has been cancer free for over seven years. He has had negative colonoscopies. He does not have much in the way of heart failure symptoms, class II diastolic heart failure. He has done a better job of controlling his fluid over the past year.  He has modest peripheral edema. No syncope or recent ICD shock. He is pursuing bariatric surgery which will hopefully be performed later this year. No Known Allergies   Current Outpatient Prescriptions  Medication Sig Dispense Refill  . ACCU-CHEK FASTCLIX LANCETS MISC Use to check blood sugar three times a day.  Dx: E11.40 306 each 3  . amiodarone (PACERONE) 200 MG tablet Take 1 tablet (200 mg total) by mouth daily. 90 tablet 3  . atorvastatin (LIPITOR) 10 MG tablet Take 10 mg by mouth daily at 6 PM.    . BD ULTRA-FINE PEN NEEDLES 29G X 12.7MM MISC USE  TO INJECT TWICE DAILY  TO THREE TIMES DAILY 270 each 3  . diltiazem (DILT-XR) 240 MG 24 hr capsule Take 1 capsule (240 mg total) by mouth every morning. 90 capsule 0  . furosemide (LASIX) 40 MG tablet Take one tablet by mouth daily and as needed 135 tablet 3  . glipiZIDE (GLUCOTROL XL) 10 MG 24 hr tablet Take 1 tablet (10 mg total) by mouth daily with breakfast. 90 tablet 3  . glucose blood (ACCU-CHEK SMARTVIEW) test strip Use as instructed to test blood sugar three times daily E11.21 100 each 12  . lisinopril (PRINIVIL,ZESTRIL) 40 MG tablet Take 1 tablet (40 mg total) by mouth daily. 90 tablet 3  . metoprolol succinate (TOPROL-XL) 100 MG 24 hr tablet TAKE 2 TABLETS BY MOUTH DAILY. TAKE WITH OR IMMEDIATELY FOLLOWING A MEAL.    . multivitamin (THERAGRAN) per tablet Take 1 tablet by mouth daily.      . NON FORMULARY Knee high compression hose 20-30 mmhg.  Apply daily and remove at bedtime.     Marland Kitchen NOVOLOG MIX 70/30 FLEXPEN (70-30) 100 UNIT/ML FlexPen USE SLIDING SCALE BETWEEN 50 TO 70 UNITS TWICE DAILY (WITH BREAKFAST AND SUPPER). MAX DAILY DOSE 150 UNITS 45 mL 5  . rivaroxaban (XARELTO) 20 MG TABS tablet TAKE 1 TABLET BY MOUTH DAILY WITH SUPPER     No current facility-administered medications for this visit.      Past Medical History:  Diagnosis Date  . Arrhythmia 12/04   1. Ventricular tachycardia; Dr. [name]  2. Atrial fibrillation  . Chronic kidney disease    Complex L renal cyst, no tx for   . Diabetes mellitus    Type II  . DVT (deep venous thrombosis) ([place]) 7/09   RLE, Tx Arixtra  . Hemorrhoids    hx of  . Hyperlipidemia   . Hypertension   . Neuropathy (HCC)    Oxaliplatin  . Obesity   . Personal history of colon cancer, stage III 1/09   Dr. Carlean Purl, Benay Spice, D. Newman  . Sigmoid colon ulcer 5 yrs ago   Sigmoid; Nydia Bouton, MD (colon cancer)  . Sleep apnea    uses cpap setting of 10  . Umbilical hernia     ROS:   All systems reviewed and negative except as noted in the HPI.   Past Surgical History:  Procedure Laterality Date  . CARDIAC DEFIBRILLATOR  PLACEMENT    . CHOLECYSTECTOMY    . COLECTOMY  1/09   Sigmoid; Nydia Bouton, MD (colon cancer)  . COLONOSCOPY    . COLONOSCOPY N/A 02/25/2013   Procedure: COLONOSCOPY;  Surgeon: Gatha Mayer, MD;  Location: [facility];  Service: Endoscopy;  Laterality: N/A;  . HERNIA REPAIR  yrs ago   umb hernia     Family History  Problem Relation Age of Onset  . Diabetes Father   . Cancer Father     myelofibrosis  . Colon cancer Other   . Diabetes Maternal Grandmother   . Cancer Maternal Aunt     colon  . Hypertension Neg Hx   . Coronary artery disease Neg Hx   . Prostate cancer Neg Hx      Social History   Social History  . Marital status: Married    Spouse name: N/A  . Number of children: 2  . Years of education: N/A   Occupational History  . [employer]     Disabled   Social History Main Topics  . Smoking status: Never Smoker  . Smokeless tobacco: Never Used  . Alcohol use No  . Drug use: No  . Sexual activity: Yes   Other Topics Concern  . Not on file   Social History Narrative   2 step children from wife's 2nd marriage.      Has living will   Wife is health care POA   Would accept resuscitation attempts   Not sure about tube feedings     BP (!) 148/68   Pulse 70   Ht 6\' 4"  (1.93 m)   Wt (!) 472 lb 12.8 oz (214.5 kg)   SpO2 95%   BMI 57.55 kg/m   Physical Exam:  Well appearing morbidly obese, middle-aged man, NAD HEENT: Unremarkable Neck:   7 cm JVD, no thyromegally Lungs:  Clear with no wheezes, rales, or rhonchi. HEART:  Regular rate rhythm, no murmurs, no rubs, no clicks, distant heart sounds. Abd:  soft, obese, positive bowel sounds, no organomegally, no rebound, no guarding Ext:  2 plus pulses, 1+ peripheral edema, no cyanosis, no clubbing Skin:  No rashes no nodules Neuro:  CN II through XII intact, motor grossly intact  DEVICE  Normal device function.  See PaceArt for details.   Assess/Plan:  1. VF arrest - he is maintaining NSR 2. Atrial fib - he is maintaining NSR on amio 3. coags - he would like to stop warfarin and start Xarelto 4. ICD - his Medtronic device is working normally. Will recheck in several months. He has a 6949 lead.  [name].D.

## 2016-04-11 ENCOUNTER — Encounter: Payer: Self-pay | Admitting: Internal Medicine

## 2016-05-04 ENCOUNTER — Encounter: Payer: Medicare HMO | Attending: Surgery | Admitting: Registered"

## 2016-05-04 DIAGNOSIS — E785 Hyperlipidemia, unspecified: Secondary | ICD-10-CM | POA: Insufficient documentation

## 2016-05-04 DIAGNOSIS — Z713 Dietary counseling and surveillance: Secondary | ICD-10-CM | POA: Diagnosis not present

## 2016-05-04 DIAGNOSIS — I5032 Chronic diastolic (congestive) heart failure: Secondary | ICD-10-CM | POA: Diagnosis not present

## 2016-05-04 DIAGNOSIS — Z6841 Body Mass Index (BMI) 40.0 and over, adult: Secondary | ICD-10-CM | POA: Insufficient documentation

## 2016-05-04 DIAGNOSIS — E669 Obesity, unspecified: Secondary | ICD-10-CM

## 2016-05-04 DIAGNOSIS — I110 Hypertensive heart disease with heart failure: Secondary | ICD-10-CM | POA: Diagnosis not present

## 2016-05-04 DIAGNOSIS — E1140 Type 2 diabetes mellitus with diabetic neuropathy, unspecified: Secondary | ICD-10-CM | POA: Diagnosis not present

## 2016-05-04 DIAGNOSIS — G4730 Sleep apnea, unspecified: Secondary | ICD-10-CM | POA: Insufficient documentation

## 2016-05-05 DIAGNOSIS — G4733 Obstructive sleep apnea (adult) (pediatric): Secondary | ICD-10-CM | POA: Diagnosis not present

## 2016-05-05 NOTE — Progress Notes (Signed)
Supervised Weight Loss Class:  Appt start time: 1600   End time:  1630.  Patient was seen on 05/04/2016 for the "Vitamins" Supervised Weight Loss Class at the [facility].   Surgery type: RYGB Start weight at [facility]: 467  Weight today: 465.3 Weight change: 1.7 lb loss  The following learning objectives were met by the patient during this class:  Objectives: - Review vitamin and Calcium recommendations based on procedure - Identify appropriate types/brands of vitamins and Calcium - Reinforce the need for chewable or liquid supplements  Goals: - Find appropriate supplements that meet taste and budget needs - Begin taking a recommended multivitamin and Calcium supplement - Follow physical activity recommendations stated - Follow diet recommendations listed below   Energy and Macronutrient Recomendations: Calories: 1800 Carbohydrate: 200 Protein: 135 Fat: 50  Handouts given: Vitamins Brochure

## 2016-05-16 ENCOUNTER — Ambulatory Visit (INDEPENDENT_AMBULATORY_CARE_PROVIDER_SITE_OTHER): Payer: Medicare HMO | Admitting: Psychiatry

## 2016-05-16 DIAGNOSIS — F509 Eating disorder, unspecified: Secondary | ICD-10-CM

## 2016-05-19 ENCOUNTER — Other Ambulatory Visit: Payer: Self-pay | Admitting: Internal Medicine

## 2016-05-29 ENCOUNTER — Encounter: Payer: Medicare HMO | Attending: Surgery | Admitting: Registered"

## 2016-05-29 ENCOUNTER — Encounter: Payer: Self-pay | Admitting: Registered"

## 2016-05-29 DIAGNOSIS — E1140 Type 2 diabetes mellitus with diabetic neuropathy, unspecified: Secondary | ICD-10-CM | POA: Diagnosis not present

## 2016-05-29 DIAGNOSIS — I110 Hypertensive heart disease with heart failure: Secondary | ICD-10-CM | POA: Diagnosis not present

## 2016-05-29 DIAGNOSIS — Z6841 Body Mass Index (BMI) 40.0 and over, adult: Secondary | ICD-10-CM | POA: Insufficient documentation

## 2016-05-29 DIAGNOSIS — I5032 Chronic diastolic (congestive) heart failure: Secondary | ICD-10-CM | POA: Diagnosis not present

## 2016-05-29 DIAGNOSIS — E785 Hyperlipidemia, unspecified: Secondary | ICD-10-CM | POA: Diagnosis not present

## 2016-05-29 DIAGNOSIS — Z713 Dietary counseling and surveillance: Secondary | ICD-10-CM | POA: Diagnosis not present

## 2016-05-29 DIAGNOSIS — G4730 Sleep apnea, unspecified: Secondary | ICD-10-CM | POA: Insufficient documentation

## 2016-05-29 DIAGNOSIS — E669 Obesity, unspecified: Secondary | ICD-10-CM

## 2016-05-29 NOTE — Progress Notes (Signed)
Appt start time: 2:05 end time:  2:20 Assessment: 2nd SWL Appointment.   Start Wt at [facility]: 467.0 Wt: 470.6 BMI: 57.28   Pt arrives having gained 5.3 lbs from previous visit. Pt states she checks BS 2x/day: FBS (143) and before dinner (170). Pt reports he has decreased soda intake from 2 cans to 1 can per day. Pt states he and wife have moved into a new house about a month ago, causing knee problems to flare up, limiting his physical activity. Pt states his knees are improving. Pt reports he has a new Fitbit and monitors his steps, averaging 3000 steps/day.    Preferred Learning Style:   Auditory  Visual  Hands on  No preference indicated   Learning Readiness:   Ready  Change in progress  MEDICATIONS: See list   DIETARY INTAKE:  24-hr recall:  B ( AM): 2 eggs  Snk ( AM): none  L ( PM): [employer] - cheeseburger, soda Snk ( PM): grapes D ( PM): 2 slices of pizza Snk ( PM): apple Beverages: milk, diet coke, water  Usual physical activity: arm exercises, walking about 3000 steps/day   Diet to Follow: Calories: 1800 Carbohydrate: 200 Protein: 135 Fat: 50  Nutritional Diagnosis:  [place]-3.3 Overweight/obesity related to past poor dietary habits and physical inactivity as evidenced by patient w/ upcoming RYGB surgery following dietary guidelines for continued weight loss.    Intervention:  Nutrition counseling for upcoming Bariatric Surgery.  Goals: - Add in protein source with carb source. - Continue to work chewing food well to applesauce consistency.  - Find a variety of liquid protein source options.  Teaching Method Utilized:  Visual Auditory Hands on  Handouts given during visit include:  Protein Shakes  Barriers to learning/adherence to lifestyle change: none  Demonstrated degree of understanding via:  Teach Back   Monitoring/Evaluation:  Dietary intake, exercise, and body weight in 1 month(s).

## 2016-05-29 NOTE — Patient Instructions (Addendum)
-   Add in protein source with carb source.  - Continue to work chewing food well to applesauce consistency.   - Find a variety of liquid protein source options.

## 2016-06-01 ENCOUNTER — Telehealth: Payer: Self-pay | Admitting: *Deleted

## 2016-06-01 NOTE — Telephone Encounter (Signed)
Patient's wife left a voicemail stating that he has an appointment scheduled with you tomorrow. Patient's wife stated that she is concerned about another issue with her husband and it is dental hygiene.  Helene Kelp stated that patient has not been for a cleaning in over 3 years and and won't go anymore and he has coverage. Patient's wife stated that she is concerned because of his health problems and concerned with infections. Helene Kelp stated that she hopes that Dr. [name] could bring the subject up somehow without saying that she called with these concerns, but understands if he can't.

## 2016-06-02 ENCOUNTER — Ambulatory Visit (INDEPENDENT_AMBULATORY_CARE_PROVIDER_SITE_OTHER): Payer: Medicare HMO | Admitting: Internal Medicine

## 2016-06-02 ENCOUNTER — Encounter: Payer: Self-pay | Admitting: Internal Medicine

## 2016-06-02 VITALS — BP 138/70 | HR 73 | Temp 98.1°F | Wt >= 6400 oz

## 2016-06-02 DIAGNOSIS — R21 Rash and other nonspecific skin eruption: Secondary | ICD-10-CM | POA: Diagnosis not present

## 2016-06-02 MED ORDER — TRIAMCINOLONE ACETONIDE 0.1 % EX CREA: 1.0000 "application " | TOPICAL_CREAM | Freq: Two times a day (BID) | CUTANEOUS | 1 refills | Status: DC | PRN

## 2016-06-02 NOTE — Assessment & Plan Note (Signed)
Looks like either a urticarial variant or some type of irritant rash Clearly not shingles as he was concerned about ?reaction to something he ate Okay to use benedryl prn TAC for itching

## 2016-06-02 NOTE — Telephone Encounter (Signed)
I will try to bring this up at the visit

## 2016-06-02 NOTE — Progress Notes (Signed)
Subjective:    Patient ID: Craig Lindsey, male    DOB: 07-Nov-1960, 56 y.o.   MRN: [id number]  HPI Here due to rash  Rash along both flanks in mid abdomen Started a week ago while at church-- noticed fever and shakes Finally improved the next day--but then the rash became more evident Tried cortisone cream (OTC)--not much help Then tried benedryl and that helped-- 2 days ago Still mild persistence Fever has not returned  No change in detergent, etc Did eat at the church function  Current Outpatient Prescriptions on File Prior to Visit  Medication Sig Dispense Refill  . ACCU-CHEK FASTCLIX LANCETS MISC Use to check blood sugar three times a day.  Dx: E11.40 306 each 3  . amiodarone (PACERONE) 200 MG tablet Take 1 tablet (200 mg total) by mouth daily. 90 tablet 3  . atorvastatin (LIPITOR) 10 MG tablet Take 10 mg by mouth daily at 6 PM.    . BD ULTRA-FINE PEN NEEDLES 29G X 12.7MM MISC USE  TO INJECT TWICE DAILY  TO THREE TIMES DAILY 270 each 3  . DILT-XR 240 MG 24 hr capsule TAKE 1 CAPSULE EVERY MORNING. 90 capsule 2  . furosemide (LASIX) 40 MG tablet Take one tablet by mouth daily and as needed 135 tablet 3  . glipiZIDE (GLUCOTROL XL) 10 MG 24 hr tablet Take 1 tablet (10 mg total) by mouth daily with breakfast. 90 tablet 3  . glucose blood (ACCU-CHEK SMARTVIEW) test strip Use as instructed to test blood sugar three times daily E11.21 100 each 12  . lisinopril (PRINIVIL,ZESTRIL) 40 MG tablet Take 1 tablet (40 mg total) by mouth daily. 90 tablet 3  . metoprolol succinate (TOPROL-XL) 100 MG 24 hr tablet TAKE 2 TABLETS BY MOUTH DAILY. TAKE WITH OR IMMEDIATELY FOLLOWING A MEAL.    . multivitamin (THERAGRAN) per tablet Take 1 tablet by mouth daily.      . NON FORMULARY Knee high compression hose 20-30 mmhg. Apply daily and remove at bedtime.     Marland Kitchen NOVOLOG MIX 70/30 FLEXPEN (70-30) 100 UNIT/ML FlexPen USE SLIDING SCALE BETWEEN 50 TO 70 UNITS TWICE DAILY (WITH BREAKFAST AND SUPPER). MAX  DAILY DOSE 150 UNITS 45 mL 5  . rivaroxaban (XARELTO) 20 MG TABS tablet TAKE 1 TABLET BY MOUTH DAILY WITH SUPPER     No current facility-administered medications on file prior to visit.     No Known Allergies  Past Medical History:  Diagnosis Date  . Arrhythmia 12/04   1. Ventricular tachycardia; Dr. [name]  2. Atrial fibrillation  . Chronic kidney disease    Complex L renal cyst, no tx for   . Diabetes mellitus    Type II  . DVT (deep venous thrombosis) ([place]) 7/09   RLE, Tx Arixtra  . Hemorrhoids    hx of  . Hyperlipidemia   . Hypertension   . Neuropathy    Oxaliplatin  . Obesity   . Personal history of colon cancer, stage III 1/09   Dr. Carlean Purl, Benay Spice, D. Newman  . Sigmoid colon ulcer 5 yrs ago   Sigmoid; Nydia Bouton, MD (colon cancer)  . Sleep apnea    uses cpap setting of 10  . Umbilical hernia     Past Surgical History:  Procedure Laterality Date  . CARDIAC DEFIBRILLATOR PLACEMENT    . CHOLECYSTECTOMY    . COLECTOMY  1/09   Sigmoid; Nydia Bouton, MD (colon cancer)  . COLONOSCOPY    . COLONOSCOPY N/A 02/25/2013  Procedure: COLONOSCOPY;  Surgeon: Gatha Mayer, MD;  Location: [facility];  Service: Endoscopy;  Laterality: N/A;  . HERNIA REPAIR  yrs ago   umb hernia    Family History  Problem Relation Age of Onset  . Diabetes Father   . Cancer Father        myelofibrosis  . Colon cancer Other   . Diabetes Maternal Grandmother   . Cancer Maternal Aunt        colon  . Hypertension Neg Hx   . Coronary artery disease Neg Hx   . Prostate cancer Neg Hx     Social History   Social History  . Marital status: Married    Spouse name: N/A  . Number of children: 2  . Years of education: N/A   Occupational History  . [employer]     Disabled   Social History Main Topics  . Smoking status: Never Smoker  . Smokeless tobacco: Never Used  . Alcohol use No  . Drug use: No  . Sexual activity: Yes   Other Topics Concern  . Not on file   Social  History Narrative   2 step children from wife's 2nd marriage.      Has living will   Wife is health care POA   Would accept resuscitation attempts   Not sure about tube feedings   Review of Systems No cough or SOB No N/V No diarrhea No teeth problems or dental infections Going through the evaluation for the bariatric surgery    Objective:   Physical Exam  Skin:  Serpiginous raised areas along the outside of the abdominal pannus on both sides Mild redness but not hot, tender or infected          Assessment & Plan:

## 2016-06-04 DIAGNOSIS — G4733 Obstructive sleep apnea (adult) (pediatric): Secondary | ICD-10-CM | POA: Diagnosis not present

## 2016-06-20 DIAGNOSIS — G4733 Obstructive sleep apnea (adult) (pediatric): Secondary | ICD-10-CM | POA: Diagnosis not present

## 2016-06-26 ENCOUNTER — Encounter: Payer: Medicare HMO | Attending: Surgery | Admitting: Registered"

## 2016-06-26 ENCOUNTER — Encounter: Payer: Self-pay | Admitting: Registered"

## 2016-06-26 DIAGNOSIS — I110 Hypertensive heart disease with heart failure: Secondary | ICD-10-CM | POA: Diagnosis not present

## 2016-06-26 DIAGNOSIS — Z6841 Body Mass Index (BMI) 40.0 and over, adult: Secondary | ICD-10-CM | POA: Insufficient documentation

## 2016-06-26 DIAGNOSIS — I5032 Chronic diastolic (congestive) heart failure: Secondary | ICD-10-CM | POA: Insufficient documentation

## 2016-06-26 DIAGNOSIS — Z713 Dietary counseling and surveillance: Secondary | ICD-10-CM | POA: Insufficient documentation

## 2016-06-26 DIAGNOSIS — E785 Hyperlipidemia, unspecified: Secondary | ICD-10-CM | POA: Diagnosis not present

## 2016-06-26 DIAGNOSIS — G4730 Sleep apnea, unspecified: Secondary | ICD-10-CM | POA: Diagnosis not present

## 2016-06-26 DIAGNOSIS — E1140 Type 2 diabetes mellitus with diabetic neuropathy, unspecified: Secondary | ICD-10-CM | POA: Insufficient documentation

## 2016-06-26 DIAGNOSIS — E119 Type 2 diabetes mellitus without complications: Secondary | ICD-10-CM

## 2016-06-26 NOTE — Patient Instructions (Addendum)
-   Continue to pair protein sources with carbohydrate sources such as apple with peanut butter, grapes with cheese stick.   - Aim to not drink 15 min before eating, not drinking while eating, and waiting 30 min after eating meal/snack.   - Keep up the great work with making changes!

## 2016-06-26 NOTE — Progress Notes (Signed)
Appt start time: 2:02 end time: 2:20 Assessment: 3rd SWL Appointment.   Start Wt at [facility]: 467.0 Wt: 470.6, 464.2 BMI: 57.28   Pt arrives with 6.4 lbs loss from previous visit. Pt states he has tried and likes a variety (caramel, banana, strawberry) of Premier protein shakes. Pt states he checks his BS 2x/day: FBS (90-95) and before dinner (200). Pt states he does all the cooking at home. Pt states he has reduced his fast food intake. Pt states he has an umbilical hernia and curious about how this impacts surgery.    Preferred Learning Style:   No preference indicated   Learning Readiness:   Ready  Change in progress  MEDICATIONS: See list   DIETARY INTAKE:  24-hr recall:  B ( AM): 2 eggs, 2 pcs [place] bacon, wheat toast  Snk ( AM): cheese stick  L ( PM): [place] or ham sandwich with mustard cheese or protein shake Snk ( PM): grapes D ( PM): roast, carrots and potatoes or protein shake Snk ( PM): apple, almonds Beverages: milk, diet coke, water  Usual physical activity: arm exercises, walking about 3000 steps/day, resistance bands   Diet to Follow: Calories: 1800 Carbohydrate: 200 Protein: 135 Fat: 50  Nutritional Diagnosis:  [place]-3.3 Overweight/obesity related to past poor dietary habits and physical inactivity as evidenced by patient w/ upcoming RYGB surgery following dietary guidelines for continued weight loss.    Intervention:  Nutrition counseling for upcoming Bariatric Surgery.  Goals: - Continue to pair protein sources with carbohydrate sources such as apple with peanut butter, grapes with cheese stick.  - Aim to not drink 15 min before eating, not drinking while eating, and waiting 30 min after eating meal/snack.  - Keep up the great work with making changes!  Teaching Method Utilized:  Visual Auditory  Handouts given during visit include:  none  Barriers to learning/adherence to lifestyle change: none  Demonstrated degree of understanding via:   Teach Back   Monitoring/Evaluation:  Dietary intake, exercise, and body weight in 1 month(s).

## 2016-07-04 ENCOUNTER — Telehealth: Payer: Self-pay | Admitting: Cardiology

## 2016-07-04 ENCOUNTER — Encounter: Payer: Medicare HMO | Admitting: *Deleted

## 2016-07-04 NOTE — Telephone Encounter (Signed)
Spoke with pt and reminded pt of remote transmission that is due today. Pt verbalized understanding.   

## 2016-07-05 DIAGNOSIS — G4733 Obstructive sleep apnea (adult) (pediatric): Secondary | ICD-10-CM | POA: Diagnosis not present

## 2016-07-11 ENCOUNTER — Ambulatory Visit (INDEPENDENT_AMBULATORY_CARE_PROVIDER_SITE_OTHER): Payer: Medicare HMO | Admitting: *Deleted

## 2016-07-11 DIAGNOSIS — I472 Ventricular tachycardia, unspecified: Secondary | ICD-10-CM

## 2016-07-12 ENCOUNTER — Encounter: Payer: Self-pay | Admitting: Cardiology

## 2016-07-12 LAB — CUP PACEART REMOTE DEVICE CHECK
Battery Voltage: 2.7 V
Brady Statistic RV Percent Paced: 0.88 %
Date Time Interrogation Session: 20180626180549
HighPow Impedance: 418 Ohm
HighPow Impedance: 53 Ohm
HighPow Impedance: 69 Ohm
Implantable Lead Location: 753860
Implantable Lead Model: 6949
Implantable Pulse Generator Implant Date: 20110706
Lead Channel Impedance Value: 513 Ohm
Lead Channel Setting Pacing Amplitude: 2.5 V
Lead Channel Setting Pacing Pulse Width: 0.4 ms
Lead Channel Setting Sensing Sensitivity: 0.3 mV
MDC IDC LEAD IMPLANT DT: 20050113
MDC IDC MSMT LEADCHNL RV PACING THRESHOLD AMPLITUDE: 1 V
MDC IDC MSMT LEADCHNL RV PACING THRESHOLD PULSEWIDTH: 0.4 ms
MDC IDC MSMT LEADCHNL RV SENSING INTR AMPL: 11.375 mV
MDC IDC MSMT LEADCHNL RV SENSING INTR AMPL: 11.375 mV

## 2016-07-12 NOTE — Progress Notes (Signed)
Remote ICD transmission.   

## 2016-07-24 ENCOUNTER — Encounter: Payer: Self-pay | Admitting: Skilled Nursing Facility1

## 2016-07-24 ENCOUNTER — Encounter: Payer: Medicare HMO | Attending: Surgery | Admitting: Skilled Nursing Facility1

## 2016-07-24 DIAGNOSIS — G4730 Sleep apnea, unspecified: Secondary | ICD-10-CM | POA: Diagnosis not present

## 2016-07-24 DIAGNOSIS — Z6841 Body Mass Index (BMI) 40.0 and over, adult: Secondary | ICD-10-CM | POA: Diagnosis not present

## 2016-07-24 DIAGNOSIS — E1140 Type 2 diabetes mellitus with diabetic neuropathy, unspecified: Secondary | ICD-10-CM | POA: Diagnosis not present

## 2016-07-24 DIAGNOSIS — E785 Hyperlipidemia, unspecified: Secondary | ICD-10-CM | POA: Diagnosis not present

## 2016-07-24 DIAGNOSIS — I5032 Chronic diastolic (congestive) heart failure: Secondary | ICD-10-CM | POA: Insufficient documentation

## 2016-07-24 DIAGNOSIS — Z713 Dietary counseling and surveillance: Secondary | ICD-10-CM | POA: Insufficient documentation

## 2016-07-24 DIAGNOSIS — I110 Hypertensive heart disease with heart failure: Secondary | ICD-10-CM | POA: Diagnosis not present

## 2016-07-24 DIAGNOSIS — N183 Chronic kidney disease, stage 3 unspecified: Secondary | ICD-10-CM

## 2016-07-24 NOTE — Progress Notes (Signed)
Done in error.

## 2016-07-24 NOTE — Progress Notes (Signed)
Appt start time: 2:02 end time: 2:20 Assessment: 4th SWL Appointment.   Start Wt at [facility]: 467.0 Wt: 466 BMI: 56.83   Pt arrives with 2 lbs gainedfrom previous visit. Pt states he has tried and likes a variety (caramel, banana, strawberry) of Premier protein shakes. Pt states he checks his BS 2x/day: FBS (142) and before dinner (200). Pt states he does all the cooking at home.  Pt states he has been Chewing more and not drinking with meals, trying to eat better, pt states he only drinks one diet soda a day. Pt states he Takes his water pill every day but he has not taken it yet today because he knew he was leaving the house.   Preferred Learning Style:   No preference indicated   Learning Readiness:   Ready  Change in progress  MEDICATIONS: See list   DIETARY INTAKE:  24-hr recall:  B ( AM): cereal with fruit Snk ( AM): cheese stick and peanutbutter crackers L ( PM): [place] or ham sandwich with mustard cheese or protein shake Snk ( PM): grapes D ( PM): roast, carrots and potatoes or protein shake----chicken quesadilla---hotdogs----pasta-----salad Snk ( PM): apple, almonds Beverages: 2% milk, diet coke, water  Usual physical activity: arm exercises, walking about 3000 steps/day, resistance bands   Diet to Follow: Calories: 1800 Carbohydrate: 200 Protein: 135 Fat: 50  Nutritional Diagnosis:  [place]-3.3 Overweight/obesity related to past poor dietary habits and physical inactivity as evidenced by patient w/ upcoming RYGB surgery following dietary guidelines for continued weight loss.    Intervention:  Nutrition counseling for upcoming Bariatric Surgery.  Goals: -Work on mindful eating and understanding appetite verses hunger and satisfied verses full  Teaching Method Utilized:  Visual Auditory  Handouts given during visit include:  none  Barriers to learning/adherence to lifestyle change: none  Demonstrated degree of understanding via:  Teach Back    Monitoring/Evaluation:  Dietary intake, exercise, and body weight in 1 month(s).

## 2016-08-04 DIAGNOSIS — G4733 Obstructive sleep apnea (adult) (pediatric): Secondary | ICD-10-CM | POA: Diagnosis not present

## 2016-08-21 ENCOUNTER — Encounter: Payer: Self-pay | Admitting: Internal Medicine

## 2016-08-21 ENCOUNTER — Ambulatory Visit (INDEPENDENT_AMBULATORY_CARE_PROVIDER_SITE_OTHER): Payer: Medicare HMO | Admitting: Internal Medicine

## 2016-08-21 VITALS — BP 132/70 | HR 66 | Temp 98.0°F | Wt >= 6400 oz

## 2016-08-21 DIAGNOSIS — IMO0002 Reserved for concepts with insufficient information to code with codable children: Secondary | ICD-10-CM

## 2016-08-21 DIAGNOSIS — Z6841 Body Mass Index (BMI) 40.0 and over, adult: Secondary | ICD-10-CM

## 2016-08-21 DIAGNOSIS — I5032 Chronic diastolic (congestive) heart failure: Secondary | ICD-10-CM

## 2016-08-21 DIAGNOSIS — I4891 Unspecified atrial fibrillation: Secondary | ICD-10-CM

## 2016-08-21 DIAGNOSIS — E1165 Type 2 diabetes mellitus with hyperglycemia: Secondary | ICD-10-CM

## 2016-08-21 DIAGNOSIS — E1140 Type 2 diabetes mellitus with diabetic neuropathy, unspecified: Secondary | ICD-10-CM

## 2016-08-21 MED ORDER — METOPROLOL SUCCINATE ER 100 MG PO TB24: 200.0000 mg | ORAL_TABLET | Freq: Every day | ORAL | 3 refills | Status: DC

## 2016-08-21 MED ORDER — INSULIN ASPART PROT & ASPART (70-30 MIX) 100 UNIT/ML PEN: PEN_INJECTOR | SUBCUTANEOUS | 5 refills | Status: DC

## 2016-08-21 NOTE — Progress Notes (Signed)
Subjective:    Patient ID: Craig Lindsey, male    DOB: [birth year], 56 y.o.   MRN: [id number]  HPI Here for follow up of diabetes and other chronic health conditions Hopes to have surgery coming up Has 5th visit to dietician coming up Has seen the psychologist Weight is stable now Plans to do the stapling procedure  Checks sugars bid still AM 130-150  Dinner 150-170 Added 2 units of insulin Same mild numbness in feet  No chest pain No palpitations  No edema--with support socks No dizziness or syncope  Current Outpatient Prescriptions on File Prior to Visit  Medication Sig Dispense Refill  . ACCU-CHEK FASTCLIX LANCETS MISC Use to check blood sugar three times a day.  Dx: E11.40 306 each 3  . amiodarone (PACERONE) 200 MG tablet Take 1 tablet (200 mg total) by mouth daily. 90 tablet 3  . atorvastatin (LIPITOR) 10 MG tablet Take 10 mg by mouth daily at 6 PM.    . BD ULTRA-FINE PEN NEEDLES 29G X 12.7MM MISC USE  TO INJECT TWICE DAILY  TO THREE TIMES DAILY 270 each 3  . DILT-XR 240 MG 24 hr capsule TAKE 1 CAPSULE EVERY MORNING. 90 capsule 2  . furosemide (LASIX) 40 MG tablet Take one tablet by mouth daily and as needed 135 tablet 3  . glipiZIDE (GLUCOTROL XL) 10 MG 24 hr tablet Take 1 tablet (10 mg total) by mouth daily with breakfast. 90 tablet 3  . glucose blood (ACCU-CHEK SMARTVIEW) test strip Use as instructed to test blood sugar three times daily E11.21 100 each 12  . lisinopril (PRINIVIL,ZESTRIL) 40 MG tablet Take 1 tablet (40 mg total) by mouth daily. 90 tablet 3  . metoprolol succinate (TOPROL-XL) 100 MG 24 hr tablet TAKE 2 TABLETS BY MOUTH DAILY. TAKE WITH OR IMMEDIATELY FOLLOWING A MEAL.    . multivitamin (THERAGRAN) per tablet Take 1 tablet by mouth daily.      . NON FORMULARY Knee high compression hose 20-30 mmhg. Apply daily and remove at bedtime.     Marland Kitchen NOVOLOG MIX 70/30 FLEXPEN (70-30) 100 UNIT/ML FlexPen USE SLIDING SCALE BETWEEN 50 TO 70 UNITS TWICE DAILY (WITH  BREAKFAST AND SUPPER). MAX DAILY DOSE 150 UNITS 45 mL 5  . rivaroxaban (XARELTO) 20 MG TABS tablet TAKE 1 TABLET BY MOUTH DAILY WITH SUPPER     No current facility-administered medications on file prior to visit.     No Known Allergies  Past Medical History:  Diagnosis Date  . Arrhythmia 12/04   1. Ventricular tachycardia; Dr. [name]  2. Atrial fibrillation  . Chronic kidney disease    Complex L renal cyst, no tx for   . Diabetes mellitus    Type II  . DVT (deep venous thrombosis) ([place]) 7/09   RLE, Tx Arixtra  . Hemorrhoids    hx of  . Hyperlipidemia   . Hypertension   . Neuropathy    Oxaliplatin  . Obesity   . Personal history of colon cancer, stage III 1/09   Dr. Carlean Purl, Benay Spice, D. [place]  . Sigmoid colon ulcer 5 yrs ago   Sigmoid; Nydia Bouton, MD (colon cancer)  . Sleep apnea    uses cpap setting of 10  . Umbilical hernia     Past Surgical History:  Procedure Laterality Date  . CARDIAC DEFIBRILLATOR PLACEMENT    . CHOLECYSTECTOMY    . COLECTOMY  1/09   Sigmoid; Nydia Bouton, MD (colon cancer)  . COLONOSCOPY    .  COLONOSCOPY N/A 02/25/2013   Procedure: COLONOSCOPY;  Surgeon: Gatha Mayer, MD;  Location: [facility];  Service: Endoscopy;  Laterality: N/A;  . HERNIA REPAIR  yrs ago   umb hernia    Family History  Problem Relation Age of Onset  . Diabetes Father   . Cancer Father        myelofibrosis  . Colon cancer Other   . Diabetes Maternal Grandmother   . Cancer Maternal Aunt        colon  . Hypertension Neg Hx   . Coronary artery disease Neg Hx   . Prostate cancer Neg Hx     Social History   Social History  . Marital status: Married    Spouse name: N/A  . Number of children: 2  . Years of education: N/A   Occupational History  . [employer]     Disabled   Social History Main Topics  . Smoking status: Never Smoker  . Smokeless tobacco: Never Used  . Alcohol use No  . Drug use: No  . Sexual activity: Yes   Other Topics Concern  .  Not on file   Social History Narrative   2 step children from wife's 2nd marriage.      Has living will   Wife is health care POA   Would accept resuscitation attempts   Not sure about tube feedings   Review of Systems Rash is gone--benedryl and cream helped Sleeps okay    Objective:   Physical Exam  Constitutional: He appears well-nourished. No distress.  Cardiovascular: Normal rate, regular rhythm, normal heart sounds and intact distal pulses.  Exam reveals no gallop.   No murmur heard. Pulmonary/Chest: Effort normal and breath sounds normal. No respiratory distress. He has no wheezes. He has no rales.  Musculoskeletal: He exhibits no edema.  Skin:  No foot lesions  Psychiatric: He has a normal mood and affect. His behavior is normal.          Assessment & Plan:

## 2016-08-21 NOTE — Assessment & Plan Note (Signed)
Paced On xarelto

## 2016-08-21 NOTE — Assessment & Plan Note (Signed)
Still working through process for bariatric surgery

## 2016-08-21 NOTE — Assessment & Plan Note (Signed)
No exacerbation ?

## 2016-08-21 NOTE — Assessment & Plan Note (Signed)
A1c over 8% last time If still up over 8%, will add 5-10 units of insulin (mostly with AM dosing)

## 2016-08-22 ENCOUNTER — Encounter: Payer: Medicare HMO | Attending: Surgery | Admitting: Skilled Nursing Facility1

## 2016-08-22 ENCOUNTER — Encounter: Payer: Self-pay | Admitting: Skilled Nursing Facility1

## 2016-08-22 DIAGNOSIS — G4730 Sleep apnea, unspecified: Secondary | ICD-10-CM | POA: Insufficient documentation

## 2016-08-22 DIAGNOSIS — I5032 Chronic diastolic (congestive) heart failure: Secondary | ICD-10-CM | POA: Diagnosis not present

## 2016-08-22 DIAGNOSIS — Z6841 Body Mass Index (BMI) 40.0 and over, adult: Secondary | ICD-10-CM | POA: Diagnosis not present

## 2016-08-22 DIAGNOSIS — E785 Hyperlipidemia, unspecified: Secondary | ICD-10-CM | POA: Diagnosis not present

## 2016-08-22 DIAGNOSIS — Z713 Dietary counseling and surveillance: Secondary | ICD-10-CM | POA: Insufficient documentation

## 2016-08-22 DIAGNOSIS — I110 Hypertensive heart disease with heart failure: Secondary | ICD-10-CM | POA: Insufficient documentation

## 2016-08-22 DIAGNOSIS — E1140 Type 2 diabetes mellitus with diabetic neuropathy, unspecified: Secondary | ICD-10-CM | POA: Insufficient documentation

## 2016-08-22 DIAGNOSIS — E119 Type 2 diabetes mellitus without complications: Secondary | ICD-10-CM

## 2016-08-22 LAB — HEMOGLOBIN A1C
HEMOGLOBIN A1C: 6.9 % — AB (ref <5.7)
MEAN PLASMA GLUCOSE: 151 mg/dL

## 2016-08-22 NOTE — Progress Notes (Signed)
   Appt start time: 2:02 end time: 2:20 Assessment: 5th SWL Appointment.   Start Wt at [facility]: 467.0 Wt: 467 BMI: 63.34   Pt states he has tried and likes a variety (caramel, banana, strawberry) of Premier protein shakes. Pt states he checks his BS 2x/day: FBS (142) and before dinner (200). Pt states he does all the cooking at home.  Pt states he has been Chewing more and not drinking with meals, trying to eat better, pt states he only drinks one diet soda a day. Pt states he Takes his water pill every day but he has not taken it yet today because he knew he was leaving the house.   Pt arrives having gained about 1 pound. Pt states his A1C is 6.9. Pt states he has been working on mindful eating and feels ha did a good job with that. Pt states he is looking forward to surgery.    Preferred Learning Style:   No preference indicated   Learning Readiness:   Ready  Change in progress  MEDICATIONS: See list   DIETARY INTAKE:  24-hr recall:  B ( AM): eggs, toast, and milk or bran cereal Snk ( AM): carrots or fruit and cheestick L ( PM): [place] or ham sandwich with mustard cheese or protein shake or tomato sandwich Snk ( PM): grapes D ( PM): roast, carrots and potatoes or protein shake----chicken quesadilla---hotdogs----pasta-----salad Snk ( PM): apple, almonds Beverages: 2% milk, diet coke, water  Usual physical activity: arm exercises, walking about 3000 steps/day, resistance bands   Diet to Follow: Calories: 1800 Carbohydrate: 200 Protein: 135 Fat: 50  Nutritional Diagnosis:  [place]-3.3 Overweight/obesity related to past poor dietary habits and physical inactivity as evidenced by patient w/ upcoming RYGB surgery following dietary guidelines for continued weight loss.    Intervention:  Nutrition counseling for upcoming Bariatric Surgery.  Goals: -Work on mindful eating and understanding appetite verses hunger and satisfied verses full -Keep working on not drinking with  meals -Work on the idea of portion sizes for after surgery and the work that will be needed to be successful   Teaching Method Utilized:  Ship broker  Handouts given during visit include:  none  Barriers to learning/adherence to lifestyle change: none  Demonstrated degree of understanding via:  Teach Back   Monitoring/Evaluation:  Dietary intake, exercise, and body weight in 1 month(s).

## 2016-08-24 ENCOUNTER — Other Ambulatory Visit: Payer: Self-pay | Admitting: Internal Medicine

## 2016-08-25 NOTE — Telephone Encounter (Signed)
Request received for Xarelto 20mg ; pt is 56 yrs old, wt-214.5kg, Cr-1.66 on 02/23/16, last seen by Dr. [name] on 04/04/16, CrCl-150.77ml/min; will send in refill request to requested pharmacy.

## 2016-09-04 DIAGNOSIS — G4733 Obstructive sleep apnea (adult) (pediatric): Secondary | ICD-10-CM | POA: Diagnosis not present

## 2016-09-20 ENCOUNTER — Encounter: Payer: Medicare HMO | Attending: Surgery | Admitting: Registered"

## 2016-09-20 ENCOUNTER — Encounter: Payer: Self-pay | Admitting: Registered"

## 2016-09-20 DIAGNOSIS — E1140 Type 2 diabetes mellitus with diabetic neuropathy, unspecified: Secondary | ICD-10-CM | POA: Diagnosis not present

## 2016-09-20 DIAGNOSIS — Z6841 Body Mass Index (BMI) 40.0 and over, adult: Secondary | ICD-10-CM | POA: Insufficient documentation

## 2016-09-20 DIAGNOSIS — E119 Type 2 diabetes mellitus without complications: Secondary | ICD-10-CM

## 2016-09-20 DIAGNOSIS — Z713 Dietary counseling and surveillance: Secondary | ICD-10-CM | POA: Diagnosis not present

## 2016-09-20 DIAGNOSIS — I5032 Chronic diastolic (congestive) heart failure: Secondary | ICD-10-CM | POA: Insufficient documentation

## 2016-09-20 DIAGNOSIS — G4730 Sleep apnea, unspecified: Secondary | ICD-10-CM | POA: Diagnosis not present

## 2016-09-20 DIAGNOSIS — I110 Hypertensive heart disease with heart failure: Secondary | ICD-10-CM | POA: Diagnosis not present

## 2016-09-20 DIAGNOSIS — E785 Hyperlipidemia, unspecified: Secondary | ICD-10-CM | POA: Diagnosis not present

## 2016-09-20 NOTE — Patient Instructions (Addendum)
-   Continue to aim to not drink 30 min after eating. Set timers on phone, wash dishes, etc to pass the time.  - Check out Baritastic app.   - Reduce carbonation intake.

## 2016-09-20 NOTE — Progress Notes (Addendum)
Appt start time: 2:00 end time: 2:24 Assessment: 6th SWL Appointment.   Start Wt at [facility]: 467.0 Wt: 464.2 BMI: 62.96   Pt arrives having lost about 3 pounds. Pt states he has been doing well with mindful eating. Pt states he checks his BS 2x/day: FBS (140-150) and before dinner (160-170). Pt states he is doing well with not drinking 15 min before eating and not drinking while eating, but still having a little trouble with waiting 30 min afterwards. Pt states he has added some leg exercises to his physical activity routine.   This is pt's last SWL visit with [place].    Preferred Learning Style:   No preference indicated   Learning Readiness:   Ready  Change in progress  MEDICATIONS: See list   DIETARY INTAKE:  24-hr recall:  B ( AM): eggs, toast, and milk or bran cereal Snk ( AM): carrots or fruit and cheestick L ( PM): [place] or ham sandwich with mustard cheese or protein shake or tomato sandwich Snk ( PM): grapes D ( PM): roast, carrots and potatoes or protein shake----chicken quesadilla---hotdogs----pasta-----salad Snk ( PM): apple, almonds Beverages: 2% milk, diet coke, water  Usual physical activity: arm exercises, walking about 3000 steps/day, resistance bands, leg exercises  Diet to Follow: Calories: 1800 Carbohydrate: 200 Protein: 135 Fat: 50  Nutritional Diagnosis:  [place]-3.3 Overweight/obesity related to past poor dietary habits and physical inactivity as evidenced by patient w/ upcoming RYGB surgery following dietary guidelines for continued weight loss.    Intervention:  Nutrition counseling for upcoming Bariatric Surgery.  Goals: - Continue to aim to not drink 30 min after eating. Set timers on phone, wash dishes, etc to pass the time. - Check out Baritastic app.  - Reduce carbonation intake.    Teaching Method Utilized:  Visual Auditory  Handouts given during visit include:  none  Barriers to learning/adherence to lifestyle change:  none  Demonstrated degree of understanding via:  Teach Back   Monitoring/Evaluation:  Dietary intake, exercise, and body weight prn.

## 2016-10-05 DIAGNOSIS — G4733 Obstructive sleep apnea (adult) (pediatric): Secondary | ICD-10-CM | POA: Diagnosis not present

## 2016-10-06 ENCOUNTER — Telehealth: Payer: Self-pay | Admitting: Registered"

## 2016-10-06 NOTE — Telephone Encounter (Signed)
Pt called and emailed wanting to make sure that I had communicated with [facility] about his completed supervised weight loss visits with our department. I confirmed with him an e-mail was sent from me to them on 09/20/2016 and I will send another e-mail per his request. Pt states it was told to him that  communication about his completed visits had not been received. I sent another e-mail on 10/06/2016. I also reminded the pt to be patient as the process moves forward with it being towards the last quarter of the year and things may be busy.

## 2016-10-10 ENCOUNTER — Telehealth: Payer: Self-pay | Admitting: Cardiology

## 2016-10-10 ENCOUNTER — Ambulatory Visit (INDEPENDENT_AMBULATORY_CARE_PROVIDER_SITE_OTHER): Payer: Medicare HMO | Admitting: *Deleted

## 2016-10-10 DIAGNOSIS — I472 Ventricular tachycardia, unspecified: Secondary | ICD-10-CM

## 2016-10-10 NOTE — Telephone Encounter (Signed)
Spoke with pt and reminded pt of remote transmission that is due today. Pt verbalized understanding.   

## 2016-10-11 NOTE — Progress Notes (Signed)
Remote ICD transmission.   

## 2016-10-12 ENCOUNTER — Encounter: Payer: Self-pay | Admitting: Cardiology

## 2016-10-12 LAB — CUP PACEART REMOTE DEVICE CHECK
Brady Statistic RV Percent Paced: 0.32 %
HIGH POWER IMPEDANCE MEASURED VALUE: 475 Ohm
HighPow Impedance: 57 Ohm
HighPow Impedance: 76 Ohm
Implantable Lead Location: 753860
Implantable Lead Model: 6949
Lead Channel Impedance Value: 551 Ohm
Lead Channel Pacing Threshold Amplitude: 0.875 V
Lead Channel Setting Sensing Sensitivity: 0.3 mV
MDC IDC LEAD IMPLANT DT: 20050113
MDC IDC MSMT BATTERY VOLTAGE: 2.64 V
MDC IDC MSMT LEADCHNL RV PACING THRESHOLD PULSEWIDTH: 0.4 ms
MDC IDC MSMT LEADCHNL RV SENSING INTR AMPL: 11.25 mV
MDC IDC MSMT LEADCHNL RV SENSING INTR AMPL: 11.25 mV
MDC IDC PG IMPLANT DT: 20110706
MDC IDC SESS DTM: 20180925194449
MDC IDC SET LEADCHNL RV PACING AMPLITUDE: 2.5 V
MDC IDC SET LEADCHNL RV PACING PULSEWIDTH: 0.4 ms

## 2016-10-23 ENCOUNTER — Encounter: Payer: Medicare HMO | Attending: Surgery | Admitting: Skilled Nursing Facility1

## 2016-10-23 DIAGNOSIS — G4730 Sleep apnea, unspecified: Secondary | ICD-10-CM | POA: Diagnosis not present

## 2016-10-23 DIAGNOSIS — E785 Hyperlipidemia, unspecified: Secondary | ICD-10-CM | POA: Insufficient documentation

## 2016-10-23 DIAGNOSIS — I5032 Chronic diastolic (congestive) heart failure: Secondary | ICD-10-CM | POA: Diagnosis not present

## 2016-10-23 DIAGNOSIS — I110 Hypertensive heart disease with heart failure: Secondary | ICD-10-CM | POA: Diagnosis not present

## 2016-10-23 DIAGNOSIS — Z713 Dietary counseling and surveillance: Secondary | ICD-10-CM | POA: Diagnosis not present

## 2016-10-23 DIAGNOSIS — N183 Chronic kidney disease, stage 3 unspecified: Secondary | ICD-10-CM

## 2016-10-23 DIAGNOSIS — Z6841 Body Mass Index (BMI) 40.0 and over, adult: Secondary | ICD-10-CM | POA: Insufficient documentation

## 2016-10-23 DIAGNOSIS — E1140 Type 2 diabetes mellitus with diabetic neuropathy, unspecified: Secondary | ICD-10-CM | POA: Diagnosis not present

## 2016-10-24 ENCOUNTER — Encounter: Payer: Self-pay | Admitting: Skilled Nursing Facility1

## 2016-10-24 NOTE — Progress Notes (Signed)
Pre-Operative Nutrition Class:  Appt start time: 4720   End time:  1830.  Patient was seen on 10/23/2016 for Pre-Operative Bariatric Surgery [facility].   Surgery date:  Surgery type: RYGB Start weight at [facility]: 467 Weight today: 474.3  Samples given per MNT protocol. Patient educated on appropriate usage: [employer] health Multivitamin Lot # [id number] Exp: 07/2018  Bariatric Advantage Calcium  Lot # [id number] Exp: nov-02-2016  Renee Pain Protein Shake Lot # [id number] Exp: Jun 11, 2017  The following the learning objectives were met by the patient during this course:  Identify Pre-Op Dietary Goals and will begin 2 weeks pre-operatively  Identify appropriate sources of fluids and proteins   State protein recommendations and appropriate sources pre and post-operatively  Identify Post-Operative Dietary Goals and will follow for 2 weeks post-operatively  Identify appropriate multivitamin and calcium sources  Describe the need for physical activity post-operatively and will follow MD recommendations  State when to call healthcare provider regarding medication questions or post-operative complications  Handouts given during class include:  Pre-Op Bariatric Surgery Diet Handout  Protein Shake Handout  Post-Op Bariatric Surgery Nutrition Handout  BELT Program Information Flyer  Support Group Information Flyer  [facility] Bariatric Supplements Price List  [facility]: [facility] advancement per MD.

## 2016-11-02 DIAGNOSIS — G4733 Obstructive sleep apnea (adult) (pediatric): Secondary | ICD-10-CM | POA: Diagnosis not present

## 2016-11-04 DIAGNOSIS — G4733 Obstructive sleep apnea (adult) (pediatric): Secondary | ICD-10-CM | POA: Diagnosis not present

## 2016-11-10 DIAGNOSIS — E1169 Type 2 diabetes mellitus with other specified complication: Secondary | ICD-10-CM | POA: Diagnosis not present

## 2016-11-10 DIAGNOSIS — E669 Obesity, unspecified: Secondary | ICD-10-CM | POA: Diagnosis not present

## 2016-11-10 DIAGNOSIS — Z7901 Long term (current) use of anticoagulants: Secondary | ICD-10-CM | POA: Diagnosis not present

## 2016-11-10 DIAGNOSIS — G4733 Obstructive sleep apnea (adult) (pediatric): Secondary | ICD-10-CM | POA: Diagnosis not present

## 2016-11-10 DIAGNOSIS — K432 Incisional hernia without obstruction or gangrene: Secondary | ICD-10-CM | POA: Diagnosis not present

## 2016-11-13 ENCOUNTER — Telehealth: Payer: Self-pay | Admitting: Internal Medicine

## 2016-11-13 NOTE — Telephone Encounter (Signed)
° °  [facility] Pre-operative Risk Assessment    Request for surgical clearance:  1. What type of surgery is being performed? LAPAROSCOPIC GASTRIC SLEEVE  2. When is this surgery scheduled? January 2019   3. Are there any medications that need to be held prior to surgery and how long? XARELTO   4. Practice name and name of physician performing surgery? [facility], DR. Shanon Brow [place]  5. What is your office phone and fax number? PH: [phone], FAX: [phone]   6. Anesthesia type (None, local, MAC, general) ? NOT LISTED    Craig Lindsey 11/13/2016, 2:47 PM  _________________________________________________________________   (provider comments below)

## 2016-11-14 NOTE — Telephone Encounter (Signed)
    Chart reviewed as part of pre-operative protocol coverage. Because of Craig Lindsey's past medical history and time since last visit, he/she will require a follow-up visit in order to better assess preoperative cardiovascular risk.   Please schedule appointment with Dr. [name]. Last seen 04/04/16. Surgery schedule in January 2019.  Pre-op covering staff: - Please schedule appointment and call patient to inform them. - Please contact requesting surgeon's office via preferred method (i.e, phone, fax) to inform them of need for appointment prior to surgery.  [place], PA  11/14/2016, 2:55 PM

## 2016-11-15 NOTE — Telephone Encounter (Signed)
   Chart reviewed as part of pre-operative protocol coverage. Given past medical history and time since last visit, based on ACC/AHA guidelines, Craig Lindsey would be at acceptable risk for the planned procedure without further cardiovascular testing. Per Dr. [name] Pt is low risk.  Pt should hold Xarelto 2 days prior to procedure no need for lovenox bridging.  Pt should start Xarelto 1-2 days after procedure due to hx of unprovoked DVT in 2009.  This will be at discretion of procedure MD.   Cecilie Kicks, NP 11/15/2016, 4:40 PM

## 2016-11-15 NOTE — Telephone Encounter (Signed)
Patient with diagnosis of Atrial fibrillation on Xarelto for anticoagulation.    Procedure: Laparoscopic Gastric Sleeve Date of procedure: January/2019  CHADS2-VASc score of  4 ( HTN, DM2, DVT)  **Unprovoked DVT in 2009 (> 5 yrs ago; low risk)**  CrCl > 60 ml/min  Per office protocol, patient can hold Xarelto for 2 days prior to procedure.  Patient will not need bridging with Lovenox (enoxaparin) around procedure.  Patient should restart Xarelto or any other parenteral anticoagulation  1-2 days after procedure due to history of unprovoked DVT in 2009, at discretion of procedure MD.   Harrington Challenger PharmD, BCPS, [place] [id number] 11/15/2016 4:23 PM

## 2016-11-15 NOTE — Telephone Encounter (Signed)
Per Dr. Lovena Craig Lindsey does not need OV to be cleared. Patient can be cleared with low cardiac risk. Will route to pharmacy for Xarelto recommendation

## 2016-11-16 NOTE — Telephone Encounter (Signed)
   Chart reviewed as part of pre-operative protocol coverage. Decision has already been made per previous notes. Will route bundled note to requesting party and close encounter.   Charlie Pitter, PA-C 11/16/2016, 8:37 AM

## 2016-11-20 DIAGNOSIS — G4733 Obstructive sleep apnea (adult) (pediatric): Secondary | ICD-10-CM | POA: Diagnosis not present

## 2016-12-04 DIAGNOSIS — K633 Ulcer of intestine: Secondary | ICD-10-CM | POA: Insufficient documentation

## 2016-12-04 DIAGNOSIS — I10 Essential (primary) hypertension: Secondary | ICD-10-CM | POA: Insufficient documentation

## 2016-12-04 DIAGNOSIS — G4733 Obstructive sleep apnea (adult) (pediatric): Secondary | ICD-10-CM | POA: Insufficient documentation

## 2016-12-04 DIAGNOSIS — G4730 Sleep apnea, unspecified: Secondary | ICD-10-CM | POA: Insufficient documentation

## 2016-12-04 DIAGNOSIS — K429 Umbilical hernia without obstruction or gangrene: Secondary | ICD-10-CM | POA: Insufficient documentation

## 2016-12-05 DIAGNOSIS — G4733 Obstructive sleep apnea (adult) (pediatric): Secondary | ICD-10-CM | POA: Diagnosis not present

## 2016-12-11 DIAGNOSIS — H524 Presbyopia: Secondary | ICD-10-CM | POA: Diagnosis not present

## 2016-12-12 ENCOUNTER — Telehealth: Payer: Self-pay

## 2016-12-12 NOTE — Telephone Encounter (Signed)
Call placed to Pt.  Notified Pt that per Dr. [name] Pt did not need an office visit.  Per Dr. [name] Pt approved for surgery-low risk.  Pt may hold Xarelto as indicated by pharmacist.  Asked if Pt wanted to see Dr. [name]?  Notified he does not need to if he does not want to.  Per Pt he had previously discussed this surgery with Dr. [name] and did not need to see him.  Will cancel appt and make sure surgical clearance has been submitted.

## 2016-12-12 NOTE — Telephone Encounter (Signed)
Call placed to [facility].  Surgical clearance was not submitted.  Notified that per Dr. [name] Pt low risk for surgery and pharmacy has submitted guidelines for Xarelto on this conversation thread.  Notified to call this nurse if anything addl needed for surgical clearance.  This nurse name and # given.

## 2016-12-15 ENCOUNTER — Encounter: Payer: Self-pay | Admitting: Internal Medicine

## 2017-01-04 ENCOUNTER — Ambulatory Visit (INDEPENDENT_AMBULATORY_CARE_PROVIDER_SITE_OTHER): Payer: Medicare HMO | Admitting: Psychiatry

## 2017-01-04 DIAGNOSIS — F509 Eating disorder, unspecified: Secondary | ICD-10-CM | POA: Diagnosis not present

## 2017-01-04 DIAGNOSIS — G4733 Obstructive sleep apnea (adult) (pediatric): Secondary | ICD-10-CM | POA: Diagnosis not present

## 2017-01-11 ENCOUNTER — Telehealth: Payer: Self-pay

## 2017-01-11 ENCOUNTER — Encounter: Payer: Medicare HMO | Admitting: *Deleted

## 2017-01-11 ENCOUNTER — Telehealth: Payer: Self-pay | Admitting: Cardiology

## 2017-01-11 DIAGNOSIS — Z6841 Body Mass Index (BMI) 40.0 and over, adult: Secondary | ICD-10-CM

## 2017-01-11 NOTE — Telephone Encounter (Signed)
Spoke with pt and reminded pt of remote transmission that is due today. Pt verbalized understanding.   

## 2017-01-11 NOTE — Telephone Encounter (Signed)
Copied from [place] [phone]. Topic: Referral - Request >> Jan 11, 2017  4:20 PM Aurelio Brash B wrote: Reason for CRM: [facility]    [facility]  [phone]) needs another referral for the pt  for bariatric surgery for insurance purposes

## 2017-01-12 ENCOUNTER — Encounter: Payer: Self-pay | Admitting: Cardiology

## 2017-01-15 ENCOUNTER — Ambulatory Visit (INDEPENDENT_AMBULATORY_CARE_PROVIDER_SITE_OTHER): Payer: Medicare HMO | Admitting: *Deleted

## 2017-01-15 DIAGNOSIS — I472 Ventricular tachycardia, unspecified: Secondary | ICD-10-CM

## 2017-01-17 LAB — CUP PACEART REMOTE DEVICE CHECK
Brady Statistic RV Percent Paced: 1.09 %
HIGH POWER IMPEDANCE MEASURED VALUE: 456 Ohm
HIGH POWER IMPEDANCE MEASURED VALUE: 52 Ohm
HighPow Impedance: 66 Ohm
Implantable Lead Model: 6949
Lead Channel Impedance Value: 532 Ohm
Lead Channel Pacing Threshold Pulse Width: 0.4 ms
Lead Channel Sensing Intrinsic Amplitude: 10.75 mV
Lead Channel Setting Pacing Pulse Width: 0.4 ms
Lead Channel Setting Sensing Sensitivity: 0.3 mV
MDC IDC LEAD IMPLANT DT: 20050113
MDC IDC LEAD LOCATION: 753860
MDC IDC MSMT BATTERY VOLTAGE: 2.62 V
MDC IDC MSMT LEADCHNL RV PACING THRESHOLD AMPLITUDE: 0.875 V
MDC IDC MSMT LEADCHNL RV SENSING INTR AMPL: 10.75 mV
MDC IDC PG IMPLANT DT: 20110706
MDC IDC SESS DTM: 20181230185743
MDC IDC SET LEADCHNL RV PACING AMPLITUDE: 2.5 V

## 2017-01-17 NOTE — Progress Notes (Signed)
Remote ICD transmission.   

## 2017-01-18 ENCOUNTER — Ambulatory Visit: Payer: Self-pay | Admitting: General Practice

## 2017-01-19 ENCOUNTER — Encounter: Payer: Self-pay | Admitting: Cardiology

## 2017-01-19 ENCOUNTER — Ambulatory Visit (INDEPENDENT_AMBULATORY_CARE_PROVIDER_SITE_OTHER): Payer: Medicare HMO

## 2017-01-19 DIAGNOSIS — Z23 Encounter for immunization: Secondary | ICD-10-CM

## 2017-02-01 ENCOUNTER — Encounter: Payer: Self-pay | Admitting: Registered"

## 2017-02-01 ENCOUNTER — Encounter: Payer: Medicare HMO | Attending: Surgery | Admitting: Registered"

## 2017-02-01 DIAGNOSIS — E119 Type 2 diabetes mellitus without complications: Secondary | ICD-10-CM

## 2017-02-01 DIAGNOSIS — Z713 Dietary counseling and surveillance: Secondary | ICD-10-CM | POA: Insufficient documentation

## 2017-02-01 NOTE — Patient Instructions (Signed)
-   Aim to eat well-balanced meals using MyPlate method.   - Increase vegetable intake throughout day with meals and snacks.   - Try plain greek yogurt with ranch packet as ranch dressing substitute.   - Increase physical activity to at least 20 min, 3 days/week. Get your heart rate pumping :-)

## 2017-02-01 NOTE — Progress Notes (Signed)
RYGB Appt start time: 2:02 end time: 2:30 Assessment: 6th SWL Appointment.   Start Wt at [facility]: 467.0 Wt: 459.3 BMI: 60.60   Pt arrives having lost 15 lbs from previous visit of pre-op class. Pt states he is disappointed that he has gone through 6 months of SWL visits and unable to have surgery due to miscommunication of him needing to lose 30 lbs prior to surgery. Per communication with [place], pt is to have SWL visits until he has lost 30 lbs.  Pt states he tracks calories throughout day; aims for about 1600-2000 calories. Pt states he has tried and likes a variety (caramel, banana, strawberry) of Premier protein shakes. Pt states he checks his BS 2x/day: FBS (110) and before dinner (138). Pt states he will have A1c next month. Last A1c was Aug 2018 (6.9).   Pt states he does all the cooking at home.  Pt states he has been Chewing more and not drinking with meals, trying to eat better, pt states he only drinks one diet soda a day. Pt states he Takes his water pill every day but he has not taken it yet today because he knew he was leaving the house.   Pt states he has been working on mindful eating and feels he did a good job with that. Pt states he is looking forward to surgery.    Preferred Learning Style:   No preference indicated   Learning Readiness:   Ready  Change in progress  MEDICATIONS: See list   DIETARY INTAKE:  24-hr recall:  B ( AM): protein shake (30g) or eggs, toast, and milk or bran cereal Snk ( AM): none L ( PM): 2 scrambled eggs, hash browns or [place] or ham sandwich with mustard cheese or protein shake or tomato sandwich Snk ( PM): none D ( PM): 1 slice of supreme, 8 wheat thin crackers, slices of cheese  Snk ( PM): pistachio pudding Beverages: 2% milk, Pepsi zero G2, water, tomato juice  Usual physical activity: ADLs (walking about 2000 steps a day), arm exercises 10 min 3 days/week walking, leg lifts about 3000 steps/day, resistance bands   Diet to  Follow: Calories: 1800 Carbohydrate: 200 Protein: 135 Fat: 50  Nutritional Diagnosis:  [place]-3.3 Overweight/obesity related to past poor dietary habits and physical inactivity as evidenced by patient w/ upcoming RYGB surgery following dietary guidelines for continued weight loss.    Intervention:  Nutrition counseling for upcoming Bariatric Surgery.  Goals: - Aim to eat well-balanced meals using MyPlate method.  - Increase vegetable intake throughout day with meals and snacks.  - Try plain greek yogurt with ranch packet as ranch dressing substitute.  - Increase physical activity to at least 20 min, 3 days/week. Get your heart rate pumping :-)  Teaching Method Utilized:  Visual Auditory  Handouts given during visit include:  none  Barriers to learning/adherence to lifestyle change: none  Demonstrated degree of understanding via:  Teach Back   Monitoring/Evaluation:  Dietary intake, exercise, and body weight in 1 month(s).

## 2017-03-05 ENCOUNTER — Ambulatory Visit (INDEPENDENT_AMBULATORY_CARE_PROVIDER_SITE_OTHER): Payer: Medicare HMO | Admitting: Internal Medicine

## 2017-03-05 ENCOUNTER — Encounter: Payer: Self-pay | Admitting: Internal Medicine

## 2017-03-05 VITALS — BP 138/70 | HR 58 | Temp 97.9°F | Ht 76.5 in | Wt >= 6400 oz

## 2017-03-05 DIAGNOSIS — N401 Enlarged prostate with lower urinary tract symptoms: Secondary | ICD-10-CM

## 2017-03-05 DIAGNOSIS — Z1159 Encounter for screening for other viral diseases: Secondary | ICD-10-CM | POA: Diagnosis not present

## 2017-03-05 DIAGNOSIS — I5032 Chronic diastolic (congestive) heart failure: Secondary | ICD-10-CM | POA: Diagnosis not present

## 2017-03-05 DIAGNOSIS — N3943 Post-void dribbling: Secondary | ICD-10-CM | POA: Diagnosis not present

## 2017-03-05 DIAGNOSIS — N183 Chronic kidney disease, stage 3 unspecified: Secondary | ICD-10-CM

## 2017-03-05 DIAGNOSIS — I4891 Unspecified atrial fibrillation: Secondary | ICD-10-CM | POA: Diagnosis not present

## 2017-03-05 DIAGNOSIS — I472 Ventricular tachycardia, unspecified: Secondary | ICD-10-CM

## 2017-03-05 DIAGNOSIS — E1140 Type 2 diabetes mellitus with diabetic neuropathy, unspecified: Secondary | ICD-10-CM

## 2017-03-05 DIAGNOSIS — Z0000 Encounter for general adult medical examination without abnormal findings: Secondary | ICD-10-CM | POA: Diagnosis not present

## 2017-03-05 DIAGNOSIS — Z6841 Body Mass Index (BMI) 40.0 and over, adult: Secondary | ICD-10-CM

## 2017-03-05 LAB — HM DIABETES FOOT EXAM

## 2017-03-05 MED ORDER — RIVAROXABAN 20 MG PO TABS: ORAL_TABLET | ORAL | 3 refills | Status: DC

## 2017-03-05 MED ORDER — LISINOPRIL 40 MG PO TABS: 40.0000 mg | ORAL_TABLET | Freq: Every day | ORAL | 3 refills | Status: DC

## 2017-03-05 MED ORDER — DILTIAZEM HCL ER 240 MG PO CP24: 240.0000 mg | ORAL_CAPSULE | Freq: Every morning | ORAL | 3 refills | Status: DC

## 2017-03-05 MED ORDER — FUROSEMIDE 40 MG PO TABS: ORAL_TABLET | ORAL | 3 refills | Status: DC

## 2017-03-05 MED ORDER — GLIPIZIDE ER 10 MG PO TB24: 10.0000 mg | ORAL_TABLET | Freq: Every day | ORAL | 3 refills | Status: DC

## 2017-03-05 MED ORDER — METOPROLOL SUCCINATE ER 100 MG PO TB24: 200.0000 mg | ORAL_TABLET | Freq: Every day | ORAL | 3 refills | Status: DC

## 2017-03-05 MED ORDER — AMIODARONE HCL 200 MG PO TABS: 200.0000 mg | ORAL_TABLET | Freq: Every day | ORAL | 3 refills | Status: DC

## 2017-03-05 MED ORDER — ATORVASTATIN CALCIUM 10 MG PO TABS: 10.0000 mg | ORAL_TABLET | Freq: Every day | ORAL | 3 refills | Status: DC

## 2017-03-05 NOTE — Assessment & Plan Note (Signed)
Paced, regular On amiodarone

## 2017-03-05 NOTE — Assessment & Plan Note (Signed)
I have personally reviewed the Medicare Annual Wellness questionnaire and have noted 1. The patient's medical and social history 2. Their use of alcohol, tobacco or illicit drugs 3. Their current medications and supplements 4. The patient's functional ability including ADL's, fall risks, home safety risks and hearing or visual             impairment. 5. Diet and physical activities 6. Evidence for depression or mood disorders  The patients weight, height, BMI and visual acuity have been recorded in the chart I have made referrals, counseling and provided education to the patient based review of the above and I have provided the pt with a written personalized care plan for preventive services.  I have provided you with a copy of your personalized plan for preventive services. Please take the time to review along with your updated medication list.  Yearly flu vaccine Due for colon in 2020 Will check PSA after discussion Trying to walk

## 2017-03-05 NOTE — Assessment & Plan Note (Signed)
Compensated No symptoms

## 2017-03-05 NOTE — Assessment & Plan Note (Signed)
No recurrence on the amiodarone Has defibrillator due to this

## 2017-03-05 NOTE — Progress Notes (Signed)
Subjective:    Patient ID: Craig Lindsey, male    DOB: March 28, 1960, 57 y.o.   MRN: [id number]  HPI Here for Medicare wellness visit and follow up of chronic health conditions Reviewed form and advanced directives Reviewed other doctors No alcohol or tobacco Vision and hearing fine Trying to walk regularly---up to 2500 steps a day No falls No depression or anhedonia independent with instrumental ADLs No memory problems  Having trouble with right knee "grinded and popped" getting off commode a few days ago Some bad pain---using heat and improved Walking with cane for stability  Still awaiting bariatric surgery Went through the 6 months with the nutritionist--but not able to lose the weight expected  Got cardiology clearance for procedure No palpitations  No chest pain No SOB No ankle edema No dizziness or syncope  Checks sugars twice a day Usually 93-135 fasting with his improved eating No hypoglycemic spells Ongoing foot numbness--not really painful   Current Outpatient Medications on File Prior to Visit  Medication Sig Dispense Refill  . ACCU-CHEK FASTCLIX LANCETS MISC Use to check blood sugar three times a day.  Dx: E11.40 306 each 3  . amiodarone (PACERONE) 200 MG tablet Take 1 tablet (200 mg total) by mouth daily. 90 tablet 3  . atorvastatin (LIPITOR) 10 MG tablet Take 10 mg by mouth daily at 6 PM.    . BD ULTRA-FINE PEN NEEDLES 29G X 12.7MM MISC USE  TO INJECT TWICE DAILY  TO THREE TIMES DAILY 270 each 3  . DILT-XR 240 MG 24 hr capsule TAKE 1 CAPSULE EVERY MORNING. 90 capsule 2  . furosemide (LASIX) 40 MG tablet Take one tablet by mouth daily and as needed 135 tablet 3  . glipiZIDE (GLUCOTROL XL) 10 MG 24 hr tablet Take 1 tablet (10 mg total) by mouth daily with breakfast. 90 tablet 3  . glucose blood (ACCU-CHEK SMARTVIEW) test strip Use as instructed to test blood sugar three times daily E11.21 100 each 12  . insulin aspart protamine - aspart (NOVOLOG MIX 70/30  FLEXPEN) (70-30) 100 UNIT/ML FlexPen USE SLIDING SCALE BETWEEN 50 TO 70 UNITS TWICE DAILY (WITH BREAKFAST AND SUPPER). MAX DAILY DOSE 150 UNITS 45 mL 5  . lisinopril (PRINIVIL,ZESTRIL) 40 MG tablet Take 1 tablet (40 mg total) by mouth daily. 90 tablet 3  . metoprolol succinate (TOPROL-XL) 100 MG 24 hr tablet Take 2 tablets (200 mg total) by mouth daily. 180 tablet 3  . multivitamin (THERAGRAN) per tablet Take 1 tablet by mouth daily.      . NON FORMULARY Knee high compression hose 20-30 mmhg. Apply daily and remove at bedtime.     Alveda Reasons 20 MG TABS tablet TAKE 1 TABLET EVERY DAY WITH SUPPER 90 tablet 1   No current facility-administered medications on file prior to visit.     No Known Allergies  Past Medical History:  Diagnosis Date  . Arrhythmia 12/04   1. Ventricular tachycardia; Dr. [name]  2. Atrial fibrillation  . Chronic kidney disease    Complex L renal cyst, no tx for   . Diabetes mellitus    Type II  . DVT (deep venous thrombosis) ([place]) 7/09   RLE, Tx Arixtra  . Hemorrhoids    hx of  . Hyperlipidemia   . Hypertension   . Neuropathy    Oxaliplatin  . Obesity   . Personal history of colon cancer, stage III 1/09   Dr. Carlean Purl, Benay Spice, D. Newman  . Sigmoid colon ulcer 5  yrs ago   Sigmoid; Nydia Bouton, MD (colon cancer)  . Sleep apnea    uses cpap setting of 10  . Umbilical hernia     Past Surgical History:  Procedure Laterality Date  . CARDIAC DEFIBRILLATOR PLACEMENT    . CHOLECYSTECTOMY    . COLECTOMY  1/09   Sigmoid; Nydia Bouton, MD (colon cancer)  . COLONOSCOPY    . COLONOSCOPY N/A 02/25/2013   Procedure: COLONOSCOPY;  Surgeon: Gatha Mayer, MD;  Location: [facility];  Service: Endoscopy;  Laterality: N/A;  . HERNIA REPAIR  yrs ago   umb hernia    Family History  Problem Relation Age of Onset  . Diabetes Father   . Cancer Father        myelofibrosis  . Colon cancer Other   . Diabetes Maternal Grandmother   . Cancer Maternal Aunt        colon  .  Hypertension Neg Hx   . Coronary artery disease Neg Hx   . Prostate cancer Neg Hx     Social History   Socioeconomic History  . Marital status: Married    Spouse name: Not on file  . Number of children: 2  . Years of education: Not on file  . Highest education level: Not on file  Social Needs  . Financial resource strain: Not on file  . Food insecurity - worry: Not on file  . Food insecurity - inability: Not on file  . Transportation needs - medical: Not on file  . Transportation needs - non-medical: Not on file  Occupational History  . 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 Smoking status: Never Smoker  . Smokeless tobacco: Never Used  Substance and Sexual Activity  . Alcohol use: No    Alcohol/week: 0.0 oz  . Drug use: No  . Sexual activity: Yes  Other Topics Concern  . Not on file  Social History Narrative   2 step children from wife's 2nd marriage.      Has living will   Wife is health care POA---alternate would be sister, Vickie Epley   Would accept resuscitation attempts   Not sure about tube feedings   Review of Systems No recent issues with DVT Sleeps well with CPAP ---uses it every night No problems with appetite No skin problems Bowels are fine Voids with fairly good stream. Occasional mild dribbling No heartburn or dysphagia Wears seat belt Teeth okay---way overdue for dentist    Objective:   Physical Exam  Constitutional: He is oriented to person, place, and time. He appears well-developed and well-nourished. No distress.  HENT:  Mouth/Throat: Oropharynx is clear and moist. No oropharyngeal exudate.  Neck: No thyromegaly present.  Cardiovascular: Normal rate, regular rhythm, normal heart sounds and intact distal pulses. Exam reveals no gallop.  No murmur heard. Pulmonary/Chest: Effort normal and breath sounds normal. No respiratory distress. He has no wheezes. He has no rales.  Abdominal: Soft. There is no tenderness.    Ventral hernia--right mid abd  Musculoskeletal: He exhibits no edema or tenderness.  Right knee --no effusion Fairly good flexion No meniscus or ligament findings (discussed just observing this for now)  Lymphadenopathy:    He has no cervical adenopathy.  Neurological: He is alert and oriented to person, place, and time.  President--- "[name], ? [phone] [name] Recall 1/3  Decreased sensation in feet  Skin:  No foot lesions  Psychiatric: He has a normal mood and affect. His  behavior is normal.          Assessment & Plan:

## 2017-03-05 NOTE — Assessment & Plan Note (Signed)
Awaiting bariatric surgery

## 2017-03-05 NOTE — Assessment & Plan Note (Signed)
On ACEI  Will recheck labs 

## 2017-03-05 NOTE — Addendum Note (Signed)
Addended by: Lendon Collar on: 03/05/2017 04:26 PM   Modules accepted: Orders

## 2017-03-05 NOTE — Assessment & Plan Note (Signed)
Seems to still have good control Will check labs 

## 2017-03-06 LAB — COMPREHENSIVE METABOLIC PANEL
ALK PHOS: 68 U/L (ref 39–117)
ALT: 26 U/L (ref 0–53)
AST: 18 U/L (ref 0–37)
Albumin: 4.1 g/dL (ref 3.5–5.2)
BUN: 23 mg/dL (ref 6–23)
CO2: 25 mEq/L (ref 19–32)
Calcium: 9.6 mg/dL (ref 8.4–10.5)
Chloride: 106 mEq/L (ref 96–112)
Creatinine, Ser: 1.39 mg/dL (ref 0.40–1.50)
GFR: 56.02 mL/min — AB (ref >60.00)
GLUCOSE: 97 mg/dL (ref 70–99)
Potassium: 4.9 mEq/L (ref 3.5–5.1)
Sodium: 140 mEq/L (ref 135–145)
TOTAL PROTEIN: 7.5 g/dL (ref 6.0–8.3)
Total Bilirubin: 0.5 mg/dL (ref 0.2–1.2)

## 2017-03-06 LAB — CBC
HCT: 51.2 % (ref 39.0–52.0)
HEMOGLOBIN: 17.3 g/dL — AB (ref 13.0–17.0)
MCHC: 33.8 g/dL (ref 30.0–36.0)
MCV: 93.5 fl (ref 78.0–100.0)
PLATELETS: 166 10*3/uL (ref 150.0–400.0)
RBC: 5.47 Mil/uL (ref 4.22–5.81)
RDW: 14 % (ref 11.5–15.5)
WBC: 9.4 10*3/uL (ref 4.0–10.5)

## 2017-03-06 LAB — HEMOGLOBIN A1C: Hgb A1c MFr Bld: 6.2 % (ref 4.6–6.5)

## 2017-03-06 LAB — LIPID PANEL
Cholesterol: 121 mg/dL (ref 0–200)
HDL: 28 mg/dL — AB (ref >39.00)
LDL Cholesterol: 58 mg/dL (ref 0–99)
NONHDL: 92.8
Total CHOL/HDL Ratio: 4
Triglycerides: 172 mg/dL — ABNORMAL HIGH (ref 0.0–149.0)
VLDL: 34.4 mg/dL (ref 0.0–40.0)

## 2017-03-06 LAB — HEPATITIS C ANTIBODY
HEP C AB: NONREACTIVE
SIGNAL TO CUT-OFF: 0.02 (ref <1.00)

## 2017-03-06 LAB — PSA: PSA: 0.55 ng/mL (ref 0.10–4.00)

## 2017-03-08 ENCOUNTER — Encounter: Payer: Medicare HMO | Attending: Surgery | Admitting: Registered"

## 2017-03-08 ENCOUNTER — Encounter: Payer: Self-pay | Admitting: Registered"

## 2017-03-08 DIAGNOSIS — Z713 Dietary counseling and surveillance: Secondary | ICD-10-CM | POA: Insufficient documentation

## 2017-03-08 DIAGNOSIS — E119 Type 2 diabetes mellitus without complications: Secondary | ICD-10-CM

## 2017-03-08 DIAGNOSIS — E669 Obesity, unspecified: Secondary | ICD-10-CM

## 2017-03-08 NOTE — Patient Instructions (Addendum)
-   Aim to decrease carbonation to 1 soda every other day.   - Keep up the great work!

## 2017-03-08 NOTE — Progress Notes (Signed)
[facility] Appt start time: 2:02 end time: 2:30 Assessment: 7th SWL Appointment.   Start Wt at NDES: 467.0 Wt: 453.0 BMI: 59.77   Pt arrives having lost 6 lbs from previous visit. Pt states he is disappointed that he has gone through 6 months of SWL visits and unable to have surgery due to miscommunication of him needing to lose 30 lbs prior to surgery. Per communication with [facility], pt is to have SWL visits until he has lost 30 lbs.  Pt states he tracks calories throughout day; averages 1500-1600 calories. Pt states he has tried and likes a variety (caramel, banana, strawberry) of Premier protein shakes. Pt states he checks his BS 2x/day: FBS (94) and before dinner (125-140). Pt reports a decrease in A1c; A1c earlier this week (6.2). Pt states he tried [place] yogurt with ranch dressing and it was ok; different texture. Pt states he is very disappointed with [facility] with communication; takes them 2 weeks to call him back. Pt states he has increased physical activity to about 20 min a day. Pt is making great behavioral changes.   Pt states he does all the cooking at home.  Pt states he has been Chewing more and not drinking with meals, trying to eat better, pt states he only drinks one diet soda a day.    Pt states he has been working on mindful eating and feels he did a good job with that. Pt states he is looking forward to surgery.    Preferred Learning Style:   No preference indicated   Learning Readiness:   Ready  Change in progress  MEDICATIONS: See list   DIETARY INTAKE:  24-hr recall:  B ( AM): oatmeal/cereal, frozen fruit or protein shake (30g) or eggs, toast, and milk or bran cereal Snk ( AM): vegetables L ( PM): wheat wraps, chicken/turkey, swiss cheese  Snk ( PM): none D ( PM): chicken breast, salad or salmon, vegetables   Snk ( PM): sometimes cheese its  Beverages: 2% milk, Pepsi zero, G2, water, tomato juice  Usual physical activity: ADLs (walking about 2000 steps a day), arm  exercises 10 min 3 days/week walking, leg lifts about 3000 steps/day, resistance bands   Diet to Follow: Calories: 1800 Carbohydrate: 200 Protein: 135 Fat: 50  Nutritional Diagnosis:  [place]-3.3 Overweight/obesity related to past poor dietary habits and physical inactivity as evidenced by patient w/ upcoming RYGB surgery following dietary guidelines for continued weight loss.    Intervention:  Nutrition counseling for upcoming Bariatric Surgery.  Goals: - Aim to decrease carbonation to 1 soda every other day.  - Keep up the great work!  Teaching Method Utilized:  Visual Auditory  Handouts given during visit include:  none  Barriers to learning/adherence to lifestyle change: none  Demonstrated degree of understanding via:  Teach Back   Monitoring/Evaluation:  Dietary intake, exercise, and body weight in 1 month(s).

## 2017-03-20 ENCOUNTER — Telehealth: Payer: Self-pay | Admitting: Cardiology

## 2017-03-20 DIAGNOSIS — E1169 Type 2 diabetes mellitus with other specified complication: Secondary | ICD-10-CM | POA: Diagnosis not present

## 2017-03-20 DIAGNOSIS — K432 Incisional hernia without obstruction or gangrene: Secondary | ICD-10-CM | POA: Diagnosis not present

## 2017-03-20 DIAGNOSIS — G4733 Obstructive sleep apnea (adult) (pediatric): Secondary | ICD-10-CM | POA: Diagnosis not present

## 2017-03-20 DIAGNOSIS — E669 Obesity, unspecified: Secondary | ICD-10-CM | POA: Diagnosis not present

## 2017-03-20 DIAGNOSIS — Z7901 Long term (current) use of anticoagulants: Secondary | ICD-10-CM | POA: Diagnosis not present

## 2017-03-20 NOTE — Telephone Encounter (Signed)
Patient called and stated that his ICD has beeped at 7 AM the last two morning. I instructed pt to send a manual transmission once the transmission is received a Device Tech RN will review and call back w/ results. Pt verbalized understanding and said he will not be home for a couple hours but he will do it as soon as he gets home.

## 2017-03-20 NOTE — Telephone Encounter (Signed)
Transmission received. RRT on 03/19/17. Craig Lindsey made aware- he has an appt with Dr. [name] on 05/14/17. He is aware that the device will give a daily alert tone and it can be programmed off in the office if he wishes- he declines. He seems reluctant to get the device replaced as he has never has therapy from it. He knows that he should discuss this with Dr. [name] during his appointment.

## 2017-03-21 ENCOUNTER — Encounter: Payer: Self-pay | Admitting: Internal Medicine

## 2017-03-21 ENCOUNTER — Telehealth: Payer: Self-pay

## 2017-03-23 ENCOUNTER — Telehealth: Payer: Self-pay

## 2017-03-23 ENCOUNTER — Encounter: Payer: Self-pay | Admitting: Internal Medicine

## 2017-03-23 DIAGNOSIS — I5032 Chronic diastolic (congestive) heart failure: Secondary | ICD-10-CM

## 2017-03-23 NOTE — Telephone Encounter (Signed)
Per Dr. [name] advisement-Pt should get ECHO and gen change prior to bariatric surgery.  Pt agreed. Echo order entered.  Will discuss with scheduling to move up appt with Dr. [name].

## 2017-03-26 DIAGNOSIS — I10 Essential (primary) hypertension: Secondary | ICD-10-CM | POA: Diagnosis not present

## 2017-03-29 ENCOUNTER — Telehealth: Payer: Self-pay | Admitting: *Deleted

## 2017-03-29 DIAGNOSIS — Z6841 Body Mass Index (BMI) 40.0 and over, adult: Secondary | ICD-10-CM

## 2017-03-29 DIAGNOSIS — K439 Ventral hernia without obstruction or gangrene: Secondary | ICD-10-CM

## 2017-03-29 NOTE — Telephone Encounter (Signed)
Copied from [place] [id number]. Topic: Referral - Request >> Mar 29, 2017  2:14 PM Vernona Rieger wrote: Reason for CRM: [facility] needs an updated referral for umbilical construction & morbid obesity. Please fax to [phone]

## 2017-04-02 ENCOUNTER — Encounter: Payer: Self-pay | Admitting: Internal Medicine

## 2017-04-02 ENCOUNTER — Ambulatory Visit: Payer: Medicare HMO | Admitting: Internal Medicine

## 2017-04-02 ENCOUNTER — Other Ambulatory Visit (HOSPITAL_COMMUNITY): Payer: Self-pay

## 2017-04-02 VITALS — BP 128/72 | HR 73 | Ht 73.0 in | Wt >= 6400 oz

## 2017-04-02 DIAGNOSIS — I4891 Unspecified atrial fibrillation: Secondary | ICD-10-CM | POA: Diagnosis not present

## 2017-04-02 DIAGNOSIS — Z95810 Presence of automatic (implantable) cardiac defibrillator: Secondary | ICD-10-CM | POA: Diagnosis not present

## 2017-04-02 DIAGNOSIS — I4901 Ventricular fibrillation: Secondary | ICD-10-CM | POA: Diagnosis not present

## 2017-04-02 DIAGNOSIS — I5032 Chronic diastolic (congestive) heart failure: Secondary | ICD-10-CM

## 2017-04-02 NOTE — Patient Instructions (Addendum)
Medication Instructions:  Your physician recommends that you continue on your current medications as directed. Please refer to the Current Medication list given to you today.  Labwork: You will get lab work today:  BMP and CBC.  Testing/Procedures: Your physician has recommended that you have a defibrillator inserted. An implantable cardioverter defibrillator (ICD) is a small device that is placed in your chest or, in rare cases, your abdomen. This device uses electrical pulses or shocks to help control life-threatening, irregular heartbeats that could lead the heart to suddenly stop beating (sudden cardiac arrest). Leads are attached to the ICD that goes into your heart. This is done in the hospital and usually requires an overnight stay. Please see the instruction sheet given to you today for more information.  Your defibrillator generator needs to be replaced.  Follow-Up: You will follow up with device clinic 10-14 days after your procedure for a wound check.  You will follow up with Dr. [name] 91 days after your procedure.  Any Other Special Instructions Will Be Listed Below (If Applicable).  Please arrive at the [facility] main entrance of [facility] hospital at:  9:30 am on April 12, 2017  Use the CHG surgical scrub as directed.  Do not eat or drink after midnight prior to procedure.  Do NOT take your XARELTO for 2 days prior to your procedure.  Your last dose will be April 09, 2017. The night before your surgery take 1/2 the normal amount of insulin. The AM of your procedure you may take your normal morning medications except for:  LASIX and GLIPIZIDE.  You will be discharged after your procedure.  You will need someone to drive you home at discharge If you need a refill on your cardiac medications before your next appointment, please call your pharmacy.

## 2017-04-02 NOTE — H&P (View-Only) (Signed)
HPI [name] returns today for ongoing evaluation and management of his ICD. He has a h/o VF arrest over 14 years ago. He has never had another ICD therapy. He has a h/o atrial fib but has maintained NSR on amiodarone. He has morbid obesity and is pending bariatric surgery in about a month. He has class 2 CHF symptoms and LBBB. His 2D echo is pending. No Known Allergies   Current Outpatient Medications  Medication Sig Dispense Refill  . ACCU-CHEK FASTCLIX LANCETS MISC Use to check blood sugar three times a day.  Dx: E11.40 306 each 3  . amiodarone (PACERONE) 200 MG tablet Take 1 tablet (200 mg total) by mouth daily. 90 tablet 3  . atorvastatin (LIPITOR) 10 MG tablet Take 1 tablet (10 mg total) by mouth daily at 6 PM. 90 tablet 3  . BD ULTRA-FINE PEN NEEDLES 29G X 12.7MM MISC USE  TO INJECT TWICE DAILY  TO THREE TIMES DAILY 270 each 3  . diltiazem (DILT-XR) 240 MG 24 hr capsule Take 1 capsule (240 mg total) by mouth every morning. 90 capsule 3  . furosemide (LASIX) 40 MG tablet Take one tablet by mouth daily and as needed 135 tablet 3  . glipiZIDE (GLUCOTROL XL) 10 MG 24 hr tablet Take 1 tablet (10 mg total) by mouth daily with breakfast. 90 tablet 3  . glucose blood (ACCU-CHEK SMARTVIEW) test strip Use as instructed to test blood sugar three times daily E11.21 100 each 12  . insulin aspart protamine - aspart (NOVOLOG MIX 70/30 FLEXPEN) (70-30) 100 UNIT/ML FlexPen USE SLIDING SCALE BETWEEN 50 TO 70 UNITS TWICE DAILY (WITH BREAKFAST AND SUPPER). MAX DAILY DOSE 150 UNITS 45 mL 5  . lisinopril (PRINIVIL,ZESTRIL) 40 MG tablet Take 1 tablet (40 mg total) by mouth daily. 90 tablet 3  . metoprolol succinate (TOPROL-XL) 100 MG 24 hr tablet Take 2 tablets (200 mg total) by mouth daily. 180 tablet 3  . multivitamin (THERAGRAN) per tablet Take 1 tablet by mouth daily.      . NON FORMULARY Knee high compression hose 20-30 mmhg. Apply daily and remove at bedtime.     . rivaroxaban (XARELTO) 20 MG  TABS tablet TAKE 1 TABLET EVERY DAY WITH SUPPER 90 tablet 3   No current facility-administered medications for this visit.      Past Medical History:  Diagnosis Date  . Arrhythmia 12/04   1. Ventricular tachycardia; Dr. [name]  2. Atrial fibrillation  . Chronic kidney disease    Complex L renal cyst, no tx for   . Diabetes mellitus    Type II  . DVT (deep venous thrombosis) ([place]) 7/09   RLE, Tx Arixtra  . Hemorrhoids    hx of  . Hyperlipidemia   . Hypertension   . Neuropathy    Oxaliplatin  . Obesity   . Personal history of colon cancer, stage III 1/09   Dr. Carlean Purl, Benay Spice, D. Newman  . Sigmoid colon ulcer 5 yrs ago   Sigmoid; Nydia Bouton, MD (colon cancer)  . Sleep apnea    uses cpap setting of 10  . Umbilical hernia     ROS:   All systems reviewed and negative except as noted in the HPI.   Past Surgical History:  Procedure Laterality Date  . CARDIAC DEFIBRILLATOR PLACEMENT    . CHOLECYSTECTOMY    . COLECTOMY  1/09   Sigmoid; Nydia Bouton, MD (colon cancer)  . COLONOSCOPY    . COLONOSCOPY N/A  02/25/2013   Procedure: COLONOSCOPY;  Surgeon: Gatha Mayer, MD;  Location: [facility];  Service: Endoscopy;  Laterality: N/A;  . HERNIA REPAIR  yrs ago   umb hernia     Family History  Problem Relation Age of Onset  . Diabetes Father   . Cancer Father        myelofibrosis  . Colon cancer Other   . Diabetes Maternal Grandmother   . Cancer Maternal Aunt        colon  . Hypertension Neg Hx   . Coronary artery disease Neg Hx   . Prostate cancer Neg Hx      Social History   Socioeconomic History  . Marital status: Married    Spouse name: Not on file  . Number of children: 2  . Years of education: Not on file  . Highest education level: Not on file  Social Needs  . Financial resource strain: Not on file  . Food insecurity - worry: Not on file  . Food insecurity - inability: Not on file  . Transportation needs - medical: Not on file  . Transportation  needs - non-medical: Not on file  Occupational History  . 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 Smoking status: Never Smoker  . Smokeless tobacco: Never Used  Substance and Sexual Activity  . Alcohol use: No    Alcohol/week: 0.0 oz  . Drug use: No  . Sexual activity: Yes  Other Topics Concern  . Not on file  Social History Narrative   2 step children from wife's 2nd marriage.      Has living will   Wife is health care POA---alternate would be sister, Vickie Epley   Would accept resuscitation attempts   Not sure about tube feedings     BP 128/72   Pulse 73   Ht 6\' 1"  (1.854 m)   Wt (!) 453 lb (205.5 kg)   BMI 59.77 kg/m   Physical Exam:  Morbidly obese appearing NAD HEENT: Unremarkable Neck:  6 cm JVD, no thyromegally Lymphatics:  No adenopathy Back:  No CVA tenderness Lungs:  Clear with no wheezes HEART:  Regular rate rhythm, no murmurs, no rubs, no clicks Abd:  soft, positive bowel sounds, no organomegally, no rebound, no guarding Ext:  2 plus pulses, no edema, no cyanosis, no clubbing Skin:  No rashes no nodules Neuro:  CN II through XII intact, motor grossly intact  EKG - NSR with LBBB  DEVICE  Normal device function.  See PaceArt for details. ERI.  Assess/Plan: 1. VF - he has had no recurrent episodes. Will follow. 2. ICD - he is at [facility]. We discussed the treatment options with regard to gen change and he would like to proceed with this before undergoing bariatric surgery.  3. Obesity - he is 200 lbs over weight. Hopefully he can lose some of this weight after his surgery. 4. HTN - his blood pressure is well controlled. Will follow.  [name].D.

## 2017-04-02 NOTE — Progress Notes (Signed)
HPI [name] returns today for ongoing evaluation and management of his ICD. He has a h/o VF arrest over 14 years ago. He has never had another ICD therapy. He has a h/o atrial fib but has maintained NSR on amiodarone. He has morbid obesity and is pending bariatric surgery in about a month. He has class 2 CHF symptoms and LBBB. His 2D echo is pending. No Known Allergies   Current Outpatient Medications  Medication Sig Dispense Refill  . ACCU-CHEK FASTCLIX LANCETS MISC Use to check blood sugar three times a day.  Dx: E11.40 306 each 3  . amiodarone (PACERONE) 200 MG tablet Take 1 tablet (200 mg total) by mouth daily. 90 tablet 3  . atorvastatin (LIPITOR) 10 MG tablet Take 1 tablet (10 mg total) by mouth daily at 6 PM. 90 tablet 3  . BD ULTRA-FINE PEN NEEDLES 29G X 12.7MM MISC USE  TO INJECT TWICE DAILY  TO THREE TIMES DAILY 270 each 3  . diltiazem (DILT-XR) 240 MG 24 hr capsule Take 1 capsule (240 mg total) by mouth every morning. 90 capsule 3  . furosemide (LASIX) 40 MG tablet Take one tablet by mouth daily and as needed 135 tablet 3  . glipiZIDE (GLUCOTROL XL) 10 MG 24 hr tablet Take 1 tablet (10 mg total) by mouth daily with breakfast. 90 tablet 3  . glucose blood (ACCU-CHEK SMARTVIEW) test strip Use as instructed to test blood sugar three times daily E11.21 100 each 12  . insulin aspart protamine - aspart (NOVOLOG MIX 70/30 FLEXPEN) (70-30) 100 UNIT/ML FlexPen USE SLIDING SCALE BETWEEN 50 TO 70 UNITS TWICE DAILY (WITH BREAKFAST AND SUPPER). MAX DAILY DOSE 150 UNITS 45 mL 5  . lisinopril (PRINIVIL,ZESTRIL) 40 MG tablet Take 1 tablet (40 mg total) by mouth daily. 90 tablet 3  . metoprolol succinate (TOPROL-XL) 100 MG 24 hr tablet Take 2 tablets (200 mg total) by mouth daily. 180 tablet 3  . multivitamin (THERAGRAN) per tablet Take 1 tablet by mouth daily.      . NON FORMULARY Knee high compression hose 20-30 mmhg. Apply daily and remove at bedtime.     . rivaroxaban (XARELTO) 20 MG  TABS tablet TAKE 1 TABLET EVERY DAY WITH SUPPER 90 tablet 3   No current facility-administered medications for this visit.      Past Medical History:  Diagnosis Date  . Arrhythmia 12/04   1. Ventricular tachycardia; Dr. [name]  2. Atrial fibrillation  . Chronic kidney disease    Complex L renal cyst, no tx for   . Diabetes mellitus    Type II  . DVT (deep venous thrombosis) ([place]) 7/09   RLE, Tx Arixtra  . Hemorrhoids    hx of  . Hyperlipidemia   . Hypertension   . Neuropathy    Oxaliplatin  . Obesity   . Personal history of colon cancer, stage III 1/09   Dr. Carlean Purl, Benay Spice, D. Newman  . Sigmoid colon ulcer 5 yrs ago   Sigmoid; Nydia Bouton, MD (colon cancer)  . Sleep apnea    uses cpap setting of 10  . Umbilical hernia     ROS:   All systems reviewed and negative except as noted in the HPI.   Past Surgical History:  Procedure Laterality Date  . CARDIAC DEFIBRILLATOR PLACEMENT    . CHOLECYSTECTOMY    . COLECTOMY  1/09   Sigmoid; Nydia Bouton, MD (colon cancer)  . COLONOSCOPY    . COLONOSCOPY N/A  02/25/2013   Procedure: COLONOSCOPY;  Surgeon: Gatha Mayer, MD;  Location: [facility];  Service: Endoscopy;  Laterality: N/A;  . HERNIA REPAIR  yrs ago   umb hernia     Family History  Problem Relation Age of Onset  . Diabetes Father   . Cancer Father        myelofibrosis  . Colon cancer Other   . Diabetes Maternal Grandmother   . Cancer Maternal Aunt        colon  . Hypertension Neg Hx   . Coronary artery disease Neg Hx   . Prostate cancer Neg Hx      Social History   Socioeconomic History  . Marital status: Married    Spouse name: Not on file  . Number of children: 2  . Years of education: Not on file  . Highest education level: Not on file  Social Needs  . Financial resource strain: Not on file  . Food insecurity - worry: Not on file  . Food insecurity - inability: Not on file  . Transportation needs - medical: Not on file  . Transportation  needs - non-medical: Not on file  Occupational History  . 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 Smoking status: Never Smoker  . Smokeless tobacco: Never Used  Substance and Sexual Activity  . Alcohol use: No    Alcohol/week: 0.0 oz  . Drug use: No  . Sexual activity: Yes  Other Topics Concern  . Not on file  Social History Narrative   2 step children from wife's 2nd marriage.      Has living will   Wife is health care POA---alternate would be sister, Vickie Epley   Would accept resuscitation attempts   Not sure about tube feedings     BP 128/72   Pulse 73   Ht 6\' 1"  (1.854 m)   Wt (!) 453 lb (205.5 kg)   BMI 59.77 kg/m   Physical Exam:  Morbidly obese appearing NAD HEENT: Unremarkable Neck:  6 cm JVD, no thyromegally Lymphatics:  No adenopathy Back:  No CVA tenderness Lungs:  Clear with no wheezes HEART:  Regular rate rhythm, no murmurs, no rubs, no clicks Abd:  soft, positive bowel sounds, no organomegally, no rebound, no guarding Ext:  2 plus pulses, no edema, no cyanosis, no clubbing Skin:  No rashes no nodules Neuro:  CN II through XII intact, motor grossly intact  EKG - NSR with LBBB  DEVICE  Normal device function.  See PaceArt for details. ERI.  Assess/Plan: 1. VF - he has had no recurrent episodes. Will follow. 2. ICD - he is at [facility]. We discussed the treatment options with regard to gen change and he would like to proceed with this before undergoing bariatric surgery.  3. Obesity - he is 200 lbs over weight. Hopefully he can lose some of this weight after his surgery. 4. HTN - his blood pressure is well controlled. Will follow.  Craig Lindsey.D.

## 2017-04-03 ENCOUNTER — Encounter: Payer: Self-pay | Admitting: Internal Medicine

## 2017-04-03 LAB — CUP PACEART INCLINIC DEVICE CHECK
Battery Voltage: 2.61 V
Brady Statistic RV Percent Paced: 1.12 %
Date Time Interrogation Session: 20190318180349
HIGH POWER IMPEDANCE MEASURED VALUE: 418 Ohm
HIGH POWER IMPEDANCE MEASURED VALUE: 53 Ohm
HIGH POWER IMPEDANCE MEASURED VALUE: 65 Ohm
Implantable Lead Implant Date: 20050113
Lead Channel Pacing Threshold Pulse Width: 0.4 ms
Lead Channel Sensing Intrinsic Amplitude: 11.125 mV
Lead Channel Sensing Intrinsic Amplitude: 11.625 mV
Lead Channel Setting Pacing Amplitude: 2.5 V
Lead Channel Setting Pacing Pulse Width: 0.4 ms
MDC IDC LEAD LOCATION: 753860
MDC IDC MSMT LEADCHNL RV IMPEDANCE VALUE: 551 Ohm
MDC IDC MSMT LEADCHNL RV PACING THRESHOLD AMPLITUDE: 1 V
MDC IDC PG IMPLANT DT: 20110706
MDC IDC SET LEADCHNL RV SENSING SENSITIVITY: 0.3 mV

## 2017-04-05 ENCOUNTER — Ambulatory Visit: Payer: Self-pay | Admitting: Registered"

## 2017-04-05 ENCOUNTER — Telehealth: Payer: Self-pay

## 2017-04-05 LAB — CBC WITH DIFFERENTIAL/PLATELET
Basophils Absolute: 0 10*3/uL (ref 0.0–0.2)
Basos: 0 %
EOS (ABSOLUTE): 0.1 10*3/uL (ref 0.0–0.4)
EOS: 2 %
HEMATOCRIT: 51.8 % — AB (ref 37.5–51.0)
HEMOGLOBIN: 16.8 g/dL (ref 13.0–17.7)
IMMATURE GRANS (ABS): 0 10*3/uL (ref 0.0–0.1)
Immature Granulocytes: 0 %
LYMPHS ABS: 2.4 10*3/uL (ref 0.7–3.1)
LYMPHS: 26 %
MCH: 30.9 pg (ref 26.6–33.0)
MCHC: 32.4 g/dL (ref 31.5–35.7)
MCV: 95 fL (ref 79–97)
MONOCYTES: 8 %
Monocytes Absolute: 0.7 10*3/uL (ref 0.1–0.9)
Neutrophils Absolute: 6 10*3/uL (ref 1.4–7.0)
Neutrophils: 64 %
Platelets: 186 10*3/uL (ref 150–379)
RBC: 5.43 x10E6/uL (ref 4.14–5.80)
RDW: 14 % (ref 12.3–15.4)
WBC: 9.3 10*3/uL (ref 3.4–10.8)

## 2017-04-05 LAB — BASIC METABOLIC PANEL
BUN / CREAT RATIO: 9 (ref 9–20)
BUN: 23 mg/dL (ref 6–24)
CO2: 25 mmol/L (ref 20–29)
CREATININE: 2.54 mg/dL — AB (ref 0.76–1.27)
Calcium: 8.9 mg/dL (ref 8.7–10.2)
Chloride: 105 mmol/L (ref 96–106)
GFR calc Af Amer: 31 mL/min/{1.73_m2} — ABNORMAL LOW (ref >59)
GFR calc non Af Amer: 27 mL/min/{1.73_m2} — ABNORMAL LOW (ref >59)
GLUCOSE: 114 mg/dL — AB (ref 65–99)
Potassium: 4.9 mmol/L (ref 3.5–5.2)
Sodium: 143 mmol/L (ref 134–144)

## 2017-04-05 NOTE — Telephone Encounter (Signed)
Error

## 2017-04-05 NOTE — Addendum Note (Signed)
Addended by: Viviana Simpler I on: 04/05/2017 12:47 PM   Modules accepted: Orders

## 2017-04-05 NOTE — Telephone Encounter (Signed)
Call placed to Pt.  Discussed Cr 2.54.  Pt has history of variable creatinine but highest on record was 2.03 in 2016.  Asked if Pt had increased his furosemide?  He stated no.  Asked if Pt had not been getting enough fluids?  Pt thought he was probably dehydrated.  Advised I would send his records to his primary care provider Dr. [name] and asked Pt to call his PCP tomorrow for possible medication adjustment.  Pt indicates understanding.

## 2017-04-05 NOTE — Telephone Encounter (Signed)
This was taking care of on 03/30/17 and I had confirmation that fax came through. I called and left Wyatt Portela a message to see if she needs it resent and advised her to call me back to my direct number-Hanya Guerin V Griffin Dewilde, RMA

## 2017-04-05 NOTE — Telephone Encounter (Signed)
Blanca calling back from [place], Dr Lucia Gaskins office to get the updated referral for bariatric surgery.  Fax: [phone]   Earnest Bailey  Phone [phone]  Pt is waiting to be scheduled.  Need asap.

## 2017-04-05 NOTE — Telephone Encounter (Signed)
Re faxed it also [place], RMA

## 2017-04-06 ENCOUNTER — Other Ambulatory Visit: Payer: Self-pay | Admitting: Internal Medicine

## 2017-04-06 ENCOUNTER — Other Ambulatory Visit: Payer: Self-pay

## 2017-04-06 ENCOUNTER — Encounter: Payer: Self-pay | Admitting: Internal Medicine

## 2017-04-06 ENCOUNTER — Ambulatory Visit (HOSPITAL_COMMUNITY): Payer: Medicare HMO | Attending: Cardiovascular Disease

## 2017-04-06 ENCOUNTER — Ambulatory Visit (INDEPENDENT_AMBULATORY_CARE_PROVIDER_SITE_OTHER): Payer: Self-pay | Admitting: *Deleted

## 2017-04-06 DIAGNOSIS — Z95810 Presence of automatic (implantable) cardiac defibrillator: Secondary | ICD-10-CM | POA: Insufficient documentation

## 2017-04-06 DIAGNOSIS — N183 Chronic kidney disease, stage 3 (moderate): Secondary | ICD-10-CM | POA: Insufficient documentation

## 2017-04-06 DIAGNOSIS — G4730 Sleep apnea, unspecified: Secondary | ICD-10-CM | POA: Insufficient documentation

## 2017-04-06 DIAGNOSIS — I4891 Unspecified atrial fibrillation: Secondary | ICD-10-CM | POA: Insufficient documentation

## 2017-04-06 DIAGNOSIS — E1121 Type 2 diabetes mellitus with diabetic nephropathy: Secondary | ICD-10-CM

## 2017-04-06 DIAGNOSIS — I5032 Chronic diastolic (congestive) heart failure: Secondary | ICD-10-CM | POA: Insufficient documentation

## 2017-04-06 DIAGNOSIS — I472 Ventricular tachycardia, unspecified: Secondary | ICD-10-CM

## 2017-04-06 DIAGNOSIS — I130 Hypertensive heart and chronic kidney disease with heart failure and stage 1 through stage 4 chronic kidney disease, or unspecified chronic kidney disease: Secondary | ICD-10-CM | POA: Insufficient documentation

## 2017-04-06 DIAGNOSIS — Z6841 Body Mass Index (BMI) 40.0 and over, adult: Secondary | ICD-10-CM | POA: Insufficient documentation

## 2017-04-06 DIAGNOSIS — E1122 Type 2 diabetes mellitus with diabetic chronic kidney disease: Secondary | ICD-10-CM | POA: Insufficient documentation

## 2017-04-06 MED ORDER — PERFLUTREN LIPID MICROSPHERE
1.0000 mL | INTRAVENOUS | Status: AC | PRN
  Administered 2017-04-06: 2 mL via INTRAVENOUS

## 2017-04-06 NOTE — Progress Notes (Signed)
Patient seen today as add on with c/o device alert tone. Patient last seen 3/18019, RRT as of 03/19/17. Alert tones turned off at 3/18 visit for RRT. Interrogation today showed Alert tone for unsuccessful carelink alert transmission. Patient reports that he has unplugged home monitor as it does not work consistently d/t poor Animal nutritionist. Care[employer] alert turned off. Gen change scheduled for 3/28

## 2017-04-09 ENCOUNTER — Other Ambulatory Visit (INDEPENDENT_AMBULATORY_CARE_PROVIDER_SITE_OTHER): Payer: Medicare HMO

## 2017-04-09 ENCOUNTER — Encounter: Payer: Self-pay | Admitting: Internal Medicine

## 2017-04-09 DIAGNOSIS — E1121 Type 2 diabetes mellitus with diabetic nephropathy: Secondary | ICD-10-CM | POA: Diagnosis not present

## 2017-04-09 LAB — RENAL FUNCTION PANEL
Albumin: 3.6 g/dL (ref 3.5–5.2)
BUN: 24 mg/dL — ABNORMAL HIGH (ref 6–23)
CO2: 27 mEq/L (ref 19–32)
CREATININE: 1.4 mg/dL (ref 0.40–1.50)
Calcium: 8.8 mg/dL (ref 8.4–10.5)
Chloride: 107 mEq/L (ref 96–112)
GFR: 55.54 mL/min — ABNORMAL LOW (ref >60.00)
Glucose, Bld: 169 mg/dL — ABNORMAL HIGH (ref 70–99)
PHOSPHORUS: 3.1 mg/dL (ref 2.3–4.6)
POTASSIUM: 4.3 meq/L (ref 3.5–5.1)
SODIUM: 141 meq/L (ref 135–145)

## 2017-04-10 ENCOUNTER — Encounter: Payer: Self-pay | Admitting: Internal Medicine

## 2017-04-11 ENCOUNTER — Encounter: Payer: Self-pay | Admitting: Internal Medicine

## 2017-04-12 ENCOUNTER — Ambulatory Visit (HOSPITAL_COMMUNITY)
Admission: RE | Admit: 2017-04-12 | Discharge: 2017-04-12 | Disposition: A | Discharge status: Home / Self Care | Payer: Medicare HMO | Source: Ambulatory Visit | Attending: Internal Medicine | Admitting: Internal Medicine

## 2017-04-12 ENCOUNTER — Encounter (HOSPITAL_COMMUNITY)
Admission: RE | Disposition: A | Discharge status: Home / Self Care | Payer: Self-pay | Source: Ambulatory Visit | Attending: Internal Medicine

## 2017-04-12 ENCOUNTER — Ambulatory Visit: Payer: Medicare HMO | Admitting: Psychiatry

## 2017-04-12 ENCOUNTER — Encounter (HOSPITAL_COMMUNITY): Payer: Self-pay | Admitting: Internal Medicine

## 2017-04-12 DIAGNOSIS — I509 Heart failure, unspecified: Secondary | ICD-10-CM | POA: Diagnosis not present

## 2017-04-12 DIAGNOSIS — E1140 Type 2 diabetes mellitus with diabetic neuropathy, unspecified: Secondary | ICD-10-CM | POA: Diagnosis not present

## 2017-04-12 DIAGNOSIS — I480 Paroxysmal atrial fibrillation: Secondary | ICD-10-CM | POA: Diagnosis not present

## 2017-04-12 DIAGNOSIS — Z794 Long term (current) use of insulin: Secondary | ICD-10-CM | POA: Diagnosis not present

## 2017-04-12 DIAGNOSIS — Z95810 Presence of automatic (implantable) cardiac defibrillator: Secondary | ICD-10-CM | POA: Diagnosis present

## 2017-04-12 DIAGNOSIS — Z79899 Other long term (current) drug therapy: Secondary | ICD-10-CM | POA: Insufficient documentation

## 2017-04-12 DIAGNOSIS — Z7901 Long term (current) use of anticoagulants: Secondary | ICD-10-CM | POA: Diagnosis not present

## 2017-04-12 DIAGNOSIS — Z85038 Personal history of other malignant neoplasm of large intestine: Secondary | ICD-10-CM | POA: Diagnosis not present

## 2017-04-12 DIAGNOSIS — Z8674 Personal history of sudden cardiac arrest: Secondary | ICD-10-CM | POA: Diagnosis not present

## 2017-04-12 DIAGNOSIS — G4730 Sleep apnea, unspecified: Secondary | ICD-10-CM | POA: Insufficient documentation

## 2017-04-12 DIAGNOSIS — E785 Hyperlipidemia, unspecified: Secondary | ICD-10-CM | POA: Diagnosis not present

## 2017-04-12 DIAGNOSIS — Z9049 Acquired absence of other specified parts of digestive tract: Secondary | ICD-10-CM | POA: Insufficient documentation

## 2017-04-12 DIAGNOSIS — Z6841 Body Mass Index (BMI) 40.0 and over, adult: Secondary | ICD-10-CM | POA: Insufficient documentation

## 2017-04-12 DIAGNOSIS — Z86718 Personal history of other venous thrombosis and embolism: Secondary | ICD-10-CM | POA: Insufficient documentation

## 2017-04-12 DIAGNOSIS — I447 Left bundle-branch block, unspecified: Secondary | ICD-10-CM | POA: Diagnosis not present

## 2017-04-12 DIAGNOSIS — Z4502 Encounter for adjustment and management of automatic implantable cardiac defibrillator: Secondary | ICD-10-CM

## 2017-04-12 DIAGNOSIS — I110 Hypertensive heart disease with heart failure: Secondary | ICD-10-CM | POA: Diagnosis not present

## 2017-04-12 DIAGNOSIS — I4892 Unspecified atrial flutter: Secondary | ICD-10-CM | POA: Insufficient documentation

## 2017-04-12 HISTORY — PX: ICD GENERATOR CHANGEOUT: EP1231

## 2017-04-12 LAB — SURGICAL PCR SCREEN
MRSA, PCR: NEGATIVE
STAPHYLOCOCCUS AUREUS: NEGATIVE

## 2017-04-12 LAB — GLUCOSE, CAPILLARY: Glucose-Capillary: 110 mg/dL — ABNORMAL HIGH (ref 65–99)

## 2017-04-12 SURGERY — ICD GENERATOR CHANGEOUT

## 2017-04-12 MED ORDER — ONDANSETRON HCL 4 MG/2ML IJ SOLN: 4.0000 mg | Freq: Four times a day (QID) | INTRAMUSCULAR | Status: DC | PRN

## 2017-04-12 MED ORDER — LIDOCAINE HCL (PF) 1 % IJ SOLN
INTRAMUSCULAR | Status: AC
  Filled 2017-04-12: qty 30

## 2017-04-12 MED ORDER — SODIUM CHLORIDE 0.9 % IR SOLN
80.0000 mg | Status: DC
  Filled 2017-04-12: qty 2

## 2017-04-12 MED ORDER — SODIUM CHLORIDE 0.9 % IV SOLN
INTRAVENOUS | Status: DC
  Administered 2017-04-12: 09:00:00 via INTRAVENOUS

## 2017-04-12 MED ORDER — SODIUM CHLORIDE 0.9 % IR SOLN
Status: AC
  Filled 2017-04-12: qty 2

## 2017-04-12 MED ORDER — MUPIROCIN 2 % EX OINT
TOPICAL_OINTMENT | CUTANEOUS | Status: AC
  Administered 2017-04-12: 1 via TOPICAL
  Filled 2017-04-12: qty 22

## 2017-04-12 MED ORDER — MUPIROCIN 2 % EX OINT
1.0000 "application " | TOPICAL_OINTMENT | Freq: Once | CUTANEOUS | Status: AC
  Administered 2017-04-12: 1 via TOPICAL

## 2017-04-12 MED ORDER — MIDAZOLAM HCL 5 MG/5ML IJ SOLN
INTRAMUSCULAR | Status: AC
  Filled 2017-04-12: qty 5

## 2017-04-12 MED ORDER — ACETAMINOPHEN 325 MG PO TABS: 325.0000 mg | ORAL_TABLET | ORAL | Status: DC | PRN

## 2017-04-12 MED ORDER — LIDOCAINE HCL (PF) 1 % IJ SOLN
INTRAMUSCULAR | Status: DC | PRN
  Administered 2017-04-12: 60 mL

## 2017-04-12 MED ORDER — CHLORHEXIDINE GLUCONATE 4 % EX LIQD
60.0000 mL | Freq: Once | CUTANEOUS | Status: DC
  Filled 2017-04-12: qty 60

## 2017-04-12 MED ORDER — DEXTROSE 5 % IV SOLN
3.0000 g | INTRAVENOUS | Status: AC
  Administered 2017-04-12: 3 g via INTRAVENOUS
  Filled 2017-04-12 (×2): qty 3000

## 2017-04-12 MED ORDER — FENTANYL CITRATE (PF) 100 MCG/2ML IJ SOLN
INTRAMUSCULAR | Status: AC
  Filled 2017-04-12: qty 2

## 2017-04-12 MED ORDER — MIDAZOLAM HCL 5 MG/5ML IJ SOLN
INTRAMUSCULAR | Status: DC | PRN
  Administered 2017-04-12 (×3): 1 mg via INTRAVENOUS

## 2017-04-12 MED ORDER — FENTANYL CITRATE (PF) 100 MCG/2ML IJ SOLN
INTRAMUSCULAR | Status: DC | PRN
  Administered 2017-04-12: 12.5 ug via INTRAVENOUS
  Administered 2017-04-12: 25 ug via INTRAVENOUS

## 2017-04-12 SURGICAL SUPPLY — 5 items
CABLE SURGICAL S-101-97-12 (CABLE) ×3 IMPLANT
HOVERMATT SINGLE USE (MISCELLANEOUS) ×3 IMPLANT
ICD VISIA MRI DVFB1D1 (ICD Generator) ×3 IMPLANT
PAD DEFIB LIFELINK (PAD) ×3 IMPLANT
TRAY PACEMAKER INSERTION (PACKS) ×3 IMPLANT

## 2017-04-12 NOTE — Discharge Instructions (Signed)

## 2017-04-12 NOTE — H&P (Signed)
ICD Criteria  Current LVEF:55%. Within 12 months prior to implant: Yes   Heart failure history: Yes, Class II  Cardiomyopathy history: No.  Atrial Fibrillation/Atrial Flutter: Yes, Paroxysmal.  Ventricular tachycardia history: No.  Cardiac arrest history: yes, VF  History of syndromes with risk of sudden death: No.  Previous ICD: Yes, Reason for ICD:  Secondary prevention.  Current ICD indication: Secondary  PPM indication: No.   Class I or II Bradycardia indication present: No  Beta Blocker therapy for 3 or more months: Yes, prescribed.   Ace Inhibitor/ARB therapy for 3 or more months: Yes, prescribed.

## 2017-04-12 NOTE — Interval H&P Note (Signed)
History and Physical Interval Note:  04/12/2017 9:18 AM  Craig Lindsey  has presented today for surgery, with the diagnosis of eri  The various methods of treatment have been discussed with the patient and family. After consideration of risks, benefits and other options for treatment, the patient has consented to  Procedure(s): ICD [place] (N/A) as a surgical intervention .  The patient's history has been reviewed, patient examined, no change in status, stable for surgery.  I have reviewed the patient's chart and labs.  Questions were answered to the patient's satisfaction.     Cristopher Peru

## 2017-04-13 MED FILL — Gentamicin Sulfate Inj 40 MG/ML: INTRAMUSCULAR | Qty: 80 | Status: AC

## 2017-04-16 ENCOUNTER — Ambulatory Visit (INDEPENDENT_AMBULATORY_CARE_PROVIDER_SITE_OTHER): Payer: Self-pay | Admitting: *Deleted

## 2017-04-16 DIAGNOSIS — I472 Ventricular tachycardia: Secondary | ICD-10-CM

## 2017-04-16 NOTE — Progress Notes (Signed)
Remote ICD transmission.   

## 2017-04-17 ENCOUNTER — Ambulatory Visit (INDEPENDENT_AMBULATORY_CARE_PROVIDER_SITE_OTHER): Payer: Medicare HMO | Admitting: Psychiatry

## 2017-04-17 DIAGNOSIS — F509 Eating disorder, unspecified: Secondary | ICD-10-CM

## 2017-04-18 ENCOUNTER — Telehealth: Payer: Self-pay | Admitting: Internal Medicine

## 2017-04-18 NOTE — Telephone Encounter (Signed)
° °  [facility] Pre-operative Risk Assessment    Request for surgical clearance:  What type of surgery is being performed? Bariatric surgery  1. When is this surgery scheduled? holding for 04/30/2017  What type of clearance is required (medical clearance vs. Pharmacy clearance to hold med vs. Both)? both 2. Are there any medications that need to be held prior to surgery and how long? xarelto  2 days prior  3. Practice name and name of physician performing surgery?  [facility] Dr.Newman   4. What is your office phone and fax number?   [phone]  Fax [phone]  5. Anesthesia type (None, local, MAC, general) ? General    Craig Lindsey 04/18/2017, 4:40 PM  _________________________________________________________________   (provider comments below)

## 2017-04-20 NOTE — Telephone Encounter (Signed)
   Primary Cardiologist: Cristopher Peru, MD  Chart reviewed as part of pre-operative protocol coverage. Given past medical history and time since last visit, based on ACC/AHA guidelines, Craig Lindsey would be at acceptable risk for the planned procedure without further cardiovascular testing.   When he saw Dr. [name] on 04/02/2017, Dr. [name] was aware that he would be having bariatric surgery soon and no further cardiac testing was needed. His ICD was at [facility] and it was replaced. He has a wound check scheduled on 04/25/2017, keep this.  This to pharmacy to address the Xarelto.  I will route this recommendation to the requesting party via Epic fax function and remove from pre-op pool.  Please call with questions.  Rosaria Ferries, PA-C 04/20/2017, 4:53 PM

## 2017-04-22 NOTE — Telephone Encounter (Signed)
Patient with diagnosis of atrial fibrillation on Xarelto for anticoagulation.    Procedure: bariatric Date of procedure: 04/30/17  CHADS2-VASc score of  5 (CHF, HTN, AGE, DM2, stroke/tia x 2 (pt has hx of DVT), )  CrCl 171.3 Platelet count 186  Per office protocol, patient can hold Xarelto for 3 days prior to procedure.    If not bridging, patient should restart Xarelto 1-2 days after, at discretion of procedure MD

## 2017-04-23 NOTE — Telephone Encounter (Signed)
Faxed to [address]

## 2017-04-24 ENCOUNTER — Ambulatory Visit: Payer: Self-pay

## 2017-04-24 DIAGNOSIS — E669 Obesity, unspecified: Secondary | ICD-10-CM | POA: Diagnosis not present

## 2017-04-24 DIAGNOSIS — K432 Incisional hernia without obstruction or gangrene: Secondary | ICD-10-CM | POA: Diagnosis not present

## 2017-04-24 DIAGNOSIS — Z7901 Long term (current) use of anticoagulants: Secondary | ICD-10-CM | POA: Diagnosis not present

## 2017-04-24 DIAGNOSIS — G4733 Obstructive sleep apnea (adult) (pediatric): Secondary | ICD-10-CM | POA: Diagnosis not present

## 2017-04-24 DIAGNOSIS — E1169 Type 2 diabetes mellitus with other specified complication: Secondary | ICD-10-CM | POA: Diagnosis not present

## 2017-04-25 ENCOUNTER — Encounter: Payer: Medicare HMO | Attending: Surgery | Admitting: Skilled Nursing Facility1

## 2017-04-25 ENCOUNTER — Encounter: Payer: Self-pay | Admitting: Skilled Nursing Facility1

## 2017-04-25 ENCOUNTER — Ambulatory Visit (INDEPENDENT_AMBULATORY_CARE_PROVIDER_SITE_OTHER): Payer: Medicare HMO | Admitting: *Deleted

## 2017-04-25 DIAGNOSIS — Z713 Dietary counseling and surveillance: Secondary | ICD-10-CM | POA: Diagnosis not present

## 2017-04-25 DIAGNOSIS — I472 Ventricular tachycardia, unspecified: Secondary | ICD-10-CM

## 2017-04-25 DIAGNOSIS — E119 Type 2 diabetes mellitus without complications: Secondary | ICD-10-CM

## 2017-04-25 NOTE — Progress Notes (Signed)
Wound check appointment. Steri-strips removed. Wound without redness or edema. Incision edges approximated, wound well healed. Normal device function. Threshold, sensing, and impedance consistent with implant measurements. Device programmed at chronic values s/p gen change. Histogram distribution appropriate for patient and level of activity. No mode switches or ventricular arrhythmias noted. Patient educated about wound care and shock plan. ROV with GT 7/1

## 2017-04-25 NOTE — Progress Notes (Signed)
RYGB  Start Wt at NDES: 467.0 Wt: 453.0 BMI: 59.77  Pt states he does all the cooking at home.  Pt states he has been Chewing more and not drinking with meals, trying to eat better, pt states he only drinks one diet soda a day.    Pt arrives having maintained weight. Pt states he has been working on mindful eating and feels he did a good job with that. Pt states he is looking forward to surgery. Pt states his sugars have been 110-125 with an A1C of 6.8. Pt states the batteries in his heart monitor were changed recently.  Dietitian reviewed pre-op and specifically the post-op liquid diet and the multivitmains to buy. Pt has a great understanding of the diet progression after surgery.  MEDICATIONS: See list   DIETARY INTAKE:  24-hr recall:  B ( AM): oatmeal/cereal, frozen fruit or protein shake (30g) or eggs, toast, and milk or bran cereal or protien shake and peanut butter crackers Snk ( AM): vegetables L ( PM): wheat wraps, chicken/turkey, swiss cheese  Snk ( PM): none D ( PM): chicken breast, salad or salmon, vegetables and pickled beets  Snk ( PM): sometimes cheese its  Beverages: 2% milk, Pepsi zero, G2, water, tomato juice  Usual physical activity: ADLs (walking about 2000 steps a day), arm exercises 10 min 3 days/week walking, leg lifts about 3000 steps/day, resistance bands   Diet to Follow: Calories: 1800 Carbohydrate: 200 Protein: 135 Fat: 50  Nutritional Diagnosis:  [place]-3.3 Overweight/obesity related to past poor dietary habits and physical inactivity as evidenced by patient w/ upcoming RYGB surgery following dietary guidelines for continued weight loss.    Intervention:  Nutrition counseling for upcoming Bariatric Surgery.  Goals: -Buy all the different liquids and protein supplements for 2 weeks after surgery  Teaching Method Utilized:  Visual Auditory  Handouts given during visit include:  none  Barriers to learning/adherence to lifestyle change:  none  Demonstrated degree of understanding via:  Teach Back   Monitoring/Evaluation:  Dietary intake, exercise, and body weight

## 2017-04-26 ENCOUNTER — Ambulatory Visit: Payer: Self-pay | Admitting: Surgery

## 2017-04-26 NOTE — Patient Instructions (Addendum)
Craig Lindsey  04/26/2017   Your procedure is scheduled on:04-27-17    Report to [place]  Entrance              Report to admitting at       0930 AM    Call this number if you have problems the morning of surgery [phone]    Remember:    MORNING OF SURGERY DRINK:  [place], DRINK ALL OF THE SHAKE AT ONE TIME.   NO SOLID FOOD AFTER 600 PM THE NIGHT BEFORE YOUR SURGERY. YOU MAY DRINK CLEAR FLUIDS. THE SHAKE YOU DRINK BEFORE YOU LEAVE HOME WILL BE THE LAST FLUIDS YOU DRINK BEFORE SURGERY.  PAIN IS EXPECTED AFTER SURGERY AND WILL NOT BE COMPLETELY ELIMINATED. AMBULATION AND TYLENOL WILL HELP REDUCE INCISIONAL AND GAS PAIN. MOVEMENT IS KEY!  YOU ARE EXPECTED TO BE OUT OF BED WITHIN 4 HOURS OF ADMISSION TO YOUR PATIENT ROOM.  SITTING IN THE RECLINER THROUGHOUT THE DAY IS IMPORTANT FOR DRINKING FLUIDS AND MOVING GAS THROUGHOUT THE GI TRACT.  COMPRESSION STOCKINGS SHOULD BE WORN [place] UNLESS YOU ARE WALKING.   INCENTIVE SPIROMETER SHOULD BE USED EVERY HOUR WHILE AWAKE TO DECREASE POST-OPERATIVE COMPLICATIONS SUCH AS PNEUMONIA.  WHEN DISCHARGED HOME, IT IS IMPORTANT TO CONTINUE TO WALK EVERY HOUR AND USE THE INCENTIVE SPIROMETER EVERY HOUR.         Take these medicines the morning of surgery with A SIP OF WATER: metoprolol, hydralazine, diltiazem, amiodarone  DO NOT TAKE ANY DIABETIC MEDICATIONS DAY OF YOUR SURGERY                               You may not have any metal on your body including hair pins and              piercings  Do not wear jewelry,  lotions, powders or perfumes, deodorant                 Men may shave face and neck.   Do not bring valuables to the hospital. [place].  Contacts, dentures or bridgework may not be worn into surgery.  Leave suitcase in the car. After surgery it may be brought to your room.                 Please read  over the following fact sheets you were given: _____________________________________________________________________           [facility] - Preparing for Surgery Before surgery, you can play an important role.  Because skin is not sterile, your skin needs to be as free of germs as possible.  You can reduce the number of germs on your skin by washing with CHG (chlorahexidine gluconate) soap before surgery.  CHG is an antiseptic cleaner which kills germs and bonds with the skin to continue killing germs even after washing. Please DO NOT use if you have an allergy to CHG or antibacterial soaps.  If your skin becomes reddened/irritated stop using the CHG and inform your nurse when you arrive at Short Stay. Do not shave (including legs and underarms) for at least 48 hours prior to the first CHG shower.  You may shave your  face/neck. Please follow these instructions carefully:  1.  Shower with CHG Soap the night before surgery and the  morning of Surgery.  2.  If you choose to wash your hair, wash your hair first as usual with your  normal  shampoo.  3.  After you shampoo, rinse your hair and body thoroughly to remove the  shampoo.                           4.  Use CHG as you would any other liquid soap.  You can apply chg directly  to the skin and wash                       Gently with a scrungie or clean washcloth.  5.  Apply the CHG Soap to your body ONLY FROM THE NECK DOWN.   Do not use on face/ open                           Wound or open sores. Avoid contact with eyes, ears mouth and genitals (private parts).                       Wash face,  Genitals (private parts) with your normal soap.             6.  Wash thoroughly, paying special attention to the area where your surgery  will be performed.  7.  Thoroughly rinse your body with warm water from the neck down.  8.  DO NOT shower/wash with your normal soap after using and rinsing off  the CHG Soap.                9.  Pat yourself dry with a  clean towel.            10.  Wear clean pajamas.            11.  Place clean sheets on your bed the night of your first shower and do not  sleep with pets. Day of Surgery : Do not apply any lotions/deodorants the morning of surgery.  Please wear clean clothes to the hospital/surgery center.  FAILURE TO FOLLOW THESE INSTRUCTIONS MAY RESULT IN THE CANCELLATION OF YOUR SURGERY PATIENT SIGNATURE_________________________________  NURSE SIGNATURE__________________________________  ________________________________________________________________________ How to Manage Your Diabetes Before and After Surgery  Why is it important to control my blood sugar before and after surgery? . Improving blood sugar levels before and after surgery helps healing and can limit problems. . A way of improving blood sugar control is eating a healthy diet by: o  Eating less sugar and carbohydrates o  Increasing activity/exercise o  Talking with your doctor about reaching your blood sugar goals . High blood sugars (greater than 180 mg/dL) can raise your risk of infections and slow your recovery, so you will need to focus on controlling your diabetes during the weeks before surgery. . Make sure that the doctor who takes care of your diabetes knows about your planned surgery including the date and location.  How do I manage my blood sugar before surgery? . Check your blood sugar at least 4 times a day, starting 2 days before surgery, to make sure that the level is not too high or low. o Check your blood sugar the morning of your surgery when you wake up and every 2 hours until  you get to the Short Stay unit. . If your blood sugar is less than 70 mg/dL, you will need to treat for low blood sugar: o Do not take insulin. o Treat a low blood sugar (less than 70 mg/dL) with  cup of clear juice (cranberry or apple), 4 glucose tablets, OR glucose gel. o Recheck blood sugar in 15 minutes after treatment (to make sure it is  greater than 70 mg/dL). If your blood sugar is not greater than 70 mg/dL on recheck, call [phone] for further instructions. . Report your blood sugar to the short stay nurse when you get to Short Stay.  . If you are admitted to the hospital after surgery: o Your blood sugar will be checked by the staff and you will probably be given insulin after surgery (instead of oral diabetes medicines) to make sure you have good blood sugar levels. o The goal for blood sugar control after surgery is 80-180 mg/dL.   WHAT DO I DO ABOUT MY DIABETES MEDICATION?  Mar[place] Kitchen Do not take oral diabetes medicines (pills) the morning of surgery.  . THE NIGHT BEFORE SURGERY, take  70 %  Of your Novolin 70/30  Insulin dose.      . THE MORNING OF SURGERY, take  0 units of         insulin.  . The day of surgery, do not take other diabetes injectables, including Byetta (exenatide), Bydureon (exenatide ER), Victoza (liraglutide), or Trulicity (dulaglutide).  . If your CBG is greater than 220 mg/dL, you may take  of your sliding scale  .  . (correction) dose of insulin. Patient Signature:  Date:   Nurse Signature:  Date:   Reviewed and Endorsed by [facility] Patient Education Committee, August 2015  Incentive Spirometer  An incentive spirometer is a tool that can help keep your lungs clear and active. This tool measures how well you are filling your lungs with each breath. Taking long deep breaths may help reverse or decrease the chance of developing breathing (pulmonary) problems (especially infection) following:  A long period of time when you are unable to move or be active. BEFORE THE PROCEDURE   If the spirometer includes an indicator to show your best effort, your nurse or respiratory therapist will set it to a desired goal.  If possible, sit up straight or lean slightly forward. Try not to slouch.  Hold the incentive spirometer in an upright position. INSTRUCTIONS FOR USE  1. Sit on the edge of your  bed if possible, or sit up as far as you can in bed or on a chair. 2. Hold the incentive spirometer in an upright position. 3. Breathe out normally. 4. Place the mouthpiece in your mouth and seal your lips tightly around it. 5. Breathe in slowly and as deeply as possible, raising the piston or the ball toward the top of the column. 6. Hold your breath for 3-5 seconds or for as long as possible. Allow the piston or ball to fall to the bottom of the column. 7. Remove the mouthpiece from your mouth and breathe out normally. 8. Rest for a few seconds and repeat Steps 1 through 7 at least 10 times every 1-2 hours when you are awake. Take your time and take a few normal breaths between deep breaths. 9. The spirometer may include an indicator to show your best effort. Use the indicator as a goal to work toward during each repetition. 10. After each set of 10 deep breaths, practice  coughing to be sure your lungs are clear. If you have an incision (the cut made at the time of surgery), support your incision when coughing by placing a pillow or rolled up towels firmly against it. Once you are able to get out of bed, walk around indoors and cough well. You may stop using the incentive spirometer when instructed by your caregiver.  RISKS AND COMPLICATIONS  Take your time so you do not get dizzy or light-headed.  If you are in pain, you may need to take or ask for pain medication before doing incentive spirometry. It is harder to take a deep breath if you are having pain. AFTER USE  Rest and breathe slowly and easily.  It can be helpful to keep track of a log of your progress. Your caregiver can provide you with a simple table to help with this. If you are using the spirometer at home, follow these instructions: [place] IF:   You are having difficultly using the spirometer.  You have trouble using the spirometer as often as instructed.  Your pain medication is not giving enough relief while  using the spirometer.  You develop fever of 100.5 F (38.1 C) or higher. SEEK IMMEDIATE MEDICAL CARE IF:   You cough up bloody sputum that had not been present before.  You develop fever of 102 F (38.9 C) or greater.  You develop worsening pain at or near the incision site. MAKE SURE YOU:   Understand these instructions.  Will watch your condition.  Will get help right away if you are not doing well or get worse. Document Released: 05/15/2006 Document Revised: 03/27/2011 Document Reviewed: 07/16/2006 [facility] Patient Information 2014 [place], [place].   ________________________________________________________________________

## 2017-04-27 ENCOUNTER — Encounter (HOSPITAL_COMMUNITY)
Admission: RE | Admit: 2017-04-27 | Discharge: 2017-04-27 | Disposition: A | Discharge status: Home / Self Care | Payer: Medicare HMO | Source: Ambulatory Visit | Attending: Surgery | Admitting: Surgery

## 2017-04-27 ENCOUNTER — Other Ambulatory Visit: Payer: Self-pay

## 2017-04-27 ENCOUNTER — Encounter (HOSPITAL_COMMUNITY): Payer: Self-pay

## 2017-04-27 DIAGNOSIS — Z79899 Other long term (current) drug therapy: Secondary | ICD-10-CM | POA: Diagnosis not present

## 2017-04-27 DIAGNOSIS — Z6841 Body Mass Index (BMI) 40.0 and over, adult: Secondary | ICD-10-CM | POA: Diagnosis not present

## 2017-04-27 DIAGNOSIS — E785 Hyperlipidemia, unspecified: Secondary | ICD-10-CM | POA: Diagnosis not present

## 2017-04-27 DIAGNOSIS — I10 Essential (primary) hypertension: Secondary | ICD-10-CM | POA: Diagnosis present

## 2017-04-27 DIAGNOSIS — Z8679 Personal history of other diseases of the circulatory system: Secondary | ICD-10-CM | POA: Diagnosis not present

## 2017-04-27 DIAGNOSIS — E119 Type 2 diabetes mellitus without complications: Secondary | ICD-10-CM | POA: Diagnosis present

## 2017-04-27 DIAGNOSIS — G4733 Obstructive sleep apnea (adult) (pediatric): Secondary | ICD-10-CM | POA: Diagnosis present

## 2017-04-27 DIAGNOSIS — Z7901 Long term (current) use of anticoagulants: Secondary | ICD-10-CM | POA: Diagnosis not present

## 2017-04-27 DIAGNOSIS — Z95810 Presence of automatic (implantable) cardiac defibrillator: Secondary | ICD-10-CM | POA: Diagnosis not present

## 2017-04-27 DIAGNOSIS — K432 Incisional hernia without obstruction or gangrene: Secondary | ICD-10-CM | POA: Diagnosis present

## 2017-04-27 DIAGNOSIS — N281 Cyst of kidney, acquired: Secondary | ICD-10-CM | POA: Diagnosis present

## 2017-04-27 DIAGNOSIS — E1140 Type 2 diabetes mellitus with diabetic neuropathy, unspecified: Secondary | ICD-10-CM | POA: Diagnosis not present

## 2017-04-27 DIAGNOSIS — Z794 Long term (current) use of insulin: Secondary | ICD-10-CM | POA: Diagnosis not present

## 2017-04-27 DIAGNOSIS — I4891 Unspecified atrial fibrillation: Secondary | ICD-10-CM | POA: Diagnosis not present

## 2017-04-27 DIAGNOSIS — Z9049 Acquired absence of other specified parts of digestive tract: Secondary | ICD-10-CM | POA: Diagnosis not present

## 2017-04-27 HISTORY — DX: Unspecified osteoarthritis, unspecified site: M19.90

## 2017-04-27 HISTORY — DX: Cardiac arrhythmia, unspecified: I49.9

## 2017-04-27 LAB — ABO/RH: ABO/RH(D): B NEG

## 2017-04-27 LAB — GLUCOSE, CAPILLARY: GLUCOSE-CAPILLARY: 199 mg/dL — AB (ref 65–99)

## 2017-04-27 LAB — COMPREHENSIVE METABOLIC PANEL
ALBUMIN: 3.6 g/dL (ref 3.5–5.0)
ALK PHOS: 72 U/L (ref 38–126)
ALT: 28 U/L (ref 17–63)
ANION GAP: 9 (ref 5–15)
AST: 21 U/L (ref 15–41)
BUN: 30 mg/dL — AB (ref 6–20)
CALCIUM: 8.9 mg/dL (ref 8.9–10.3)
CO2: 24 mmol/L (ref 22–32)
Chloride: 108 mmol/L (ref 101–111)
Creatinine, Ser: 1.42 mg/dL — ABNORMAL HIGH (ref 0.61–1.24)
GFR calc Af Amer: >60 mL/min (ref >60)
GFR calc non Af Amer: 54 mL/min — ABNORMAL LOW (ref >60)
GLUCOSE: 176 mg/dL — AB (ref 65–99)
Potassium: 4.7 mmol/L (ref 3.5–5.1)
SODIUM: 141 mmol/L (ref 135–145)
Total Bilirubin: 0.9 mg/dL (ref 0.3–1.2)
Total Protein: 7.3 g/dL (ref 6.5–8.1)

## 2017-04-27 LAB — CBC WITH DIFFERENTIAL/PLATELET
BASOS ABS: 0 10*3/uL (ref 0.0–0.1)
BASOS PCT: 0 %
EOS ABS: 0.1 10*3/uL (ref 0.0–0.7)
Eosinophils Relative: 1 %
HCT: 49.4 % (ref 39.0–52.0)
HEMOGLOBIN: 16 g/dL (ref 13.0–17.0)
Lymphocytes Relative: 13 %
Lymphs Abs: 1.5 10*3/uL (ref 0.7–4.0)
MCH: 30.9 pg (ref 26.0–34.0)
MCHC: 32.4 g/dL (ref 30.0–36.0)
MCV: 95.6 fL (ref 78.0–100.0)
MONOS PCT: 7 %
Monocytes Absolute: 0.8 10*3/uL (ref 0.1–1.0)
NEUTROS ABS: 9 10*3/uL — AB (ref 1.7–7.7)
NEUTROS PCT: 79 %
Platelets: 134 10*3/uL — ABNORMAL LOW (ref 150–400)
RBC: 5.17 MIL/uL (ref 4.22–5.81)
RDW: 14.3 % (ref 11.5–15.5)
WBC: 11.4 10*3/uL — AB (ref 4.0–10.5)

## 2017-04-27 LAB — SURGICAL PCR SCREEN
MRSA, PCR: NEGATIVE
STAPHYLOCOCCUS AUREUS: NEGATIVE

## 2017-04-27 NOTE — Progress Notes (Signed)
Cbc/ diff and cmp done 04-27-17 routed to Dr. Lucia Gaskins via epic

## 2017-04-27 NOTE — Progress Notes (Signed)
Clearance 04-18-17 Craig Lindsey chart, ekg 04-02-17 epic, hgba1c 03-05-17 epic Last device check 04-03-17 epic  Echo 04-06-17 epic

## 2017-04-29 NOTE — H&P (Signed)
Craig Lindsey Documented: 04/24/2017 7:51 AM Location: [place] [facility] Patient #: [id number] DOB: 10-03-1960 Married / Language: Craig Lindsey / Race: White Male   History of Present Illness Craig Lindsey H. Lucia Gaskins MD; 04/24/2017 6:38 PM) The patient is a 57 year old male who presents with a complaint of hernia.  The PCP is Craig Lindsey He comes with his wife.  He has been working hard to loose weight, but he has slowed down. If I have this right, he has been on the preop Atkins diet since about January. His weight has leveled off. In part this may be because of his tweaked knee. The lack of weight loss may also be because he has completed a cardiac evaluation we've asked him to do, which includes an echo and replacement of his battery for his AICD. I reviewed with him hospital course, the length of operation, the risk operation, postoperative recovery, and long-term follow-up. I think he has a good handle on all the laser had a him. He says he never gets nauseated, which hopefully is optimistic for his sleeve resection.  Weight - 03/29/2011 - 433 02/25/2016 - 467 11/10/2016 - 471 03/19/2017 - 453 05/04/2017 - 452  UGI - 03/08/2016 - normal Psych - saw Craig Lindsey - 01/04/2017  Plan: 1) AICD battery was changed 04/12/2017 (this is Medtronic). His echo was 3/22 and was good, 2) He has been on a pre op Atkins kind of diet since January - though his weight has stalled the last 1 month, 3) We need to contact nutrition about post op visit and any further pre op follow up, 4) Stop Xarelto after tomorrow if surgery next week, 5) Vitamins and pre op meds - oxycodone, protonix, and zofran, 6) Needs pre op visit with anesthesia and plans to manage AICD, 7) Planning sleeve gastrectomy - he has a hold date of 04/30/2017  History of obesity: He went to an information session several weeks ago. I sounds like Dr. Redmond Pulling was the presenter. He  has tried multiple diets including: Herbal life, Nutrisystem's, low-calorie diet, and counting calories. He tried Fen/Phen when it was available. None of these weight loss measures have brought him success in losing weight. He is interested in a sleeve gastrectomy, which I think with his weight and girth is the right operation.  Per the [facility], the patient is a candidate for bariatric surgery. The patient attended our initial information session and reviewed the types of bariatric surgery. The patient was originally interested in the gastric bypass. Though we talked with his size and previous bowel surgery, a bypass may be very difficult and a gastric sleeve may be a better initial option. I discussed with the patient the indications and risks of bariatric surgery. The potential risks of surgery include, but are not limited to, bleeding, infection, leak from the bowel, DVT and PE, open surgery, long term nutrition consequences, and death. The patient understands the importance of compliance and long term follow-up with our group after surgery.  I also have been stressing for him to get his weight down pre op.  He did well for a while, then stalled.  Past Medical History: 1. Ventral incisional hernia Upper incision to the right.  We have talked about repairing this when his weight is 100 pounds below his pre op weight.  2. Morbid obesity. Has gained weight since I last saw him. 3. Sigmoid colon resection for adenoca, T3, N1 (1/12 nodes) - 02/13/2007 - [facility] - Craig Lindsey  last colonoscopy 2015 was negative. Oncology - Craig Lindsey. He did get chemotx. Disease free. 4. History of ventricular fibrillation - Followed by Dr. Beckie Lindsey 5. AICD in place. Originally placed in 2004. He said that it has never gone off. Replaced in 2019. 6. On Xarelto 7. Diabetes Mellitus.  Now  on insulin x 1 1/2 year. Sees Dr. [name] for this. 8. Sleep apnea. On CPAP 9. Complex left renal cyst. 10. He is on Chronic disability for his legs and knees since 2010. 11. Lap chole - 11/24/2005 - [facility] 12. CKD - saw Dr. Anthonette Legato in August 2017 13. Psych - saw Craig Lindsey - 01/04/2017  Social History: Married. He has been on disability since 2010 He has step children. Chronic disability for his legs and knees since 2010.  His daughter gets married 05/26/2017. He lives in [place] - so it is easier for him to come to the [place] office - we can try to accomodate him.   Allergies (Craig Lindsey, CMA; 04/24/2017 1:26 PM) No Known Drug Allergies [02/25/2016]:  Medication History (Craig Lindsey, CMA; 04/24/2017 1:26 PM) Xarelto (20MG  Tablet, Oral) Active. Amiodarone HCl (200MG  Tablet, Oral) Active. Metoprolol Succinate ER (100MG  Tablet ER 24HR, Oral) Active. Furosemide (40MG  Tablet, Oral) Active. Accu-Chek SmartView (In Vitro) Active. Atorvastatin Calcium (10MG  Tablet, Oral daily) Active. BD Pen Needle Ultrafine (29G X 12.7MM Misc, daily) Active. GlipiZIDE XL (10MG  Tablet ER 24HR, Oral daily) Active. Lisinopril (40MG  Tablet, Oral daily) Active. HydrALAZINE HCl (25MG  Tablet, Oral daily) Active. NovoLOG Mix 70/30 FlexPen ((70-30) 100UNIT/ML Susp Pen-inj, Subcutaneous daily) Active. Dilt-XR (240MG  Capsule ER 24HR, Oral daily) Active. Medications Reconciled  Vitals (Craig Lindsey CMA; 04/24/2017 1:27 PM) 04/24/2017 1:27 PM Weight: 452.5 lb Height: 76in Body Surface Area: 3.13 m Body Mass Index: 55.08 kg/m  Temp.: 49F(Oral)  Pulse: 70 (Regular)  BP: 140/62 (Sitting, Left Arm, Standard)   Physical Exam  General: Very obese WM alert and generally healthy appearing. Skin: Inspection and palpation of the skin unremarkable.  Eyes: Conjunctivae white, pupils equal. Face, ears, nose, mouth, and throat: Face - normal.  Normal ears and nose. Lips and teeth normal.  Neck: Supple. No mass. Trachea midline. No thyroid mass.  Lymph Nodes: No supraclavicular or cervical adenopathy. No axillary adenopathy.  Lungs: Normal respiratory effort. Clear to auscultation and symmetric breath sounds. Cardiovascular: Regular rate and rythm. Normal auscultation of the heart. No murmur or rub.  AICD in left upper chest  Abdomen: Massive abdomen. Liver and spleen not palpable. No tenderness. Normal bowel sounds.  Ventral hernia in upper aspect of prior midline wound. Long midline scar from colon surgery. He has a hernia to right of mid line which his opposite the umbilicus. He is much more apple than pear. Rectal: Not done.  Musculoskeletal/extremities: Normal gait. Good strength and ROM in upper and lower extremities.   Neurologic: Grossly intact to motor and sensory function.  No obvious deficit in the cranial nerves. Psychiatric: Has normal mood and affect. Judgement and insight appear normal.  Assessment & Plan  1.  MORBID OBESITY (E66.01)  Plan:   1) AICD battery was changed 04/12/2017. His echo was 3/22 and was good  2) He has been on a pre op Atkins kind of diet since January - though his weight has stalled the last 1 month   3) We need to contact nutrition about post op visit and any further pre op follow up  4) Stop Xarelto after tomorrow if surgery next week   5) Vitamins and  pre op meds - oxycodone, protonix, and zofran  6) Needs pre op visit with anesthesia and plans to manage AICD   7) Planning sleeve gastrectomy - on 04/30/2017  2.  DIABETES MELLITUS TYPE 2 IN OBESE (E11.69) Now on insulin x 1 1/2 year. Sees Dr. [name] for this. 3.  OBSTRUCTIVE SLEEP APNEA (G47.33)  On CPAP 4.  VENTRAL INCISIONAL HERNIA (K43.2) Upper incision to the right.   We have talked about repairing this when his weight is 100  pounds below his pre op weight. 5.  ANTICOAGULATED (Z79.01)  Impression: ON xarelto  6. Sigmoid colon resection for adenoca, T3, N1 (1/12 nodes) - 02/13/2007 - D. Darle[facility] Craig Lindsey last colonoscopy 2015 was negative. Oncology - Craig Lindsey. He did get chemotx. Disease free. 7. History of ventricular fibrillation - Followed by Dr. Beckie Lindsey 8. AICD in place. Originally placed in 2004. He said that it has never gone off. Replaced in 2019.  9. Complex left renal cyst. 10. He is on Chronic disability for his legs and knees since 2010. 11. CKD - saw Dr. Anthonette Legato in August 2017   Craig Overall, MD, [facility] Pager: [phone] Office phone:  [phone]

## 2017-04-30 ENCOUNTER — Inpatient Hospital Stay (HOSPITAL_COMMUNITY): Payer: Medicare HMO | Admitting: Anesthesiology

## 2017-04-30 ENCOUNTER — Encounter (HOSPITAL_COMMUNITY)
Admission: RE | Disposition: A | Discharge status: Home / Self Care | Payer: Self-pay | Source: Ambulatory Visit | Attending: Surgery

## 2017-04-30 ENCOUNTER — Encounter (HOSPITAL_COMMUNITY): Payer: Self-pay | Admitting: *Deleted

## 2017-04-30 ENCOUNTER — Other Ambulatory Visit: Payer: Self-pay

## 2017-04-30 ENCOUNTER — Inpatient Hospital Stay (HOSPITAL_COMMUNITY)
Admission: RE | Admit: 2017-04-30 | Discharge: 2017-05-01 | DRG: 621 | Disposition: A | Discharge status: Home / Self Care | Payer: Medicare HMO | Source: Ambulatory Visit | Attending: Surgery | Admitting: Surgery

## 2017-04-30 DIAGNOSIS — Z9049 Acquired absence of other specified parts of digestive tract: Secondary | ICD-10-CM

## 2017-04-30 DIAGNOSIS — E119 Type 2 diabetes mellitus without complications: Secondary | ICD-10-CM | POA: Diagnosis present

## 2017-04-30 DIAGNOSIS — N281 Cyst of kidney, acquired: Secondary | ICD-10-CM | POA: Diagnosis present

## 2017-04-30 DIAGNOSIS — Z8679 Personal history of other diseases of the circulatory system: Secondary | ICD-10-CM

## 2017-04-30 DIAGNOSIS — Z6841 Body Mass Index (BMI) 40.0 and over, adult: Secondary | ICD-10-CM

## 2017-04-30 DIAGNOSIS — G4733 Obstructive sleep apnea (adult) (pediatric): Secondary | ICD-10-CM | POA: Diagnosis present

## 2017-04-30 DIAGNOSIS — Z7901 Long term (current) use of anticoagulants: Secondary | ICD-10-CM

## 2017-04-30 DIAGNOSIS — Z95810 Presence of automatic (implantable) cardiac defibrillator: Secondary | ICD-10-CM

## 2017-04-30 DIAGNOSIS — K432 Incisional hernia without obstruction or gangrene: Secondary | ICD-10-CM | POA: Diagnosis present

## 2017-04-30 DIAGNOSIS — I10 Essential (primary) hypertension: Secondary | ICD-10-CM | POA: Diagnosis present

## 2017-04-30 DIAGNOSIS — Z79899 Other long term (current) drug therapy: Secondary | ICD-10-CM | POA: Diagnosis not present

## 2017-04-30 DIAGNOSIS — Z794 Long term (current) use of insulin: Secondary | ICD-10-CM | POA: Diagnosis not present

## 2017-04-30 HISTORY — PX: LAPAROSCOPIC GASTRIC SLEEVE RESECTION: SHX5895

## 2017-04-30 LAB — TYPE AND SCREEN
ABO/RH(D): B NEG
Antibody Screen: NEGATIVE

## 2017-04-30 LAB — GLUCOSE, CAPILLARY
GLUCOSE-CAPILLARY: 180 mg/dL — AB (ref 65–99)
Glucose-Capillary: 235 mg/dL — ABNORMAL HIGH (ref 65–99)
Glucose-Capillary: 160 mg/dL — ABNORMAL HIGH (ref 65–99)
Glucose-Capillary: 162 mg/dL — ABNORMAL HIGH (ref 65–99)
Glucose-Capillary: 179 mg/dL — ABNORMAL HIGH (ref 65–99)

## 2017-04-30 LAB — HEMOGLOBIN AND HEMATOCRIT, BLOOD
HCT: 46.2 % (ref 39.0–52.0)
Hemoglobin: 15.4 g/dL (ref 13.0–17.0)

## 2017-04-30 SURGERY — GASTRECTOMY, SLEEVE, LAPAROSCOPIC
Anesthesia: General

## 2017-04-30 MED ORDER — OXYCODONE HCL 5 MG PO TABS: 5.0000 mg | ORAL_TABLET | Freq: Once | ORAL | Status: DC | PRN

## 2017-04-30 MED ORDER — MORPHINE SULFATE (PF) 4 MG/ML IV SOLN
1.0000 mg | INTRAVENOUS | Status: DC | PRN
  Administered 2017-04-30: 2 mg via INTRAVENOUS
  Filled 2017-04-30: qty 1

## 2017-04-30 MED ORDER — CHLORHEXIDINE GLUCONATE 4 % EX LIQD: 60.0000 mL | Freq: Once | CUTANEOUS | Status: DC

## 2017-04-30 MED ORDER — HEPARIN SODIUM (PORCINE) 5000 UNIT/ML IJ SOLN
5000.0000 [IU] | Freq: Three times a day (TID) | INTRAMUSCULAR | Status: DC
  Administered 2017-04-30 – 2017-05-01 (×2): 5000 [IU] via SUBCUTANEOUS
  Filled 2017-04-30 (×2): qty 1

## 2017-04-30 MED ORDER — METOPROLOL SUCCINATE ER 25 MG PO TB24
200.0000 mg | ORAL_TABLET | Freq: Every day | ORAL | Status: DC
  Administered 2017-05-01: 200 mg via ORAL
  Filled 2017-04-30: qty 8

## 2017-04-30 MED ORDER — BUPIVACAINE HCL (PF) 0.25 % IJ SOLN
INTRAMUSCULAR | Status: AC
  Filled 2017-04-30: qty 30

## 2017-04-30 MED ORDER — KCL IN DEXTROSE-NACL 20-5-0.45 MEQ/L-%-% IV SOLN
INTRAVENOUS | Status: DC
  Administered 2017-04-30: 18:00:00 via INTRAVENOUS
  Filled 2017-04-30 (×2): qty 1000

## 2017-04-30 MED ORDER — PROPOFOL 10 MG/ML IV BOLUS
INTRAVENOUS | Status: AC
  Filled 2017-04-30: qty 40

## 2017-04-30 MED ORDER — BUPIVACAINE LIPOSOME 1.3 % IJ SUSP
20.0000 mL | Freq: Once | INTRAMUSCULAR | Status: AC
  Administered 2017-04-30: 20 mL
  Filled 2017-04-30: qty 20

## 2017-04-30 MED ORDER — PREMIER PROTEIN SHAKE
2.0000 [oz_av] | ORAL | Status: DC
  Administered 2017-05-01 (×3): 2 [oz_av] via ORAL
  Filled 2017-04-30 (×7): qty 325.31

## 2017-04-30 MED ORDER — PROPOFOL 10 MG/ML IV BOLUS
INTRAVENOUS | Status: DC | PRN
  Administered 2017-04-30: 300 mg via INTRAVENOUS

## 2017-04-30 MED ORDER — ROCURONIUM BROMIDE 10 MG/ML (PF) SYRINGE
PREFILLED_SYRINGE | INTRAVENOUS | Status: AC
  Filled 2017-04-30: qty 5

## 2017-04-30 MED ORDER — ONDANSETRON HCL 4 MG/2ML IJ SOLN: 4.0000 mg | INTRAMUSCULAR | Status: DC | PRN

## 2017-04-30 MED ORDER — LIDOCAINE 2% (20 MG/ML) 5 ML SYRINGE
INTRAMUSCULAR | Status: AC
  Filled 2017-04-30: qty 5

## 2017-04-30 MED ORDER — SUCCINYLCHOLINE CHLORIDE 200 MG/10ML IV SOSY
PREFILLED_SYRINGE | INTRAVENOUS | Status: DC | PRN
  Administered 2017-04-30: 160 mg via INTRAVENOUS

## 2017-04-30 MED ORDER — MIDAZOLAM HCL 5 MG/5ML IJ SOLN
INTRAMUSCULAR | Status: DC | PRN
  Administered 2017-04-30: 2 mg via INTRAVENOUS

## 2017-04-30 MED ORDER — SUGAMMADEX SODIUM 500 MG/5ML IV SOLN
INTRAVENOUS | Status: DC | PRN
  Administered 2017-04-30: 500 mg via INTRAVENOUS

## 2017-04-30 MED ORDER — ONDANSETRON HCL 4 MG/2ML IJ SOLN: 4.0000 mg | Freq: Once | INTRAMUSCULAR | Status: DC | PRN

## 2017-04-30 MED ORDER — FAMOTIDINE IN NACL 20-0.9 MG/50ML-% IV SOLN
20.0000 mg | Freq: Two times a day (BID) | INTRAVENOUS | Status: DC
  Administered 2017-04-30 – 2017-05-01 (×2): 20 mg via INTRAVENOUS
  Filled 2017-04-30 (×2): qty 50

## 2017-04-30 MED ORDER — AMIODARONE HCL 200 MG PO TABS
200.0000 mg | ORAL_TABLET | Freq: Every day | ORAL | Status: DC
  Administered 2017-05-01: 200 mg via ORAL
  Filled 2017-04-30: qty 1

## 2017-04-30 MED ORDER — ACETAMINOPHEN 160 MG/5ML PO SOLN
650.0000 mg | Freq: Four times a day (QID) | ORAL | Status: DC
  Administered 2017-04-30 – 2017-05-01 (×3): 650 mg via ORAL
  Filled 2017-04-30 (×3): qty 20.3

## 2017-04-30 MED ORDER — APREPITANT 40 MG PO CAPS
40.0000 mg | ORAL_CAPSULE | ORAL | Status: AC
  Administered 2017-04-30: 40 mg via ORAL
  Filled 2017-04-30: qty 1

## 2017-04-30 MED ORDER — HEPARIN SODIUM (PORCINE) 5000 UNIT/ML IJ SOLN
5000.0000 [IU] | INTRAMUSCULAR | Status: AC
  Administered 2017-04-30: 5000 [IU] via SUBCUTANEOUS
  Filled 2017-04-30: qty 1

## 2017-04-30 MED ORDER — INSULIN ASPART 100 UNIT/ML ~~LOC~~ SOLN
0.0000 [IU] | SUBCUTANEOUS | Status: DC
  Administered 2017-04-30 – 2017-05-01 (×6): 4 [IU] via SUBCUTANEOUS

## 2017-04-30 MED ORDER — LIDOCAINE 2% (20 MG/ML) 5 ML SYRINGE
INTRAMUSCULAR | Status: DC | PRN
  Administered 2017-04-30: 100 mg via INTRAVENOUS

## 2017-04-30 MED ORDER — LACTATED RINGERS IR SOLN
Status: DC | PRN
  Administered 2017-04-30: 1000 mL

## 2017-04-30 MED ORDER — FENTANYL CITRATE (PF) 100 MCG/2ML IJ SOLN
INTRAMUSCULAR | Status: AC
  Filled 2017-04-30: qty 2

## 2017-04-30 MED ORDER — EPHEDRINE SULFATE-NACL 50-0.9 MG/10ML-% IV SOSY
PREFILLED_SYRINGE | INTRAVENOUS | Status: DC | PRN
  Administered 2017-04-30: 10 mg via INTRAVENOUS
  Administered 2017-04-30: 15 mg via INTRAVENOUS
  Administered 2017-04-30: 10 mg via INTRAVENOUS
  Administered 2017-04-30: 15 mg via INTRAVENOUS

## 2017-04-30 MED ORDER — BUPIVACAINE HCL 0.25 % IJ SOLN
INTRAMUSCULAR | Status: DC | PRN
  Administered 2017-04-30: 30 mL

## 2017-04-30 MED ORDER — MIDAZOLAM HCL 2 MG/2ML IJ SOLN
INTRAMUSCULAR | Status: AC
  Filled 2017-04-30: qty 2

## 2017-04-30 MED ORDER — OXYCODONE HCL 5 MG/5ML PO SOLN
5.0000 mg | ORAL | Status: DC | PRN
  Administered 2017-04-30 – 2017-05-01 (×3): 10 mg via ORAL
  Filled 2017-04-30 (×3): qty 10

## 2017-04-30 MED ORDER — ACETAMINOPHEN 500 MG PO TABS
1000.0000 mg | ORAL_TABLET | ORAL | Status: AC
  Administered 2017-04-30: 1000 mg via ORAL
  Filled 2017-04-30: qty 2

## 2017-04-30 MED ORDER — CEFOTETAN DISODIUM-DEXTROSE 2-2.08 GM-%(50ML) IV SOLR
2.0000 g | INTRAVENOUS | Status: AC
  Administered 2017-04-30: 2 g via INTRAVENOUS
  Filled 2017-04-30: qty 50

## 2017-04-30 MED ORDER — HYDROMORPHONE HCL 1 MG/ML IJ SOLN: 0.2500 mg | INTRAMUSCULAR | Status: DC | PRN

## 2017-04-30 MED ORDER — OXYCODONE HCL 5 MG/5ML PO SOLN
5.0000 mg | Freq: Once | ORAL | Status: DC | PRN
  Filled 2017-04-30: qty 5

## 2017-04-30 MED ORDER — 0.9 % SODIUM CHLORIDE (POUR BTL) OPTIME
TOPICAL | Status: DC | PRN
  Administered 2017-04-30: 1000 mL

## 2017-04-30 MED ORDER — TISSEEL VH 10 ML EX KIT
PACK | CUTANEOUS | Status: AC
  Filled 2017-04-30: qty 2

## 2017-04-30 MED ORDER — FENTANYL CITRATE (PF) 100 MCG/2ML IJ SOLN
INTRAMUSCULAR | Status: DC | PRN
  Administered 2017-04-30 (×2): 50 ug via INTRAVENOUS

## 2017-04-30 MED ORDER — ONDANSETRON HCL 4 MG/2ML IJ SOLN
INTRAMUSCULAR | Status: AC
  Filled 2017-04-30: qty 2

## 2017-04-30 MED ORDER — LACTATED RINGERS IV SOLN
INTRAVENOUS | Status: DC
Start: 2017-04-30 — End: 2017-04-30
  Administered 2017-04-30 (×2): via INTRAVENOUS

## 2017-04-30 MED ORDER — ROCURONIUM BROMIDE 10 MG/ML (PF) SYRINGE
PREFILLED_SYRINGE | INTRAVENOUS | Status: DC | PRN
  Administered 2017-04-30: 20 mg via INTRAVENOUS
  Administered 2017-04-30: 60 mg via INTRAVENOUS
  Administered 2017-04-30: 10 mg via INTRAVENOUS

## 2017-04-30 MED ORDER — ONDANSETRON HCL 4 MG/2ML IJ SOLN
INTRAMUSCULAR | Status: DC | PRN
  Administered 2017-04-30: 4 mg via INTRAVENOUS

## 2017-04-30 MED ORDER — DILTIAZEM HCL ER COATED BEADS 120 MG PO CP24
240.0000 mg | ORAL_CAPSULE | Freq: Every morning | ORAL | Status: DC
  Administered 2017-05-01: 240 mg via ORAL
  Filled 2017-04-30: qty 1
  Filled 2017-04-30: qty 2

## 2017-04-30 SURGICAL SUPPLY — 60 items
APPLICATOR COTTON TIP 6IN STRL (MISCELLANEOUS) ×3 IMPLANT
APPLIER CLIP ROT 10 11.4 M/L (STAPLE)
APPLIER CLIP ROT 13.4 12 LRG (CLIP)
BLADE SURG 15 STRL LF DISP TIS (BLADE) ×1 IMPLANT
BLADE SURG 15 STRL SS (BLADE) ×2
CABLE HIGH FREQUENCY MONO STRZ (ELECTRODE) ×3 IMPLANT
CHLORAPREP W/TINT 26ML (MISCELLANEOUS) ×9 IMPLANT
CLIP APPLIE ROT 10 11.4 M/L (STAPLE) IMPLANT
CLIP APPLIE ROT 13.4 12 LRG (CLIP) IMPLANT
DERMABOND ADVANCED (GAUZE/BANDAGES/DRESSINGS) ×2
DERMABOND ADVANCED .7 DNX12 (GAUZE/BANDAGES/DRESSINGS) ×1 IMPLANT
DEVICE SUT QUICK LOAD TK 5 (STAPLE) IMPLANT
DEVICE SUT TI-KNOT TK 5X26 (MISCELLANEOUS) IMPLANT
DEVICE SUTURE ENDOST 10MM (ENDOMECHANICALS) IMPLANT
DEVICE TI KNOT TK5 (MISCELLANEOUS)
DISSECTOR BLUNT TIP ENDO 5MM (MISCELLANEOUS) IMPLANT
DRAPE UTILITY XL STRL (DRAPES) ×6 IMPLANT
ELECT REM PT RETURN 15FT ADLT (MISCELLANEOUS) ×3 IMPLANT
GAUZE SPONGE 4X4 12PLY STRL (GAUZE/BANDAGES/DRESSINGS) IMPLANT
GLOVE SURG SIGNA 7.5 PF LTX (GLOVE) ×3 IMPLANT
GOWN STRL REUS W/TWL XL LVL3 (GOWN DISPOSABLE) ×9 IMPLANT
GRASPER SUT TROCAR 14GX15 (MISCELLANEOUS) IMPLANT
HEMOSTAT SURGICEL 2X14 (HEMOSTASIS) ×3 IMPLANT
HOVERMATT SINGLE USE (MISCELLANEOUS) ×3 IMPLANT
KIT BASIN OR (CUSTOM PROCEDURE TRAY) ×3 IMPLANT
MARKER SKIN DUAL TIP RULER LAB (MISCELLANEOUS) ×3 IMPLANT
NEEDLE SPNL 22GX3.5 QUINCKE BK (NEEDLE) ×3 IMPLANT
PACK UNIVERSAL I (CUSTOM PROCEDURE TRAY) ×3 IMPLANT
QUICK LOAD TK 5 (STAPLE)
RELOAD STAPLER BLUE 60MM (STAPLE) ×3 IMPLANT
RELOAD STAPLER GOLD 60MM (STAPLE) ×2 IMPLANT
RELOAD STAPLER GREEN 60MM (STAPLE) ×2 IMPLANT
SCISSORS LAP 5X35 DISP (ENDOMECHANICALS) IMPLANT
SEALANT SURGICAL APPL DUAL CAN (MISCELLANEOUS) IMPLANT
SET IRRIG TUBING LAPAROSCOPIC (IRRIGATION / IRRIGATOR) ×3 IMPLANT
SHEARS HARMONIC ACE PLUS 45CM (MISCELLANEOUS) ×3 IMPLANT
SLEEVE ADV FIXATION 5X100MM (TROCAR) ×6 IMPLANT
SLEEVE GASTRECTOMY 36FR VISIGI (MISCELLANEOUS) ×3 IMPLANT
SOLUTION ANTI FOG 6CC (MISCELLANEOUS) ×3 IMPLANT
SPONGE LAP 18X18 X RAY DECT (DISPOSABLE) ×3 IMPLANT
STAPLER ECHELON BIOABSB 60 FLE (MISCELLANEOUS) ×21 IMPLANT
STAPLER ECHELON LONG 60 440 (INSTRUMENTS) ×3 IMPLANT
STAPLER RELOAD BLUE 60MM (STAPLE) ×9
STAPLER RELOAD GOLD 60MM (STAPLE) ×6
STAPLER RELOAD GREEN 60MM (STAPLE) ×6
SUT MNCRL AB 4-0 PS2 18 (SUTURE) ×6 IMPLANT
SUT SURGIDAC NAB ES-9 0 48 120 (SUTURE) IMPLANT
SUT VICRYL 0 TIES 12 18 (SUTURE) IMPLANT
SYR 10ML ECCENTRIC (SYRINGE) ×3 IMPLANT
SYR CONTROL 10ML LL (SYRINGE) ×3 IMPLANT
TOWEL OR 17X26 10 PK STRL BLUE (TOWEL DISPOSABLE) ×3 IMPLANT
TOWEL OR NON WOVEN STRL DISP B (DISPOSABLE) ×3 IMPLANT
TROCAR ADV FIXATION 12X100MM (TROCAR) ×3 IMPLANT
TROCAR ADV FIXATION 5X100MM (TROCAR) ×3 IMPLANT
TROCAR BLADELESS 15MM (ENDOMECHANICALS) ×3 IMPLANT
TROCAR BLADELESS OPT 5 100 (ENDOMECHANICALS) ×3 IMPLANT
TUBING CONNECTING 10 (TUBING) ×2 IMPLANT
TUBING CONNECTING 10' (TUBING) ×1
TUBING ENDO SMARTCAP PENTAX (MISCELLANEOUS) ×3 IMPLANT
TUBING INSUF HEATED (TUBING) ×3 IMPLANT

## 2017-04-30 NOTE — Anesthesia Preprocedure Evaluation (Signed)
Anesthesia Evaluation  Patient identified by MRN, date of birth, ID band Patient awake    Reviewed: Allergy & Precautions, NPO status , Patient's Chart, lab work & pertinent test results  Airway Mallampati: III  TM Distance: >3 FB Neck ROM: Full    Dental  (+) Teeth Intact, Dental Advisory Given   Pulmonary    breath sounds clear to auscultation       Cardiovascular hypertension,  Rhythm:Regular Rate:Normal     Neuro/Psych    GI/Hepatic   Endo/Other  diabetes  Renal/GU      Musculoskeletal   Abdominal (+) + obese,   Peds  Hematology   Anesthesia Other Findings   Reproductive/Obstetrics                             Anesthesia Physical Anesthesia Plan  ASA: III  Anesthesia Plan: General   Post-op Pain Management:    Induction: Intravenous, Cricoid pressure planned and Rapid sequence  PONV Risk Score and Plan: Ondansetron  Airway Management Planned: Video Laryngoscope Planned  Additional Equipment:   Intra-op Plan:   Post-operative Plan: Extubation in OR  Informed Consent: I have reviewed the patients History and Physical, chart, labs and discussed the procedure including the risks, benefits and alternatives for the proposed anesthesia with the patient or authorized representative who has indicated his/her understanding and acceptance.   Dental advisory given  Plan Discussed with: CRNA and Anesthesiologist  Anesthesia Plan Comments: (Plan GA with RSI and glidescope. Will put magnet over Medtronic ICD Glucose 235)        Anesthesia Quick Evaluation

## 2017-04-30 NOTE — Interval H&P Note (Signed)
History and Physical Interval Note:  04/30/2017 12:28 PM  Craig Lindsey  has presented today for surgery, with the diagnosis of MORBID OBESITY  The various methods of treatment have been discussed with the patient and family.  Wife with patient.  After consideration of risks, benefits and other options for treatment, the patient has consented to  Procedure(s): LAPAROSCOPIC GASTRIC SLEEVE RESECTION WITH UPPER ENDO (N/A) as a surgical intervention .  The patient's history has been reviewed, patient examined, no change in status, stable for surgery.  I have reviewed the patient's chart and labs.  Questions were answered to the patient's satisfaction.     Shann Medal

## 2017-04-30 NOTE — Op Note (Signed)
PATIENT:   Craig Lindsey DOB:   01-11-1961 MRN:   [id number]  DATE OF PROCEDURE: 04/30/2017                   FACILITY:  [facility]  OPERATIVE REPORT  PREOPERATIVE DIAGNOSIS:  Morbid obesity, right sided abdominal wall hernia  POSTOPERATIVE DIAGNOSIS:  Morbid obesity (weight 452, BMI of 55), right sided abdominal wall hernia  PROCEDURE:  Laparoscopic Sleeve gastrectomy (intraoperative upper endoscopy by Dr. Ok Anis)  SURGEON:  Fenton Malling. Lucia Gaskins, MD  FIRST ASSISTANTOk Anis, MD  ANESTHESIA:  General endotracheal.  Anesthesiologist: Roberts Gaudy, MD CRNA: Lind Covert, CRNA; Glory Buff, CRNA  General  ESTIMATED BLOOD LOSS:  Minimal.  LOCAL ANESTHESIA:  30 cc of 1/4% Marcaine + 20 cc of Exparel  COMPLICATIONS:  None.  INDICATION FOR SURGERY:  Craig Lindsey is a 57 y.o. white male who sees Venia Carbon, MD as his primary care doctor.  He has completed our preoperative bariatric program and now comes for a laparoscopic sleeve gastrectomy.  The indications, potential complications of surgery were explained to the patient.  Potential complications of the surgery include, but are not limited to, bleeding, infection, DVT, open surgery, and long-term nutritional consequences.  OPERATIVE NOTE:  The patient taken to room #1 at [facility] where Ms. Shon Baton underwent a general endotracheal anesthetic, supervised by Anesthesiologist: Roberts Gaudy, MD CRNA: Lind Covert, CRNA; Glory Buff, CRNA.  The patient was given 2 g of cefotetan at the beginning of the procedure.  A time-out was held and surgical checklist run.  I accessed her abdominal cavity through the left upper quadrant with a 5 mm Optiview. I did an abdominal exploration.   He has a right sided abdominal wall hernia with omentum in the hernia.  The right and left lobes of the liver unremarkable. Gallbladder was absent. His stomach was unremarkable, but the lesser curve was covered in fate.  I  placed a total of 6 trocars. I placed a 5 mm left lateral trocar, a 5 mm left paramedian trocar (for the scope), a 15 mm right paramedian torcar, a 5 mm right subcostal trocar and 5 mm subxiphoid trocar for the liver retractor.  I placed a abdominal wall anesthetic block using a mixture of 1/4% Marcaine and Exparel.  I used 20 cc per side, for a total of 40 cc.  I started out taking down the greater curvature attachments of the stomach. I measured approximately 6 cm proximal from the pylorus and mobilized the greater curvature of the stomach with the Harmonic Scalpel. I took this dissection cranially around the greater curvature of her stomach to the angle of His and the left crus.   He has a prominent fat pat at his EG junction.  After I had mobilized the greater curvature of the stomach, I then passed the 36 French ViSiGi bougie which was used to suck the stomach up against the lesser curvature and placed into the antrum. During the staple firing,  I tried to give the [place] a cuff at least about 1 cm. I tried to avoid narrowing the incisura. I used a total of 7 staple firings. I used "pants" on each staple firing.   From antrum to the angle of His, I used 2 green, 2 gold and 3 blue [place]on 60 mm Ethicon staplers.  At each firing of the EndoGIA stapler, I inspected the stomach, anterior wall of the stomach, and underneath to make sure  there was no compromise or impingement on to the ViSiGi bougie.   The staple line seemed linear without any corkscrewing of the stomach. Hemostasis was good. I did use reinforcement on the staple line.  Because I thought we had a good staple line, I then had the ViSiGi was converted to insufflate the pouch. The new stomach pouch was placed under water. There was no bubbling or leak noted.   At this point, Dr. Redmond Pulling broke scrub and passed an upper endoscope down through the esophagus into the stomach pouch. The EG junction was at about 49 cm.  The stomach was tubular.  There was no narrowing of the stomach pouch or angulation. We were easily able to pass the endoscope into the antrum and again put air pressure on the staple line. I irrigated the upper abdomen with saline. There was no bubbling or evidence of air leak. The mucosa looked viable. Dr. Redmond Pulling decompressed the stomach with the endoscopy.   I extracted the stomach remnant through the right paramedian 15 mm trocar and sent this to Pathology.   I aspirated out the saline that I had irrigated because I thought the staple line looked viable and complete. There was no evidence of leak. I did not leave a drain in place.   Then, I closed the trocar sites. I placed a 0 Vicryl suture at the 15-mm port site in the right mid abdomen. The other port sites seemed smaller not requiring sutures. I closed the skin at each site with a 4-0 Monocryl, painted each wound with LiquiBand.   The patient was transported to recovery room in good condition. Sponge and needle count were correct at the end of the case.   I have a surgeon as a first assist to retract, expose, and assist on this difficult operation.   Alphonsa Overall, MD, [facility] Pager: [phone] Office phone:  [phone]

## 2017-04-30 NOTE — Op Note (Signed)
LEYLAND KENNA [id number] 1960-05-01 04/30/2017  Preoperative diagnosis: morbid obesity  Postoperative diagnosis: Same   Procedure: upper endoscopy   Surgeon: Leighton Ruff. Dilon Lank M.D., FACS   Anesthesia: Gen.   Indications for procedure: 57 y.o. year old male undergoing Laparoscopic Gastric Sleeve Resection and an EGD was requested to evaluate the new gastric sleeve.   Description of procedure: After we have completed the sleeve resection, I scrubbed out and obtained the Olympus endoscope. I gently placed endoscope in the patient's oropharynx and gently glided it down the esophagus without any difficulty under direct visualization. Once I was in the gastric sleeve, I insufflated the stomach with air. I was able to cannulate and advanced the scope through the gastric sleeve. I was able to cannulate the duodenum with ease. Dr. Lucia Gaskins had placed saline in the upper abdomen. Upon further insufflation of the gastric sleeve there was no evidence of bubbles. GE junction located at 48 cm.  Upon further inspection of the gastric sleeve, the mucosa appeared normal. There is no evidence of any mucosal abnormality. The sleeve was widely patent at the angularis. There was no evidence of bleeding. The gastric sleeve was decompressed. The scope was withdrawn. The patient tolerated this portion of the procedure well. Please see Dr Pollie Friar operative note for details regarding the laparoscopic gastric sleeve resection.   Leighton Ruff. Redmond Pulling, MD, FACS  General, Bariatric, & Minimally Invasive Surgery  [facility], [place]

## 2017-04-30 NOTE — Transfer of Care (Signed)
Immediate Anesthesia Transfer of Care Note  Patient: Craig Lindsey  Procedure(s) Performed: LAPAROSCOPIC GASTRIC SLEEVE RESECTION WITH UPPER ENDO (N/A )  Patient Location: PACU  Anesthesia Type:General  Level of Consciousness: awake, alert  and oriented  Airway & Oxygen Therapy: Patient Spontanous Breathing and Patient connected to face mask oxygen  Post-op Assessment: Report given to RN and Post -op Vital signs reviewed and stable  Post vital signs: Reviewed and stable  Last Vitals:  Vitals Value Taken Time  BP 155/70 04/30/2017  3:41 PM  Temp    Pulse 63 04/30/2017  3:43 PM  Resp 16 04/30/2017  3:43 PM  SpO2 100 % 04/30/2017  3:43 PM  Vitals shown include unvalidated device data.  Last Pain:  Vitals:   04/30/17 0959  TempSrc: Oral      Patients Stated Pain Goal: 4 (12/06/[birth year] 4469)  Complications: No apparent anesthesia complications

## 2017-04-30 NOTE — Anesthesia Procedure Notes (Signed)
Procedure Name: Intubation Date/Time: 04/30/2017 1:08 PM Performed by: Lind Covert, CRNA Pre-anesthesia Checklist: Patient identified, Emergency Drugs available, Suction available, Patient being monitored and Timeout performed Patient Re-evaluated:Patient Re-evaluated prior to induction Oxygen Delivery Method: Circle system utilized Preoxygenation: Pre-oxygenation with 100% oxygen Induction Type: IV induction, Rapid sequence and Cricoid Pressure applied Laryngoscope Size: Mac, 4 and Glidescope Grade View: Grade I Tube type: Oral Tube size: 7.5 mm Number of attempts: 1 Airway Equipment and Method: Stylet Placement Confirmation: ETT inserted through vocal cords under direct vision,  positive ETCO2 and breath sounds checked- equal and bilateral Secured at: 23 cm Tube secured with: Tape Dental Injury: Teeth and Oropharynx as per pre-operative assessment  Difficulty Due To: Difficulty was anticipated, Difficult Airway- due to large tongue and Difficult Airway- due to limited oral opening

## 2017-05-01 ENCOUNTER — Telehealth: Payer: Self-pay | Admitting: Internal Medicine

## 2017-05-01 LAB — GLUCOSE, CAPILLARY
GLUCOSE-CAPILLARY: 182 mg/dL — AB (ref 65–99)
GLUCOSE-CAPILLARY: 157 mg/dL — AB (ref 65–99)
Glucose-Capillary: 166 mg/dL — ABNORMAL HIGH (ref 65–99)

## 2017-05-01 LAB — CBC WITH DIFFERENTIAL/PLATELET
BASOS ABS: 0.1 10*3/uL (ref 0.0–0.1)
BASOS PCT: 1 %
Eosinophils Absolute: 0.1 10*3/uL (ref 0.0–0.7)
Eosinophils Relative: 1 %
HCT: 47.9 % (ref 39.0–52.0)
Hemoglobin: 15.4 g/dL (ref 13.0–17.0)
LYMPHS PCT: 18 %
Lymphs Abs: 2 10*3/uL (ref 0.7–4.0)
MCH: 30.9 pg (ref 26.0–34.0)
MCHC: 32.2 g/dL (ref 30.0–36.0)
MCV: 96 fL (ref 78.0–100.0)
MONO ABS: 1.2 10*3/uL — AB (ref 0.1–1.0)
Monocytes Relative: 11 %
NEUTROS PCT: 69 %
Neutro Abs: 7.6 10*3/uL (ref 1.7–7.7)
PLATELETS: 142 10*3/uL — AB (ref 150–400)
RBC: 4.99 MIL/uL (ref 4.22–5.81)
RDW: 14.1 % (ref 11.5–15.5)
WBC: 11 10*3/uL — AB (ref 4.0–10.5)

## 2017-05-01 MED FILL — oxyCODONE HCL 5 MG/5ML SOLN: 5 | 3 days supply | Qty: 200 | Fill #0

## 2017-05-01 NOTE — Telephone Encounter (Signed)
  Per the notes from the discharge instructions, pt's insulin should be given with breakfast and supper. Pt had bariatric surgery yesterday and was discharged today. Wife was instructed by Dr Lucia Gaskins to call PCP with his blood sugars and to check his blood sugar every 4 hours. .  She is asking if he needs to have his 70/30 given as per his sliding scale as per d/c instructions (breakfast and supper) or does he need to have it Q4 hours. Wife was concerned about this because his intake will be limited to liquids for 2 weeks.   Pt wife calling to report blood sugars so far. 166, 182, 179, [id number]. These were taken in the hospital. Pt just discharged today. Wife advised that 70/30 is a type of insulin that is not given every 4 hours.  Advised wife to follow surgeons discharge instructions. Advised wife to give the 70/30 with breakfast and supper only and to keep a log of all his blood sugars.

## 2017-05-01 NOTE — Telephone Encounter (Signed)
Wife advised. 

## 2017-05-01 NOTE — Anesthesia Postprocedure Evaluation (Signed)
Anesthesia Post Note  Patient: Craig Lindsey  Procedure(s) Performed: LAPAROSCOPIC GASTRIC SLEEVE RESECTION WITH UPPER ENDO (N/A )     Patient location during evaluation: PACU Anesthesia Type: General Level of consciousness: awake and alert Pain management: pain level controlled Vital Signs Assessment: post-procedure vital signs reviewed and stable Respiratory status: spontaneous breathing, nonlabored ventilation, respiratory function stable and patient connected to nasal cannula oxygen Cardiovascular status: blood pressure returned to baseline and stable Postop Assessment: no apparent nausea or vomiting Anesthetic complications: no    Last Vitals:  Vitals:   05/01/17 1100 05/01/17 1200  BP: (!) 179/62   Pulse:    Resp: 19 17  Temp:    SpO2: 94% 94%    Last Pain:  Vitals:   05/01/17 1213  TempSrc:   PainSc: 4                  Shylie Polo COKER

## 2017-05-01 NOTE — Discharge Summary (Signed)
Physician Discharge Summary  Patient ID:  Craig Lindsey  MRN: [id number]  DOB/AGE: 1960/09/04 57 y.o.  Admit date: 04/30/2017 Discharge date: 05/01/2017  Discharge Diagnoses:  1.  Morbid obesity  Weight - 452, BMI - 55.8 2.  DIABETES MELLITUS TYPE 2 IN OBESE (E11.69)  Now on insulin x 1 1/2 year.  Sees Dr. [name] for this. 3.  OBSTRUCTIVE SLEEP APNEA (G47.33)      On CPAP 4.  VENTRAL INCISIONAL HERNIA (K43.2) Upper incision to the right.       We have talked about repairing this when his weight is 100 pounds below his pre op weight. 5.  ANTICOAGULATED (Z79.01)             Xarelto held for surgery, will restart tomorrow  6. Sigmoid colon resection for adenoca, T3, N1 (1/12 nodes) - 02/13/2007 - D. Rilley Stash GI Carlean Purl last colonoscopy 2015 was negative. Oncology - Benay Spice. He did get chemotx. Disease free. 7. History of ventricular fibrillation -  Followed by Dr. Beckie Salts 8. AICD in place. Originally placed in 2004. He said that it has never gone off.  Replaced in 2019.  9. Complex left renal cyst. 10. He is on Chronic disability for his legs and knees since 2010.   Active Problems:   Morbid obesity with BMI of 50.0-59.9, adult ([facility])   Operation: Procedure(s):  LAPAROSCOPIC GASTRIC SLEEVE RESECTION WITH UPPER ENDO on 04/30/2017 - D. [place]  Discharged Condition: good  Hospital Course: Craig Lindsey is an 57 y.o. male whose primary care physician is Viviana Simpler I, MD and who was admitted 04/30/2017 with a chief complaint of morbid obesity.   He was brought to the operating room on 04/30/2017 and underwent  LAPAROSCOPIC GASTRIC SLEEVE RESECTION WITH UPPER ENDO.   He was kept overnight in step down because of his multiple medical problems. He did well overnight. He is now one day post op.  He has tolerated his protein drinks.  He is not nauseated.  He has ambulated around the unit and  is ready to go home.  His discharge instructions have been reviewed.  He already has his discharge meds at home. He will restart his Xarelto tomorrow. He is to check with his PCP in 2 weeks for follow up.  The discharge instructions were reviewed with the patient.  Consults: None  Significant Diagnostic Studies: Results for orders placed or performed during the hospital encounter of 04/30/17  Glucose, capillary  Result Value Ref Range   Glucose-Capillary 235 (H) 65 - 99 mg/dL  Glucose, capillary  Result Value Ref Range   Glucose-Capillary 160 (H) 65 - 99 mg/dL   Comment 1 Notify RN    Comment 2 Document in Chart   Glucose, capillary  Result Value Ref Range   Glucose-Capillary 162 (H) 65 - 99 mg/dL  CBC WITH DIFFERENTIAL  Result Value Ref Range   WBC 11.0 (H) 4.0 - 10.5 K/uL   RBC 4.99 4.22 - 5.81 MIL/uL   Hemoglobin 15.4 13.0 - 17.0 g/dL   HCT 47.9 39.0 - 52.0 %   MCV 96.0 78.0 - 100.0 fL   MCH 30.9 26.0 - 34.0 pg   MCHC 32.2 30.0 - 36.0 g/dL   RDW 14.1 11.5 - 15.5 %   Platelets 142 (L) 150 - 400 K/uL   Neutrophils Relative % 69 %   Lymphocytes Relative 18 %   Monocytes Relative 11 %   Eosinophils Relative 1 %   Basophils Relative 1 %  Neutro Abs 7.6 1.7 - 7.7 K/uL   Lymphs Abs 2.0 0.7 - 4.0 K/uL   Monocytes Absolute 1.2 (H) 0.1 - 1.0 K/uL   Eosinophils Absolute 0.1 0.0 - 0.7 K/uL   Basophils Absolute 0.1 0.0 - 0.1 K/uL   Smear Review MORPHOLOGY UNREMARKABLE   Hemoglobin and hematocrit, blood  Result Value Ref Range   Hemoglobin 15.4 13.0 - 17.0 g/dL   HCT 46.2 39.0 - 52.0 %  Glucose, capillary  Result Value Ref Range   Glucose-Capillary 180 (H) 65 - 99 mg/dL   Comment 1 Notify RN   Glucose, capillary  Result Value Ref Range   Glucose-Capillary 179 (H) 65 - 99 mg/dL   Comment 1 Notify RN   Glucose, capillary  Result Value Ref Range   Glucose-Capillary 182 (H) 65 - 99 mg/dL   Comment 1 Notify RN   Glucose, capillary  Result Value Ref Range    Glucose-Capillary 166 (H) 65 - 99 mg/dL   Comment 1 Notify RN    Comment 2 Document in Chart   Glucose, capillary  Result Value Ref Range   Glucose-Capillary 157 (H) 65 - 99 mg/dL   Comment 1 Notify RN    Comment 2 Document in Chart     No results found.  Discharge Exam:  Vitals:   05/01/17 1100 05/01/17 1200  BP: (!) 179/62   Pulse:    Resp: 19 17  Temp:    SpO2: 94% 94%    General: Obese WM who is alert and generally healthy appearing.  Lungs: Clear to auscultation and symmetric breath sounds. Heart:  RRR. No murmur or rub. Abdomen: Soft. No mass. No hernia. Normal bowel sounds.     His incisions look good.  He has bowel sounds.  Discharge Medications:   Allergies as of 05/01/2017   No Known Allergies     Medication List    TAKE these medications   ACCU-CHEK FASTCLIX LANCETS Misc Use to check blood sugar three times a day.  Dx: E11.40   amiodarone 200 MG tablet Commonly known as:  PACERONE Take 1 tablet (200 mg total) by mouth daily. Notes to patient:  Monitor Blood Pressure Daily and keep a log for primary care physician.  You may need to make changes to your medications with rapid weight loss.     atorvastatin 10 MG tablet Commonly known as:  LIPITOR Take 1 tablet (10 mg total) by mouth daily at 6 PM.   BD ULTRA-FINE PEN NEEDLES 29G X 12.7MM Misc Generic drug:  Insulin Pen Needle USE  TO INJECT TWICE DAILY  TO THREE TIMES DAILY   diltiazem 240 MG 24 hr capsule Commonly known as:  DILT-XR Take 1 capsule (240 mg total) by mouth every morning. Notes to patient:  Monitor Blood Pressure Daily and keep a log for primary care physician.  You may need to make changes to your medications with rapid weight loss.     furosemide 40 MG tablet Commonly known as:  LASIX Take one tablet by mouth daily and as needed What changed:    how much to take  how to take this  when to take this  additional instructions   glipiZIDE 10 MG 24 hr tablet Commonly known as:   GLUCOTROL XL Take 1 tablet (10 mg total) by mouth daily with breakfast. Notes to patient:  Monitor Blood Sugar Frequently and keep a log for primary care physician, you may need to adjust medication dosage with rapid weight loss.  glucose blood test strip Commonly known as:  ACCU-CHEK SMARTVIEW Use as instructed to test blood sugar three times daily E11.21   hydrALAZINE 25 MG tablet Commonly known as:  APRESOLINE Take 25 mg by mouth 2 (two) times daily. Notes to patient:  Monitor Blood Pressure Daily and keep a log for primary care physician.  You may need to make changes to your medications with rapid weight loss.     insulin aspart protamine - aspart (70-30) 100 UNIT/ML FlexPen Commonly known as:  NOVOLOG MIX 70/30 FLEXPEN USE SLIDING SCALE BETWEEN 50 TO 70 UNITS TWICE DAILY (WITH BREAKFAST AND SUPPER). MAX DAILY DOSE 150 UNITS What changed:    how much to take  how to take this  when to take this  additional instructions   lisinopril 40 MG tablet Commonly known as:  PRINIVIL,ZESTRIL Take 1 tablet (40 mg total) by mouth daily. Notes to patient:  Monitor Blood Pressure Daily and keep a log for primary care physician.  You may need to make changes to your medications with rapid weight loss.     metoprolol succinate 100 MG 24 hr tablet Commonly known as:  TOPROL-XL Take 2 tablets (200 mg total) by mouth daily. Notes to patient:  Monitor Blood Pressure Daily and keep a log for primary care physician.  You may need to make changes to your medications with rapid weight loss.     multivitamin per tablet Take 1 tablet by mouth daily.   rivaroxaban 20 MG Tabs tablet Commonly known as:  XARELTO TAKE 1 TABLET EVERY DAY WITH SUPPER What changed:    how much to take  how to take this  when to take this  additional instructions       Disposition: Discharge disposition: 01-Home or Self Care       Discharge Instructions    Ambulate hourly while awake   Complete  by:  As directed    Call MD for:  difficulty breathing, headache or visual disturbances   Complete by:  As directed    Call MD for:  persistant dizziness or light-headedness   Complete by:  As directed    Call MD for:  persistant nausea and vomiting   Complete by:  As directed    Call MD for:  redness, tenderness, or signs of infection (pain, swelling, redness, odor or green/yellow discharge around incision site)   Complete by:  As directed    Call MD for:  severe uncontrolled pain   Complete by:  As directed    Call MD for:  temperature >101 F   Complete by:  As directed    Diet bariatric full liquid   Complete by:  As directed    Incentive spirometry   Complete by:  As directed    Perform hourly while awake      Follow-up Information    Surgery, [place]. Go on 05/25/2017.   Specialty:  General Surgery Why:  at 330 with Dr Alphonsa Overall Contact information: [address] [place] [address] [phone]        Surgery, [place] .   Specialty:  General Surgery Contact information: [address] [place] [address] [phone]            Signed: Alphonsa Overall, M.D., [facility] Office:  [phone]  05/01/2017, 4:05 PM

## 2017-05-01 NOTE — Progress Notes (Signed)
Patient alert and oriented, Post op day 1.  Provided support and encouragement.  Encouraged pulmonary toilet, ambulation and small sips of liquids.  Patient completed 12 ounces of clear fluid overnight BNC  started protein shake.  IS instruction provided.  1750 demonstrated to [facility].  All questions answered.  Will continue to monitor.

## 2017-05-01 NOTE — Telephone Encounter (Signed)
Dr [name] out of office this afternoon; Please advise. Will fwd to Dr Damita Dunnings and Dr [name].

## 2017-05-01 NOTE — Discharge Instructions (Signed)
° ° ° °GASTRIC BYPASS/SLEEVE ° Home Care Instructions ° ° These instructions are to help you care for yourself when you go home. ° °Call: If you have any problems. °• Call [phone] and ask for the surgeon on call °• If you need immediate help, come to the ER at [place].  °• Tell the ER staff that you are a new post-op gastric bypass or gastric sleeve patient °  °Signs and symptoms to report: • Severe vomiting or nausea °o If you cannot keep down clear liquids for longer than 1 day, call your surgeon  °• Abdominal pain that does not get better after taking your pain medication °• Fever over 100.4° F with chills °• Heart beating over 100 beats a minute °• Shortness of breath at rest °• Chest pain °•  Redness, swelling, drainage, or foul odor at incision (surgical) sites °•  If your incisions open or pull apart °• Swelling or pain in calf (lower leg) °• Diarrhea (Loose bowel movements that happen often), frequent watery, uncontrolled bowel movements °• Constipation, (no bowel movements for 3 days) if this happens: Pick one °o Milk of Magnesia, 2 tablespoons by mouth, 3 times a day for 2 days if needed °o Stop taking Milk of Magnesia once you have a bowel movement °o Call your doctor if constipation continues °Or °o Miralax  (instead of Milk of Magnesia) following the label instructions °o Stop taking Miralax once you have a bowel movement °o Call your doctor if constipation continues °• Anything you think is not normal °  °Normal side effects after surgery: • Unable to sleep at night or unable to focus °• Irritability or moody °• Being tearful (crying) or depressed °These are common complaints, possibly related to your anesthesia medications that put you to sleep, stress of surgery, and change in lifestyle.  This usually goes away a few weeks after surgery.  If these feelings continue, call your primary care doctor. °  °Wound Care: You may have surgical glue, steri-strips, or staples over your incisions after  surgery °• Surgical glue:  Looks like a clear film over your incisions and will wear off a little at a time °• Steri-strips: Strips of tape over your incisions. You may notice a yellowish color on the skin under the steri-strips. This is used to make the   steri-strips stick better. Do not pull the steri-strips off - let them fall off °• Staples: Staples may be removed before you leave the hospital °o If you go home with staples, call [facility], [phone] at for an appointment with your surgeon’s nurse to have staples removed 10 days after surgery. °• Showering: You may shower two (2) days after your surgery unless your surgeon tells you differently °o Wash gently around incisions with warm soapy water, rinse well, and gently pat dry  °o No tub baths until staples are removed, steri-strips fall off or glue is gone.  °  °Medications: • Medications should be liquid or crushed if larger than the size of a dime °• Extended release pills (medication that release a little bit at a time through the day) should NOT be crushed or cut. (examples include XL, ER, DR, SR) °• Depending on the size and number of medications you take, you may need to space (take a few throughout the day)/change the time you take your medications so that you do not over-fill your pouch (smaller stomach) °• Make sure you follow-up with your primary care doctor to   make medication changes needed during rapid weight loss and life-style changes °• If you have diabetes, follow up with the doctor that orders your diabetes medication(s) within one week after surgery and check your blood sugar regularly. °• Do not drive while taking prescription pain medication  °• It is ok to take Tylenol by the bottle instructions with your pain medicine or instead of your pain medicine as needed.  DO NOT TAKE NSAIDS (EXAMPLES OF NSAIDS:  IBUPROFREN/ NAPROXEN)  °Diet:                    First 2 Weeks ° You will see the dietician t about two (2) weeks  after your surgery. The dietician will increase the types of foods you can eat if you are handling liquids well: °• If you have severe vomiting or nausea and cannot keep down clear liquids lasting longer than 1 day, call your surgeon @ ([phone]) °Protein Shake °• Drink at least 2 ounces of shake 5-6 times per day °• Each serving of protein shakes (usually 8 - 12 ounces) should have: °o 15 grams of protein  °o And no more than 5 grams of carbohydrate  °• Goal for protein each day: °o Men = 80 grams per day °o Women = 60 grams per day °• Protein powder may be added to fluids such as non-fat milk or Lactaid milk or unsweetened Soy/Almond milk (limit to 35 grams added protein powder per serving) ° °Hydration °• Slowly increase the amount of water and other clear liquids as tolerated (See Acceptable Fluids) °• Slowly increase the amount of protein shake as tolerated  °•  Sip fluids slowly and throughout the day.  Do not use straws. °• May use sugar substitutes in small amounts (no more than 6 - 8 packets per day; i.e. Splenda) ° °Fluid Goal °• The first goal is to drink at least 8 ounces of protein shake/drink per day (or as directed by the nutritionist); some examples of protein shakes are Syntrax Nectar, Adkins Advantage, EAS Edge HP, and Unjury. See handout from pre-op Bariatric Education Class: °o Slowly increase the amount of protein shake you drink as tolerated °o You may find it easier to slowly sip shakes throughout the day °o It is important to get your proteins in first °• Your fluid goal is to drink 64 - 100 ounces of fluid daily °o It may take a few weeks to build up to this °• 32 oz (or more) should be clear liquids  °And  °• 32 oz (or more) should be full liquids (see below for examples) °• Liquids should not contain sugar, caffeine, or carbonation ° °Clear Liquids: °• Water or Sugar-free flavored water (i.e. Fruit H2O, Propel) °• Decaffeinated coffee or tea (sugar-free) °• Crystal Lite, Wyler’s Lite,  Minute Maid Lite °• Sugar-free Jell-O °• Bouillon or broth °• Sugar-free Popsicle:   *Less than 20 calories each; Limit 1 per day ° °Full Liquids: °Protein Shakes/Drinks + 2 choices per day of other full liquids °• Full liquids must be: °o No More Than 15 grams of Carbs per serving  °o No More Than 3 grams of Fat per serving °• Strained low-fat cream soup (except Cream of Potato or Tomato) °• Non-Fat milk °• Fat-free Lactaid Milk °• Unsweetened Soy Or Unsweetened Almond Milk °• Low Sugar yogurt (Dannon Lite & Fit, Greek yogurt; Oikos Triple Zero; Chobani Simply 100; Yoplait 100 calorie Greek - No Fruit on the Bottom) ° °  °Vitamins   and Minerals  Start 1 day after surgery unless otherwise directed by your surgeon  2 Chewable Bariatric Specific Multivitamin / Multimineral Supplement with iron (Example: Bariatric Advantage Multi EA)  Chewable Calcium with Vitamin D-3 (Example: 3 Chewable Calcium Plus 600 with Vitamin D-3) o Take 500 mg three (3) times a day for a total of 1500 mg each day o Do not take all 3 doses of calcium at one time as it may cause constipation, and you can only absorb 500 mg  at a time  o Do not mix multivitamins containing iron with calcium supplements; take 2 hours apart  Menstruating women and those with a history of anemia (a blood disease that causes weakness) may need extra iron o Talk with your doctor to see if you need more iron  Do not stop taking or change any vitamins or minerals until you talk to your dietitian or surgeon  Your Dietitian and/or surgeon must approve all vitamin and mineral supplements   Activity and Exercise: Limit your physical activity as instructed by your doctor.  It is important to continue walking at home.  During this time, use these guidelines:  Do not lift anything greater than ten (10) pounds for at least two (2) weeks  Do not go back to work or drive until Engineer, production says you can  You may have sex when you feel comfortable  o It is  VERY important for male patients to use a reliable birth control method; fertility often increases after surgery  o All hormonal birth control will be ineffective for 30 days after surgery due to medications given during surgery a barrier method must be used. o Do not get pregnant for at least 18 months  Start exercising as soon as your doctor tells you that you can o Make sure your doctor approves any physical activity  Start with a simple walking program  Walk 5-15 minutes each day, 7 days per week.   Slowly increase until you are walking 30-45 minutes per day Consider joining our [place] program. [phone] or email belt@[employer] .edu   Special Instructions Things to remember:  Use your CPAP when sleeping if this applies to you   [facility] has two free Bariatric Surgery Support Groups that meet monthly o The 3rd Thursday of each month, 6 pm, [facility]  o The 2nd Friday of each month, 11:45 am in the private dining room in the basement of [facility]  It is very important to keep all follow up appointments with your surgeon, dietitian, primary care physician, and behavioral health practitioner  Routine follow up schedule with your surgeon include appointments at 2-3 weeks, 6-8 weeks, 6 months, and 1 year at a minimum.  Your surgeon may request to see you more often.   o After the first year, please follow up with your bariatric surgeon and dietitian at least once a year in order to maintain best weight loss results [facility]: [place]: [phone] Bariatric Nurse Coordinator: [phone]      Reviewed and Endorsed  by [facility] Patient Education Committee, June, 2016 Edits Approved: Aug, 2018  [place] TO PATIENT  Activity:  Driving - May drive in 4 or 5 days, if doing well and off pain meds  Wound Care:   May shower  starting 05/02/2017 (Wednesday)  Diet:  Bariatric diet  Follow up appointment:  Call Dr. Pollie Friar office [facility]  Surgery) at [phone] for an appointment in 2 to 3 weeks.  I can see you in [place] or [place].  Medications and dosages:  Resume your home medications.            You may resume your Xarelto on Wednesday, 05/02/2017.  Call Dr. Lucia Gaskins or his office  [phone]) if you have:  Temperature greater than 100.4,  Persistent nausea and vomiting,  Severe uncontrolled pain,  Redness, tenderness, or signs of infection (pain, swelling, redness, odor or green/yellow discharge around the site),  Difficulty breathing, headache or visual disturbances,  Any other questions or concerns you may have after discharge.  In an emergency, call 911 or go to an Emergency Department at a nearby hospital.

## 2017-05-01 NOTE — Progress Notes (Signed)
[facility] Office:  [phone] General Surgery Progress Note   LOS: 1 day  POD -  1 Day Post-Op  Chief Complaint: Morbid obesity  Assessment and Plan: 1.  LAPAROSCOPIC GASTRIC SLEEVE RESECTION WITH UPPER ENDO - 04/30/2017 - D. Mariyah Upshaw  Tolerated water  Kept in step down ICU overnight because of multiple medical problems  Looks good this AM.  Needs to ambulate more.  Will start protein drinks.  If tolerated, will discharge later today.  Will keep in stepdown for that reason.  2.  DIABETES MELLITUS TYPE 2 IN OBESE (E11.69)  Now on insulin x 1 1/2 year.  Sees Dr. [name] for this. 3.  OBSTRUCTIVE SLEEP APNEA (G47.33)        On CPAP 4.  VENTRAL INCISIONAL HERNIA (K43.2) Upper incision to the right.  5.  ANTICOAGULATED (Z79.01)             Xarelto on hold  6. Sigmoid colon resection for adenoca, T3, N1 (1/12 nodes) - 02/13/2007 - D. Giani Betzold GI Carlean Purl last colonoscopy 2015 was negative. Oncology - Benay Spice. He did get chemotx. 7. History of ventricular fibrillation - Followed by Dr. Beckie Salts 8. AICD in place. Originally placed in 2004. He said that it has never gone off. Replaced in 2019. 9.  DVT prophylaxis - On SQ heparin   Active Problems:   Morbid obesity with BMI of 50.0-59.9, adult ([facility])   Subjective:  Doing well.  No nausea.   Objective:   Vitals:   05/01/17 0631 05/01/17 0700  BP: (!) 143/58 (!) 156/57  Pulse:    Resp: 17 18  Temp:    SpO2: 98% 92%     Intake/Output from previous day:  04/15 0701 - 04/16 0700 In: 3327.8 [I.V.:2677.8; IV Piggyback:650] Out: 25 [Blood:25]  Intake/Output this shift:  No intake/output data recorded.   Physical Exam:   General: Obese WM who is alert and oriented.    HEENT: Normal. Pupils equal. .   Lungs: Clear   Abdomen: Soft.  Has BS.   Wound: Clean   Lab Results:    Recent Labs    04/30/17 2013 05/01/17 0355  WBC  --  11.0*  HGB  15.4 15.4  HCT 46.2 47.9  PLT  --  142*    BMET  No results for input(s): NA, K, CL, CO2, GLUCOSE, BUN, CREATININE, CALCIUM in the last 72 hours.  PT/INR  No results for input(s): LABPROT, INR in the last 72 hours.  ABG  No results for input(s): PHART, HCO3 in the last 72 hours.  Invalid input(s): PCO2, PO2   Studies/Results:  No results found.   Anti-infectives:   Anti-infectives (From admission, onward)   Start     Dose/Rate Route Frequency Ordered Stop   04/30/17 0951  cefoTEtan in Dextrose 5% (CEFOTAN) IVPB 2 g     2 g Intravenous On call to O.R. 04/30/17 0951 04/30/17 1309      Alphonsa Overall, MD, FACS Pager: [facility] Office: [phone] 05/01/2017

## 2017-05-01 NOTE — Telephone Encounter (Signed)
Usually BID dosing of 70/30 with potential taper in the future depending on his sugars.   Routed to PCP in the meantime.  Have him update [place] about his sugars as he goes along.  Thanks.

## 2017-05-01 NOTE — Progress Notes (Signed)
Patient alert and oriented, pain is controlled. Patient is tolerating fluids, advanced to protein shake today, patient is tolerating well.  Reviewed Gastric sleeve discharge instructions with patient and patient is able to articulate understanding.  Provided information on BELT program, Support Group and [facility] outpatient pharmacy. All questions answered, will continue to monitor.  24 hour oral fluid intake-  540cc Call back per hydration protocol Friday

## 2017-05-01 NOTE — Plan of Care (Signed)

## 2017-05-02 NOTE — Telephone Encounter (Signed)
Okay He can taper down his insulin himself and call for advice as needed

## 2017-05-02 NOTE — Telephone Encounter (Addendum)
Spoke to pt. Fasting 114 this morning. Took 44 units before bed. Sound well. Craig Lindsey he is doing well with the surgery recovery.

## 2017-05-02 NOTE — Telephone Encounter (Signed)
Please check on him today and how his sugars are doing. I did try to call him yesterday at the hospital--but there was no answer.  I expect him to be reducing his insulin over time, but it is hard to predict how quickly

## 2017-05-04 ENCOUNTER — Telehealth (HOSPITAL_COMMUNITY): Payer: Self-pay

## 2017-05-04 NOTE — Telephone Encounter (Signed)
Patient called to discuss post bariatric surgery follow up questions.  See below:   1.  Tell me about your pain and pain management?only taking medication at night nothing during the day.  We discussed using Tylenol   2.  Let's talk about fluid intake.  How much total fluid are you taking in?over 68 ounces of fluids per day, 33 ounces from protein shakes and 35 ounces from water 3.  How much protein have you taken in the last 2 days?90 grams of protein  4.  Have you had nausea?  Tell me about when have experienced nausea and what you did to help?no nausea per patient  5.  Has the frequency or color changed with your urine?urinating frequently no problems  6.  Tell me what your incisions look like?incisions look good  7.  Have you been passing gas? BM?passing gas no bm  8.  If a problem or question were to arise who would you call?  Do you know contact numbers for [facility], [facility], and [facility]?aware of all contact numbers  9.  How has the walking going?walking every hour no difficulties  10.  How are your vitamins and calcium going?  How are you taking them?taking opurity once a day in the evening and 3 calcium no problems with either  **patient shared his blood surgars have been lower, one episode of low blood sugar of 65 drank juice per discharge instructions.  He has been in touch with PCP to discuss insulin dosages and blood sugars.  PCP is managing both.

## 2017-05-06 ENCOUNTER — Encounter: Payer: Self-pay | Admitting: Dietician

## 2017-05-11 ENCOUNTER — Other Ambulatory Visit: Payer: Self-pay | Admitting: Internal Medicine

## 2017-05-14 ENCOUNTER — Encounter: Payer: Self-pay | Admitting: Internal Medicine

## 2017-05-14 ENCOUNTER — Ambulatory Visit (INDEPENDENT_AMBULATORY_CARE_PROVIDER_SITE_OTHER): Payer: Medicare HMO | Admitting: Internal Medicine

## 2017-05-14 VITALS — BP 116/70 | HR 72 | Temp 97.6°F | Ht 73.0 in | Wt >= 6400 oz

## 2017-05-14 DIAGNOSIS — E1140 Type 2 diabetes mellitus with diabetic neuropathy, unspecified: Secondary | ICD-10-CM

## 2017-05-14 DIAGNOSIS — I5032 Chronic diastolic (congestive) heart failure: Secondary | ICD-10-CM

## 2017-05-14 NOTE — Progress Notes (Signed)
Subjective:    Patient ID: Craig Lindsey, male    DOB: 04-14-1960, 57 y.o.   MRN: [id number]  HPI Here for follow up after gastric sleeve resection 2 weeks ago No pain issues--only needed analgesics for 1 day Has already lost ~20#  Only drinking shakes--- 10 ounces at a time 3 a day No hunger Expects to be able to start soft foods soon--meeting with nutritionist  Stopped insulin after the surgery Had a couple of low sugar reactions--so stopped the insulin Sugars still only 75-130 (and most under 120)  Current Outpatient Medications on File Prior to Visit  Medication Sig Dispense Refill  . ACCU-CHEK FASTCLIX LANCETS MISC Use to check blood sugar three times a day.  Dx: E11.40 306 each 3  . ACCU-CHEK SMARTVIEW test strip USE AS INSTRUCTED TO TEST BLOOD SUGAR THREE TIMES DAILY 300 each 12  . amiodarone (PACERONE) 200 MG tablet Take 1 tablet (200 mg total) by mouth daily. 90 tablet 3  . atorvastatin (LIPITOR) 10 MG tablet Take 1 tablet (10 mg total) by mouth daily at 6 PM. 90 tablet 3  . BD ULTRA-FINE PEN NEEDLES 29G X 12.7MM MISC USE  TO INJECT TWICE DAILY  TO THREE TIMES DAILY 270 each 3  . diltiazem (DILT-XR) 240 MG 24 hr capsule Take 1 capsule (240 mg total) by mouth every morning. 90 capsule 3  . furosemide (LASIX) 40 MG tablet Take one tablet by mouth daily and as needed (Patient taking differently: Take 40 mg by mouth daily. ) 135 tablet 3  . glipiZIDE (GLUCOTROL XL) 10 MG 24 hr tablet Take 1 tablet (10 mg total) by mouth daily with breakfast. 90 tablet 3  . hydrALAZINE (APRESOLINE) 25 MG tablet Take 25 mg by mouth 2 (two) times daily.    Marland Kitchen lisinopril (PRINIVIL,ZESTRIL) 40 MG tablet Take 1 tablet (40 mg total) by mouth daily. 90 tablet 3  . metoprolol succinate (TOPROL-XL) 100 MG 24 hr tablet Take 2 tablets (200 mg total) by mouth daily. 180 tablet 3  . multivitamin (THERAGRAN) per tablet Take 1 tablet by mouth daily.      . pantoprazole (PROTONIX) 40 MG tablet Take 40 mg by  mouth daily.  3  . rivaroxaban (XARELTO) 20 MG TABS tablet TAKE 1 TABLET EVERY DAY WITH SUPPER (Patient taking differently: Take 20 mg by mouth daily with supper. ) 90 tablet 3   No current facility-administered medications on file prior to visit.     No Known Allergies  Past Medical History:  Diagnosis Date  . Arrhythmia 12/04   1. Ventricular tachycardia; Dr. [name]  2. Atrial fibrillation  . Arthritis    knees  . Chronic kidney disease    Complex L renal cyst, no tx for   . Diabetes mellitus    Type II  . DVT (deep venous thrombosis) ([place]) 7/09   RLE, Tx Arixtra  . Dysrhythmia    v fib and a fib has icd  . Hemorrhoids    hx of  . Hyperlipidemia   . Hypertension   . 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 Obesity   . Personal history of colon cancer, stage III 1/09   Dr. Carlean Purl, Benay Spice, D. Newman  . Sigmoid colon ulcer 5 yrs ago   Sigmoid; Nydia Bouton, MD (colon cancer)  . Sleep apnea    uses cpap setting of 11.2  . Umbilical hernia     Past Surgical History:  Procedure Laterality Date  .  CARDIAC DEFIBRILLATOR PLACEMENT     2004  . CHOLECYSTECTOMY    . COLECTOMY  1/09   Sigmoid; Nydia Bouton, MD (colon cancer)  . COLONOSCOPY    . COLONOSCOPY N/A 02/25/2013   Procedure: COLONOSCOPY;  Surgeon: Gatha Mayer, MD;  Location: [facility];  Service: Endoscopy;  Laterality: N/A;  . HERNIA REPAIR  yrs ago   umb hernia  . ICD GENERATOR CHANGEOUT N/A 04/12/2017   Procedure: [place];  Surgeon: Evans Lance, MD;  Location: [facility];  Service: Cardiovascular;  Laterality: N/A;  . LAPAROSCOPIC GASTRIC SLEEVE RESECTION     Dr. Lucia Gaskins 04-30-17  . LAPAROSCOPIC GASTRIC SLEEVE RESECTION N/A 04/30/2017   Procedure: LAPAROSCOPIC GASTRIC SLEEVE RESECTION WITH UPPER ENDO;  Surgeon: Alphonsa Overall, MD;  Location: [facility];  Service: General;  Laterality: N/A;    Family History  Problem Relation Age of Onset  . Diabetes Father   . Cancer Father         myelofibrosis  . Colon cancer Other   . Diabetes Maternal Grandmother   . Cancer Maternal Aunt        colon  . Hypertension Neg Hx   . Coronary artery disease Neg Hx   . Prostate cancer Neg Hx     Social History   Socioeconomic History  . Marital status: Married    Spouse name: Not on file  . Number of children: 2  . Years of education: Not on file  . Highest education level: Not on file  Occupational History  . Occupation: Insurance risk surveyor    Comment: Disabled  Social Needs  . Financial resource strain: Not on file  . Food insecurity:    Worry: Not on file    Inability: Not on file  . Transportation needs:    Medical: Not on file    Non-medical: Not on file  Tobacco Use  . Smoking status: Never Smoker  . Smokeless tobacco: Never Used  Substance and Sexual Activity  . Alcohol use: No    Alcohol/week: 0.0 oz  . Drug use: No  . Sexual activity: Yes  Lifestyle  . Physical activity:    Days per week: Not on file    Minutes per session: Not on file  . Stress: Not on file  Relationships  . Social connections:    Talks on phone: Not on file    Gets together: Not on file    Attends religious service: Not on file    Active member of club or organization: Not on file    Attends meetings of clubs or organizations: Not on file    Relationship status: Not on file  . Intimate partner violence:    Fear of current or ex partner: Not on file    Emotionally abused: Not on file    Physically abused: Not on file    Forced sexual activity: Not on file  Other Topics Concern  . Not on file  Social History Narrative   2 step children from wife's 2nd marriage.      Has living will   Wife is health care POA---alternate would be sister, Vickie Epley   Would accept resuscitation attempts   Not sure about tube feedings   Review of Systems No chest pain No SOB    Objective:   Physical Exam  Constitutional: No distress.  Cardiovascular: Normal rate, regular rhythm and  normal heart sounds. Exam reveals no friction rub.  No murmur heard. Pulmonary/Chest: Effort normal  and breath sounds normal. No stridor. No respiratory distress. He has no wheezes. He has no rales.  Musculoskeletal: He exhibits no edema.          Assessment & Plan:

## 2017-05-14 NOTE — Assessment & Plan Note (Signed)
Compensated now Expect we may need to alter medications as he loses weight

## 2017-05-14 NOTE — Assessment & Plan Note (Signed)
Sugars low now  Off insulin If sugars stay down, or more hypoglycemia, will cut the glipizide dose (and maybe even stop) If sugars go up with food, will add low dose lantus

## 2017-05-16 ENCOUNTER — Encounter: Payer: Self-pay | Admitting: Dietician

## 2017-05-16 ENCOUNTER — Encounter: Payer: Medicare HMO | Attending: Surgery | Admitting: Dietician

## 2017-05-16 VITALS — Ht 74.0 in | Wt >= 6400 oz

## 2017-05-16 DIAGNOSIS — Z713 Dietary counseling and surveillance: Secondary | ICD-10-CM | POA: Diagnosis not present

## 2017-05-16 DIAGNOSIS — Z6841 Body Mass Index (BMI) 40.0 and over, adult: Secondary | ICD-10-CM

## 2017-05-16 NOTE — Progress Notes (Signed)
  Follow-up visit:  2 Weeks Post-Operative Sleeve Gastrectomy Surgery  Medical Nutrition Therapy:  Appt start time: 1330 end time:  1430.  Primary concerns today: Post-operative Bariatric Surgery Nutrition Management.  Preferred Learning Style:   No preference indicated   Learning Readiness:   Change in progress  Weight today: 427.8lbs    Height: 6'2" Weight at previous [facility] visit: 453lbs 04/25/17 Weight loss of 25.2lbs    24-hr recall: Breakfast: premier protein shake. Able to drink 1/3 at a atime Snack: sugar free jello; has tried yogurt Lunch: premier protein shake Snack: sugar free jello, sugar free beverages  Dinner: premier protein shake Snack: sugar free jello, sugar free beverages  Fluid intake: 80oz daily Estimated total protein intake: 90g daily  Medications: amiodarone, atorvastatin, diltiazem, furosemide, glipiZIDE, hydrALAZINE, lisinopril, metoprolol succinate, pantoprazole, rivaroxaban Supplementation: bariatric multivitamin 2x daily + calcium carbonate 3x daily  CBG monitoring: 4x daily fasting, before meals and after dinner Average CBG per patient: within normal limits per patient   Using straws: no Drinking while eating: no Hair loss: no Carbonated beverages: no N/V/D/C: no Dumping syndrome: no   Recent physical activity:  Walking daily, some arm exercises with resistance bands  Progress Towards Goal(s):  In progress.  Handouts given during visit include:  Phase III, Phase IV Bariatric diet   Nutritional Diagnosis:  [place]-3.3 Overweight/obesity As related to history of excess calories and inactivity.  As evidenced by patient with BMI of 54.93, following bariatric diet after sleeve gastrectomy surgery. .    Intervention:  Nutrition counseling for post-op bariatric diet.   Patient is following Phase II diet closely, meeting fluid and protein goals, and denies any problems.   Instructed patient on Phase III diet and Phase IV diet. Advised him to follow  Phase III for several weeks before adding in any vegetables from Phase IV.      Teaching Method Utilized:  Visual Auditory Hands on  Barriers to learning/adherence to lifestyle change: none  Demonstrated degree of understanding via:  Teach Back   Monitoring/Evaluation:  Dietary intake, exercise, and body weight. Follow up in 6 weeks for 2 month post-op visit.

## 2017-05-16 NOTE — Patient Instructions (Signed)
   Begin including some soft, solid protein foods, starting with 1/4 cup or 1oz portions and increasing to 2oz as able. Add one food back at a time; chew thoroughly.   Continue to include protein shakes to supplement solid proteins to reach daily protein goal; can use protein shakes for snacks if needed.   Great job following diet guidelines and building up physical activity!

## 2017-05-21 DIAGNOSIS — G4733 Obstructive sleep apnea (adult) (pediatric): Secondary | ICD-10-CM | POA: Diagnosis not present

## 2017-05-25 ENCOUNTER — Other Ambulatory Visit: Payer: Self-pay | Admitting: Internal Medicine

## 2017-05-28 ENCOUNTER — Encounter: Payer: Self-pay | Admitting: Internal Medicine

## 2017-06-01 ENCOUNTER — Encounter: Payer: Self-pay | Admitting: Internal Medicine

## 2017-06-04 DIAGNOSIS — G4733 Obstructive sleep apnea (adult) (pediatric): Secondary | ICD-10-CM | POA: Diagnosis not present

## 2017-06-05 DIAGNOSIS — G4733 Obstructive sleep apnea (adult) (pediatric): Secondary | ICD-10-CM | POA: Diagnosis not present

## 2017-06-08 ENCOUNTER — Encounter: Payer: Self-pay | Admitting: Dietician

## 2017-06-28 ENCOUNTER — Encounter: Payer: Medicare HMO | Attending: Surgery | Admitting: Dietician

## 2017-06-28 ENCOUNTER — Encounter: Payer: Self-pay | Admitting: Dietician

## 2017-06-28 VITALS — Ht 74.0 in | Wt >= 6400 oz

## 2017-06-28 DIAGNOSIS — Z713 Dietary counseling and surveillance: Secondary | ICD-10-CM | POA: Diagnosis not present

## 2017-06-28 DIAGNOSIS — Z6841 Body Mass Index (BMI) 40.0 and over, adult: Secondary | ICD-10-CM

## 2017-06-28 NOTE — Patient Instructions (Signed)
   Continue following the bariatric diet closely by limiting fat, sugar, and carbohydrate intake, as well as controlling food portions.   Keep increasing exercise as able. Eventual goal is at least 150 minutes weekly, ideally 250 minutes or more.

## 2017-06-28 NOTE — Progress Notes (Signed)
Follow-up visit:  2 Month Post-Operative sleeve gastrectomy Surgery  Medical Nutrition Therapy:  Appt start time: 1330 end time:  1400.  Primary concerns today: Post-operative Bariatric Surgery Nutrition Management.  Preferred Learning Style:   No preference indicated   Learning Readiness:   Change in progress  Weight: 409.3lbs Height: 6'2" Weight at previous [facility] visit: 427.8lbs (05/16/17) Weight loss of 18.5lbs; total post-op weight loss about 44lbs.     Dietary recall: Breakfast: egg and 2 [place] sausage 3x week; other days shredded wheat cereal with 1/2 banana Snack: sf popsicle occasionally beef jerky  Lunch: [place] with swiss cheese + fruit; sometimes protein shake Snack: none or same as am  Dinner: chicken, steak, other protein + vegetables; some rice with chicken and veg.  Snack: tried chocolate cake that sister made for him, only ate small amount.   Fluid intake: at least 64oz daily Estimated total protein intake: 70grams  Medications: acetaminophen prn, amiodarone, atorvastatin, diltiazem, furosemide, glipiZIDE, lisinopril, metoprolol, rivaroxaban Supplementation: Bariatric multivitamins 2x daily + calcium citrate 3x daily  Using straws: no Drinking while eating: no Hair loss: no Carbonated beverages: no N/V/D/C: none Dumping syndrome: none   Recent physical activity:  Walking about 2000 steps daily; arm exercises; swimming  Progress Towards Goal(s):  In progress.  Handouts given during visit include:  Bariatric menus for 800-1000kcal   Goals and instructions   Nutritional Diagnosis:  [place]-3.3 Overweight/obesity As related to history of excess calories and inactivity.  As evidenced by patient with BMI of 52.55 in process of weight loss following sleeve gastrectomy surgery.    Intervention:  Nutrition counseling for post-op bariatric diet.    Patient has advanced his diet to include some starchy foods; he reports tolerating well.     Discussed the  importance of strictly limiting carb intake as well as fat and sugar, and concentrating on lean proteins and low-carb vegetables.     Advised avoiding sweets to avoid resuming old habits and regaining weight.     Reviewed the importance of lifelong vitamin supplementation, and ongoing regular exercise.   Teaching Method Utilized:  Visual Auditory   Barriers to learning/adherence to lifestyle change: none  Demonstrated degree of understanding via:  Teach Back   Monitoring/Evaluation:  Dietary intake, exercise, and body weight. Follow up in 4 months for 6 month post-op visit.

## 2017-07-16 ENCOUNTER — Encounter: Payer: Self-pay | Admitting: Internal Medicine

## 2017-07-16 ENCOUNTER — Ambulatory Visit: Payer: Medicare HMO | Admitting: Internal Medicine

## 2017-07-16 VITALS — BP 130/62 | HR 69 | Ht 74.0 in | Wt >= 6400 oz

## 2017-07-16 DIAGNOSIS — Z95810 Presence of automatic (implantable) cardiac defibrillator: Secondary | ICD-10-CM | POA: Diagnosis not present

## 2017-07-16 DIAGNOSIS — I4891 Unspecified atrial fibrillation: Secondary | ICD-10-CM

## 2017-07-16 DIAGNOSIS — I4901 Ventricular fibrillation: Secondary | ICD-10-CM

## 2017-07-16 NOTE — Progress Notes (Signed)
HPI [name] returns today for followup. He is a pleasant 57 yo morbidly obese man, s/p remote VF arrest, PAF on amio who is s/p ICD insertion. He has undergone bariatric surgery and has lost almost 50 lbs. No chest pain or sob. He feels better. No Known Allergies   Current Outpatient Medications  Medication Sig Dispense Refill  . ACCU-CHEK FASTCLIX LANCETS MISC Use to check blood sugar three times a day.  Dx: E11.40 306 each 3  . ACCU-CHEK SMARTVIEW test strip USE AS INSTRUCTED TO TEST BLOOD SUGAR THREE TIMES DAILY 300 each 12  . amiodarone (PACERONE) 200 MG tablet Take 1 tablet (200 mg total) by mouth daily. 90 tablet 3  . atorvastatin (LIPITOR) 10 MG tablet Take 1 tablet (10 mg total) by mouth daily at 6 PM. 90 tablet 3  . BD ULTRA-FINE PEN NEEDLES 29G X 12.7MM MISC USE  TO INJECT TWICE DAILY  TO THREE TIMES DAILY 270 each 3  . Blood Pressure Monitor DEVI     . calcium carbonate (TUMS - DOSED IN MG ELEMENTAL CALCIUM) 500 MG chewable tablet Chew 1 tablet by mouth 3 (three) times daily.    Marland Kitchen diltiazem (DILT-XR) 240 MG 24 hr capsule Take 1 capsule (240 mg total) by mouth every morning. 90 capsule 3  . furosemide (LASIX) 40 MG tablet Take one tablet by mouth daily and as needed (Patient taking differently: Take 40 mg by mouth daily. ) 135 tablet 3  . glipiZIDE (GLUCOTROL XL) 10 MG 24 hr tablet Take 1 tablet (10 mg total) by mouth daily with breakfast. 90 tablet 3  . lisinopril (PRINIVIL,ZESTRIL) 40 MG tablet Take 1 tablet (40 mg total) by mouth daily. 90 tablet 3  . metoprolol succinate (TOPROL-XL) 100 MG 24 hr tablet Take 2 tablets (200 mg total) by mouth daily. 180 tablet 3  . Multiple Vitamins-Minerals (BARIATRIC MULTIVITAMINS/IRON PO) Take by mouth.    . multivitamin (THERAGRAN) per tablet Take 1 tablet by mouth daily.      . ondansetron (ZOFRAN-ODT) 4 MG disintegrating tablet Take 4 mg by mouth every 6 (six) hours as needed. for nausea  0  . oxyCODONE (ROXICODONE) 5 MG/5ML  solution     . rivaroxaban (XARELTO) 20 MG TABS tablet TAKE 1 TABLET EVERY DAY WITH SUPPER (Patient taking differently: Take 20 mg by mouth daily with supper. ) 90 tablet 3   No current facility-administered medications for this visit.      Past Medical History:  Diagnosis Date  . Arrhythmia 12/04   1. Ventricular tachycardia; Dr. [name]  2. Atrial fibrillation  . Arthritis    knees  . Chronic kidney disease    Complex L renal cyst, no tx for   . Diabetes mellitus    Type II  . DVT (deep venous thrombosis) ([place]) 7/09   RLE, Tx Arixtra  . Dysrhythmia    v fib and a fib has icd  . Hemorrhoids    hx of  . Hyperlipidemia   . Hypertension   . 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 Obesity   . Personal history of colon cancer, stage III 1/09   Dr. Carlean Purl, Benay Spice, D. Newman  . Sigmoid colon ulcer 5 yrs ago   Sigmoid; Nydia Bouton, MD (colon cancer)  . Sleep apnea    uses cpap setting of 11.2  . Umbilical hernia     ROS:   All systems reviewed and negative except as noted  in the HPI.   Past Surgical History:  Procedure Laterality Date  . CARDIAC DEFIBRILLATOR PLACEMENT     2004  . CHOLECYSTECTOMY    . COLECTOMY  1/09   Sigmoid; Nydia Bouton, MD (colon cancer)  . COLONOSCOPY    . COLONOSCOPY N/A 02/25/2013   Procedure: COLONOSCOPY;  Surgeon: Gatha Mayer, MD;  Location: [facility];  Service: Endoscopy;  Laterality: N/A;  . HERNIA REPAIR  yrs ago   umb hernia  . ICD GENERATOR CHANGEOUT N/A 04/12/2017   Procedure: [place];  Surgeon: Evans Lance, MD;  Location: [facility];  Service: Cardiovascular;  Laterality: N/A;  . LAPAROSCOPIC GASTRIC SLEEVE RESECTION     Dr. Lucia Gaskins 04-30-17  . LAPAROSCOPIC GASTRIC SLEEVE RESECTION N/A 04/30/2017   Procedure: LAPAROSCOPIC GASTRIC SLEEVE RESECTION WITH UPPER ENDO;  Surgeon: Alphonsa Overall, MD;  Location: [facility];  Service: General;  Laterality: N/A;     Family History  Problem Relation Age of  Onset  . Diabetes Father   . Cancer Father        myelofibrosis  . Colon cancer Other   . Diabetes Maternal Grandmother   . Cancer Maternal Aunt        colon  . Hypertension Neg Hx   . Coronary artery disease Neg Hx   . Prostate cancer Neg Hx      Social History   Socioeconomic History  . Marital status: Married    Spouse name: Not on file  . Number of children: 2  . Years of education: Not on file  . Highest education level: Not on file  Occupational History  . Occupation: Insurance risk surveyor    Comment: Disabled  Social Needs  . Financial resource strain: Not on file  . Food insecurity:    Worry: Not on file    Inability: Not on file  . Transportation needs:    Medical: Not on file    Non-medical: Not on file  Tobacco Use  . Smoking status: Never Smoker  . Smokeless tobacco: Never Used  Substance and Sexual Activity  . Alcohol use: No    Alcohol/week: 0.0 oz  . Drug use: No  . Sexual activity: Yes  Lifestyle  . Physical activity:    Days per week: Not on file    Minutes per session: Not on file  . Stress: Not on file  Relationships  . Social connections:    Talks on phone: Not on file    Gets together: Not on file    Attends religious service: Not on file    Active member of club or organization: Not on file    Attends meetings of clubs or organizations: Not on file    Relationship status: Not on file  . Intimate partner violence:    Fear of current or ex partner: Not on file    Emotionally abused: Not on file    Physically abused: Not on file    Forced sexual activity: Not on file  Other Topics Concern  . Not on file  Social History Narrative   2 step children from wife's 2nd marriage.      Has living will   Wife is health care POA---alternate would be sister, Vickie Epley   Would accept resuscitation attempts   Not sure about tube feedings     BP 130/62   Pulse 69   Ht 6\' 2"  (1.88 m)   Wt (!) 405 lb (183.7 kg)   SpO2 98%  BMI 52.00 kg/m     Physical Exam:  Well appearing NAD HEENT: Unremarkable Neck:  No JVD, no thyromegally Lymphatics:  No adenopathy Back:  No CVA tenderness Lungs:  Clear with no wheezes HEART:  Regular rate rhythm, no murmurs, no rubs, no clicks Abd:  soft, positive bowel sounds, no organomegally, no rebound, no guarding Ext:  2 plus pulses, no edema, no cyanosis, no clubbing Skin:  No rashes no nodules Neuro:  CN II through XII intact, motor grossly intact  EKG - NSR  DEVICE  Normal device function.  See PaceArt for details.   Assess/Plan: 1. PAF - he is maintaining NSR on amio. I hope we can reduce his dose as he loses weight. 2. ICD - his medtronic device is working normally. He has a 6949 lead in place. 3. VF - he has had no recurrent ventricular arrhythmias. 4. Morbid obesity - he has lost 50 lbs. His goal is to lose over a 100 more. I will see him back in 9 months.  [name].D.

## 2017-07-16 NOTE — Patient Instructions (Signed)
Medication Instructions:  Your physician recommends that you continue on your current medications as directed. Please refer to the Current Medication list given to you today.  Labwork: None ordered.  Testing/Procedures: None ordered.  Follow-Up: Your physician wants you to follow-up in: 9 months with Dr. Taylor.   You will receive a reminder letter in the mail two months in advance. If you don't receive a letter, please call our office to schedule the follow-up appointment.  Remote monitoring is used to monitor your ICD from home. This monitoring reduces the number of office visits required to check your device to one time per year. It allows us to keep an eye on the functioning of your device to ensure it is working properly. You are scheduled for a device check from home on 10/15/2017. You may send your transmission at any time that day. If you have a wireless device, the transmission will be sent automatically. After your physician reviews your transmission, you will receive a postcard with your next transmission date.  Any Other Special Instructions Will Be Listed Below (If Applicable).  If you need a refill on your cardiac medications before your next appointment, please call your pharmacy.   

## 2017-07-31 LAB — CUP PACEART INCLINIC DEVICE CHECK
Battery Voltage: 3.11 V
Brady Statistic RV Percent Paced: 5.18 %
Date Time Interrogation Session: 20190701182310
HighPow Impedance: 58 Ohm
HighPow Impedance: 79 Ohm
Implantable Lead Implant Date: 20050113
Implantable Lead Location: 753860
Implantable Lead Model: 6949
Lead Channel Impedance Value: 513 Ohm
Lead Channel Pacing Threshold Amplitude: 1 V
Lead Channel Pacing Threshold Pulse Width: 0.4 ms
Lead Channel Sensing Intrinsic Amplitude: 9.875 mV
Lead Channel Setting Pacing Pulse Width: 0.4 ms
Lead Channel Setting Sensing Sensitivity: 0.3 mV
MDC IDC MSMT BATTERY REMAINING LONGEVITY: 134 mo
MDC IDC MSMT LEADCHNL RV IMPEDANCE VALUE: 551 Ohm
MDC IDC MSMT LEADCHNL RV SENSING INTR AMPL: 11.125 mV
MDC IDC PG IMPLANT DT: 20190328
MDC IDC SET LEADCHNL RV PACING AMPLITUDE: 2.5 V

## 2017-09-13 ENCOUNTER — Encounter: Payer: Self-pay | Admitting: Internal Medicine

## 2017-09-13 ENCOUNTER — Ambulatory Visit (INDEPENDENT_AMBULATORY_CARE_PROVIDER_SITE_OTHER): Payer: Medicare HMO | Admitting: Internal Medicine

## 2017-09-13 VITALS — BP 132/62 | HR 65 | Temp 97.6°F | Ht 74.0 in | Wt 394.0 lb

## 2017-09-13 DIAGNOSIS — I4891 Unspecified atrial fibrillation: Secondary | ICD-10-CM

## 2017-09-13 DIAGNOSIS — I5032 Chronic diastolic (congestive) heart failure: Secondary | ICD-10-CM

## 2017-09-13 DIAGNOSIS — E1140 Type 2 diabetes mellitus with diabetic neuropathy, unspecified: Secondary | ICD-10-CM

## 2017-09-13 DIAGNOSIS — I10 Essential (primary) hypertension: Secondary | ICD-10-CM

## 2017-09-13 LAB — POCT GLYCOSYLATED HEMOGLOBIN (HGB A1C): HEMOGLOBIN A1C: 5.8 % — AB (ref 4.0–5.6)

## 2017-09-13 NOTE — Patient Instructions (Signed)
Please stop the glipizide. Monitor your fasting blood sugar regularly -----if it goes up higher than 140 regularly, I will restart the glipizide at a lower dose (5mg )

## 2017-09-13 NOTE — Assessment & Plan Note (Signed)
BP Readings from Last 3 Encounters:  09/13/17 132/62  07/16/17 130/62  05/14/17 116/70   Good control No further wean

## 2017-09-13 NOTE — Assessment & Plan Note (Addendum)
Seems to have excellent control off the insulin If A1c still low, will cut back on glipizide  Lab Results  Component Value Date   HGBA1C 5.8 (A) 09/13/2017   Will stop glipizide If sugars go way up--will restart at 5mg 

## 2017-09-13 NOTE — Assessment & Plan Note (Signed)
Compensated Edema in legs is clearly better since the bariatric surgery

## 2017-09-13 NOTE — Progress Notes (Signed)
Subjective:    Patient ID: Craig Lindsey, male    DOB: [birth year], 57 y.o.   MRN: [id number]  HPI Here for follow up of diabetes and other chronic health conditions  He notes slow weight loss since surgery Has lost 60# total Has changed his eating--more healthy Had sleeve procedure--no apparent complications  Fasting sugars 90-115 Post prandial often still low No hypoglycemic reactions--since stopping insulin  Recent visit with Dr [name] No heart problems Trying to walk---swimming some in daughter's pool (but hard on the steps there) Monitoring BP--- usually ~130's/65-75 No dizziness or orthostatic symptoms   Current Outpatient Medications on File Prior to Visit  Medication Sig Dispense Refill  . amiodarone (PACERONE) 200 MG tablet Take 1 tablet (200 mg total) by mouth daily. 90 tablet 3  . atorvastatin (LIPITOR) 10 MG tablet Take 1 tablet (10 mg total) by mouth daily at 6 PM. 90 tablet 3  . Blood Pressure Monitor DEVI     . calcium carbonate (TUMS - DOSED IN MG ELEMENTAL CALCIUM) 500 MG chewable tablet Chew 1 tablet by mouth 3 (three) times daily.    Marland Kitchen diltiazem (DILT-XR) 240 MG 24 hr capsule Take 1 capsule (240 mg total) by mouth every morning. 90 capsule 3  . furosemide (LASIX) 40 MG tablet Take one tablet by mouth daily and as needed (Patient taking differently: Take 40 mg by mouth daily. ) 135 tablet 3  . glipiZIDE (GLUCOTROL XL) 10 MG 24 hr tablet Take 1 tablet (10 mg total) by mouth daily with breakfast. 90 tablet 3  . lisinopril (PRINIVIL,ZESTRIL) 40 MG tablet Take 1 tablet (40 mg total) by mouth daily. 90 tablet 3  . metoprolol succinate (TOPROL-XL) 100 MG 24 hr tablet Take 2 tablets (200 mg total) by mouth daily. 180 tablet 3  . Multiple Vitamins-Minerals (BARIATRIC MULTIVITAMINS/IRON PO) Take by mouth.    . multivitamin (THERAGRAN) per tablet Take 1 tablet by mouth daily.      . rivaroxaban (XARELTO) 20 MG TABS tablet TAKE 1 TABLET EVERY DAY WITH SUPPER (Patient  taking differently: Take 20 mg by mouth daily with supper. ) 90 tablet 3  . ACCU-CHEK FASTCLIX LANCETS MISC Use to check blood sugar three times a day.  Dx: E11.40 306 each 3  . ACCU-CHEK SMARTVIEW test strip USE AS INSTRUCTED TO TEST BLOOD SUGAR THREE TIMES DAILY 300 each 12   No current facility-administered medications on file prior to visit.     No Known Allergies  Past Medical History:  Diagnosis Date  . Arrhythmia 12/04   1. Ventricular tachycardia; Dr. [name]  2. Atrial fibrillation  . Arthritis    knees  . Chronic kidney disease    Complex L renal cyst, no tx for   . Diabetes mellitus    Type II  . DVT (deep venous thrombosis) ([place]) 7/09   RLE, Tx Arixtra  . Dysrhythmia    v fib and a fib has icd  . Hemorrhoids    hx of  . Hyperlipidemia   . Hypertension   . 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 Obesity   . Personal history of colon cancer, stage III 1/09   Dr. Carlean Purl, Benay Spice, D. Newman  . Sigmoid colon ulcer 5 yrs ago   Sigmoid; Nydia Bouton, MD (colon cancer)  . Sleep apnea    uses cpap setting of 11.2  . Umbilical hernia     Past Surgical History:  Procedure Laterality Date  . CARDIAC  DEFIBRILLATOR PLACEMENT     2004  . CHOLECYSTECTOMY    . COLECTOMY  1/09   Sigmoid; Nydia Bouton, MD (colon cancer)  . COLONOSCOPY    . COLONOSCOPY N/A 02/25/2013   Procedure: COLONOSCOPY;  Surgeon: Gatha Mayer, MD;  Location: [facility];  Service: Endoscopy;  Laterality: N/A;  . HERNIA REPAIR  yrs ago   umb hernia  . ICD GENERATOR CHANGEOUT N/A 04/12/2017   Procedure: [place];  Surgeon: Evans Lance, MD;  Location: [facility];  Service: Cardiovascular;  Laterality: N/A;  . LAPAROSCOPIC GASTRIC SLEEVE RESECTION     Dr. Lucia Gaskins 04-30-17  . LAPAROSCOPIC GASTRIC SLEEVE RESECTION N/A 04/30/2017   Procedure: LAPAROSCOPIC GASTRIC SLEEVE RESECTION WITH UPPER ENDO;  Surgeon: Alphonsa Overall, MD;  Location: [facility];  Service: General;   Laterality: N/A;    Family History  Problem Relation Age of Onset  . Diabetes Father   . Cancer Father        myelofibrosis  . Colon cancer Other   . Diabetes Maternal Grandmother   . Cancer Maternal Aunt        colon  . Hypertension Neg Hx   . Coronary artery disease Neg Hx   . Prostate cancer Neg Hx     Social History   Socioeconomic History  . Marital status: Married    Spouse name: Not on file  . Number of children: 2  . Years of education: Not on file  . Highest education level: Not on file  Occupational History  . Occupation: Insurance risk surveyor    Comment: Disabled  Social Needs  . Financial resource strain: Not on file  . Food insecurity:    Worry: Not on file    Inability: Not on file  . Transportation needs:    Medical: Not on file    Non-medical: Not on file  Tobacco Use  . Smoking status: Never Smoker  . Smokeless tobacco: Never Used  Substance and Sexual Activity  . Alcohol use: No    Alcohol/week: 0.0 standard drinks  . Drug use: No  . Sexual activity: Yes  Lifestyle  . Physical activity:    Days per week: Not on file    Minutes per session: Not on file  . Stress: Not on file  Relationships  . Social connections:    Talks on phone: Not on file    Gets together: Not on file    Attends religious service: Not on file    Active member of club or organization: Not on file    Attends meetings of clubs or organizations: Not on file    Relationship status: Not on file  . Intimate partner violence:    Fear of current or ex partner: Not on file    Emotionally abused: Not on file    Physically abused: Not on file    Forced sexual activity: Not on file  Other Topics Concern  . Not on file  Social History Narrative   2 step children from wife's 2nd marriage.      Has living will   Wife is health care POA---alternate would be sister, Vickie Epley   Would accept resuscitation attempts   Not sure about tube feedings   Review of Systems Sleeping  okay--but wheezing now due to change in face with his nasal mask (his mouth is dropping open now) Bowels are fine    Objective:   Physical Exam  Constitutional: He appears well-developed. No distress.  Neck:  No thyromegaly present.  Cardiovascular: Normal rate, regular rhythm, normal heart sounds and intact distal pulses. Exam reveals no gallop.  No murmur heard. Respiratory: Effort normal and breath sounds normal. No respiratory distress. He has no wheezes. He has no rales.  Musculoskeletal: He exhibits no edema.  Lymphadenopathy:    He has no cervical adenopathy.  Skin:  No foot lesions  Psychiatric: He has a normal mood and affect. His behavior is normal.           Assessment & Plan:

## 2017-09-13 NOTE — Assessment & Plan Note (Signed)
Regular Paced On the amiodarone and xarelto

## 2017-10-04 ENCOUNTER — Other Ambulatory Visit: Payer: Self-pay | Admitting: Internal Medicine

## 2017-10-04 DIAGNOSIS — E1169 Type 2 diabetes mellitus with other specified complication: Secondary | ICD-10-CM

## 2017-10-04 DIAGNOSIS — E669 Obesity, unspecified: Principal | ICD-10-CM

## 2017-10-09 DIAGNOSIS — G4733 Obstructive sleep apnea (adult) (pediatric): Secondary | ICD-10-CM | POA: Diagnosis not present

## 2017-10-15 ENCOUNTER — Ambulatory Visit (INDEPENDENT_AMBULATORY_CARE_PROVIDER_SITE_OTHER): Payer: Medicare HMO | Admitting: *Deleted

## 2017-10-15 DIAGNOSIS — I4901 Ventricular fibrillation: Secondary | ICD-10-CM

## 2017-10-16 NOTE — Progress Notes (Signed)
Remote ICD transmission.   

## 2017-10-19 LAB — CUP PACEART REMOTE DEVICE CHECK
Battery Remaining Longevity: 132 mo
Brady Statistic RV Percent Paced: 9.72 %
Date Time Interrogation Session: 20190930062824
HIGH POWER IMPEDANCE MEASURED VALUE: 53 Ohm
HIGH POWER IMPEDANCE MEASURED VALUE: 71 Ohm
Implantable Lead Implant Date: 20050113
Lead Channel Impedance Value: 475 Ohm
Lead Channel Pacing Threshold Amplitude: 1.125 V
Lead Channel Pacing Threshold Pulse Width: 0.4 ms
Lead Channel Sensing Intrinsic Amplitude: 9.5 mV
Lead Channel Setting Pacing Amplitude: 2.5 V
MDC IDC LEAD LOCATION: 753860
MDC IDC MSMT BATTERY VOLTAGE: 3.08 V
MDC IDC MSMT LEADCHNL RV IMPEDANCE VALUE: 551 Ohm
MDC IDC MSMT LEADCHNL RV SENSING INTR AMPL: 9.5 mV
MDC IDC PG IMPLANT DT: 20190328
MDC IDC SET LEADCHNL RV PACING PULSEWIDTH: 0.4 ms
MDC IDC SET LEADCHNL RV SENSING SENSITIVITY: 0.3 mV

## 2017-10-30 ENCOUNTER — Other Ambulatory Visit (INDEPENDENT_AMBULATORY_CARE_PROVIDER_SITE_OTHER): Payer: Medicare HMO

## 2017-10-30 ENCOUNTER — Ambulatory Visit: Payer: Medicare HMO

## 2017-10-30 DIAGNOSIS — E1169 Type 2 diabetes mellitus with other specified complication: Secondary | ICD-10-CM | POA: Diagnosis not present

## 2017-10-30 DIAGNOSIS — E669 Obesity, unspecified: Secondary | ICD-10-CM

## 2017-10-30 DIAGNOSIS — Z23 Encounter for immunization: Secondary | ICD-10-CM

## 2017-10-30 LAB — COMPREHENSIVE METABOLIC PANEL
ALT: 26 U/L (ref 0–53)
AST: 16 U/L (ref 0–37)
Albumin: 4 g/dL (ref 3.5–5.2)
Alkaline Phosphatase: 68 U/L (ref 39–117)
BUN: 27 mg/dL — AB (ref 6–23)
CHLORIDE: 105 meq/L (ref 96–112)
CO2: 30 mEq/L (ref 19–32)
Calcium: 9.4 mg/dL (ref 8.4–10.5)
Creatinine, Ser: 1.36 mg/dL (ref 0.40–1.50)
GFR: 57.31 mL/min — ABNORMAL LOW (ref >60.00)
GLUCOSE: 161 mg/dL — AB (ref 70–99)
POTASSIUM: 4.3 meq/L (ref 3.5–5.1)
SODIUM: 139 meq/L (ref 135–145)
Total Bilirubin: 0.6 mg/dL (ref 0.2–1.2)
Total Protein: 7.2 g/dL (ref 6.0–8.3)

## 2017-10-30 LAB — CBC WITH DIFFERENTIAL/PLATELET
BASOS PCT: 0.7 % (ref 0.0–3.0)
Basophils Absolute: 0.1 10*3/uL (ref 0.0–0.1)
EOS ABS: 0.1 10*3/uL (ref 0.0–0.7)
EOS PCT: 1.7 % (ref 0.0–5.0)
HCT: 49.6 % (ref 39.0–52.0)
Hemoglobin: 16.7 g/dL (ref 13.0–17.0)
LYMPHS ABS: 2.3 10*3/uL (ref 0.7–4.0)
Lymphocytes Relative: 31.1 % (ref 12.0–46.0)
MCHC: 33.6 g/dL (ref 30.0–36.0)
MCV: 91.9 fl (ref 78.0–100.0)
MONOS PCT: 7.8 % (ref 3.0–12.0)
Monocytes Absolute: 0.6 10*3/uL (ref 0.1–1.0)
NEUTROS ABS: 4.4 10*3/uL (ref 1.4–7.7)
Neutrophils Relative %: 58.7 % (ref 43.0–77.0)
PLATELETS: 143 10*3/uL — AB (ref 150.0–400.0)
RBC: 5.4 Mil/uL (ref 4.22–5.81)
RDW: 14.1 % (ref 11.5–15.5)
WBC: 7.5 10*3/uL (ref 4.0–10.5)

## 2017-10-30 LAB — FOLATE: Folate: >23.9 ng/mL (ref >5.9)

## 2017-10-30 LAB — VITAMIN B12: VITAMIN B 12: 635 pg/mL (ref 211–911)

## 2017-10-30 LAB — HEMOGLOBIN A1C: HEMOGLOBIN A1C: 6.2 % (ref 4.6–6.5)

## 2017-11-03 LAB — VITAMIN B1: Vitamin B1 (Thiamine): 72 nmol/L — ABNORMAL HIGH (ref 8–30)

## 2017-11-06 DIAGNOSIS — G4733 Obstructive sleep apnea (adult) (pediatric): Secondary | ICD-10-CM | POA: Diagnosis not present

## 2017-11-06 DIAGNOSIS — E1169 Type 2 diabetes mellitus with other specified complication: Secondary | ICD-10-CM | POA: Diagnosis not present

## 2017-11-06 DIAGNOSIS — Z7901 Long term (current) use of anticoagulants: Secondary | ICD-10-CM | POA: Diagnosis not present

## 2017-11-06 DIAGNOSIS — Z903 Acquired absence of stomach [part of]: Secondary | ICD-10-CM | POA: Diagnosis not present

## 2017-11-06 DIAGNOSIS — K432 Incisional hernia without obstruction or gangrene: Secondary | ICD-10-CM | POA: Diagnosis not present

## 2017-12-18 ENCOUNTER — Ambulatory Visit (INDEPENDENT_AMBULATORY_CARE_PROVIDER_SITE_OTHER): Payer: Self-pay | Admitting: Internal Medicine

## 2017-12-18 ENCOUNTER — Encounter: Payer: Self-pay | Admitting: Internal Medicine

## 2017-12-18 VITALS — BP 126/72 | HR 66 | Ht 75.0 in | Wt 388.2 lb

## 2017-12-18 DIAGNOSIS — K439 Ventral hernia without obstruction or gangrene: Secondary | ICD-10-CM

## 2017-12-18 NOTE — Progress Notes (Deleted)
Craig Lindsey 57 y.o. 1960/09/23 [id number]  Assessment & Plan:      Subjective:   Chief Complaint:  HPI  No Known Allergies Current Meds  Medication Sig  . ACCU-CHEK FASTCLIX LANCETS MISC Use to check blood sugar three times a day.  Dx: E11.40  . ACCU-CHEK SMARTVIEW test strip USE AS INSTRUCTED TO TEST BLOOD SUGAR THREE TIMES DAILY  . amiodarone (PACERONE) 200 MG tablet Take 1 tablet (200 mg total) by mouth daily.  Marland Kitchen atorvastatin (LIPITOR) 10 MG tablet Take 1 tablet (10 mg total) by mouth daily at 6 PM.  . Blood Pressure Monitor DEVI   . calcium carbonate (TUMS - DOSED IN MG ELEMENTAL CALCIUM) 500 MG chewable tablet Chew 1 tablet by mouth 3 (three) times daily.  Marland Kitchen diltiazem (DILT-XR) 240 MG 24 hr capsule Take 1 capsule (240 mg total) by mouth every morning.  . furosemide (LASIX) 40 MG tablet Take one tablet by mouth daily and as needed (Patient taking differently: Take 40 mg by mouth daily. )  . lisinopril (PRINIVIL,ZESTRIL) 40 MG tablet Take 1 tablet (40 mg total) by mouth daily.  . metoprolol succinate (TOPROL-XL) 100 MG 24 hr tablet Take 2 tablets (200 mg total) by mouth daily.  . Multiple Vitamins-Minerals (BARIATRIC MULTIVITAMINS/IRON PO) Take by mouth.  . multivitamin (THERAGRAN) per tablet Take 1 tablet by mouth daily.    . rivaroxaban (XARELTO) 20 MG TABS tablet TAKE 1 TABLET EVERY DAY WITH SUPPER (Patient taking differently: Take 20 mg by mouth daily with supper. )   Past Medical History:  Diagnosis Date  . Arrhythmia 12/04   1. Ventricular tachycardia; Dr. [name]  2. Atrial fibrillation  . Arthritis    knees  . Chronic kidney disease    Complex L renal cyst, no tx for   . Diabetes mellitus    Type II  . DVT (deep venous thrombosis) ([place]) 7/09   RLE, Tx Arixtra  . Dysrhythmia    v fib and a fib has icd  . Hemorrhoids    hx of  . Hyperlipidemia   . Hypertension   . 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 Obesity   . Personal history  of colon cancer, stage III 1/09   Dr. Carlean Purl, Benay Spice, D. Newman  . Sigmoid colon ulcer 5 yrs ago   Sigmoid; Nydia Bouton, MD (colon cancer)  . Sleep apnea    uses cpap setting of 11.2  . Umbilical hernia    Past Surgical History:  Procedure Laterality Date  . CARDIAC DEFIBRILLATOR PLACEMENT     2004  . CHOLECYSTECTOMY    . COLECTOMY  1/09   Sigmoid; Nydia Bouton, MD (colon cancer)  . COLONOSCOPY    . COLONOSCOPY N/A 02/25/2013   Procedure: COLONOSCOPY;  Surgeon: Gatha Mayer, MD;  [facility];  Service: Endoscopy;  Laterality: N/A;  . HERNIA REPAIR  yrs ago   umb hernia  . ICD GENERATOR CHANGEOUT N/A 04/12/2017   Procedure: [place];  Surgeon: Evans Lance, MD;  Location: [facility];  Service: Cardiovascular;  Laterality: N/A;  . LAPAROSCOPIC GASTRIC SLEEVE RESECTION     Dr. Lucia Gaskins 04-30-17  . LAPAROSCOPIC GASTRIC SLEEVE RESECTION N/A 04/30/2017   Procedure: LAPAROSCOPIC GASTRIC SLEEVE RESECTION WITH UPPER ENDO;  Surgeon: Alphonsa Overall, MD;  [facility];  Service: General;  Laterality: N/A;   Social History   Social History Narrative  2 step children from wife's 2nd marriage.      Has living will   Wife is health care POA---alternate would be sister, Craig Lindsey   Would accept resuscitation attempts   Not sure about tube feedings   family history includes Cancer in his father and maternal aunt; Colon cancer in his other; Diabetes in his father and maternal grandmother.   Review of Systems   Objective:   Physical Exam

## 2017-12-19 ENCOUNTER — Ambulatory Visit: Payer: Self-pay | Admitting: Dietician

## 2017-12-21 NOTE — Progress Notes (Signed)
   Patient here with ventral hernia.  From chart review and discussion with patient we believe this visit was not intended to occur (referred) so it was cancelled and no charge rendered. He will be due for a repeat colonoscopy next year.  Gatha Mayer, MD, [facility]

## 2017-12-31 ENCOUNTER — Ambulatory Visit: Payer: Self-pay | Admitting: Dietician

## 2018-01-01 ENCOUNTER — Encounter: Payer: Self-pay | Admitting: Dietician

## 2018-01-01 NOTE — Progress Notes (Signed)
Patient cancelled his appointment for 12/31/17, stated he would call back later to reschedule.

## 2018-01-10 DIAGNOSIS — G4733 Obstructive sleep apnea (adult) (pediatric): Secondary | ICD-10-CM | POA: Diagnosis not present

## 2018-01-14 ENCOUNTER — Ambulatory Visit (INDEPENDENT_AMBULATORY_CARE_PROVIDER_SITE_OTHER): Payer: Medicare HMO

## 2018-01-14 DIAGNOSIS — I4901 Ventricular fibrillation: Secondary | ICD-10-CM | POA: Diagnosis not present

## 2018-01-14 NOTE — Progress Notes (Signed)
Remote ICD transmission.   

## 2018-01-15 ENCOUNTER — Encounter: Payer: Self-pay | Admitting: Dietician

## 2018-01-15 LAB — CUP PACEART REMOTE DEVICE CHECK
Battery Remaining Longevity: 131 mo
Battery Voltage: 3.05 V
Brady Statistic RV Percent Paced: 11.21 %
HIGH POWER IMPEDANCE MEASURED VALUE: 52 Ohm
HIGH POWER IMPEDANCE MEASURED VALUE: 65 Ohm
Implantable Lead Implant Date: 20050113
Implantable Pulse Generator Implant Date: 20190328
Lead Channel Impedance Value: 475 Ohm
Lead Channel Impedance Value: 589 Ohm
Lead Channel Sensing Intrinsic Amplitude: 10.75 mV
Lead Channel Setting Pacing Amplitude: 2.5 V
Lead Channel Setting Pacing Pulse Width: 0.4 ms
MDC IDC LEAD LOCATION: 753860
MDC IDC MSMT LEADCHNL RV PACING THRESHOLD AMPLITUDE: 1 V
MDC IDC MSMT LEADCHNL RV PACING THRESHOLD PULSEWIDTH: 0.4 ms
MDC IDC MSMT LEADCHNL RV SENSING INTR AMPL: 10.75 mV
MDC IDC SESS DTM: 20191230083425
MDC IDC SET LEADCHNL RV SENSING SENSITIVITY: 0.3 mV

## 2018-01-15 NOTE — Progress Notes (Signed)
Have not heard back from patient to reschedule his cancelled appointment from 12/31/17. Sent letter to referring provider.

## 2018-01-17 ENCOUNTER — Telehealth: Payer: Self-pay

## 2018-01-17 NOTE — Telephone Encounter (Signed)
-----   Message from Gatha Mayer, MD sent at 01/17/2018 12:39 PM EST ----- Regarding: Needs appt Please schedule a f/u w/ me in February so I can arrange his colonoscopy  (will be hosp case and I need to hold Xarelto)  ----- Message ----- From: Alphonsa Overall, MD Sent: 12/21/2017  11:06 AM EST To: Gatha Mayer, MD Subject: RE: ? if I missed something                    Craig Lindsey, Yes, he has a ventral hernia that we are talking about fixing when he gets his weight down to 300 pounds or below.  He is over half way there from where he started.  He has done well from his sleeve gastrectomy.  Since he had colon cancer and his colonoscopy is soon - I just wanted the colonoscopy done before the ventral hernia repair, just in case something is found.  Talk to you later, Craig Lindsey  ----- Message ----- From: Gatha Mayer, MD Sent: 12/18/2017   4:45 PM EST To: Alphonsa Overall, MD Subject: ? if I missed something                        He showed for an appointment today but neither I nor he could figure out if I needed to do anything.,  Routine colonoscopy due early next year and he has a ventral hernia.  I read your note and did not see that you wanted me to see him from that.  Question if there was some misunderstanding with the staff.  Craig Lindsey

## 2018-01-17 NOTE — Telephone Encounter (Signed)
Left message for patient to call back  

## 2018-01-18 NOTE — Telephone Encounter (Signed)
Left message for patient to call back  

## 2018-01-22 NOTE — Telephone Encounter (Signed)
Patient scheduled for 02/25/18 2:00

## 2018-02-25 ENCOUNTER — Telehealth: Payer: Self-pay

## 2018-02-25 ENCOUNTER — Ambulatory Visit: Payer: Medicare HMO | Admitting: Internal Medicine

## 2018-02-25 ENCOUNTER — Encounter: Payer: Self-pay | Admitting: Internal Medicine

## 2018-02-25 VITALS — BP 138/66 | HR 49 | Ht 73.0 in | Wt 381.0 lb

## 2018-02-25 DIAGNOSIS — Z86010 Personal history of colonic polyps: Secondary | ICD-10-CM

## 2018-02-25 DIAGNOSIS — K439 Ventral hernia without obstruction or gangrene: Secondary | ICD-10-CM | POA: Diagnosis not present

## 2018-02-25 DIAGNOSIS — Z85038 Personal history of other malignant neoplasm of large intestine: Secondary | ICD-10-CM

## 2018-02-25 DIAGNOSIS — I4891 Unspecified atrial fibrillation: Secondary | ICD-10-CM | POA: Diagnosis not present

## 2018-02-25 DIAGNOSIS — Z7901 Long term (current) use of anticoagulants: Secondary | ICD-10-CM | POA: Diagnosis not present

## 2018-02-25 NOTE — Patient Instructions (Signed)
   Let us weigh you in March and see what the BMI is then. We will go ahead and get clearance to hold your xarelto prior to your colonoscopy procedure from Dr Cristopher Peru.    I appreciate the opportunity to care for you. Silvano Rusk, MD, [facility]

## 2018-02-25 NOTE — Telephone Encounter (Signed)
[facility] Pre-operative Risk Assessment     Request for surgical clearance:     Endoscopy Procedure  What type of surgery is being performed?     colonoscopy  When is this surgery scheduled?     Hopefully in March, trying to get BMI down  What type of clearance is required ?   Pharmacy  Are there any medications that need to be held prior to surgery and how long? Xarelto, 2 days  Practice name and name of physician performing surgery?      [facility]  What is your office phone and fax number?      Phone- [phone]  [phone]  Anesthesia type (None, local, MAC, general) ?       MAC

## 2018-02-25 NOTE — Progress Notes (Signed)
ANTWIONE PICOTTE 58 y.o. 02-13-1960 [id number]  Assessment & Plan:   Encounter Diagnoses  Name Primary?  . Personal history of colon cancer Yes  . Personal history of colonic polyps   . Anticoagulant long-term use - Xarelto   . Atrial fibrillation, unspecified type ([place])   . Ventral hernia without obstruction or gangrene      He is almost below the BMI of 50 which would allow him to be scoped in our endoscopy center.  He is just at 5 years I think we have some time to wait, so he will come back in late March to be weighed.  If his BMI is under 50 then we can schedule in the endoscopy center rather than at the hospital.  He does have a history of atrial fibrillation and is on Xarelto and the extra rare but real risk of stroke off of Xarelto was explained and he understands and agrees to proceed.  We would hold that 2 days before and we will query his cardiologist about that.  He has an AICD also but his echo cardiogram shows an ejection fraction of 55 to 60% last year.   I appreciate the opportunity to care for this patient. Copy to Dr. Alphonsa Overall Subjective:   Chief Complaint: History of colon cancer and history of colon polyps  HPI  The patient is here for follow-up, I had seen him in the fall but he was here prematurely for a routine colonoscopy at 5 years after last due to a history of colon polyps and colon cancer (2009).  He is losing weight and is awaiting, at some point, repair of ventral hernia.  Dr. Lucia Gaskins wanted him to have his colonoscopy prior to that.  I am not sure he is met his weight goal either.  After an initial significant rapid drop off in weight things have leveled off a little bit. Wt Readings from Last 3 Encounters:  02/25/18 (!) 381 lb (172.8 kg)  12/18/17 (!) 388 lb 4 oz (176.1 kg)  09/13/17 (!) 394 lb (178.7 kg)     No Known Allergies Current Meds  Medication Sig  . ACCU-CHEK FASTCLIX LANCETS MISC Use to check blood sugar three times a day.  Dx:  E11.40  . ACCU-CHEK SMARTVIEW test strip USE AS INSTRUCTED TO TEST BLOOD SUGAR THREE TIMES DAILY  . amiodarone (PACERONE) 200 MG tablet Take 1 tablet (200 mg total) by mouth daily.  Marland Kitchen atorvastatin (LIPITOR) 10 MG tablet Take 1 tablet (10 mg total) by mouth daily at 6 PM.  . Blood Pressure Monitor DEVI   . calcium carbonate (TUMS - DOSED IN MG ELEMENTAL CALCIUM) 500 MG chewable tablet Chew 1 tablet by mouth 2 (two) times daily.   Marland Kitchen diltiazem (DILT-XR) 240 MG 24 hr capsule Take 1 capsule (240 mg total) by mouth every morning.  . furosemide (LASIX) 40 MG tablet Take one tablet by mouth daily and as needed (Patient taking differently: Take 40 mg by mouth daily. )  . lisinopril (PRINIVIL,ZESTRIL) 40 MG tablet Take 1 tablet (40 mg total) by mouth daily.  . metoprolol succinate (TOPROL-XL) 100 MG 24 hr tablet Take 2 tablets (200 mg total) by mouth daily.  . Multiple Vitamins-Minerals (BARIATRIC MULTIVITAMINS/IRON PO) Take by mouth.  . rivaroxaban (XARELTO) 20 MG TABS tablet TAKE 1 TABLET EVERY DAY WITH SUPPER (Patient taking differently: Take 20 mg by mouth daily with supper. )   Past Medical History:  Diagnosis Date  . Arrhythmia 12/04  1. Ventricular tachycardia; Dr. [name]  2. Atrial fibrillation  . Arthritis    knees  . Chronic kidney disease    Complex L renal cyst, no tx for   . Diabetes mellitus    Type II  . DVT (deep venous thrombosis) ([place]) 7/09   RLE, Tx Arixtra  . Dysrhythmia    v fib and a fib has icd  . Hemorrhoids    hx of  . Hyperlipidemia   . Hypertension   . 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 Obesity   . Personal history of colon cancer, stage III 1/09   Dr. Carlean Purl, Benay Spice, D. Newman  . Sigmoid colon ulcer 5 yrs ago   Sigmoid; Nydia Bouton, MD (colon cancer)  . Sleep apnea    uses cpap setting of 11.2  . Umbilical hernia    Past Surgical History:  Procedure Laterality Date  . CARDIAC DEFIBRILLATOR PLACEMENT     2004  . CHOLECYSTECTOMY    .  COLECTOMY  1/09   Sigmoid; Nydia Bouton, MD (colon cancer)  . COLONOSCOPY    . COLONOSCOPY N/A 02/25/2013   Procedure: COLONOSCOPY;  Surgeon: Gatha Mayer, MD;  [facility];  Service: Endoscopy;  Laterality: N/A;  . HERNIA REPAIR  yrs ago   umb hernia  . ICD GENERATOR CHANGEOUT N/A 04/12/2017   Procedure: [place];  Surgeon: Evans Lance, MD;  Location: [place] [facility];  Service: Cardiovascular;  Laterality: N/A;  . LAPAROSCOPIC GASTRIC SLEEVE RESECTION     Dr. Lucia Gaskins 04-30-17  . LAPAROSCOPIC GASTRIC SLEEVE RESECTION N/A 04/30/2017   Procedure: LAPAROSCOPIC GASTRIC SLEEVE RESECTION WITH UPPER ENDO;  Surgeon: Alphonsa Overall, MD;  Location: [facility];  Service: General;  Laterality: N/A;   Social History   Social History Narrative   2 step children from wife's 2nd marriage.      Has living will   Wife is health care POA---alternate would be sister, Vickie Epley   Would accept resuscitation attempts   Not sure about tube feedings   family history includes Cancer in his father and maternal aunt; Colon cancer in an other family member; Diabetes in his father and maternal grandmother.   Review of Systems See HPI  Objective:   Physical Exam BP 138/66   Pulse (!) 49   Ht '6\' 1"'  (1.854 m) Comment: measured without shoes  Wt (!) 381 lb (172.8 kg)   BMI 50.27 kg/m  Very pleasant obese white man in no acute distress Lungs clear Heart sounds are very distant The abdomen is soft obese and there is a moderate ventral hernia to the right of the midline around the umbilicus but lateral to it soft and reducible.  No tenderness. Extremities show compression hose bilaterally in the lower extremities He is alert and oriented x3

## 2018-02-27 DIAGNOSIS — H524 Presbyopia: Secondary | ICD-10-CM | POA: Diagnosis not present

## 2018-02-27 LAB — HM DIABETES EYE EXAM

## 2018-02-27 NOTE — Telephone Encounter (Signed)
Note routed to [facility] via Epic.

## 2018-02-27 NOTE — Telephone Encounter (Signed)
Patient informed and verbalized understanding holding his xarelto for 2 days before his procedure that we hope to get on the books for the lec late March. He will come back in March and weigh and we will go from there.

## 2018-02-27 NOTE — Telephone Encounter (Signed)
See recommendation by clinical pharmacist and forward to requesting provider

## 2018-02-27 NOTE — Telephone Encounter (Signed)
Patient with diagnosis of Afib with history of DVT (2009) on Xarelto for anticoagulation.    Procedure: colonoscopy Date of procedure: March 2020  CHADS2-VASc score of  3 (CHF, HTN, AGE, DM2, stroke/tia x 2, CAD, AGE, male)  CrCl 179ml/min  Per office protocol, patient can hold Xarelto for 2 days prior to procedure.  With history of DVT would recommend resume anticoagulation as soon as safe post procedure.

## 2018-03-11 ENCOUNTER — Ambulatory Visit (INDEPENDENT_AMBULATORY_CARE_PROVIDER_SITE_OTHER): Payer: Medicare HMO | Admitting: Internal Medicine

## 2018-03-11 ENCOUNTER — Encounter: Payer: Self-pay | Admitting: Internal Medicine

## 2018-03-11 VITALS — BP 110/64 | HR 60 | Temp 97.6°F | Ht 73.5 in | Wt 377.0 lb

## 2018-03-11 DIAGNOSIS — N183 Chronic kidney disease, stage 3 unspecified: Secondary | ICD-10-CM

## 2018-03-11 DIAGNOSIS — I5032 Chronic diastolic (congestive) heart failure: Secondary | ICD-10-CM

## 2018-03-11 DIAGNOSIS — D696 Thrombocytopenia, unspecified: Secondary | ICD-10-CM | POA: Diagnosis not present

## 2018-03-11 DIAGNOSIS — Z0000 Encounter for general adult medical examination without abnormal findings: Secondary | ICD-10-CM | POA: Diagnosis not present

## 2018-03-11 DIAGNOSIS — E1140 Type 2 diabetes mellitus with diabetic neuropathy, unspecified: Secondary | ICD-10-CM

## 2018-03-11 DIAGNOSIS — Z6841 Body Mass Index (BMI) 40.0 and over, adult: Secondary | ICD-10-CM | POA: Diagnosis not present

## 2018-03-11 DIAGNOSIS — Z7189 Other specified counseling: Secondary | ICD-10-CM | POA: Diagnosis not present

## 2018-03-11 LAB — POCT GLYCOSYLATED HEMOGLOBIN (HGB A1C): Hemoglobin A1C: 6.7 % — AB (ref 4.0–5.6)

## 2018-03-11 LAB — HM DIABETES FOOT EXAM

## 2018-03-11 NOTE — Assessment & Plan Note (Signed)
Is on ACEI stable

## 2018-03-11 NOTE — Assessment & Plan Note (Signed)
Compensated now No changes needed 

## 2018-03-11 NOTE — Assessment & Plan Note (Addendum)
Still seems to have good control Will check A1c  Lab Results  Component Value Date   HGBA1C 6.7 (A) 03/11/2018   No changes needed

## 2018-03-11 NOTE — Assessment & Plan Note (Signed)
Borderline No evidence of bleeding No action needed

## 2018-03-11 NOTE — Assessment & Plan Note (Signed)
See social history 

## 2018-03-11 NOTE — Assessment & Plan Note (Signed)
I have personally reviewed the Medicare Annual Wellness questionnaire and have noted 1. The patient's medical and social history 2. Their use of alcohol, tobacco or illicit drugs 3. Their current medications and supplements 4. The patient's functional ability including ADL's, fall risks, home safety risks and hearing or visual             impairment. 5. Diet and physical activities 6. Evidence for depression or mood disorders  The patients weight, height, BMI and visual acuity have been recorded in the chart I have made referrals, counseling and provided education to the patient based review of the above and I have provided the pt with a written personalized care plan for preventive services.  I have provided you with a copy of your personalized plan for preventive services. Please take the time to review along with your updated medication list.  Due for repeat colon soon Will defer PSA Doing better with lifestyle Consider shingrix--at pharmacy Yearly flu vaccine

## 2018-03-11 NOTE — Assessment & Plan Note (Signed)
Has lost weight BMI now just under 50

## 2018-03-11 NOTE — Progress Notes (Signed)
Subjective:    Patient ID: Craig Lindsey, male    DOB: 08-04-1960, 58 y.o.   MRN: [id number]  HPI Here for Medicare wellness visit and follow up of chronic health conditions Reviewed form and advanced directives Reviewed other doctors Vision and hearing are fine No tobacco or alcohol No falls No depression or anhedonia Independent with instrumental ADLs No sig memory issues  Recent GI visit Scheduling colonoscopy soon Then has ventral hernia to be repaired  Has lost about 80# since the surgery BMI now under 50 Walking every day---joined gym  Knee getting better  Checks sugars regularly 115-122 fasting Still with same numbness in feet--occasional balance issues Now getting along without cane Still with GFR in 50's  Reviewed platelets in 140Ks No abnormal bleeding  No chest pain No palpitations No dizziness or syncope No SOB Sleeps flat--no PND No edema  Current Outpatient Medications on File Prior to Visit  Medication Sig Dispense Refill  . ACCU-CHEK FASTCLIX LANCETS MISC Use to check blood sugar three times a day.  Dx: E11.40 306 each 3  . ACCU-CHEK SMARTVIEW test strip USE AS INSTRUCTED TO TEST BLOOD SUGAR THREE TIMES DAILY 300 each 12  . amiodarone (PACERONE) 200 MG tablet Take 1 tablet (200 mg total) by mouth daily. 90 tablet 3  . atorvastatin (LIPITOR) 10 MG tablet Take 1 tablet (10 mg total) by mouth daily at 6 PM. 90 tablet 3  . Blood Pressure Monitor DEVI     . calcium carbonate (TUMS - DOSED IN MG ELEMENTAL CALCIUM) 500 MG chewable tablet Chew 1 tablet by mouth 2 (two) times daily.     Marland Kitchen diltiazem (DILT-XR) 240 MG 24 hr capsule Take 1 capsule (240 mg total) by mouth every morning. 90 capsule 3  . furosemide (LASIX) 40 MG tablet Take one tablet by mouth daily and as needed (Patient taking differently: Take 40 mg by mouth daily. ) 135 tablet 3  . lisinopril (PRINIVIL,ZESTRIL) 40 MG tablet Take 1 tablet (40 mg total) by mouth daily. 90 tablet 3  .  metoprolol succinate (TOPROL-XL) 100 MG 24 hr tablet Take 2 tablets (200 mg total) by mouth daily. 180 tablet 3  . Multiple Vitamins-Minerals (BARIATRIC MULTIVITAMINS/IRON PO) Take by mouth.    . rivaroxaban (XARELTO) 20 MG TABS tablet TAKE 1 TABLET EVERY DAY WITH SUPPER (Patient taking differently: Take 20 mg by mouth daily with supper. ) 90 tablet 3   No current facility-administered medications on file prior to visit.     No Known Allergies  Past Medical History:  Diagnosis Date  . Arrhythmia 12/04   1. Ventricular tachycardia; Dr. [name]  2. Atrial fibrillation  . Arthritis    knees  . Chronic kidney disease    Complex L renal cyst, no tx for   . Diabetes mellitus    Type II  . DVT (deep venous thrombosis) ([place]) 7/09   RLE, Tx Arixtra  . Dysrhythmia    v fib and a fib has icd  . Hemorrhoids    hx of  . Hyperlipidemia   . Hypertension   . 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 Obesity   . Personal history of colon cancer, stage III 1/09   Dr. Carlean Purl, Benay Spice, D. Newman  . Sigmoid colon ulcer 5 yrs ago   Sigmoid; Nydia Bouton, MD (colon cancer)  . Sleep apnea    uses cpap setting of 11.2  . Umbilical hernia     Past Surgical  History:  Procedure Laterality Date  . CARDIAC DEFIBRILLATOR PLACEMENT     2004  . CHOLECYSTECTOMY    . COLECTOMY  1/09   Sigmoid; Nydia Bouton, MD (colon cancer)  . COLONOSCOPY    . COLONOSCOPY N/A 02/25/2013   Procedure: COLONOSCOPY;  Surgeon: Gatha Mayer, MD;  Location: [facility];  Service: Endoscopy;  Laterality: N/A;  . HERNIA REPAIR  yrs ago   umb hernia  . ICD GENERATOR CHANGEOUT N/A 04/12/2017   Procedure: Bernie;  Surgeon: Evans Lance, MD;  Location: [facility];  Service: Cardiovascular;  Laterality: N/A;  . LAPAROSCOPIC GASTRIC SLEEVE RESECTION     Dr. Lucia Gaskins 04-30-17  . LAPAROSCOPIC GASTRIC SLEEVE RESECTION N/A 04/30/2017   Procedure: LAPAROSCOPIC GASTRIC SLEEVE RESECTION WITH UPPER ENDO;   Surgeon: Alphonsa Overall, MD;  Location: [facility];  Service: General;  Laterality: N/A;    Family History  Problem Relation Age of Onset  . Diabetes Father   . Cancer Father        myelofibrosis  . Colon cancer Other   . Diabetes Maternal Grandmother   . Cancer Maternal Aunt        colon  . Hypertension Neg Hx   . Coronary artery disease Neg Hx   . Prostate cancer Neg Hx     Social History   Socioeconomic History  . Marital status: Married    Spouse name: Not on file  . Number of children: 2  . Years of education: Not on file  . Highest education level: Not on file  Occupational History  . Occupation: Insurance risk surveyor    Comment: Disabled  Social Needs  . Financial resource strain: Not on file  . Food insecurity:    Worry: Not on file    Inability: Not on file  . Transportation needs:    Medical: Not on file    Non-medical: Not on file  Tobacco Use  . Smoking status: Never Smoker  . Smokeless tobacco: Never Used  Substance and Sexual Activity  . Alcohol use: No    Alcohol/week: 0.0 standard drinks  . Drug use: No  . Sexual activity: Yes  Lifestyle  . Physical activity:    Days per week: Not on file    Minutes per session: Not on file  . Stress: Not on file  Relationships  . Social connections:    Talks on phone: Not on file    Gets together: Not on file    Attends religious service: Not on file    Active member of club or organization: Not on file    Attends meetings of clubs or organizations: Not on file    Relationship status: Not on file  . Intimate partner violence:    Fear of current or ex partner: Not on file    Emotionally abused: Not on file    Physically abused: Not on file    Forced sexual activity: Not on file  Other Topics Concern  . Not on file  Social History Narrative   2 step children from wife's 2nd marriage.      Has living will   Wife is health care POA---alternate would be sister, Vickie Epley   Would accept resuscitation attempts     Not sure about tube feedings   Review of Systems Sleeps well on the CPAP Wears seat belt Teeth are fine---keeps up with dentist No suspicious skin lesions--doesn't see derm Bowels are fine No heartburn or dysphagia  Objective:   Physical Exam  Constitutional: He is oriented to person, place, and time. No distress.  HENT:  Mouth/Throat: Oropharynx is clear and moist. No oropharyngeal exudate.  Neck: No thyromegaly present.  Cardiovascular: Normal rate, regular rhythm, normal heart sounds and intact distal pulses. Exam reveals no gallop.  No murmur heard. Respiratory: Effort normal and breath sounds normal. No respiratory distress. He has no wheezes. He has no rales.  GI: Soft. There is no abdominal tenderness.  Musculoskeletal:        General: No tenderness or edema.  Lymphadenopathy:    He has no cervical adenopathy.  Neurological: He is alert and oriented to person, place, and time.  President-- "Trump, Obama, Clinton---Bush" [name] Recall 2/3  Decreased sensation in feet  Skin: No rash noted. No erythema.  No foot lesions  Psychiatric: He has a normal mood and affect. His behavior is normal.           Assessment & Plan:

## 2018-03-11 NOTE — Progress Notes (Signed)
Hearing Screening   Method: Audiometry   125Hz  250Hz  500Hz  1000Hz  2000Hz  3000Hz  4000Hz  6000Hz  8000Hz   Right ear:   20 20 20  20     Left ear:   20 20 20  20     Vision Screening Comments: February 2020

## 2018-03-12 MED ORDER — METOPROLOL SUCCINATE ER 100 MG PO TB24: 200.0000 mg | ORAL_TABLET | Freq: Every day | ORAL | 3 refills | Status: DC

## 2018-03-12 MED ORDER — RIVAROXABAN 20 MG PO TABS: ORAL_TABLET | ORAL | 3 refills | Status: DC

## 2018-03-12 MED ORDER — AMIODARONE HCL 200 MG PO TABS: 200.0000 mg | ORAL_TABLET | Freq: Every day | ORAL | 3 refills | Status: DC

## 2018-03-12 MED ORDER — LISINOPRIL 40 MG PO TABS: 40.0000 mg | ORAL_TABLET | Freq: Every day | ORAL | 3 refills | Status: DC

## 2018-03-12 MED ORDER — DILTIAZEM HCL ER 240 MG PO CP24: 240.0000 mg | ORAL_CAPSULE | Freq: Every morning | ORAL | 3 refills | Status: DC

## 2018-03-12 MED ORDER — ATORVASTATIN CALCIUM 10 MG PO TABS: 10.0000 mg | ORAL_TABLET | Freq: Every day | ORAL | 3 refills | Status: DC

## 2018-04-04 ENCOUNTER — Telehealth: Payer: Self-pay | Admitting: Cardiology

## 2018-04-04 NOTE — Telephone Encounter (Signed)
Patient called and stated that he heard an alert tone from his device. He said it sounded like sirens. He sent a remote transmission last night after he heard the alert tone. Informed pt that I would send a message to Device Tech RN and she will review transmission and someone will call him back with the results.

## 2018-04-04 NOTE — Telephone Encounter (Signed)
Normal device function, no episodes. No alerts on device.   Chanetta Marshall, NP 04/04/2018 10:14 AM

## 2018-04-04 NOTE — Telephone Encounter (Signed)
Informed pt of Chanetta Marshall, NP recommendations. Pt verbalized understanding.

## 2018-04-09 ENCOUNTER — Encounter: Payer: Self-pay | Admitting: Internal Medicine

## 2018-04-15 ENCOUNTER — Other Ambulatory Visit: Payer: Self-pay

## 2018-04-15 ENCOUNTER — Ambulatory Visit (INDEPENDENT_AMBULATORY_CARE_PROVIDER_SITE_OTHER): Payer: Medicare HMO | Admitting: *Deleted

## 2018-04-15 DIAGNOSIS — I472 Ventricular tachycardia, unspecified: Secondary | ICD-10-CM

## 2018-04-15 DIAGNOSIS — I4901 Ventricular fibrillation: Secondary | ICD-10-CM | POA: Diagnosis not present

## 2018-04-15 LAB — CUP PACEART REMOTE DEVICE CHECK
Battery Remaining Longevity: 128 mo
Battery Voltage: 3.04 V
Brady Statistic RV Percent Paced: 14.55 %
Date Time Interrogation Session: 20200330052405
HIGH POWER IMPEDANCE MEASURED VALUE: 53 Ohm
HighPow Impedance: 69 Ohm
Implantable Lead Implant Date: 20050113
Implantable Lead Location: 753860
Implantable Lead Model: 6949
Lead Channel Impedance Value: 475 Ohm
Lead Channel Impedance Value: 608 Ohm
Lead Channel Pacing Threshold Pulse Width: 0.4 ms
Lead Channel Sensing Intrinsic Amplitude: 10.625 mV
Lead Channel Sensing Intrinsic Amplitude: 10.625 mV
Lead Channel Setting Pacing Amplitude: 2.5 V
Lead Channel Setting Pacing Pulse Width: 0.4 ms
Lead Channel Setting Sensing Sensitivity: 0.3 mV
MDC IDC MSMT LEADCHNL RV PACING THRESHOLD AMPLITUDE: 0.875 V
MDC IDC PG IMPLANT DT: 20190328

## 2018-04-16 ENCOUNTER — Encounter: Payer: Self-pay | Admitting: Internal Medicine

## 2018-04-25 NOTE — Progress Notes (Signed)
Remote ICD transmission.   

## 2018-04-30 ENCOUNTER — Encounter: Payer: Medicare HMO | Admitting: Internal Medicine

## 2018-06-11 ENCOUNTER — Other Ambulatory Visit: Payer: Self-pay | Admitting: Internal Medicine

## 2018-06-11 DIAGNOSIS — I5032 Chronic diastolic (congestive) heart failure: Secondary | ICD-10-CM

## 2018-06-17 ENCOUNTER — Encounter: Payer: Self-pay | Admitting: Internal Medicine

## 2018-06-17 ENCOUNTER — Other Ambulatory Visit: Payer: Self-pay

## 2018-06-17 ENCOUNTER — Ambulatory Visit (INDEPENDENT_AMBULATORY_CARE_PROVIDER_SITE_OTHER): Payer: Medicare HMO | Admitting: Internal Medicine

## 2018-06-17 VITALS — Ht 74.0 in | Wt 377.0 lb

## 2018-06-17 DIAGNOSIS — Z7901 Long term (current) use of anticoagulants: Secondary | ICD-10-CM

## 2018-06-17 DIAGNOSIS — I4891 Unspecified atrial fibrillation: Secondary | ICD-10-CM

## 2018-06-17 DIAGNOSIS — Z85038 Personal history of other malignant neoplasm of large intestine: Secondary | ICD-10-CM

## 2018-06-17 DIAGNOSIS — Z86010 Personal history of colonic polyps: Secondary | ICD-10-CM

## 2018-06-17 DIAGNOSIS — Z95810 Presence of automatic (implantable) cardiac defibrillator: Secondary | ICD-10-CM

## 2018-06-17 NOTE — Progress Notes (Signed)
TELEHEALTH ENCOUNTER IN SETTING OF COVID-19 PANDEMIC - REQUESTED BY PATIENT SERVICE PROVIDED BY TELEMEDECINE - TYPE:Zoom AV PATIENT LOCATION: Home PATIENT HAS CONSENTED TO TELEHEALTH VISIT PROVIDER LOCATION: OFFICE REFERRING PROVIDER N/A PARTICIPANTS OTHER THAN PATIENT:none TIME SPENT ON CALL:5 mins    Craig Lindsey 58 y.o. [birth year] [id number]  Assessment & Plan:   Encounter Diagnoses  Name Primary?  . Personal history of colon cancer Yes  . Personal history of colonic polyps   . Long term current use of anticoagulant   . Atrial fibrillation, unspecified type ([place])   . AICD (automatic cardioverter/defibrillator) present    Plan for colonoscopy in the [facility] end[facility] center, will hold Xarelto 2 days prior to procedure and clarify with cardiology regarding that.  We have actually already had that clearance previously.  He is not having any symptoms or any changes so I feel comfortable continuing with that plan.  He has BMI is now below 50 so he is a candidate for the [place], he does have an AICD but his ejection fraction was 55 to 60% on echocardiogram in 2019.The risks and benefits as well as alternatives of endoscopic procedure(s) have been discussed and reviewed. All questions answered. The patient agrees to proceed. Rare but real risk of stroke off Xarelto has been explained and he understands and agrees to proceed.  Additionally he is aware that we are trying to minimize the risk of COVID-19 transmission by being in our facility but we cannot guarantee that could not occur.  I appreciate the opportunity to care for this patient. CC: Venia Carbon, MD   Subjective:   Chief Complaint: History of colon polyps and colon cancer on Xarelto  HPI Craig Lindsey has a personal history of colon cancer and a history of an adenomatous colon polyp as well as a ventral hernia awaiting repair.  He takes Xarelto because of atrial fibrillation.  It is an appropriate time for him to have  a surveillance colonoscopy, a little over 5 years since his last.  He was seen late last year and at that point his BMI was higher than 50 but he has had a gastrointestinal sleeve procedure and is losing weight and his BMI is now below 50.  Thus he can have his procedure in our ambulatory surgery center.  Wt Readings from Last 3 Encounters:  06/17/18 (!) 377 lb (171 kg)  03/11/18 (!) 377 lb (171 kg)  02/25/18 (!) 381 lb (172.8 kg)    No Known Allergies Current Meds  Medication Sig  . ACCU-CHEK FASTCLIX LANCETS MISC Use to check blood sugar three times a day.  Dx: E11.40  . ACCU-CHEK SMARTVIEW test strip USE AS INSTRUCTED TO TEST BLOOD SUGAR THREE TIMES DAILY  . amiodarone (PACERONE) 200 MG tablet Take 1 tablet (200 mg total) by mouth daily.  Marland Kitchen atorvastatin (LIPITOR) 10 MG tablet Take 1 tablet (10 mg total) by mouth daily at 6 PM.  . Blood Pressure Monitor DEVI   . calcium carbonate (TUMS - DOSED IN MG ELEMENTAL CALCIUM) 500 MG chewable tablet Chew 1 tablet by mouth 2 (two) times daily.   Marland Kitchen diltiazem (DILT-XR) 240 MG 24 hr capsule Take 1 capsule (240 mg total) by mouth every morning.  . furosemide (LASIX) 40 MG tablet Take 1 tablet (40 mg total) by mouth daily.  Marland Kitchen lisinopril (PRINIVIL,ZESTRIL) 40 MG tablet Take 1 tablet (40 mg total) by mouth daily.  . metoprolol succinate (TOPROL-XL) 100 MG 24 hr tablet Take 2 tablets (200  mg total) by mouth daily.  . Multiple Vitamins-Minerals (BARIATRIC MULTIVITAMINS/IRON PO) Take by mouth.  . rivaroxaban (XARELTO) 20 MG TABS tablet TAKE 1 TABLET EVERY DAY WITH SUPPER   Past Medical History:  Diagnosis Date  . Arrhythmia 12/04   1. Ventricular tachycardia; Dr. [name]  2. Atrial fibrillation  . Arthritis    knees  . Chronic kidney disease    Complex L renal cyst, no tx for   . Diabetes mellitus    Type II  . DVT (deep venous thrombosis) ([place]) 7/09   RLE, Tx Arixtra  . Dysrhythmia    v fib and a fib has icd  . Hemorrhoids    hx of  .  Hyperlipidemia   . Hypertension   . 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 Obesity   . Personal history of colon cancer, stage III 1/09   Dr. Carlean Purl, Benay Spice, D. Newman  . Sigmoid colon ulcer 5 yrs ago   Sigmoid; Nydia Bouton, MD (colon cancer)  . Sleep apnea    uses cpap setting of 11.2  . Umbilical hernia    Past Surgical History:  Procedure Laterality Date  . CARDIAC DEFIBRILLATOR PLACEMENT     2004  . CHOLECYSTECTOMY    . COLECTOMY  1/09   Sigmoid; Nydia Bouton, MD (colon cancer)  . COLONOSCOPY    . COLONOSCOPY N/A 02/25/2013   Procedure: COLONOSCOPY;  Surgeon: Gatha Mayer, MD;  Location: [facility];  Service: Endoscopy;  Laterality: N/A;  . HERNIA REPAIR  yrs ago   umb hernia  . ICD GENERATOR CHANGEOUT N/A 04/12/2017   Procedure: [place];  Surgeon: Evans Lance, MD;  Location: [facility];  Service: Cardiovascular;  Laterality: N/A;  . LAPAROSCOPIC GASTRIC SLEEVE RESECTION     Dr. Lucia Gaskins 04-30-17  . LAPAROSCOPIC GASTRIC SLEEVE RESECTION N/A 04/30/2017   Procedure: LAPAROSCOPIC GASTRIC SLEEVE RESECTION WITH UPPER ENDO;  Surgeon: Alphonsa Overall, MD;  Location: [facility];  Service: General;  Laterality: N/A;   Social History   Social History Narrative   2 step children from wife's 2nd marriage.      Has living will   Wife is health care POA---alternate would be sister, Craig Lindsey   Would accept resuscitation attempts   Not sure about tube feedings   family history includes Cancer in his father and maternal aunt; Colon cancer in an other family member; Diabetes in his father and maternal grandmother.   Review of Systems As per HPI.

## 2018-06-17 NOTE — Patient Instructions (Addendum)
  Good to see you and know that things are ok.Your BMI is now ok to have the colonoscopy in our endoscopy center.  We will arrange for your colonoscopy due to personal history of colon cancer and colon polyps. You will hold your xarelto 2 days before - we have advice on that from cardiology clinic from February and since you are stable and no new problems we can go ahead with that recommendation.  I appreciate the opportunity to care for you. Gatha Mayer, MD, [facility]

## 2018-07-04 MED ORDER — GLIPIZIDE ER 2.5 MG PO TB24: 2.5000 mg | ORAL_TABLET | Freq: Every day | ORAL | 3 refills | Status: DC

## 2018-07-09 DIAGNOSIS — G4733 Obstructive sleep apnea (adult) (pediatric): Secondary | ICD-10-CM | POA: Diagnosis not present

## 2018-07-12 ENCOUNTER — Telehealth: Payer: Self-pay | Admitting: Internal Medicine

## 2018-07-12 NOTE — Telephone Encounter (Signed)
Left message to call back regarding Covid-19 screening questions Covid-19 Screening Questions:  Do you now or have you had a fever in the last 14 days?   Do you have any respiratory symptoms of shortness of breath or cough now or in the last 14 days?   Do you have any family members or close contacts with diagnosed or suspected Covid-19 in the past 14 days?   Have you been tested for Covid-19 and found to be positive?

## 2018-07-12 NOTE — Telephone Encounter (Signed)
No to all answer are no

## 2018-07-15 ENCOUNTER — Ambulatory Visit (INDEPENDENT_AMBULATORY_CARE_PROVIDER_SITE_OTHER): Payer: Medicare HMO | Admitting: *Deleted

## 2018-07-15 ENCOUNTER — Encounter: Payer: Self-pay | Admitting: Internal Medicine

## 2018-07-15 ENCOUNTER — Ambulatory Visit (AMBULATORY_SURGERY_CENTER): Payer: Medicare HMO | Admitting: Internal Medicine

## 2018-07-15 ENCOUNTER — Other Ambulatory Visit: Payer: Self-pay

## 2018-07-15 VITALS — BP 125/59 | HR 59 | Temp 97.7°F | Resp 22 | Ht 74.0 in | Wt 377.0 lb

## 2018-07-15 DIAGNOSIS — D128 Benign neoplasm of rectum: Secondary | ICD-10-CM

## 2018-07-15 DIAGNOSIS — I4901 Ventricular fibrillation: Secondary | ICD-10-CM | POA: Diagnosis not present

## 2018-07-15 DIAGNOSIS — Z85038 Personal history of other malignant neoplasm of large intestine: Secondary | ICD-10-CM | POA: Diagnosis not present

## 2018-07-15 MED ORDER — SODIUM CHLORIDE 0.9 % IV SOLN: 500.0000 mL | Freq: Once | INTRAVENOUS | Status: DC

## 2018-07-15 NOTE — Op Note (Signed)
[facility] Patient Name: Craig Lindsey Procedure Date: 07/15/2018 10:49 AM MRN: [id number] Endoscopist: Gatha Mayer , MD Age: 58 Referring MD:  Date of Birth: 11-Sep-1960 Gender: Male Account #: [id number] Procedure:                Colonoscopy Indications:              High risk colon cancer surveillance: Personal                            history of colon cancer sigmoid resection 2009 Medicines:                Propofol per Anesthesia, Monitored Anesthesia Care Procedure:                Pre-Anesthesia Assessment:                           - Prior to the procedure, a History and Physical                            was performed, and patient medications and                            allergies were reviewed. The patient's tolerance of                            previous anesthesia was also reviewed. The risks                            and benefits of the procedure and the sedation                            options and risks were discussed with the patient.                            All questions were answered, and informed consent                            was obtained. Prior Anticoagulants: The patient has                            taken no previous anticoagulant or antiplatelet                            agents. ASA Grade Assessment: III - A patient with                            severe systemic disease. After reviewing the risks                            and benefits, the patient was deemed in                            satisfactory condition to undergo the procedure.  After obtaining informed consent, the colonoscope                            was passed under direct vision. Throughout the                            procedure, the patient's blood pressure, pulse, and                            oxygen saturations were monitored continuously. The                            Colonoscope was introduced through the anus and           advanced to the the cecum, identified by                            appendiceal orifice and ileocecal valve. The                            colonoscopy was performed without difficulty. The                            patient tolerated the procedure well. The quality                            of the bowel preparation was adequate. The                            ileocecal valve, appendiceal orifice, and rectum                            were photographed. The bowel preparation used was                            Miralax via split dose instruction. Scope In: 10:58:06 AM Scope Out: 11:14:35 AM Scope Withdrawal Time: 0 hours 13 minutes 39 seconds  Total Procedure Duration: 0 hours 16 minutes 29 seconds  Findings:                 Two sessile polyps were found in the rectum. The                            polyps were diminutive in size. These polyps were                            removed with a cold snare. Resection and retrieval                            were complete. Verification of patient                            identification for the specimen was done. Estimated  blood loss was minimal.                           There was evidence of a prior end-to-end                            colo-colonic anastomosis in the sigmoid colon. This                            was patent and was characterized by healthy                            appearing mucosa. The anastomosis was traversed.                           The exam was otherwise without abnormality on                            direct and retroflexion views. Complications:            No immediate complications. Estimated Blood Loss:     Estimated blood loss was minimal. Impression:               - Two diminutive polyps in the rectum, removed with                            a cold snare. Resected and retrieved.                           - Patent end-to-end colo-colonic anastomosis,                             characterized by healthy appearing mucosa.                           - The examination was otherwise normal on direct                            and retroflexion views.                           - Personal history of malignant neoplasm of the                            colon. Sigmoid - 2009 resection Recommendation:           - Patient has a contact number available for                            emergencies. The signs and symptoms of potential                            delayed complications were discussed with the                            patient. Return to normal activities tomorrow.  Written discharge instructions were provided to the                            patient.                           - Resume previous diet.                           - Continue present medications.                           - Resume Xarelto (rivaroxaban) at prior dose                            tomorrow.                           - Repeat colonoscopy is recommended for                            surveillance. The colonoscopy date will be                            determined after pathology results from today's                            exam become available for review. Gatha Mayer, MD 07/15/2018 11:22:36 AM This report has been signed electronically.

## 2018-07-15 NOTE — Patient Instructions (Addendum)
I found and removed 2 tiny polyps.  I will let you know pathology results and when to have another routine colonoscopy by mail and/or My Chart.  Expect it will be another 5 years.  Resume Xarelto tomorrow please.  I appreciate the opportunity to care for you. Gatha Mayer, MD, [facility]  Information on polyps given to you today    YOU HAD AN ENDOSCOPIC PROCEDURE TODAY AT [facility]:   Refer to the procedure report that was given to you for any specific questions about what was found during the examination.  If the procedure report does not answer your questions, please call your gastroenterologist to clarify.  If you requested that your care partner not be given the details of your procedure findings, then the procedure report has been included in a sealed envelope for you to review at your convenience later.  YOU SHOULD EXPECT: Some feelings of bloating in the abdomen. Passage of more gas than usual.  Walking can help get rid of the air that was put into your GI tract during the procedure and reduce the bloating. If you had a lower endoscopy (such as a colonoscopy or flexible sigmoidoscopy) you may notice spotting of blood in your stool or on the toilet paper. If you underwent a bowel prep for your procedure, you may not have a normal bowel movement for a few days.  Please Note:  You might notice some irritation and congestion in your nose or some drainage.  This is from the oxygen used during your procedure.  There is no need for concern and it should clear up in a day or so.  SYMPTOMS TO REPORT IMMEDIATELY:   Following lower endoscopy (colonoscopy or flexible sigmoidoscopy):  Excessive amounts of blood in the stool  Significant tenderness or worsening of abdominal pains  Swelling of the abdomen that is new, acute  Fever of 100F or higher    For urgent or emergent issues, a gastroenterologist can be reached at any hour by calling [phone].   DIET:   We do recommend a small meal at first, but then you may proceed to your regular diet.  Drink plenty of fluids but you should avoid alcoholic beverages for 24 hours.  ACTIVITY:  You should plan to take it easy for the rest of today and you should NOT DRIVE or use heavy machinery until tomorrow (because of the sedation medicines used during the test).    FOLLOW UP: Our staff will call the number listed on your records 48-72 hours following your procedure to check on you and address any questions or concerns that you may have regarding the information given to you following your procedure. If we do not reach you, we will leave a message.  We will attempt to reach you two times.  During this call, we will ask if you have developed any symptoms of COVID 19. If you develop any symptoms (ie: fever, flu-like symptoms, shortness of breath, cough etc.) before then, please call [phone].  If you test positive for Covid 19 in the 2 weeks post procedure, please call and report this information to [place].    If any biopsies were taken you will be contacted by phone or by letter within the next 1-3 weeks.  Please call us at [phone] if you have not heard about the biopsies in 3 weeks.    SIGNATURES/CONFIDENTIALITY: You and/or your care partner have signed paperwork which will be entered into your electronic  medical record.  These signatures attest to the fact that that the information above on your After Visit Summary has been reviewed and is understood.  Full responsibility of the confidentiality of this discharge information lies with you and/or your care-partner.

## 2018-07-15 NOTE — Progress Notes (Signed)
Pt's states no medical or surgical changes since previsit or office visit. 

## 2018-07-15 NOTE — Progress Notes (Signed)
Called to room to assist during endoscopic procedure.  Patient ID and intended procedure confirmed with present staff. Received instructions for my participation in the procedure from the performing physician.  

## 2018-07-15 NOTE — Progress Notes (Signed)
PT taken to PACU. Monitors in place. VSS. Report given to RN. 

## 2018-07-16 LAB — CUP PACEART REMOTE DEVICE CHECK
Battery Remaining Longevity: 126 mo
Battery Voltage: 3.03 V
Brady Statistic RV Percent Paced: 14.33 %
Date Time Interrogation Session: 20200629052504
HighPow Impedance: 62 Ohm
HighPow Impedance: 81 Ohm
Implantable Lead Implant Date: 20050113
Implantable Lead Location: 753860
Implantable Lead Model: 6949
Implantable Pulse Generator Implant Date: 20190328
Lead Channel Impedance Value: 513 Ohm
Lead Channel Impedance Value: 646 Ohm
Lead Channel Pacing Threshold Amplitude: 1.125 V
Lead Channel Pacing Threshold Pulse Width: 0.4 ms
Lead Channel Sensing Intrinsic Amplitude: 11.5 mV
Lead Channel Sensing Intrinsic Amplitude: 11.5 mV
Lead Channel Setting Pacing Amplitude: 2.5 V
Lead Channel Setting Pacing Pulse Width: 0.4 ms
Lead Channel Setting Sensing Sensitivity: 0.3 mV

## 2018-07-17 ENCOUNTER — Encounter: Payer: Self-pay | Admitting: Internal Medicine

## 2018-07-17 ENCOUNTER — Telehealth: Payer: Self-pay

## 2018-07-17 NOTE — Telephone Encounter (Signed)
First post procedure follow up call, no answer 

## 2018-07-17 NOTE — Telephone Encounter (Signed)
  Follow up Call-  Call back number 07/15/2018  Post procedure Call Back phone  # [phone]  Permission to leave phone message Yes  Some recent data might be hidden     Patient questions:  Do you have a fever, pain , or abdominal swelling? No. Pain Score  0 *  Have you tolerated food without any problems? Yes.    Have you been able to return to your normal activities? Yes.    Do you have any questions about your discharge instructions: Diet   No. Medications  No. Follow up visit  No.  Do you have questions or concerns about your Care? No.  Actions: * If pain score is 4 or above: No action needed, pain <4.  1. Have you developed a fever since your procedure? no  2.   Have you had an respiratory symptoms (SOB or cough) since your procedure? no  3.   Have you tested positive for COVID 19 since your procedure no  4.   Have you had any family members/close contacts diagnosed with the COVID 19 since your procedure?  no   If yes to any of these questions please route to Joylene John, RN and Alphonsa Gin, Therapist, sports.

## 2018-07-17 NOTE — Progress Notes (Signed)
Diminutive adenoma and diminutive hyperplastic rectal polyps Recall colonoscopy 2025 MyChart letter

## 2018-07-27 ENCOUNTER — Encounter: Payer: Self-pay | Admitting: Cardiology

## 2018-07-27 NOTE — Progress Notes (Signed)
Remote ICD transmission.   

## 2018-09-06 ENCOUNTER — Ambulatory Visit (INDEPENDENT_AMBULATORY_CARE_PROVIDER_SITE_OTHER): Payer: Medicare HMO | Admitting: Internal Medicine

## 2018-09-06 ENCOUNTER — Other Ambulatory Visit: Payer: Self-pay

## 2018-09-06 ENCOUNTER — Encounter: Payer: Self-pay | Admitting: Internal Medicine

## 2018-09-06 VITALS — BP 140/76 | HR 69 | Ht 74.0 in | Wt 376.0 lb

## 2018-09-06 DIAGNOSIS — I5032 Chronic diastolic (congestive) heart failure: Secondary | ICD-10-CM | POA: Diagnosis not present

## 2018-09-06 DIAGNOSIS — I472 Ventricular tachycardia, unspecified: Secondary | ICD-10-CM

## 2018-09-06 DIAGNOSIS — I4901 Ventricular fibrillation: Secondary | ICD-10-CM | POA: Diagnosis not present

## 2018-09-06 DIAGNOSIS — Z95810 Presence of automatic (implantable) cardiac defibrillator: Secondary | ICD-10-CM | POA: Diagnosis not present

## 2018-09-06 LAB — CUP PACEART INCLINIC DEVICE CHECK
Battery Remaining Longevity: 125 mo
Battery Voltage: 3 V
Brady Statistic RV Percent Paced: 12.6 %
Date Time Interrogation Session: 20200821142430
HighPow Impedance: 59 Ohm
HighPow Impedance: 76 Ohm
Implantable Lead Implant Date: 20050113
Implantable Lead Location: 753860
Implantable Lead Model: 6949
Implantable Pulse Generator Implant Date: 20190328
Lead Channel Impedance Value: 456 Ohm
Lead Channel Impedance Value: 646 Ohm
Lead Channel Pacing Threshold Amplitude: 1.125 V
Lead Channel Pacing Threshold Pulse Width: 0.4 ms
Lead Channel Sensing Intrinsic Amplitude: 11.75 mV
Lead Channel Sensing Intrinsic Amplitude: 12.375 mV
Lead Channel Setting Pacing Amplitude: 2.5 V
Lead Channel Setting Pacing Pulse Width: 0.4 ms
Lead Channel Setting Sensing Sensitivity: 0.3 mV

## 2018-09-06 NOTE — Patient Instructions (Signed)
Your physician recommends that you continue on your current medications as directed. Please refer to the Current Medication list given to you today.   Your physician wants you to follow-up in: YEAR WITH DR TAYLOR  You will receive a reminder letter in the mail two months in advance. If you don't receive a letter, please call our office to schedule the follow-up appointment.  

## 2018-09-06 NOTE — Progress Notes (Signed)
HPI Craig Lindsey returns today for followup. He is a pleasant 58 yo morbidly obese man, s/p remote VF arrest, PAF on amio who is s/p ICD insertion. He has undergone bariatric surgery and has lost almost 30 lbs since his last visit. He would like to lose more weight. No Known Allergies   Current Outpatient Medications  Medication Sig Dispense Refill  . ACCU-CHEK FASTCLIX LANCETS MISC Use to check blood sugar three times a day.  Dx: E11.40 306 each 3  . ACCU-CHEK SMARTVIEW test strip USE AS INSTRUCTED TO TEST BLOOD SUGAR THREE TIMES DAILY 300 each 12  . amiodarone (PACERONE) 200 MG tablet Take 1 tablet (200 mg total) by mouth daily. 90 tablet 3  . atorvastatin (LIPITOR) 10 MG tablet Take 1 tablet (10 mg total) by mouth daily at 6 PM. 90 tablet 3  . Blood Pressure Monitor DEVI     . calcium carbonate (TUMS - DOSED IN MG ELEMENTAL CALCIUM) 500 MG chewable tablet Chew 1 tablet by mouth 2 (two) times daily.     Marland Kitchen diltiazem (DILT-XR) 240 MG 24 hr capsule Take 1 capsule (240 mg total) by mouth every morning. 90 capsule 3  . furosemide (LASIX) 40 MG tablet Take 1 tablet (40 mg total) by mouth daily. 135 tablet 3  . glipiZIDE (GLUCOTROL XL) 2.5 MG 24 hr tablet Take 1 tablet (2.5 mg total) by mouth daily with breakfast. 90 tablet 3  . lisinopril (PRINIVIL,ZESTRIL) 40 MG tablet Take 1 tablet (40 mg total) by mouth daily. 90 tablet 3  . metoprolol succinate (TOPROL-XL) 100 MG 24 hr tablet Take 2 tablets (200 mg total) by mouth daily. 180 tablet 3  . Multiple Vitamins-Minerals (BARIATRIC MULTIVITAMINS/IRON PO) Take by mouth.    . rivaroxaban (XARELTO) 20 MG TABS tablet TAKE 1 TABLET EVERY DAY WITH SUPPER 90 tablet 3   No current facility-administered medications for this visit.      Past Medical History:  Diagnosis Date  . Arrhythmia 12/04   1. Ventricular tachycardia; Dr. [name]  2. Atrial fibrillation  . Arthritis    knees  . Chronic kidney disease    Complex L renal cyst, no tx for   .  Diabetes mellitus    Type II  . DVT (deep venous thrombosis) ([place]) 7/09   RLE, Tx Arixtra  . Dysrhythmia    v fib and a fib has icd  . Hemorrhoids    hx of  . Hyperlipidemia   . Hypertension   . 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 Obesity   . Personal history of colon cancer, stage III 1/09   Dr. Carlean Purl, Benay Spice, D. Newman  . Sigmoid colon ulcer 5 yrs ago   Sigmoid; Nydia Bouton, MD (colon cancer)  . Sleep apnea    uses cpap setting of 11.2  . Umbilical hernia     ROS:   All systems reviewed and negative except as noted in the HPI.   Past Surgical History:  Procedure Laterality Date  . CARDIAC DEFIBRILLATOR PLACEMENT     2004  . CHOLECYSTECTOMY    . COLECTOMY  1/09   Sigmoid; Nydia Bouton, MD (colon cancer)  . COLONOSCOPY    . COLONOSCOPY N/A 02/25/2013   Procedure: COLONOSCOPY;  Surgeon: Gatha Mayer, MD;  Location: [facility];  Service: Endoscopy;  Laterality: N/A;  . HERNIA REPAIR  yrs ago   umb hernia  . ICD GENERATOR CHANGEOUT N/A 04/12/2017   Procedure:  ICD GENERATOR CHANGEOUT;  Surgeon: Evans Lance, MD;  Location: [place] [facility];  Service: Cardiovascular;  Laterality: N/A;  . LAPAROSCOPIC GASTRIC SLEEVE RESECTION     Dr. Lucia Gaskins 04-30-17  . LAPAROSCOPIC GASTRIC SLEEVE RESECTION N/A 04/30/2017   Procedure: LAPAROSCOPIC GASTRIC SLEEVE RESECTION WITH UPPER ENDO;  Surgeon: Alphonsa Overall, MD;  Location: [facility];  Service: General;  Laterality: N/A;     Family History  Problem Relation Age of Onset  . Diabetes Father   . Cancer Father        myelofibrosis  . Colon cancer Other   . Diabetes Maternal Grandmother   . Cancer Maternal Aunt        colon  . Hypertension Neg Hx   . Coronary artery disease Neg Hx   . Prostate cancer Neg Hx   . Esophageal cancer Neg Hx   . Rectal cancer Neg Hx   . Stomach cancer Neg Hx      Social History   Socioeconomic History  . Marital status: Married    Spouse name: Not on file  . Number of  children: 2  . Years of education: Not on file  . Highest education level: Not on file  Occupational History  . Occupation: disabled    Comment: Disabled  Social Needs  . Financial resource strain: Not on file  . Food insecurity    Worry: Not on file    Inability: Not on file  . Transportation needs    Medical: Not on file    Non-medical: Not on file  Tobacco Use  . Smoking status: Never Smoker  . Smokeless tobacco: Never Used  Substance and Sexual Activity  . Alcohol use: No    Alcohol/week: 0.0 standard drinks  . Drug use: No  . Sexual activity: Yes  Lifestyle  . Physical activity    Days per week: Not on file    Minutes per session: Not on file  . Stress: Not on file  Relationships  . Social Herbalist on phone: Not on file    Gets together: Not on file    Attends religious service: Not on file    Active member of club or organization: Not on file    Attends meetings of clubs or organizations: Not on file    Relationship status: Not on file  . Intimate partner violence    Fear of current or ex partner: Not on file    Emotionally abused: Not on file    Physically abused: Not on file    Forced sexual activity: Not on file  Other Topics Concern  . Not on file  Social History Narrative   2 step children from wife's 2nd marriage.      Has living will   Wife is health care POA---alternate would be sister, Vickie Epley   Would accept resuscitation attempts   Not sure about tube feedings     BP 140/76   Pulse 69   Ht 6\' 2"  (1.88 m)   Wt (!) 376 lb (170.6 kg)   SpO2 98%   BMI 48.28 kg/m   Physical Exam:  Morbidly obese appearing NAD HEENT: Unremarkable Neck:  No JVD, no thyromegally Lymphatics:  No adenopathy Back:  No CVA tenderness Lungs:  Clear with no wheezes HEART:  Regular rate rhythm, no murmurs, no rubs, no clicks Abd:  soft, positive bowel sounds, no organomegally, no rebound, no guarding Ext:  2 plus pulses, no edema, no cyanosis,  no clubbing Skin:  No rashes no nodules Neuro:  CN II through XII intact, motor grossly intact  EKG - NSR with LBBB  DEVICE  Normal device function.  See PaceArt for details.   Assess/Plan: 1. Obesity - we discussed the importance of persistence and dietary restriction.  2. VF - he has had no recurrent episodes. 3. PAF - he is maintaining NSR. No change in his meds. 4. Diastolic heart failure - his symptoms are class 2. Weight loss, a low sodium diet,k and continued medical therapy have been discussed in detail.  [name].D.

## 2018-09-12 ENCOUNTER — Encounter: Payer: Self-pay | Admitting: Internal Medicine

## 2018-09-12 ENCOUNTER — Ambulatory Visit (INDEPENDENT_AMBULATORY_CARE_PROVIDER_SITE_OTHER): Payer: Medicare HMO | Admitting: Internal Medicine

## 2018-09-12 ENCOUNTER — Other Ambulatory Visit: Payer: Self-pay

## 2018-09-12 VITALS — BP 118/76 | HR 62 | Temp 98.6°F | Ht 74.0 in | Wt 373.0 lb

## 2018-09-12 DIAGNOSIS — E1140 Type 2 diabetes mellitus with diabetic neuropathy, unspecified: Secondary | ICD-10-CM

## 2018-09-12 DIAGNOSIS — D696 Thrombocytopenia, unspecified: Secondary | ICD-10-CM

## 2018-09-12 DIAGNOSIS — Z23 Encounter for immunization: Secondary | ICD-10-CM | POA: Diagnosis not present

## 2018-09-12 NOTE — Progress Notes (Addendum)
Subjective:    Patient ID: Craig Lindsey, male    DOB: 1960-08-23, 58 y.o.   MRN: [id number]  HPI Here for follow up of diabetes and other conditions  Sugars are better now on the glipizide Checks twice a day---discussed cutting down  May have had 1 mild low sugar reaction in past 6 months (registered 95 and mild GI symptoms)  Has leveled out in weight loss Down ~100# from maximum before the bariatric surgery Eating much less Trying to swim regularly and doing some walking (but limited after he twisted his knee) Joined gym--but now closed Does have resistance bands  No chest pain No SOB No dizziness or syncope  Current Outpatient Medications on File Prior to Visit  Medication Sig Dispense Refill  . ACCU-CHEK FASTCLIX LANCETS MISC Use to check blood sugar three times a day.  Dx: E11.40 306 each 3  . ACCU-CHEK SMARTVIEW test strip USE AS INSTRUCTED TO TEST BLOOD SUGAR THREE TIMES DAILY 300 each 12  . amiodarone (PACERONE) 200 MG tablet Take 1 tablet (200 mg total) by mouth daily. 90 tablet 3  . atorvastatin (LIPITOR) 10 MG tablet Take 1 tablet (10 mg total) by mouth daily at 6 PM. 90 tablet 3  . Blood Pressure Monitor DEVI     . calcium carbonate (TUMS - DOSED IN MG ELEMENTAL CALCIUM) 500 MG chewable tablet Chew 1 tablet by mouth 2 (two) times daily.     Marland Kitchen diltiazem (DILT-XR) 240 MG 24 hr capsule Take 1 capsule (240 mg total) by mouth every morning. 90 capsule 3  . furosemide (LASIX) 40 MG tablet Take 1 tablet (40 mg total) by mouth daily. 135 tablet 3  . glipiZIDE (GLUCOTROL XL) 2.5 MG 24 hr tablet Take 1 tablet (2.5 mg total) by mouth daily with breakfast. 90 tablet 3  . lisinopril (PRINIVIL,ZESTRIL) 40 MG tablet Take 1 tablet (40 mg total) by mouth daily. 90 tablet 3  . metoprolol succinate (TOPROL-XL) 100 MG 24 hr tablet Take 2 tablets (200 mg total) by mouth daily. 180 tablet 3  . Multiple Vitamins-Minerals (BARIATRIC MULTIVITAMINS/IRON PO) Take by mouth.    . rivaroxaban  (XARELTO) 20 MG TABS tablet TAKE 1 TABLET EVERY DAY WITH SUPPER 90 tablet 3   No current facility-administered medications on file prior to visit.     No Known Allergies  Past Medical History:  Diagnosis Date  . Arrhythmia 12/04   1. Ventricular tachycardia; Dr. [name]  2. Atrial fibrillation  . Arthritis    knees  . Chronic kidney disease    Complex L renal cyst, no tx for   . Diabetes mellitus    Type II  . DVT (deep venous thrombosis) ([place]) 7/09   RLE, Tx Arixtra  . Dysrhythmia    v fib and a fib has icd  . Hemorrhoids    hx of  . Hyperlipidemia   . Hypertension   . 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 Obesity   . Personal history of colon cancer, stage III 1/09   Dr. Carlean Purl, Benay Spice, D. Newman  . Sigmoid colon ulcer 5 yrs ago   Sigmoid; Nydia Bouton, MD (colon cancer)  . Sleep apnea    uses cpap setting of 11.2  . Umbilical hernia     Past Surgical History:  Procedure Laterality Date  . CARDIAC DEFIBRILLATOR PLACEMENT     2004  . CHOLECYSTECTOMY    . COLECTOMY  1/09   Sigmoid; Nydia Bouton, MD (  colon cancer)  . COLONOSCOPY    . COLONOSCOPY N/A 02/25/2013   Procedure: COLONOSCOPY;  Surgeon: Gatha Mayer, MD;  [facility];  Service: Endoscopy;  Laterality: N/A;  . HERNIA REPAIR  yrs ago   umb hernia  . ICD GENERATOR CHANGEOUT N/A 04/12/2017   Procedure: [place];  Surgeon: Evans Lance, MD;  Location: [place] [facility];  Service: Cardiovascular;  Laterality: N/A;  . LAPAROSCOPIC GASTRIC SLEEVE RESECTION     Dr. Lucia Gaskins 04-30-17  . LAPAROSCOPIC GASTRIC SLEEVE RESECTION N/A 04/30/2017   Procedure: LAPAROSCOPIC GASTRIC SLEEVE RESECTION WITH UPPER ENDO;  Surgeon: Alphonsa Overall, MD;  [facility];  Service: General;  Laterality: N/A;    Family History  Problem Relation Age of Onset  . Diabetes Father   . Cancer Father        myelofibrosis  . Colon cancer Other   . Diabetes Maternal Grandmother   . Cancer Maternal  Aunt        colon  . Hypertension Neg Hx   . Coronary artery disease Neg Hx   . Prostate cancer Neg Hx   . Esophageal cancer Neg Hx   . Rectal cancer Neg Hx   . Stomach cancer Neg Hx     Social History   Socioeconomic History  . Marital status: Married    Spouse name: Not on file  . Number of children: 2  . Years of education: Not on file  . Highest education level: Not on file  Occupational History  . Occupation: disabled    Comment: Disabled  Social Needs  . Financial resource strain: Not on file  . Food insecurity    Worry: Not on file    Inability: Not on file  . Transportation needs    Medical: Not on file    Non-medical: Not on file  Tobacco Use  . Smoking status: Never Smoker  . Smokeless tobacco: Never Used  Substance and Sexual Activity  . Alcohol use: No    Alcohol/week: 0.0 standard drinks  . Drug use: No  . Sexual activity: Yes  Lifestyle  . Physical activity    Days per week: Not on file    Minutes per session: Not on file  . Stress: Not on file  Relationships  . Social Herbalist on phone: Not on file    Gets together: Not on file    Attends religious service: Not on file    Active member of club or organization: Not on file    Attends meetings of clubs or organizations: Not on file    Relationship status: Not on file  . Intimate partner violence    Fear of current or ex partner: Not on file    Emotionally abused: Not on file    Physically abused: Not on file    Forced sexual activity: Not on file  Other Topics Concern  . Not on file  Social History Narrative   2 step children from wife's 2nd marriage.      Has living will   Wife is health care POA---alternate would be sister, Vickie Epley   Would accept resuscitation attempts   Not sure about tube feedings   Review of Systems  Trying to stay safe with [place] through for restaurants Mask if he goes to supermarket No abnormal bruising or bleeding    Objective:    Physical Exam  Constitutional: He appears well-developed. No distress.  Neck: No thyromegaly present.  Cardiovascular: Normal rate, regular rhythm and normal heart sounds. Exam reveals no gallop.  No murmur heard. Respiratory: Effort normal and breath sounds normal. No respiratory distress. He has no wheezes. He has no rales.  Lymphadenopathy:    He has no cervical adenopathy.  Skin:  No foot lesions  Psychiatric: He has a normal mood and affect. His behavior is normal.           Assessment & Plan:

## 2018-09-12 NOTE — Assessment & Plan Note (Signed)
Doing better on low dose glipizide Will check A1c No sig extremity pain

## 2018-09-13 LAB — CBC
HCT: 49.6 % (ref 39.0–52.0)
Hemoglobin: 16.8 g/dL (ref 13.0–17.0)
MCHC: 33.7 g/dL (ref 30.0–36.0)
MCV: 93.4 fl (ref 78.0–100.0)
Platelets: 138 10*3/uL — ABNORMAL LOW (ref 150.0–400.0)
RBC: 5.32 Mil/uL (ref 4.22–5.81)
RDW: 14 % (ref 11.5–15.5)
WBC: 8.7 10*3/uL (ref 4.0–10.5)

## 2018-09-13 LAB — HEMOGLOBIN A1C: Hgb A1c MFr Bld: 7.1 % — ABNORMAL HIGH (ref 4.6–6.5)

## 2018-09-13 LAB — COMPREHENSIVE METABOLIC PANEL
ALT: 21 U/L (ref 0–53)
AST: 15 U/L (ref 0–37)
Albumin: 4.2 g/dL (ref 3.5–5.2)
Alkaline Phosphatase: 68 U/L (ref 39–117)
BUN: 28 mg/dL — ABNORMAL HIGH (ref 6–23)
CO2: 28 mEq/L (ref 19–32)
Calcium: 9.6 mg/dL (ref 8.4–10.5)
Chloride: 102 mEq/L (ref 96–112)
Creatinine, Ser: 1.78 mg/dL — ABNORMAL HIGH (ref 0.40–1.50)
GFR: 39.41 mL/min — ABNORMAL LOW (ref >60.00)
Glucose, Bld: 117 mg/dL — ABNORMAL HIGH (ref 70–99)
Potassium: 4.4 mEq/L (ref 3.5–5.1)
Sodium: 141 mEq/L (ref 135–145)
Total Bilirubin: 0.7 mg/dL (ref 0.2–1.2)
Total Protein: 7.1 g/dL (ref 6.0–8.3)

## 2018-09-13 LAB — LIPID PANEL
Cholesterol: 123 mg/dL (ref 0–200)
HDL: 29.6 mg/dL — ABNORMAL LOW (ref >39.00)
LDL Cholesterol: 55 mg/dL (ref 0–99)
NonHDL: 93.81
Total CHOL/HDL Ratio: 4
Triglycerides: 196 mg/dL — ABNORMAL HIGH (ref 0.0–149.0)
VLDL: 39.2 mg/dL (ref 0.0–40.0)

## 2018-09-14 NOTE — Assessment & Plan Note (Signed)
Mild but no abnormal bleeding

## 2018-10-09 DIAGNOSIS — G4733 Obstructive sleep apnea (adult) (pediatric): Secondary | ICD-10-CM | POA: Diagnosis not present

## 2018-10-14 ENCOUNTER — Ambulatory Visit (INDEPENDENT_AMBULATORY_CARE_PROVIDER_SITE_OTHER): Payer: Medicare HMO | Admitting: *Deleted

## 2018-10-14 DIAGNOSIS — I5032 Chronic diastolic (congestive) heart failure: Secondary | ICD-10-CM | POA: Diagnosis not present

## 2018-10-14 DIAGNOSIS — I472 Ventricular tachycardia, unspecified: Secondary | ICD-10-CM

## 2018-10-14 LAB — CUP PACEART REMOTE DEVICE CHECK
Battery Remaining Longevity: 124 mo
Battery Voltage: 3.02 V
Brady Statistic RV Percent Paced: 10.47 %
Date Time Interrogation Session: 20200928041606
HighPow Impedance: 55 Ohm
HighPow Impedance: 73 Ohm
Implantable Lead Implant Date: 20050113
Implantable Lead Location: 753860
Implantable Lead Model: 6949
Implantable Pulse Generator Implant Date: 20190328
Lead Channel Impedance Value: 475 Ohm
Lead Channel Impedance Value: 589 Ohm
Lead Channel Pacing Threshold Amplitude: 0.875 V
Lead Channel Pacing Threshold Pulse Width: 0.4 ms
Lead Channel Sensing Intrinsic Amplitude: 11.125 mV
Lead Channel Sensing Intrinsic Amplitude: 11.125 mV
Lead Channel Setting Pacing Amplitude: 2.5 V
Lead Channel Setting Pacing Pulse Width: 0.4 ms
Lead Channel Setting Sensing Sensitivity: 0.3 mV

## 2018-10-23 NOTE — Progress Notes (Signed)
Remote ICD transmission.   

## 2018-11-21 ENCOUNTER — Encounter (HOSPITAL_COMMUNITY): Payer: Self-pay

## 2019-01-03 ENCOUNTER — Ambulatory Visit: Payer: Medicare HMO | Attending: Internal Medicine

## 2019-01-03 DIAGNOSIS — Z20828 Contact with and (suspected) exposure to other viral communicable diseases: Secondary | ICD-10-CM | POA: Diagnosis not present

## 2019-01-03 DIAGNOSIS — Z20822 Contact with and (suspected) exposure to covid-19: Secondary | ICD-10-CM

## 2019-01-04 LAB — NOVEL CORONAVIRUS, NAA: SARS-CoV-2, NAA: NOT DETECTED

## 2019-01-13 ENCOUNTER — Ambulatory Visit (INDEPENDENT_AMBULATORY_CARE_PROVIDER_SITE_OTHER): Payer: Medicare HMO | Admitting: *Deleted

## 2019-01-13 DIAGNOSIS — Z95810 Presence of automatic (implantable) cardiac defibrillator: Secondary | ICD-10-CM | POA: Diagnosis not present

## 2019-01-13 LAB — CUP PACEART REMOTE DEVICE CHECK
Battery Remaining Longevity: 122 mo
Battery Voltage: 3.01 V
Brady Statistic RV Percent Paced: 14.29 %
Date Time Interrogation Session: 20201228031606
HighPow Impedance: 54 Ohm
HighPow Impedance: 67 Ohm
Implantable Lead Implant Date: 20050113
Implantable Lead Location: 753860
Implantable Lead Model: 6949
Implantable Pulse Generator Implant Date: 20190328
Lead Channel Impedance Value: 513 Ohm
Lead Channel Impedance Value: 608 Ohm
Lead Channel Pacing Threshold Amplitude: 1 V
Lead Channel Pacing Threshold Pulse Width: 0.4 ms
Lead Channel Sensing Intrinsic Amplitude: 11 mV
Lead Channel Sensing Intrinsic Amplitude: 11 mV
Lead Channel Setting Pacing Amplitude: 2.5 V
Lead Channel Setting Pacing Pulse Width: 0.4 ms
Lead Channel Setting Sensing Sensitivity: 0.3 mV

## 2019-01-14 NOTE — Progress Notes (Signed)
ICD remote 

## 2019-01-16 DIAGNOSIS — G4733 Obstructive sleep apnea (adult) (pediatric): Secondary | ICD-10-CM | POA: Diagnosis not present

## 2019-01-23 DIAGNOSIS — K432 Incisional hernia without obstruction or gangrene: Secondary | ICD-10-CM | POA: Diagnosis not present

## 2019-01-23 DIAGNOSIS — Z903 Acquired absence of stomach [part of]: Secondary | ICD-10-CM | POA: Diagnosis not present

## 2019-01-23 DIAGNOSIS — E1169 Type 2 diabetes mellitus with other specified complication: Secondary | ICD-10-CM | POA: Diagnosis not present

## 2019-01-23 DIAGNOSIS — G4733 Obstructive sleep apnea (adult) (pediatric): Secondary | ICD-10-CM | POA: Diagnosis not present

## 2019-01-23 DIAGNOSIS — Z7901 Long term (current) use of anticoagulants: Secondary | ICD-10-CM | POA: Diagnosis not present

## 2019-01-23 DIAGNOSIS — E669 Obesity, unspecified: Secondary | ICD-10-CM | POA: Diagnosis not present

## 2019-02-21 ENCOUNTER — Other Ambulatory Visit: Payer: Self-pay | Admitting: Internal Medicine

## 2019-03-24 ENCOUNTER — Other Ambulatory Visit: Payer: Self-pay

## 2019-03-24 ENCOUNTER — Ambulatory Visit (INDEPENDENT_AMBULATORY_CARE_PROVIDER_SITE_OTHER): Payer: Medicare HMO | Admitting: Internal Medicine

## 2019-03-24 ENCOUNTER — Encounter: Payer: Self-pay | Admitting: Internal Medicine

## 2019-03-24 VITALS — BP 132/84 | HR 68 | Temp 98.6°F | Ht 73.0 in | Wt 375.0 lb

## 2019-03-24 DIAGNOSIS — I5032 Chronic diastolic (congestive) heart failure: Secondary | ICD-10-CM

## 2019-03-24 DIAGNOSIS — I472 Ventricular tachycardia, unspecified: Secondary | ICD-10-CM

## 2019-03-24 DIAGNOSIS — I480 Paroxysmal atrial fibrillation: Secondary | ICD-10-CM | POA: Diagnosis not present

## 2019-03-24 DIAGNOSIS — Z125 Encounter for screening for malignant neoplasm of prostate: Secondary | ICD-10-CM

## 2019-03-24 DIAGNOSIS — N1832 Chronic kidney disease, stage 3b: Secondary | ICD-10-CM

## 2019-03-24 DIAGNOSIS — Z0000 Encounter for general adult medical examination without abnormal findings: Secondary | ICD-10-CM | POA: Diagnosis not present

## 2019-03-24 DIAGNOSIS — Z7189 Other specified counseling: Secondary | ICD-10-CM

## 2019-03-24 DIAGNOSIS — D696 Thrombocytopenia, unspecified: Secondary | ICD-10-CM | POA: Diagnosis not present

## 2019-03-24 DIAGNOSIS — E1140 Type 2 diabetes mellitus with diabetic neuropathy, unspecified: Secondary | ICD-10-CM

## 2019-03-24 LAB — HM DIABETES FOOT EXAM

## 2019-03-24 NOTE — Assessment & Plan Note (Signed)
Last GFR 39 Is on ACEI Will recheck

## 2019-03-24 NOTE — Assessment & Plan Note (Signed)
Continues to work on healthy eating and discussed increased resistance training, etc

## 2019-03-24 NOTE — Assessment & Plan Note (Signed)
No recurrence on amiodarone

## 2019-03-24 NOTE — Progress Notes (Signed)
Subjective:    Patient ID: Craig Lindsey, male    DOB: 13-Mar-1960, 59 y.o.   MRN: [id number]  HPI Here for Medicare wellness visit and follow up of chronic health conditions This visit occurred during the SARS-CoV-2 public health emergency.  Safety protocols were in place, including screening questions prior to the visit, additional usage of staff PPE, and extensive cleaning of exam room while observing appropriate contact time as indicated for disinfecting solutions.   Reviewed form and advanced directives Reviewed other doctors No alcohol or tobacco Exercises a little-walks some (discussed) Vision is fine Hearing is good No falls No depression or anhedonia Independent with instrumental ADLs Mild memory issues---will misplace things, etc  Checking sugars daily 158 yesterday ---this is about average No hypoglycemic reactions Ongoing foot numbness---but not really painful Going for eye exam next week  Has pacer/defibrillator No recent atrial fibrillation No V tach either Continues on the amiodarone No no chest pain or palpitaitons No dizziness or syncope Mild left foot swelling--wears support hose  Known mild low platelets No abnormal bruising or bleeding  Sleeps well with CPAP Uses it every night  Known GFR ~39 Is on ACEI  Current Outpatient Medications on File Prior to Visit  Medication Sig Dispense Refill  . ACCU-CHEK FASTCLIX LANCETS MISC Use to check blood sugar three times a day.  Dx: E11.40 306 each 3  . ACCU-CHEK SMARTVIEW test strip USE AS INSTRUCTED TO TEST BLOOD SUGAR THREE TIMES DAILY 300 each 12  . amiodarone (PACERONE) 200 MG tablet TAKE 1 TABLET (200 MG TOTAL) BY MOUTH DAILY. 90 tablet 3  . atorvastatin (LIPITOR) 10 MG tablet TAKE 1 TABLET (10 MG TOTAL) BY MOUTH DAILY AT 6 PM. 90 tablet 3  . Blood Pressure Monitor DEVI     . calcium carbonate (TUMS - DOSED IN MG ELEMENTAL CALCIUM) 500 MG chewable tablet Chew 1 tablet by mouth 2 (two) times daily.      Marland Kitchen DILT-XR 240 MG 24 hr capsule TAKE 1 CAPSULE (240 MG TOTAL) BY MOUTH EVERY MORNING. 90 capsule 3  . furosemide (LASIX) 40 MG tablet Take 1 tablet (40 mg total) by mouth daily. 135 tablet 3  . glipiZIDE (GLUCOTROL XL) 2.5 MG 24 hr tablet Take 1 tablet (2.5 mg total) by mouth daily with breakfast. 90 tablet 3  . lisinopril (ZESTRIL) 40 MG tablet TAKE 1 TABLET (40 MG TOTAL) BY MOUTH DAILY. 90 tablet 3  . metoprolol succinate (TOPROL-XL) 100 MG 24 hr tablet TAKE 2 TABLETS (200 MG TOTAL) BY MOUTH DAILY. 180 tablet 3  . Multiple Vitamins-Minerals (BARIATRIC MULTIVITAMINS/IRON PO) Take by mouth.    Alveda Reasons 20 MG TABS tablet TAKE 1 TABLET EVERY DAY WITH SUPPER 90 tablet 3   No current facility-administered medications on file prior to visit.    No Known Allergies  Past Medical History:  Diagnosis Date  . Arrhythmia 12/04   1. Ventricular tachycardia; Dr. [name]  2. Atrial fibrillation  . Arthritis    knees  . Chronic kidney disease    Complex L renal cyst, no tx for   . Diabetes mellitus    Type II  . DVT (deep venous thrombosis) ([place]) 7/09   RLE, Tx Arixtra  . Dysrhythmia    v fib and a fib has icd  . Hemorrhoids    hx of  . Hyperlipidemia   . Hypertension   . 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 Obesity   . Personal history  of colon cancer, stage III 1/09   Dr. Carlean Purl, Benay Spice, D. Newman  . Sigmoid colon ulcer 5 yrs ago   Sigmoid; Nydia Bouton, MD (colon cancer)  . Sleep apnea    uses cpap setting of 11.2  . Umbilical hernia     Past Surgical History:  Procedure Laterality Date  . CARDIAC DEFIBRILLATOR PLACEMENT     2004  . CHOLECYSTECTOMY    . COLECTOMY  1/09   Sigmoid; Nydia Bouton, MD (colon cancer)  . COLONOSCOPY    . COLONOSCOPY N/A 02/25/2013   Procedure: COLONOSCOPY;  Surgeon: Gatha Mayer, MD;  [facility];  Service: Endoscopy;  Laterality: N/A;  . HERNIA REPAIR  yrs ago   umb hernia  . ICD GENERATOR CHANGEOUT N/A 04/12/2017    Procedure: [place];  Surgeon: Evans Lance, MD;  Location: [place] [facility];  Service: Cardiovascular;  Laterality: N/A;  . LAPAROSCOPIC GASTRIC SLEEVE RESECTION     Dr. Lucia Gaskins 04-30-17  . LAPAROSCOPIC GASTRIC SLEEVE RESECTION N/A 04/30/2017   Procedure: LAPAROSCOPIC GASTRIC SLEEVE RESECTION WITH UPPER ENDO;  Surgeon: Alphonsa Overall, MD;  [facility];  Service: General;  Laterality: N/A;    Family History  Problem Relation Age of Onset  . Diabetes Father   . Cancer Father        myelofibrosis  . Colon cancer Other   . Diabetes Maternal Grandmother   . Cancer Maternal Aunt        colon  . Hypertension Neg Hx   . Coronary artery disease Neg Hx   . Prostate cancer Neg Hx   . Esophageal cancer Neg Hx   . Rectal cancer Neg Hx   . Stomach cancer Neg Hx     Social History   Socioeconomic History  . Marital status: Married    Spouse name: Not on file  . Number of children: 2  . Years of education: Not on file  . Highest education level: Not on file  Occupational History  . Occupation: Chartered certified accountant    Comment: Disabled  Tobacco Use  . Smoking status: Never Smoker  . Smokeless tobacco: Never Used  Substance and Sexual Activity  . Alcohol use: No    Alcohol/week: 0.0 standard drinks  . Drug use: No  . Sexual activity: Yes  Other Topics Concern  . Not on file  Social History Narrative   2 step children from wife's 2nd marriage.      Has living will   Wife is health care POA---alternate would be sister, Vickie Epley   Would accept resuscitation attempts   Not sure about tube feedings   Social Determinants of Health   Financial Resource Strain:   . Difficulty of Paying Living Expenses: Not on file  Food Insecurity:   . Worried About Charity fundraiser in the Last Year: Not on file  . Ran Out of Food in the Last Year: Not on file  Transportation Needs:   . Lack of Transportation (Medical): Not on file  . Lack of Transportation  (Non-Medical): Not on file  Physical Activity:   . Days of Exercise per Week: Not on file  . Minutes of Exercise per Session: Not on file  Stress:   . Feeling of Stress : Not on file  Social Connections:   . Frequency of Communication with Friends and Family: Not on file  . Frequency of Social Gatherings with Friends and Family: Not on file  . Attends Religious  Services: Not on file  . Active Member of Clubs or Organizations: Not on file  . Attends Archivist Meetings: Not on file  . Marital Status: Not on file  Intimate Partner Violence:   . Fear of Current or Ex-Partner: Not on file  . Emotionally Abused: Not on file  . Physically Abused: Not on file  . Sexually Abused: Not on file   Review of Systems Has been trying to eat right--but can't lose weight Having bad right knee pain if he walks--discussed resistance bands Weight stable Sleeps okay Wears seat belt No heartburn or dysphagia Bowels are fine--colon last year was benign Voids fine--stream is okay. No nocturia No other sig joint pains No rash or skin problems Teeth are fine---keeps up with dentist    Objective:   Physical Exam  Constitutional: He is oriented to person, place, and time. He appears well-developed. No distress.  HENT:  No oral lesions  Neck: No thyromegaly present.  Cardiovascular: Normal rate, regular rhythm, normal heart sounds and intact distal pulses. Exam reveals no gallop.  No murmur heard. Respiratory: Effort normal and breath sounds normal. No respiratory distress. He has no wheezes. He has no rales.  GI: Soft. There is no abdominal tenderness.  Musculoskeletal:        General: No tenderness or edema.  Lymphadenopathy:    He has no cervical adenopathy.  Neurological: He is alert and oriented to person, place, and time.  President--- "Zoila Shutter, Obama" [phone] [name] Recall 3/3  Decreased sensation in feet  Skin: No rash noted.  No foot ulcers Stasis  changes on both ankles  Psychiatric: He has a normal mood and affect. His behavior is normal.           Assessment & Plan:

## 2019-03-24 NOTE — Assessment & Plan Note (Signed)
Hopefully still acceptable control Numbness only--no sig foot pain

## 2019-03-24 NOTE — Assessment & Plan Note (Signed)
Apparently stays in sinus Is on the xarelto

## 2019-03-24 NOTE — Assessment & Plan Note (Signed)
Mild without bleeding or bruising Will recheck

## 2019-03-24 NOTE — Assessment & Plan Note (Signed)
I have personally reviewed the Medicare Annual Wellness questionnaire and have noted 1. The patient's medical and social history 2. Their use of alcohol, tobacco or illicit drugs 3. Their current medications and supplements 4. The patient's functional ability including ADL's, fall risks, home safety risks and hearing or visual             impairment. 5. Diet and physical activities 6. Evidence for depression or mood disorders  The patients weight, height, BMI and visual acuity have been recorded in the chart I have made referrals, counseling and provided education to the patient based review of the above and I have provided the pt with a written personalized care plan for preventive services.  I have provided you with a copy of your personalized plan for preventive services. Please take the time to review along with your updated medication list.  Will get COVID vaccine as soon as he is able Flu vaccine in the fall Discussed exercise Discussed PSA---will check Recent colon ---freq due to past cancer

## 2019-03-24 NOTE — Progress Notes (Signed)
Hearing Screening   Method: Audiometry   125Hz  250Hz  500Hz  1000Hz  2000Hz  3000Hz  4000Hz  6000Hz  8000Hz   Right ear:   20 20 20  20     Left ear:   20 20 20  20     Vision Screening Comments: February 202. Appt 03/27/19

## 2019-03-24 NOTE — Assessment & Plan Note (Signed)
See social history 

## 2019-03-24 NOTE — Assessment & Plan Note (Signed)
Compensated on current regimen No changes in meds needed

## 2019-03-25 LAB — HEPATIC FUNCTION PANEL
ALT: 27 U/L (ref 0–53)
AST: 21 U/L (ref 0–37)
Albumin: 4.2 g/dL (ref 3.5–5.2)
Alkaline Phosphatase: 70 U/L (ref 39–117)
Bilirubin, Direct: 0.1 mg/dL (ref 0.0–0.3)
Total Bilirubin: 0.6 mg/dL (ref 0.2–1.2)
Total Protein: 7.3 g/dL (ref 6.0–8.3)

## 2019-03-25 LAB — RENAL FUNCTION PANEL
Albumin: 4.2 g/dL (ref 3.5–5.2)
BUN: 25 mg/dL — ABNORMAL HIGH (ref 6–23)
CO2: 28 mEq/L (ref 19–32)
Calcium: 9.2 mg/dL (ref 8.4–10.5)
Chloride: 103 mEq/L (ref 96–112)
Creatinine, Ser: 1.52 mg/dL — ABNORMAL HIGH (ref 0.40–1.50)
GFR: 47.2 mL/min — ABNORMAL LOW (ref >60.00)
Glucose, Bld: 142 mg/dL — ABNORMAL HIGH (ref 70–99)
Phosphorus: 3.1 mg/dL (ref 2.3–4.6)
Potassium: 4.5 mEq/L (ref 3.5–5.1)
Sodium: 138 mEq/L (ref 135–145)

## 2019-03-25 LAB — CBC
HCT: 50.9 % (ref 39.0–52.0)
Hemoglobin: 16.9 g/dL (ref 13.0–17.0)
MCHC: 33.2 g/dL (ref 30.0–36.0)
MCV: 93.6 fl (ref 78.0–100.0)
Platelets: 151 10*3/uL (ref 150.0–400.0)
RBC: 5.44 Mil/uL (ref 4.22–5.81)
RDW: 13.6 % (ref 11.5–15.5)
WBC: 8.3 10*3/uL (ref 4.0–10.5)

## 2019-03-25 LAB — T4, FREE: Free T4: 1.27 ng/dL (ref 0.60–1.60)

## 2019-03-25 LAB — PSA, MEDICARE: PSA: 0.59 ng/ml (ref 0.10–4.00)

## 2019-03-25 LAB — HEMOGLOBIN A1C: Hgb A1c MFr Bld: 6.9 % — ABNORMAL HIGH (ref 4.6–6.5)

## 2019-03-28 ENCOUNTER — Other Ambulatory Visit: Payer: Self-pay

## 2019-03-28 ENCOUNTER — Ambulatory Visit: Payer: Medicare HMO | Attending: Internal Medicine

## 2019-03-28 ENCOUNTER — Telehealth: Payer: Self-pay | Admitting: Internal Medicine

## 2019-03-28 DIAGNOSIS — Z23 Encounter for immunization: Secondary | ICD-10-CM

## 2019-03-28 NOTE — Chronic Care Management (AMB) (Signed)
  Chronic Care Management   Note  03/28/2019 Name: JAMON ENGEMAN MRN: [id number] DOB: 04-01-1960  RUSHTON DIVAN is a 59 y.o. year old male who is a primary care patient of Venia Carbon, MD. I reached out to [name] by phone today in response to a referral sent by Mr. Colt Zysk Banker's PCP, Venia Carbon, MD.   Mr. Hansbury was given information about Chronic Care Management services today including:  1. CCM service includes personalized support from designated clinical staff supervised by his physician, including individualized plan of care and coordination with other care providers 2. 24/7 contact phone numbers for assistance for urgent and routine care needs. 3. Service will only be billed when office clinical staff spend 20 minutes or more in a month to coordinate care. 4. Only one practitioner may furnish and bill the service in a calendar month. 5. The patient may stop CCM services at any time (effective at the end of the month) by phone call to the office staff.   Patient agreed to services and verbal consent obtained.   Follow up plan:   Raynicia Dukes [facility] Scheduler

## 2019-03-28 NOTE — Progress Notes (Signed)
   Covid-19 Vaccination Clinic  Name:  Craig Lindsey    MRN: [id number] DOB: [birth year]  03/28/2019  [name] was observed post Covid-19 immunization for 15 minutes without incident. He was provided with Vaccine Information Sheet and instruction to access the V-Safe system.   [name] was instructed to call 911 with any severe reactions post vaccine: Marland Kitchen Difficulty breathing  . Swelling of face and throat  . A fast heartbeat  . A bad rash all over body  . Dizziness and weakness   Immunizations Administered    Name Date Dose VIS Date Route   Pfizer COVID-19 Vaccine 03/28/2019 11:33 AM 0.3 mL 12/27/2018 Intramuscular   Manufacturer: [place]   Lot: [id number]   [place]: [id number]

## 2019-04-07 ENCOUNTER — Telehealth: Payer: Self-pay | Admitting: Internal Medicine

## 2019-04-07 NOTE — Progress Notes (Signed)
Trying to contact patient to reschedule appointment on 04/16/2019 @ 11am.    Raynicia Dukes [facility] Scheduler

## 2019-04-08 DIAGNOSIS — G4733 Obstructive sleep apnea (adult) (pediatric): Secondary | ICD-10-CM | POA: Diagnosis not present

## 2019-04-14 ENCOUNTER — Ambulatory Visit (INDEPENDENT_AMBULATORY_CARE_PROVIDER_SITE_OTHER): Payer: Medicare HMO | Admitting: *Deleted

## 2019-04-14 DIAGNOSIS — Z95810 Presence of automatic (implantable) cardiac defibrillator: Secondary | ICD-10-CM | POA: Diagnosis not present

## 2019-04-14 LAB — CUP PACEART REMOTE DEVICE CHECK
Battery Remaining Longevity: 120 mo
Battery Voltage: 3.02 V
Brady Statistic RV Percent Paced: 13.3 %
Date Time Interrogation Session: 20210329043624
HighPow Impedance: 51 Ohm
HighPow Impedance: 64 Ohm
Implantable Lead Implant Date: 20050113
Implantable Lead Location: 753860
Implantable Lead Model: 6949
Implantable Pulse Generator Implant Date: 20190328
Lead Channel Impedance Value: 475 Ohm
Lead Channel Impedance Value: 646 Ohm
Lead Channel Pacing Threshold Amplitude: 1 V
Lead Channel Pacing Threshold Pulse Width: 0.4 ms
Lead Channel Sensing Intrinsic Amplitude: 9.875 mV
Lead Channel Sensing Intrinsic Amplitude: 9.875 mV
Lead Channel Setting Pacing Amplitude: 2.5 V
Lead Channel Setting Pacing Pulse Width: 0.4 ms
Lead Channel Setting Sensing Sensitivity: 0.3 mV

## 2019-04-14 NOTE — Progress Notes (Signed)
ICD Remote  

## 2019-04-16 ENCOUNTER — Telehealth: Payer: Medicare HMO

## 2019-04-21 DIAGNOSIS — H5213 Myopia, bilateral: Secondary | ICD-10-CM | POA: Diagnosis not present

## 2019-04-22 ENCOUNTER — Ambulatory Visit: Payer: Medicare HMO | Attending: Internal Medicine

## 2019-04-22 DIAGNOSIS — Z23 Encounter for immunization: Secondary | ICD-10-CM

## 2019-04-22 NOTE — Progress Notes (Signed)
   Covid-19 Vaccination Clinic  Name:  Craig Lindsey    MRN: [id number] DOB: 11-Jan-1961  04/22/2019  [name] was observed post Covid-19 immunization for 15 minutes without incident. He was provided with Vaccine Information Sheet and instruction to access the V-Safe system.   [name] was instructed to call 911 with any severe reactions post vaccine: Marland Kitchen Difficulty breathing  . Swelling of face and throat  . A fast heartbeat  . A bad rash all over body  . Dizziness and weakness   Immunizations Administered    Name Date Dose VIS Date Route   Pfizer COVID-19 Vaccine 04/22/2019 11:36 AM 0.3 mL 12/27/2018 Intramuscular   Manufacturer: [place]   Lot: [id number]   [place]: [id number]

## 2019-04-28 ENCOUNTER — Telehealth: Payer: Self-pay

## 2019-04-28 DIAGNOSIS — I10 Essential (primary) hypertension: Secondary | ICD-10-CM

## 2019-04-28 DIAGNOSIS — I4891 Unspecified atrial fibrillation: Secondary | ICD-10-CM

## 2019-04-28 NOTE — Telephone Encounter (Signed)
Referral created.

## 2019-04-28 NOTE — Telephone Encounter (Signed)
Per written referral from PCP, requesting referral in Epic for [name] to chronic care management pharmacy services for the following conditions:   Essential hypertension, benign  [I10  Atrial fibrillation ([place]) [I48.91]  Debbora Dus, PharmD Clinical Pharmacist [facility] [phone]

## 2019-04-29 ENCOUNTER — Other Ambulatory Visit: Payer: Self-pay

## 2019-04-29 ENCOUNTER — Ambulatory Visit: Payer: Medicare HMO

## 2019-04-29 ENCOUNTER — Telehealth: Payer: Self-pay

## 2019-04-29 DIAGNOSIS — E1140 Type 2 diabetes mellitus with diabetic neuropathy, unspecified: Secondary | ICD-10-CM

## 2019-04-29 DIAGNOSIS — I10 Essential (primary) hypertension: Secondary | ICD-10-CM

## 2019-04-29 DIAGNOSIS — I5032 Chronic diastolic (congestive) heart failure: Secondary | ICD-10-CM

## 2019-04-29 DIAGNOSIS — E785 Hyperlipidemia, unspecified: Secondary | ICD-10-CM

## 2019-04-29 DIAGNOSIS — N1832 Chronic kidney disease, stage 3b: Secondary | ICD-10-CM

## 2019-04-29 DIAGNOSIS — I480 Paroxysmal atrial fibrillation: Secondary | ICD-10-CM

## 2019-04-29 DIAGNOSIS — G4730 Sleep apnea, unspecified: Secondary | ICD-10-CM

## 2019-04-29 DIAGNOSIS — E1121 Type 2 diabetes mellitus with diabetic nephropathy: Secondary | ICD-10-CM

## 2019-04-29 MED ORDER — RIVAROXABAN 20 MG PO TABS: ORAL_TABLET | ORAL | 11 refills | Status: DC

## 2019-04-29 NOTE — Chronic Care Management (AMB) (Signed)
Chronic Care Management Pharmacy  Name: Craig Lindsey  MRN: [id number] DOB: 1960-06-22  Chief Complaint/ HPI  Craig Lindsey,  59 y.o., male presents for their Initial CCM visit with the clinical pharmacist via telephone.  PCP : Craig Carbon, MD  Their chronic conditions include: hypertension, hyperlipidemia, diabetes, neuropathy, atrial fibrillation, heart failure, sleep apnea, osteoarthritis, chronic renal disease  Patient concerns: denies medication concerns or changes in past 6 months  Office Visits:  03/24/19: Craig Lindsey - exercise, PSA, cont current meds  09/12/19: Craig Lindsey - DM improving on low dose glipizide, check A1c  Consult Visit: no notes in last 6 months  No Known Allergies  Medications: Outpatient Encounter Medications as of 04/29/2019  Medication Sig  . ACCU-CHEK FASTCLIX LANCETS MISC Use to check blood sugar three times a day.  Dx: E11.40  . ACCU-CHEK SMARTVIEW test strip USE AS INSTRUCTED TO TEST BLOOD SUGAR THREE TIMES DAILY  . amiodarone (PACERONE) 200 MG tablet TAKE 1 TABLET (200 MG TOTAL) BY MOUTH DAILY.  Marland Kitchen atorvastatin (LIPITOR) 10 MG tablet TAKE 1 TABLET (10 MG TOTAL) BY MOUTH DAILY AT 6 PM.  . Blood Pressure Monitor DEVI   . calcium carbonate (TUMS - DOSED IN MG ELEMENTAL CALCIUM) 500 MG chewable tablet Chew 1 tablet by mouth 2 (two) times daily.   Marland Kitchen DILT-XR 240 MG 24 hr capsule TAKE 1 CAPSULE (240 MG TOTAL) BY MOUTH EVERY MORNING.  . furosemide (LASIX) 40 MG tablet Take 1 tablet (40 mg total) by mouth daily.  Marland Kitchen glipiZIDE (GLUCOTROL XL) 2.5 MG 24 hr tablet Take 1 tablet (2.5 mg total) by mouth daily with breakfast.  . lisinopril (ZESTRIL) 40 MG tablet TAKE 1 TABLET (40 MG TOTAL) BY MOUTH DAILY.  . metoprolol succinate (TOPROL-XL) 100 MG 24 hr tablet TAKE 2 TABLETS (200 MG TOTAL) BY MOUTH DAILY.  . Multiple Vitamins-Minerals (BARIATRIC MULTIVITAMINS/IRON PO) Take by mouth.  Alveda Reasons 20 MG TABS tablet TAKE 1 TABLET EVERY DAY WITH SUPPER   No  facility-administered encounter medications on file as of 04/29/2019.   Current Diagnosis/Assessment: Goals    . Pharmacy Care Plan     CARE PLAN ENTRY  Current Barriers:  . Chronic Disease Management support, education, and care coordination needs related to hypertension, heart failure  Pharmacist Clinical Goal(s):  Marland Kitchen Over the next 6 months, patient will work with PharmD and primary care provider to address the following goals:  . Hypertension: Maintain blood pressure within goal of less than 140/90 mmHg. Continue current medications as prescribed. Call if you notice any blood pressure elevations. . Diabetes: Maintain A1c less than 7% and fasting blood glucose goal of 80-130 mg/dL. Continue current medications as prescribed and checking blood glucose once daily.  Marland Kitchen Heart failure: Prevent exacerbation or fluid overload. Check weight daily in the morning; If you gain more than 3 pounds overnight, please call. . Atrial fibrillation: Compare cost of Xarelto for patient with current pharmacy. . Vaccinations: Recommend 2-dose series of Shingrix vaccine.  Interventions: . Comprehensive medication review performed . Discussed recommended vaccinations and goals for blood pressure and diabetes.  Patient Self Care Activities:  . Self administers medications as prescribed . Self-monitors blood pressure and blood glucose  Initial goal documentation      Hypertension   CMP Latest Ref Rng & Units 03/24/2019 09/12/2018 10/30/2017  Glucose 70 - 99 mg/dL 142(H) 117(H) 161(H)  BUN 6 - 23 mg/dL 25(H) 28(H) 27(H)  Creatinine 0.40 - 1.50 mg/dL 1.52(H) 1.78(H) 1.36  Sodium 135 -  145 mEq/L 138 141 139  Potassium 3.5 - 5.1 mEq/L 4.5 4.4 4.3  Chloride 96 - 112 mEq/L 103 102 105  CO2 19 - 32 mEq/L 28 28 30   Calcium 8.4 - 10.5 mg/dL 9.2 9.6 9.4  Total Protein 6.0 - 8.3 g/dL 7.3 7.1 7.2  Total Bilirubin 0.2 - 1.2 mg/dL 0.6 0.7 0.6  Alkaline Phos 39 - 117 U/L 70 68 68  AST 0 - 37 U/L 21 15 16   ALT 0 - 53  U/L 27 21 26    Office blood pressures are: BP Readings from Last 3 Encounters:  03/24/19 132/84  09/12/18 118/76  09/06/18 140/76   BP goal < 140/90 mmHg Patient has failed these meds in the past: none Patient checks BP at home when feeling symptomatic Patient home BP readings are ranging: reports SBP 135 mmHg  Patient is currently controlled on the following medications:   Furosemide 40 mg - 1 tablet daily  Lisinopril 40 mg - 1 tablet daily  Metoprolol succinate 100 mg - 2 tablets daily  We discussed: blood pressure goals  Plan: Continue current medications  Hyperlipidemia   Lipid Panel     Component Value Date/Time   CHOL 123 09/12/2018 1536   TRIG 196.0 (H) 09/12/2018 1536   HDL 29.60 (L) 09/12/2018 1536   CHOLHDL 4 09/12/2018 1536   VLDL 39.2 09/12/2018 1536   LDLCALC 55 09/12/2018 1536   LDLDIRECT 78.3 03/10/2013 1229    LDL goal < 100 (denies CV history) Patient has failed these meds in past: none Patient is currently controlled on the following medications:   Atorvastatin 10 mg - 1 tablet daily (PM)  We discussed: denies concerns  Plan: Continue current medications  AFIB   Followed by cardiology  Symptoms: denies Patient is currently rate/rhythm controlled.  Patient has failed these meds in past: none Patient is currently controlled on the following medications:   Amiodarone 200 mg - 1 tablet daily  Diltiazem 240 mg XR - 1 capsule daily   Xarelto 20 mg - 1 tablet with supper  We discussed: confirmed adherence  Plan: Continue current medications  Diabetes   Recent Relevant Labs: Lab Results  Component Value Date/Time   HGBA1C 6.9 (H) 03/24/2019 04:29 PM   HGBA1C 7.1 (H) 09/12/2018 03:36 PM   MICROALBUR 1.3 02/19/2014 02:59 PM   MICROALBUR 25.1 (H) 09/13/2010 12:46 PM    Checking BG: Weekly Recent FBG Readings: 130s  Patient has failed these meds in past: insulin - prior to bariatric surgery; metformin (d/c due to renal impariment),  Januvia (unknown) Patient is currently controlled on the following medications:   Glipizide XL 2.5 mg - 1 tablet daily with breakfast  Last diabetic eye exam:  Lab Results  Component Value Date/Time   HMDIABEYEEXA No Retinopathy 02/27/2018 12:00 AM    Last diabetic foot exam:  Lab Results  Component Value Date/Time   HMDIABFOOTEX done 03/24/2019 12:00 AM    We discussed: fasting blood glucose goals/diet and exercise  Plan: Continue current medications   Heart Failure   Type: Diastolic Last ejection fraction: 2019 55-60% NYHA Class: I (no actitivty limitation) AHA HF Stage: B (Heart disease present - no symptoms present)  Patient has failed these meds in past:  Patient is currently controlled on the following medications:   Furosemide 40 mg - 1 tablet daily  Lisinopril 40 mg - 1 tablet daily  Metoprolol succinate 100 mg - 2 tablets daily  We discussed weighing daily; if you gain more  than 3 pounds in one day or 5 pounds in one week call your doctor; denies shortness of breath or swelling  Plan: Continue current medications   Chronic Kidney Disease    GFR: 47 ml/min Scr: 1.52  Adj BW: 117 kg CrCl: 86 ml/min   Patient is currently on the following medications: No pharmacotherapy  Plan: Continue current medications; No dose adjustments recommended.  Vaccines   Reviewed and discussed patient's vaccination history.    Immunization History  Administered Date(s) Administered  . Influenza Whole 10/16/2007, 10/14/2009  . Influenza, Seasonal, Injecte, Preservative Fre 02/16/2012  . Influenza,inj,Quad PF,6+ Mos 11/26/2012, 11/11/2013, 02/04/2015, 02/23/2016, 01/19/2017, 10/30/2017, 09/12/2018  . PFIZER SARS-COV-2 Vaccination 03/28/2019, 04/22/2019  . Pneumococcal Conjugate-13 02/23/2016  . Pneumococcal Polysaccharide-23 10/14/2009  . Tdap 05/03/2011   Plan: Recommended patient receive Shingrix.  SDOH:   Emergency planning/management officer Strain: Low Risk   . Difficulty of  Paying Living Expenses: Not hard at all   Medication Management  OTCs: multivitamin, Tums - calcium daily, Bariatric multivitamin chewable - twice a day  Pharmacy/Benefits: [facility] - mail order 90 DS, synchronized ($0 copays, Xarelto $200-300 90DS)  Adherence: no concerns:   Affordability: no concerns   CCM Follow Up: 10/31/19 at 11 AM (telephone)  Debbora Dus, PharmD Clinical Pharmacist [facility] at [facility] [phone]

## 2019-04-29 NOTE — Patient Instructions (Addendum)
April 29, 2019  Dear Craig Lindsey,  It was a pleasure meeting you during our initial appointment on April 29, 2019. Below is a summary of the goals we discussed and components of chronic care management. Please contact me anytime with questions or concerns.   Visit Information  Goals    . Pharmacy Care Plan     CARE PLAN ENTRY  Current Barriers:  . Chronic Disease Management support, education, and care coordination needs related to hypertension, heart failure  Pharmacist Clinical Goal(s):  Marland Kitchen Over the next 6 months, patient will work with PharmD and primary care provider to address the following goals:  . Hypertension: Maintain blood pressure within goal of less than 140/90 mmHg. Continue current medications as prescribed. Call if you notice any blood pressure elevations. . Diabetes: Maintain A1c less than 7% and fasting blood glucose goal of 80-130 mg/dL. Continue current medications as prescribed and checking blood glucose once daily.  Marland Kitchen Heart failure: Prevent exacerbation or fluid overload. Check weight daily in the morning; If you gain more than 3 pounds overnight, please call. . Atrial fibrillation: Compare cost of Xarelto for patient with current pharmacy. . Vaccinations: Recommend 2-dose series of Shingrix vaccine.  Interventions: . Comprehensive medication review performed . Discussed recommended vaccinations and goals for blood pressure and diabetes.  Patient Self Care Activities:  . Self administers medications as prescribed . Self-monitors blood pressure and blood glucose  Initial goal documentation      Craig Lindsey was given information about Chronic Care Management services today including:  1. CCM service includes personalized support from designated clinical staff supervised by his physician, including individualized plan of care and coordination with other care providers 2. 24/7 contact phone numbers for assistance for urgent and routine care  needs. 3. Standard insurance, coinsurance, copays and deductibles apply for chronic care management only during months in which we provide at least 20 minutes of these services. Most insurances cover these services at 100%, however patients may be responsible for any copay, coinsurance and/or deductible if applicable. This service may help you avoid the need for more expensive face-to-face services. 4. Only one practitioner may furnish and bill the service in a calendar month. 5. The patient may stop CCM services at any time (effective at the end of the month) by phone call to the office staff.  Patient agreed to services and verbal consent obtained.   The patient verbalized understanding of instructions provided today and agreed to receive a mailed copy of patient instruction and/or educational materials. Telephone follow up appointment with pharmacy team member scheduled for: 10/31/19 at 11 AM (telephone)  Debbora Dus, PharmD Clinical Pharmacist [facility] at [facility] [phone]  Zoster Vaccine, Recombinant injection What is this medicine? ZOSTER VACCINE (ZOS ter vak SEEN) is used to prevent shingles in adults 59 years old and over. This vaccine is not used to treat shingles or nerve pain from shingles. This medicine may be used for other purposes; ask your health care provider or pharmacist if you have questions. COMMON BRAND NAME(S): [facility] What should I tell my health care provider before I take this medicine? They need to know if you have any of these conditions:  blood disorders or disease  cancer like leukemia or lymphoma  immune system problems or therapy  an unusual or allergic reaction to vaccines, other medications, foods, dyes, or preservatives  pregnant or trying to get pregnant  breast-feeding How should I use this medicine? This vaccine is for injection  in a muscle. It is given by a health care professional. Talk to your pediatrician regarding  the use of this medicine in children. This medicine is not approved for use in children. Overdosage: If you think you have taken too much of this medicine contact a poison control center or emergency room at once. NOTE: This medicine is only for you. Do not share this medicine with others. What if I miss a dose? Keep appointments for follow-up (booster) doses as directed. It is important not to miss your dose. Call your doctor or health care professional if you are unable to keep an appointment. What may interact with this medicine?  medicines that suppress your immune system  medicines to treat cancer  steroid medicines like prednisone or cortisone This list may not describe all possible interactions. Give your health care provider a list of all the medicines, herbs, non-prescription drugs, or dietary supplements you use. Also tell them if you smoke, drink alcohol, or use illegal drugs. Some items may interact with your medicine. What should I watch for while using this medicine? Visit your doctor for regular check ups. This vaccine, like all vaccines, may not fully protect everyone. What side effects may I notice from receiving this medicine? Side effects that you should report to your doctor or health care professional as soon as possible:  allergic reactions like skin rash, itching or hives, swelling of the face, lips, or tongue  breathing problems Side effects that usually do not require medical attention (report these to your doctor or health care professional if they continue or are bothersome):  chills  headache  fever  nausea, vomiting  redness, warmth, pain, swelling or itching at site where injected  tiredness This list may not describe all possible side effects. Call your doctor for medical advice about side effects. You may report side effects to FDA at 1-800-FDA-1088. Where should I keep my medicine? This vaccine is only given in a clinic, pharmacy, doctor's office,  or other health care setting and will not be stored at home. NOTE: This sheet is a summary. It may not cover all possible information. If you have questions about this medicine, talk to your doctor, pharmacist, or health care provider.  2020 Elsevier/Gold Standard (2016-08-14 13:20:30)

## 2019-04-29 NOTE — Telephone Encounter (Signed)
Sent rx to [facility]

## 2019-04-29 NOTE — Telephone Encounter (Signed)
Patient would like to check price of Xarelto at [facility]. Could you send a prescription refill?  Thanks!  Craig Lindsey, PharmD Clinical Pharmacist [facility] at [facility] [phone]

## 2019-04-29 NOTE — Telephone Encounter (Signed)
Send it even though we don't know the price? Please figure this out--okay to refill for a year at the new pharmacy if he wants

## 2019-05-18 ENCOUNTER — Other Ambulatory Visit: Payer: Self-pay | Admitting: Internal Medicine

## 2019-06-26 DIAGNOSIS — Z0100 Encounter for examination of eyes and vision without abnormal findings: Secondary | ICD-10-CM | POA: Diagnosis not present

## 2019-07-14 ENCOUNTER — Ambulatory Visit (INDEPENDENT_AMBULATORY_CARE_PROVIDER_SITE_OTHER): Payer: Medicare HMO | Admitting: *Deleted

## 2019-07-14 DIAGNOSIS — I472 Ventricular tachycardia, unspecified: Secondary | ICD-10-CM

## 2019-07-14 DIAGNOSIS — I480 Paroxysmal atrial fibrillation: Secondary | ICD-10-CM | POA: Diagnosis not present

## 2019-07-15 LAB — CUP PACEART REMOTE DEVICE CHECK
Battery Remaining Longevity: 118 mo
Battery Voltage: 3.02 V
Brady Statistic RV Percent Paced: 10.57 %
Date Time Interrogation Session: 20210628022823
HighPow Impedance: 56 Ohm
HighPow Impedance: 73 Ohm
Implantable Lead Implant Date: 20050113
Implantable Lead Location: 753860
Implantable Lead Model: 6949
Implantable Pulse Generator Implant Date: 20190328
Lead Channel Impedance Value: 513 Ohm
Lead Channel Impedance Value: 608 Ohm
Lead Channel Pacing Threshold Amplitude: 0.875 V
Lead Channel Pacing Threshold Pulse Width: 0.4 ms
Lead Channel Sensing Intrinsic Amplitude: 11.125 mV
Lead Channel Sensing Intrinsic Amplitude: 11.125 mV
Lead Channel Setting Pacing Amplitude: 2.5 V
Lead Channel Setting Pacing Pulse Width: 0.4 ms
Lead Channel Setting Sensing Sensitivity: 0.3 mV

## 2019-07-16 NOTE — Progress Notes (Signed)
Remote ICD transmission.   

## 2019-07-18 DIAGNOSIS — G4733 Obstructive sleep apnea (adult) (pediatric): Secondary | ICD-10-CM | POA: Diagnosis not present

## 2019-09-17 ENCOUNTER — Telehealth (INDEPENDENT_AMBULATORY_CARE_PROVIDER_SITE_OTHER): Payer: Medicare HMO | Admitting: Family Medicine

## 2019-09-17 ENCOUNTER — Other Ambulatory Visit: Payer: Self-pay

## 2019-09-17 ENCOUNTER — Encounter: Payer: Self-pay | Admitting: Family Medicine

## 2019-09-17 ENCOUNTER — Ambulatory Visit: Payer: Self-pay

## 2019-09-17 DIAGNOSIS — U071 COVID-19: Secondary | ICD-10-CM | POA: Insufficient documentation

## 2019-09-17 NOTE — Assessment & Plan Note (Signed)
Mild symptoms developed on 8/21 after exp to covid in family members  He was immunized Tested pos on 8/28 after several initial neg tests  Got through headache/nasal symptoms and cough- now pretty much resolved  No fever  Some loss of taste /smell inst to isolate until better  Too late in course for antibody infusion (and symptoms are better)  inst to call if no further improvement or if return of symptoms or development of new ones (like sob or fever)

## 2019-09-17 NOTE — Patient Instructions (Signed)
Drink fluids Rest when you need it  Treat your symptoms and watch temperature  Isolate until symptoms are gone  If anything worsens again or new symptoms develop please call us

## 2019-09-17 NOTE — Progress Notes (Deleted)
   Subjective:    Patient ID: Craig Lindsey, male    DOB: [birth year], 59 y.o.   MRN: [id number]  This visit occurred during the SARS-CoV-2 public health emergency.  Safety protocols were in place, including screening questions prior to the visit, additional usage of staff PPE, and extensive cleaning of exam room while observing appropriate contact time as indicated for disinfecting solutions.    HPI    Review of Systems     Objective:   Physical Exam        Assessment & Plan:

## 2019-09-17 NOTE — Progress Notes (Signed)
Patient ID: ADON GEHLHAUSEN, male   DOB: [birth year], 59 y.o.   MRN: [id number] Virtual Visit via Video Note  I connected with [name] on 09/17/19 at  3:00 PM EDT by a video enabled telemedicine application and verified that I am speaking with the correct person using two identifiers.  Location: Patient: home Provider: office    I discussed the limitations of evaluation and management by telemedicine and the availability of in person appointments. The patient expressed understanding and agreed to proceed.  Parties involved in encounter  Patient: Craig Lindsey   Provider:  Loura Pardon MD   History of Present Illness: 59 yo pt of Dr [name] presents with covid symptoms  Positive test was aug 28th -had been exp to family with covid on aug 16th   He started symptoms on 21st Runny nose and headache and slight cough  First 2 tests were negative   Did not get very sick  Slight cough for a couple of days No shortness of breath  Headache 3-4 d-has also stopped  Never had a fever  Did loose taste and smell   Today feels just a little tired  Has been house cleaning  Wife wanted him to check in    He was vaccinated with pfizer vaccine in march/april   Has a history of cardiac problems- all stable      Patient Active Problem List   Diagnosis Date Noted   COVID-19 09/17/2019   Thrombocytopenia ([place]) 03/11/2018   Advance directive discussed with patient 03/11/2018   Morbid obesity ([place]) 04/30/2017   Sleep apnea    Chronic diastolic heart failure ([place]) 02/03/2014   Diabetic nephropathy associated with type 2 diabetes mellitus ([place]) 11/11/2013   Chronic renal disease, stage III ([place]) 11/11/2013   Personal history of colonic polyps 01/13/2013   Ventral hernia 03/29/2011   Routine general medical examination at a health care facility 09/13/2010   Type 2 diabetes, controlled, with neuropathy ([place]) 03/16/2010   OSTEOARTHRITIS, KNEES, BILATERAL 03/16/2010    Automatic implantable cardioverter-defibrillator in situ 07/16/2009   ADENOCARCINOMA, COLON, HX OF, T3, N1, Sigmoid colectomy 02/13/2007. 05/31/2009   Personal history of colon cancer, stage III 01/17/2007   Hyperlipidemia 06/19/2006   Essential hypertension 06/19/2006   HYPERSOMNIA, ASSOCIATED WITH SLEEP APNEA 06/19/2006   Atrial fibrillation ([place]) 05/23/2006   Ventricular tachycardia ([place]) 12/17/2002   Past Medical History:  Diagnosis Date   Arrhythmia 12/04   1. Ventricular tachycardia; Dr. [name]  2. Atrial fibrillation   Arthritis    knees   Chronic kidney disease    Complex L renal cyst, no tx for    Diabetes mellitus    Type II   DVT (deep venous thrombosis) ([place]) 7/09   RLE, Tx Arixtra   Dysrhythmia    v fib and a fib has icd   Hemorrhoids    hx of   Hyperlipidemia    Hypertension    Neuropathy    Oxaliplatin  from cancer treatment   Obesity    Personal history of colon cancer, stage III 1/09   Dr. Carron Brazen, D. [place]   Sigmoid colon ulcer 5 yrs ago   Sigmoid; DLucia Gaskins, MD (colon cancer)   Sleep apnea    uses cpap setting of 44.8   Umbilical hernia    Past Surgical History:  Procedure Laterality Date   CARDIAC DEFIBRILLATOR PLACEMENT     2004   CHOLECYSTECTOMY     COLECTOMY  1/09   Sigmoid; D.  Lucia Gaskins, MD (colon cancer)   COLONOSCOPY     COLONOSCOPY N/A 02/25/2013   Procedure: COLONOSCOPY;  Surgeon: Gatha Mayer, MD;  Location: [facility];  Service: Endoscopy;  Laterality: N/A;   HERNIA REPAIR  yrs ago   umb hernia   ICD GENERATOR CHANGEOUT N/A 04/12/2017   Procedure: [place];  Surgeon: Evans Lance, MD;  Location: [facility];  Service: Cardiovascular;  Laterality: N/A;   LAPAROSCOPIC GASTRIC SLEEVE RESECTION     Dr. Lucia Gaskins 04-30-17   LAPAROSCOPIC GASTRIC SLEEVE RESECTION N/A 04/30/2017   Procedure: LAPAROSCOPIC GASTRIC SLEEVE RESECTION WITH UPPER ENDO;  Surgeon: Alphonsa Overall, MD;   Location: [facility];  Service: General;  Laterality: N/A;   Social History   Tobacco Use   Smoking status: Never Smoker   Smokeless tobacco: Never Used  Vaping Use   Vaping Use: Never used  Substance Use Topics   Alcohol use: No    Alcohol/week: 0.0 standard drinks   Drug use: No   Family History  Problem Relation Age of Onset   Diabetes Father    Cancer Father        myelofibrosis   Colon cancer Other    Diabetes Maternal Grandmother    Cancer Maternal Aunt        colon   Hypertension Neg Hx    Coronary artery disease Neg Hx    Prostate cancer Neg Hx    Esophageal cancer Neg Hx    Rectal cancer Neg Hx    Stomach cancer Neg Hx    No Known Allergies Current Outpatient Medications on File Prior to Visit  Medication Sig Dispense Refill   ACCU-CHEK FASTCLIX LANCETS MISC Use to check blood sugar three times a day.  Dx: E11.40 306 each 3   ACCU-CHEK SMARTVIEW test strip USE AS INSTRUCTED TO TEST BLOOD SUGAR THREE TIMES DAILY 300 each 12   amiodarone (PACERONE) 200 MG tablet TAKE 1 TABLET (200 MG TOTAL) BY MOUTH DAILY. 90 tablet 3   atorvastatin (LIPITOR) 10 MG tablet TAKE 1 TABLET (10 MG TOTAL) BY MOUTH DAILY AT 6 PM. 90 tablet 3   Blood Pressure Monitor DEVI      calcium carbonate (TUMS - DOSED IN MG ELEMENTAL CALCIUM) 500 MG chewable tablet Chew 1 tablet by mouth 2 (two) times daily.      DILT-XR 240 MG 24 hr capsule TAKE 1 CAPSULE (240 MG TOTAL) BY MOUTH EVERY MORNING. 90 capsule 3   furosemide (LASIX) 40 MG tablet Take 1 tablet (40 mg total) by mouth daily. 135 tablet 3   glipiZIDE (GLUCOTROL XL) 2.5 MG 24 hr tablet TAKE 1 TABLET (2.5 MG TOTAL) BY MOUTH DAILY WITH BREAKFAST. 90 tablet 3   lisinopril (ZESTRIL) 40 MG tablet TAKE 1 TABLET (40 MG TOTAL) BY MOUTH DAILY. 90 tablet 3   metoprolol succinate (TOPROL-XL) 100 MG 24 hr tablet TAKE 2 TABLETS (200 MG TOTAL) BY MOUTH DAILY. 180 tablet 3   Multiple Vitamins-Minerals (BARIATRIC MULTIVITAMINS/IRON PO)  Take by mouth.     rivaroxaban (XARELTO) 20 MG TABS tablet TAKE 1 TABLET EVERY DAY WITH SUPPER 30 tablet 11   No current facility-administered medications on file prior to visit.    Review of Systems  Constitutional: Negative for chills, fever and malaise/fatigue.       Fatigue and other symptoms are improved  HENT: Negative for congestion, ear pain, sinus pain and sore throat.   Eyes: Negative for blurred vision, discharge and redness.  Respiratory: Negative  for cough, shortness of breath and stridor.   Cardiovascular: Negative for chest pain, palpitations and leg swelling.  Gastrointestinal: Negative for abdominal pain, diarrhea, nausea and vomiting.  Musculoskeletal: Negative for myalgias.  Skin: Negative for rash.  Neurological: Positive for headaches. Negative for dizziness.       Last headache was yesterday    Observations/Objective: Patient appears well, in no distress Weight is baseline  No facial swelling or asymmetry Normal voice-not hoarse and no slurred speech No obvious tremor or mobility impairment Moving neck and UEs normally Able to hear the call well  No cough or shortness of breath during interview  Talkative and mentally sharp with no cognitive changes No skin changes on face or neck , no rash or pallor Affect is normal    Assessment and Plan: Problem List Items Addressed This Visit      Other   COVID-19    Mild symptoms developed on 8/21 after exp to covid in family members  He was immunized Tested pos on 8/28 after several initial neg tests  Got through headache/nasal symptoms and cough- now pretty much resolved  No fever  Some loss of taste /smell inst to isolate until better  Too late in course for antibody infusion (and symptoms are better)  inst to call if no further improvement or if return of symptoms or development of new ones (like sob or fever)            Follow Up Instructions: Drink fluids Rest when you need it  Treat your  symptoms and watch temperature  Isolate until symptoms are gone  If anything worsens again or new symptoms develop please call us     I discussed the assessment and treatment plan with the patient. The patient was provided an opportunity to ask questions and all were answered. The patient agreed with the plan and demonstrated an understanding of the instructions.   The patient was advised to call back or seek an in-person evaluation if the symptoms worsen or if the condition fails to improve as anticipated.     Loura Pardon, MD

## 2019-09-25 ENCOUNTER — Encounter: Payer: Self-pay | Admitting: Internal Medicine

## 2019-09-25 ENCOUNTER — Other Ambulatory Visit: Payer: Self-pay

## 2019-09-25 ENCOUNTER — Ambulatory Visit (INDEPENDENT_AMBULATORY_CARE_PROVIDER_SITE_OTHER): Payer: Medicare HMO | Admitting: Internal Medicine

## 2019-09-25 VITALS — BP 120/64 | HR 93 | Temp 98.1°F | Ht 73.0 in | Wt 375.0 lb

## 2019-09-25 DIAGNOSIS — Z23 Encounter for immunization: Secondary | ICD-10-CM

## 2019-09-25 DIAGNOSIS — I5032 Chronic diastolic (congestive) heart failure: Secondary | ICD-10-CM | POA: Diagnosis not present

## 2019-09-25 DIAGNOSIS — E1140 Type 2 diabetes mellitus with diabetic neuropathy, unspecified: Secondary | ICD-10-CM | POA: Diagnosis not present

## 2019-09-25 LAB — POCT GLYCOSYLATED HEMOGLOBIN (HGB A1C): Hemoglobin A1C: 7.2 % — AB (ref 4.0–5.6)

## 2019-09-25 MED ORDER — SEMAGLUTIDE 3 MG PO TABS: 3.0000 mg | ORAL_TABLET | Freq: Every day | ORAL | 11 refills | Status: DC

## 2019-09-25 NOTE — Assessment & Plan Note (Signed)
Lab Results  Component Value Date   HGBA1C 7.2 (A) 09/25/2019   Still good control just on glipizide Discussed incretin therapy for this and weight loss--he is interested if affordable

## 2019-09-25 NOTE — Patient Instructions (Signed)
Try the semaglutide for diabetes and weight loss---we may need to increase the dose if you have no problems with this. If you have low sugar reactions, stop the glipizide

## 2019-09-25 NOTE — Progress Notes (Signed)
Subjective:    Patient ID: Craig Lindsey, male    DOB: 11-13-1960, 59 y.o.   MRN: [id number]  HPI Here for follow up of diabetes and other chronic health conditions This visit occurred during the SARS-CoV-2 public health emergency.  Safety protocols were in place, including screening questions prior to the visit, additional usage of staff PPE, and extensive cleaning of exam room while observing appropriate contact time as indicated for disinfecting solutions.   Did have COVID --tested positive 8/21 Mild symptoms only ---brief cough, headache, diarrhea for 7-8 days Still with abnormal taste/smell  Checks sugars 2-3 times a week Fasting usually in 140's No low sugar reactions Neuropathy is not too bad---and not worse  No chest pain No SOB No increased edema Did have brief palpitations when he was sick Sleeps with CPAP in bed. No orthopnea  Current Outpatient Medications on File Prior to Visit  Medication Sig Dispense Refill  . ACCU-CHEK FASTCLIX LANCETS MISC Use to check blood sugar three times a day.  Dx: E11.40 306 each 3  . ACCU-CHEK SMARTVIEW test strip USE AS INSTRUCTED TO TEST BLOOD SUGAR THREE TIMES DAILY 300 each 12  . amiodarone (PACERONE) 200 MG tablet TAKE 1 TABLET (200 MG TOTAL) BY MOUTH DAILY. 90 tablet 3  . atorvastatin (LIPITOR) 10 MG tablet TAKE 1 TABLET (10 MG TOTAL) BY MOUTH DAILY AT 6 PM. 90 tablet 3  . Blood Pressure Monitor DEVI     . calcium carbonate (TUMS - DOSED IN MG ELEMENTAL CALCIUM) 500 MG chewable tablet Chew 1 tablet by mouth 2 (two) times daily.     Marland Kitchen DILT-XR 240 MG 24 hr capsule TAKE 1 CAPSULE (240 MG TOTAL) BY MOUTH EVERY MORNING. 90 capsule 3  . furosemide (LASIX) 40 MG tablet Take 1 tablet (40 mg total) by mouth daily. 135 tablet 3  . glipiZIDE (GLUCOTROL XL) 2.5 MG 24 hr tablet TAKE 1 TABLET (2.5 MG TOTAL) BY MOUTH DAILY WITH BREAKFAST. 90 tablet 3  . lisinopril (ZESTRIL) 40 MG tablet TAKE 1 TABLET (40 MG TOTAL) BY MOUTH DAILY. 90 tablet 3  .  metoprolol succinate (TOPROL-XL) 100 MG 24 hr tablet TAKE 2 TABLETS (200 MG TOTAL) BY MOUTH DAILY. 180 tablet 3  . Multiple Vitamins-Minerals (BARIATRIC MULTIVITAMINS/IRON PO) Take by mouth.    . rivaroxaban (XARELTO) 20 MG TABS tablet TAKE 1 TABLET EVERY DAY WITH SUPPER 30 tablet 11   No current facility-administered medications on file prior to visit.    No Known Allergies  Past Medical History:  Diagnosis Date  . Arrhythmia 12/04   1. Ventricular tachycardia; Dr. [name]  2. Atrial fibrillation  . Arthritis    knees  . Chronic kidney disease    Complex L renal cyst, no tx for   . Diabetes mellitus    Type II  . DVT (deep venous thrombosis) ([place]) 7/09   RLE, Tx Arixtra  . Dysrhythmia    v fib and a fib has icd  . Hemorrhoids    hx of  . Hyperlipidemia   . Hypertension   . 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 Obesity   . Personal history of colon cancer, stage III 1/09   Dr. Carlean Purl, Benay Spice, D. [place]  . Sigmoid colon ulcer 5 yrs ago   Sigmoid; Nydia Bouton, MD (colon cancer)  . Sleep apnea    uses cpap setting of 11.2  . Umbilical hernia     Past Surgical History:  Procedure Laterality  Date  . CARDIAC DEFIBRILLATOR PLACEMENT     2004  . CHOLECYSTECTOMY    . COLECTOMY  1/09   Sigmoid; Nydia Bouton, MD (colon cancer)  . COLONOSCOPY    . COLONOSCOPY N/A 02/25/2013   Procedure: COLONOSCOPY;  Surgeon: Gatha Mayer, MD;  [facility];  Service: Endoscopy;  Laterality: N/A;  . HERNIA REPAIR  yrs ago   umb hernia  . ICD GENERATOR CHANGEOUT N/A 04/12/2017   Procedure: [place];  Surgeon: Evans Lance, MD;  Location: [facility];  Service: Cardiovascular;  Laterality: N/A;  . LAPAROSCOPIC GASTRIC SLEEVE RESECTION     Dr. Lucia Gaskins 04-30-17  . LAPAROSCOPIC GASTRIC SLEEVE RESECTION N/A 04/30/2017   Procedure: LAPAROSCOPIC GASTRIC SLEEVE RESECTION WITH UPPER ENDO;  Surgeon: Alphonsa Overall, MD;  Location: [facility];  Service: General;   Laterality: N/A;    Family History  Problem Relation Age of Onset  . Diabetes Father   . Cancer Father        myelofibrosis  . Colon cancer Other   . Diabetes Maternal Grandmother   . Cancer Maternal Aunt        colon  . Hypertension Neg Hx   . Coronary artery disease Neg Hx   . Prostate cancer Neg Hx   . Esophageal cancer Neg Hx   . Rectal cancer Neg Hx   . Stomach cancer Neg Hx     Social History   Socioeconomic History  . Marital status: Married    Spouse name: Not on file  . Number of children: 2  . Years of education: Not on file  . Highest education level: Not on file  Occupational History  . Occupation: Chartered certified accountant    Comment: Disabled  Tobacco Use  . Smoking status: Never Smoker  . Smokeless tobacco: Never Used  Vaping Use  . Vaping Use: Never used  Substance and Sexual Activity  . Alcohol use: No    Alcohol/week: 0.0 standard drinks  . Drug use: No  . Sexual activity: Yes  Other Topics Concern  . Not on file  Social History Narrative   2 step children from wife's 2nd marriage.      Has living will   Wife is health care POA---alternate would be sister, Vickie Epley   Would accept resuscitation attempts   Not sure about tube feedings   Social Determinants of Health   Financial Resource Strain: Low Risk   . Difficulty of Paying Living Expenses: Not hard at all  Food Insecurity:   . Worried About Charity fundraiser in the Last Year: Not on file  . Ran Out of Food in the Last Year: Not on file  Transportation Needs:   . Lack of Transportation (Medical): Not on file  . Lack of Transportation (Non-Medical): Not on file  Physical Activity:   . Days of Exercise per Week: Not on file  . Minutes of Exercise per Session: Not on file  Stress:   . Feeling of Stress : Not on file  Social Connections:   . Frequency of Communication with Friends and Family: Not on file  . Frequency of Social Gatherings with Friends and Family: Not on file  .  Attends Religious Services: Not on file  . Active Member of Clubs or Organizations: Not on file  . Attends Archivist Meetings: Not on file  . Marital Status: Not on file  Intimate Partner Violence:   . Fear of Current or  Ex-Partner: Not on file  . Emotionally Abused: Not on file  . Physically Abused: Not on file  . Sexually Abused: Not on file   Review of Systems Appetite is okay Weight stable Lost about 100# from the surgery but has leveled off    Objective:   Physical Exam Cardiovascular:     Rate and Rhythm: Normal rate and regular rhythm.     Pulses: Normal pulses.     Heart sounds: No murmur heard.  No gallop.   Pulmonary:     Effort: Pulmonary effort is normal.     Breath sounds: Normal breath sounds. No wheezing or rales.  Musculoskeletal:     Right lower leg: No edema.     Left lower leg: No edema.  Lymphadenopathy:     Cervical: No cervical adenopathy.  Skin:    Comments: No foot lesions  Neurological:     Mental Status: He is alert.  Psychiatric:        Mood and Affect: Mood normal.        Behavior: Behavior normal.            Assessment & Plan:

## 2019-09-25 NOTE — Addendum Note (Signed)
Addended by: Pilar Grammes on: 09/25/2019 03:28 PM   Modules accepted: Orders

## 2019-09-25 NOTE — Assessment & Plan Note (Signed)
Discussed semaglutide---will try low dose (3mg ) Warned to be aware of hypoglycemia

## 2019-09-25 NOTE — Assessment & Plan Note (Signed)
Compensated on lisinopril, metoprolol, diltiazem

## 2019-09-29 ENCOUNTER — Telehealth: Payer: Self-pay | Admitting: Internal Medicine

## 2019-09-29 NOTE — Telephone Encounter (Signed)
Patient returned call he states he called [employer] since he was having trouble with his home monitor and states that they are sending him a new handheld and it will take 7-10 days and wants to know if he can make an appointment to come in and get checked. Please call patient back

## 2019-09-29 NOTE — Telephone Encounter (Signed)
Patient wants to know if he can send in a transmission because he feels like his heart is beating really fast. Please advise.

## 2019-09-29 NOTE — Telephone Encounter (Signed)
LMOVM (DPR) requesting call back to DC.  Direct number and office hours provided.

## 2019-09-29 NOTE — Telephone Encounter (Signed)
Able to reach pt.  He reports that on Saturday he noticed his heart beat more than usual.  HR normal each time he checked it.  States "it feels like when they pace your heart" during a device check, feels uncomfortable.  Reports that yesterday and today have been better.  Denies chest pain, SOB, dizziness, or other symptoms.  As of last check on 07/14/19, VP 11% at [facility] 50bpm.  Pt requests in-clinic appointment to assess ICD, due for annual visit in August per recall.  Pt accepted appointment on 10/01/19 at 10:20 with Genene Churn, NP.  Aware of office location.  Pt denies additional questions at this time.

## 2019-09-29 NOTE — Telephone Encounter (Signed)
Responded to pt via MyChart.  Will await transmission.

## 2019-09-29 NOTE — Telephone Encounter (Signed)
The pt tried sending the transmission. His monitor gave him the error code 3248. He called tech support and they are sending him a new monitor. He wants to know can he come into the office to get his device check because he feels like his heart is pounding.

## 2019-09-30 NOTE — Progress Notes (Signed)
Electrophysiology Office Note Date: 10/01/2019  ID:  Craig Lindsey, DOB 1960/02/06, MRN [id number]  PCP: Craig Carbon, MD Electrophysiologist: Craig Lindsey  CC: Routine ICD follow-up  Craig Lindsey is a 59 y.o. male seen today for Dr Craig Lindsey.  He presents today for routine electrophysiology followup.  Since last being seen in our clinic, the patient reports doing reasonably well.  He had COVID 2.5 weeks ago but only a mild case (fully vaccinated). He had an episode over the weekend that lasted 4-5 hours where his heart was "pounding".  He checked his BP and HR - BP was normal, HR was 61.  He had no awareness of skipped beats or irregularity. No other associated symptoms.  He denies chest pain, dyspnea, PND, orthopnea, nausea, vomiting, dizziness, syncope, edema, weight gain, or early satiety.  He has not had ICD shocks.     Past Medical History:  Diagnosis Date  . Arrhythmia 12/04   1. Ventricular tachycardia; Dr. Lovena Lindsey  2. Atrial fibrillation  . Arthritis    knees  . Chronic kidney disease    Complex L renal cyst, no tx for   . Diabetes mellitus    Type II  . DVT (deep venous thrombosis) ([place]) 7/09   RLE, Tx Arixtra  . Dysrhythmia    v fib and a fib has icd  . Hemorrhoids    hx of  . Hyperlipidemia   . Hypertension   . 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 Obesity   . Personal history of colon cancer, stage III 1/09   Dr. Carlean Lindsey, Craig Lindsey, Craig Lindsey  . Sigmoid colon ulcer 5 yrs ago   Sigmoid; Nydia Bouton, MD (colon cancer)  . Sleep apnea    uses cpap setting of 11.2  . Umbilical hernia    Past Surgical History:  Procedure Laterality Date  . CARDIAC DEFIBRILLATOR PLACEMENT     2004  . CHOLECYSTECTOMY    . COLECTOMY  1/09   Sigmoid; Nydia Bouton, MD (colon cancer)  . COLONOSCOPY    . COLONOSCOPY N/A 02/25/2013   Procedure: COLONOSCOPY;  Surgeon: Craig Mayer, MD;  Location: [facility];  Service: Endoscopy;  Laterality: N/A;  . HERNIA REPAIR   yrs ago   umb hernia  . ICD GENERATOR CHANGEOUT N/A 04/12/2017   Procedure: [place];  Surgeon: Evans Lance, MD;  Location: [facility];  Service: Cardiovascular;  Laterality: N/A;  . LAPAROSCOPIC GASTRIC SLEEVE RESECTION     Dr. Lucia Lindsey 04-30-17  . LAPAROSCOPIC GASTRIC SLEEVE RESECTION N/A 04/30/2017   Procedure: LAPAROSCOPIC GASTRIC SLEEVE RESECTION WITH UPPER ENDO;  Surgeon: Craig Overall, MD;  Location: [facility];  Service: General;  Laterality: N/A;    Current Outpatient Medications  Medication Sig Dispense Refill  . ACCU-CHEK FASTCLIX LANCETS MISC Use to check blood sugar three times a day.  Dx: E11.40 306 each 3  . ACCU-CHEK SMARTVIEW test strip USE AS INSTRUCTED TO TEST BLOOD SUGAR THREE TIMES DAILY 300 each 12  . amiodarone (PACERONE) 200 MG tablet TAKE 1 TABLET (200 MG TOTAL) BY MOUTH DAILY. 90 tablet 3  . atorvastatin (LIPITOR) 10 MG tablet TAKE 1 TABLET (10 MG TOTAL) BY MOUTH DAILY AT 6 PM. 90 tablet 3  . Blood Pressure Monitor DEVI     . calcium carbonate (TUMS - DOSED IN MG ELEMENTAL CALCIUM) 500 MG chewable tablet Chew 1 tablet by mouth 2 (two) times daily.     Marland Kitchen DILT-XR  240 MG 24 hr capsule TAKE 1 CAPSULE (240 MG TOTAL) BY MOUTH EVERY MORNING. 90 capsule 3  . furosemide (LASIX) 40 MG tablet Take 1 tablet (40 mg total) by mouth daily. 135 tablet 3  . glipiZIDE (GLUCOTROL XL) 2.5 MG 24 hr tablet TAKE 1 TABLET (2.5 MG TOTAL) BY MOUTH DAILY WITH BREAKFAST. 90 tablet 3  . lisinopril (ZESTRIL) 40 MG tablet TAKE 1 TABLET (40 MG TOTAL) BY MOUTH DAILY. 90 tablet 3  . metoprolol succinate (TOPROL-XL) 100 MG 24 hr tablet TAKE 2 TABLETS (200 MG TOTAL) BY MOUTH DAILY. 180 tablet 3  . Multiple Vitamins-Minerals (BARIATRIC MULTIVITAMINS/IRON PO) Take by mouth.    . rivaroxaban (XARELTO) 20 MG TABS tablet TAKE 1 TABLET EVERY DAY WITH SUPPER 30 tablet 11  . Semaglutide 3 MG TABS Take 3 mg by mouth daily. 30 tablet 11   No current facility-administered medications for this  visit.    Allergies:   Patient has no known allergies.   Social History: Social History   Socioeconomic History  . Marital status: Married    Spouse name: Not on file  . Number of children: 2  . Years of education: Not on file  . Highest education level: Not on file  Occupational History  . Occupation: Chartered certified accountant    Comment: Disabled  Tobacco Use  . Smoking status: Never Smoker  . Smokeless tobacco: Never Used  Vaping Use  . Vaping Use: Never used  Substance and Sexual Activity  . Alcohol use: No    Alcohol/week: 0.0 standard drinks  . Drug use: No  . Sexual activity: Yes  Other Topics Concern  . Not on file  Social History Narrative   2 step children from wife's 2nd marriage.      Has living will   Wife is health care POA---alternate would be sister, Craig Lindsey   Would accept resuscitation attempts   Not sure about tube feedings   Social Determinants of Health   Financial Resource Strain: Low Risk   . Difficulty of Paying Living Expenses: Not hard at all  Food Insecurity:   . Worried About Charity fundraiser in the Last Year: Not on file  . Ran Out of Food in the Last Year: Not on file  Transportation Needs:   . Lack of Transportation (Medical): Not on file  . Lack of Transportation (Non-Medical): Not on file  Physical Activity:   . Days of Exercise per Week: Not on file  . Minutes of Exercise per Session: Not on file  Stress:   . Feeling of Stress : Not on file  Social Connections:   . Frequency of Communication with Friends and Family: Not on file  . Frequency of Social Gatherings with Friends and Family: Not on file  . Attends Religious Services: Not on file  . Active Member of Clubs or Organizations: Not on file  . Attends Archivist Meetings: Not on file  . Marital Status: Not on file  Intimate Partner Violence:   . Fear of Current or Ex-Partner: Not on file  . Emotionally Abused: Not on file  . Physically Abused: Not on  file  . Sexually Abused: Not on file    Family History: Family History  Problem Relation Age of Onset  . Diabetes Father   . Cancer Father        myelofibrosis  . Colon cancer Other   . Diabetes Maternal Grandmother   . Cancer Maternal Aunt  colon  . Hypertension Neg Hx   . Coronary artery disease Neg Hx   . Prostate cancer Neg Hx   . Esophageal cancer Neg Hx   . Rectal cancer Neg Hx   . Stomach cancer Neg Hx     Review of Systems: All other systems reviewed and are otherwise negative except as noted above.   Physical Exam: VS:  BP (!) 164/76   Pulse 73   Ht 6\' 1"  (1.854 m)   Wt (!) 378 lb 3.2 oz (171.6 kg)   SpO2 97%   BMI 49.90 kg/m  , BMI Body mass index is 49.9 kg/m.  GEN- The patient is well appearing, alert and oriented x 3 today.   HEENT: normocephalic, atraumatic; sclera clear, conjunctiva pink; hearing intact; oropharynx clear; neck supple, no JVP Lymph- no cervical lymphadenopathy Lungs- Clear to ausculation bilaterally, normal work of breathing.  No wheezes, rales, rhonchi Heart- Regular rate and rhythm, no murmurs, rubs or gallops, PMI not laterally displaced GI- soft, non-tender, non-distended, bowel sounds present, no hepatosplenomegaly Extremities- no clubbing, cyanosis, or edema; DP/PT/radial pulses 2+ bilaterally MS- no significant deformity or atrophy Skin- warm and dry, no rash or lesion; ICD pocket well healed Psych- euthymic mood, full affect Neuro- strength and sensation are intact  ICD interrogation- reviewed in detail today,  See PACEART report  EKG:  EKG is ordered today. The ekg ordered today shows sinus rhythm, LBBB, rate 73, QRS 166  Recent Labs: 03/24/2019: ALT 27; BUN 25; Creatinine, Ser 1.52; Hemoglobin 16.9; Platelets 151.0; Potassium 4.5; Sodium 138   Wt Readings from Last 3 Encounters:  10/01/19 (!) 378 lb 3.2 oz (171.6 kg)  09/25/19 (!) 375 lb (170.1 kg)  03/24/19 (!) 375 lb (170.1 kg)     Other studies  Reviewed: Additional studies/ records that were reviewed today include: Dr Tanna Furry office notes   Assessment and Plan:  1.  VF Normal ICD function See Pace Art report No changes today No recent ventricular arrhythmias  2.  RV pacing He RV paces 12% of the time. This is stable for him. Lower rate set at 50bpm No change for today  3.  Paroxysmal atrial fibrillation Burden by device interrogation low, no significant recent recurrence Continue Xarelto  No bleeding issues  Continue amiodarone - labs from March reviewed   4.  Diastolic heart failure Stable No change required today    Current medicines are reviewed at length with the patient today.   The patient does not have concerns regarding his medicines.  The following changes were made today:  none  Labs/ tests ordered today include:  Orders Placed This Encounter  Procedures  . EKG 12-Lead     Disposition:   Follow up with remotes, Dr Craig Lindsey 01/2020   Signed, Chanetta Marshall, NP 10/01/2019 11:26 AM  [address] [place] [address] [phone] (office) [phone] (fax)

## 2019-10-01 ENCOUNTER — Encounter: Payer: Self-pay | Admitting: Nurse Practitioner

## 2019-10-01 ENCOUNTER — Ambulatory Visit: Payer: Medicare HMO | Admitting: Nurse Practitioner

## 2019-10-01 ENCOUNTER — Other Ambulatory Visit: Payer: Self-pay

## 2019-10-01 VITALS — BP 164/76 | HR 73 | Ht 73.0 in | Wt 378.2 lb

## 2019-10-01 DIAGNOSIS — I480 Paroxysmal atrial fibrillation: Secondary | ICD-10-CM

## 2019-10-01 DIAGNOSIS — I5032 Chronic diastolic (congestive) heart failure: Secondary | ICD-10-CM | POA: Diagnosis not present

## 2019-10-01 DIAGNOSIS — I4901 Ventricular fibrillation: Secondary | ICD-10-CM

## 2019-10-01 NOTE — Patient Instructions (Signed)
Medication Instructions:  No changes *If you need a refill on your cardiac medications before your next appointment, please call your pharmacy*   Lab Work: none If you have labs (blood work) drawn today and your tests are completely normal, you will receive your results only by: Marland Kitchen MyChart Message (if you have MyChart) OR . A paper copy in the mail If you have any lab test that is abnormal or we need to change your treatment, we will call you to review the results.   Testing/Procedures: none   Follow-Up: At [facility], you and your health needs are our priority.  As part of our continuing mission to provide you with exceptional heart care, we have created designated Provider Care Teams.  These Care Teams include your primary Cardiologist (physician) and Advanced Practice Providers (APPs -  Physician Assistants and Nurse Practitioners) who all work together to provide you with the care you need, when you need it.  Your next appointment:   4 month(s)  The format for your next appointment:   In Person  Provider:   You may see Dr. [name] or one of the following Advanced Practice Providers on your designated Care Team:    Chanetta Marshall, NP  Tommye Standard, PA-C  Legrand Como "Oda [place]ts, [place]    Other Instructions

## 2019-10-13 ENCOUNTER — Ambulatory Visit (INDEPENDENT_AMBULATORY_CARE_PROVIDER_SITE_OTHER): Payer: Medicare HMO | Admitting: Emergency Medicine

## 2019-10-13 DIAGNOSIS — I472 Ventricular tachycardia, unspecified: Secondary | ICD-10-CM

## 2019-10-13 LAB — CUP PACEART REMOTE DEVICE CHECK
Battery Remaining Longevity: 115 mo
Battery Voltage: 3.01 V
Brady Statistic RV Percent Paced: 8.35 %
Date Time Interrogation Session: 20210927031607
HighPow Impedance: 53 Ohm
HighPow Impedance: 67 Ohm
Implantable Lead Implant Date: 20050113
Implantable Lead Location: 753860
Implantable Lead Model: 6949
Implantable Pulse Generator Implant Date: 20190328
Lead Channel Impedance Value: 399 Ohm
Lead Channel Impedance Value: 551 Ohm
Lead Channel Pacing Threshold Amplitude: 0.875 V
Lead Channel Pacing Threshold Pulse Width: 0.4 ms
Lead Channel Sensing Intrinsic Amplitude: 10.625 mV
Lead Channel Sensing Intrinsic Amplitude: 10.625 mV
Lead Channel Setting Pacing Amplitude: 2.5 V
Lead Channel Setting Pacing Pulse Width: 0.4 ms
Lead Channel Setting Sensing Sensitivity: 0.3 mV

## 2019-10-15 ENCOUNTER — Other Ambulatory Visit: Payer: Self-pay | Admitting: Internal Medicine

## 2019-10-15 DIAGNOSIS — I5032 Chronic diastolic (congestive) heart failure: Secondary | ICD-10-CM

## 2019-10-16 NOTE — Progress Notes (Signed)
Remote ICD transmission.   

## 2019-10-31 ENCOUNTER — Telehealth: Payer: Medicare HMO

## 2019-10-31 NOTE — Chronic Care Management (AMB) (Deleted)
Chronic Care Management Pharmacy  Name: Craig Lindsey  MRN: [id number] DOB: 1960-12-01  Chief Complaint/ HPI  Craig Lindsey,  59 y.o., male presents for their Follow-Up CCM visit with the clinical pharmacist via telephone.  PCP : Venia Carbon, MD  Their chronic conditions include: hypertension, hyperlipidemia, diabetes, neuropathy, atrial fibrillation, heart failure, sleep apnea, osteoarthritis, chronic renal disease  Patient concerns: denies medication concerns or changes in past 6 months  Office Visits:  09/25/19: [name] - Rybelsus 3 mg daily ordered. Stop glipizide if low sugar.   09/17/19: [facility] - Video visit for [employer]. Too late for antibody. Symptoms resolved.   3/8/21[name] - exercise, PSA, cont current meds  09/12/19: [name] - DM improving on low dose glipizide, check A1c  Consult Visit:   10/01/19: Cardiology - no changes to medications.   No Known Allergies  Medications: Outpatient Encounter Medications as of 10/31/2019  Medication Sig  . ACCU-CHEK FASTCLIX LANCETS MISC Use to check blood sugar three times a day.  Dx: E11.40  . ACCU-CHEK SMARTVIEW test strip USE AS INSTRUCTED TO TEST BLOOD SUGAR THREE TIMES DAILY  . amiodarone (PACERONE) 200 MG tablet TAKE 1 TABLET (200 MG TOTAL) BY MOUTH DAILY.  Marland Kitchen atorvastatin (LIPITOR) 10 MG tablet TAKE 1 TABLET (10 MG TOTAL) BY MOUTH DAILY AT 6 PM.  . Blood Pressure Monitor DEVI   . calcium carbonate (TUMS - DOSED IN MG ELEMENTAL CALCIUM) 500 MG chewable tablet Chew 1 tablet by mouth 2 (two) times daily.   Marland Kitchen DILT-XR 240 MG 24 hr capsule TAKE 1 CAPSULE (240 MG TOTAL) BY MOUTH EVERY MORNING.  . furosemide (LASIX) 40 MG tablet TAKE 1 TABLET EVERY DAY  . glipiZIDE (GLUCOTROL XL) 2.5 MG 24 hr tablet TAKE 1 TABLET (2.5 MG TOTAL) BY MOUTH DAILY WITH BREAKFAST.  Marland Kitchen lisinopril (ZESTRIL) 40 MG tablet TAKE 1 TABLET (40 MG TOTAL) BY MOUTH DAILY.  . metoprolol succinate (TOPROL-XL) 100 MG 24 hr tablet TAKE 2 TABLETS (200 MG TOTAL)  BY MOUTH DAILY.  . Multiple Vitamins-Minerals (BARIATRIC MULTIVITAMINS/IRON PO) Take by mouth.  . rivaroxaban (XARELTO) 20 MG TABS tablet TAKE 1 TABLET EVERY DAY WITH SUPPER  . Semaglutide 3 MG TABS Take 3 mg by mouth daily.   No facility-administered encounter medications on file as of 10/31/2019.   Current Diagnosis/Assessment: Goals    . Pharmacy Care Plan     CARE PLAN ENTRY  Current Barriers:  . Chronic Disease Management support, education, and care coordination needs related to hypertension, heart failure  Pharmacist Clinical Goal(s):  Marland Kitchen Over the next 6 months, patient will work with PharmD and primary care provider to address the following goals:  . Hypertension: Maintain blood pressure within goal of less than 140/90 mmHg. Continue current medications as prescribed. Call if you notice any blood pressure elevations. . Diabetes: Maintain A1c less than 7% and fasting blood glucose goal of 80-130 mg/dL. Continue current medications as prescribed and checking blood glucose once daily.  Marland Kitchen Heart failure: Prevent exacerbation or fluid overload. Check weight daily in the morning; If you gain more than 3 pounds overnight, please call. . Atrial fibrillation: Compare cost of Xarelto for patient with current pharmacy. . Vaccinations: Recommend 2-dose series of Shingrix vaccine.  Interventions: . Comprehensive medication review performed . Discussed recommended vaccinations and goals for blood pressure and diabetes.  Patient Self Care Activities:  . Self administers medications as prescribed . Self-monitors blood pressure and blood glucose  Initial goal documentation  Hypertension   CMP Latest Ref Rng & Units 03/24/2019 09/12/2018 10/30/2017  Glucose 70 - 99 mg/dL 142(H) 117(H) 161(H)  BUN 6 - 23 mg/dL 25(H) 28(H) 27(H)  Creatinine 0.40 - 1.50 mg/dL 1.52(H) 1.78(H) 1.36  Sodium 135 - 145 mEq/L 138 141 139  Potassium 3.5 - 5.1 mEq/L 4.5 4.4 4.3  Chloride 96 - 112 mEq/L 103 102  105  CO2 19 - 32 mEq/L 28 28 30   Calcium 8.4 - 10.5 mg/dL 9.2 9.6 9.4  Total Protein 6.0 - 8.3 g/dL 7.3 7.1 7.2  Total Bilirubin 0.2 - 1.2 mg/dL 0.6 0.7 0.6  Alkaline Phos 39 - 117 U/L 70 68 68  AST 0 - 37 U/L 21 15 16   ALT 0 - 53 U/L 27 21 26    Office blood pressures are: BP Readings from Last 3 Encounters:  10/01/19 (!) 164/76  09/25/19 120/64  09/17/19 125/63   BP goal < 140/90 mmHg Patient has failed these meds in the past: none Patient checks BP at home when feeling symptomatic Patient home BP readings are ranging: reports SBP 135 mmHg  Patient is currently controlled on the following medications:   Furosemide 40 mg - 1 tablet daily  Lisinopril 40 mg - 1 tablet daily  Metoprolol succinate 100 mg - 2 tablets daily  We discussed: blood pressure goals  Plan: Continue current medications  Hyperlipidemia   Lipid Panel     Component Value Date/Time   CHOL 123 09/12/2018 1536   TRIG 196.0 (H) 09/12/2018 1536   HDL 29.60 (L) 09/12/2018 1536   CHOLHDL 4 09/12/2018 1536   VLDL 39.2 09/12/2018 1536   LDLCALC 55 09/12/2018 1536   LDLDIRECT 78.3 03/10/2013 1229    LDL goal < 100 (denies CV history) Patient has failed these meds in past: none Patient is currently controlled on the following medications:   Atorvastatin 10 mg - 1 tablet daily (PM)  We discussed: denies concerns  Plan: Continue current medications  AFIB   Followed by cardiology  Symptoms: denies Patient is currently rate/rhythm controlled.  Patient has failed these meds in past: none Patient is currently controlled on the following medications:   Amiodarone 200 mg - 1 tablet daily  Diltiazem 240 mg XR - 1 capsule daily   Xarelto 20 mg - 1 tablet with supper  We discussed: confirmed adherence  Plan: Continue current medications  Diabetes   Recent Relevant Labs: Lab Results  Component Value Date/Time   HGBA1C 7.2 (A) 09/25/2019 02:26 PM   HGBA1C 6.9 (H) 03/24/2019 04:29 PM   HGBA1C  7.1 (H) 09/12/2018 03:36 PM   MICROALBUR 1.3 02/19/2014 02:59 PM   MICROALBUR 25.1 (H) 09/13/2010 12:46 PM    Checking BG: Weekly Recent FBG Readings: 130s  Patient has failed these meds in past: insulin - prior to bariatric surgery; metformin (d/c due to renal impariment), Januvia (unknown) Patient is currently controlled on the following medications:   Glipizide XL 2.5 mg - 1 tablet daily with breakfast  Last diabetic eye exam:  Lab Results  Component Value Date/Time   HMDIABEYEEXA No Retinopathy 02/27/2018 12:00 AM    Last diabetic foot exam:  Lab Results  Component Value Date/Time   HMDIABFOOTEX done 03/24/2019 12:00 AM    We discussed: fasting blood glucose goals/diet and exercise  Plan: Continue current medications   Heart Failure   Type: Diastolic Last ejection fraction: 2019 55-60% NYHA Class: I (no actitivty limitation) AHA HF Stage: B (Heart disease present - no symptoms present)  Patient has failed these meds in past:  Patient is currently controlled on the following medications:   Furosemide 40 mg - 1 tablet daily  Lisinopril 40 mg - 1 tablet daily  Metoprolol succinate 100 mg - 2 tablets daily  We discussed weighing daily; if you gain more than 3 pounds in one day or 5 pounds in one week call your doctor; denies shortness of breath or swelling  Plan: Continue current medications   Chronic Kidney Disease    GFR: 47 ml/min Scr: 1.52  Adj BW: 117 kg CrCl: 86 ml/min   Patient is currently on the following medications: No pharmacotherapy  Plan: Continue current medications; No dose adjustments recommended.  Vaccines   Reviewed and discussed patient's vaccination history.    Immunization History  Administered Date(s) Administered  . Influenza Whole 10/16/2007, 10/14/2009  . Influenza, Seasonal, Injecte, Preservative Fre 02/16/2012  . Influenza,inj,Quad PF,6+ Mos 11/26/2012, 11/11/2013, 02/04/2015, 02/23/2016, 01/19/2017, 10/30/2017, 09/12/2018,  09/25/2019  . PFIZER SARS-COV-2 Vaccination 03/28/2019, 04/22/2019  . Pneumococcal Conjugate-13 02/23/2016  . Pneumococcal Polysaccharide-23 10/14/2009  . Tdap 05/03/2011   Plan: Recommended patient receive Shingrix.  SDOH:   Emergency planning/management officer Strain: Low Risk   . Difficulty of Paying Living Expenses: Not hard at all   Medication Management  OTCs: multivitamin, Tums - calcium daily, Bariatric multivitamin chewable - twice a day  Pharmacy/Benefits: [facility] - mail order 90 DS, synchronized ($0 copays, Xarelto $200-300 90DS)  Adherence: no concerns:   Affordability: no concerns   CCM Follow Up: 10/31/19 at 11 AM (telephone)  Sherre Poot, PharmD, [facility] Clinical Pharmacist [facility] [phone] (office) [phone] (mobile)

## 2019-11-07 ENCOUNTER — Telehealth: Payer: Medicare HMO

## 2019-11-07 NOTE — Chronic Care Management (AMB) (Deleted)
Chronic Care Management Pharmacy  Name: DESHAY BLUMENFELD  MRN: [id number] DOB: August 09, 1960  Chief Complaint/ HPI  Craig Lindsey,  59 y.o., male presents for their Follow-Up CCM visit with the clinical pharmacist via telephone.  PCP : Venia Carbon, MD  Their chronic conditions include: hypertension, hyperlipidemia, diabetes, neuropathy, atrial fibrillation, heart failure, sleep apnea, osteoarthritis, chronic renal disease  Patient concerns: denies medication concerns or changes in past 6 months  Office Visits:  09/25/19: [name] - Rybelsus 3 mg daily ordered. Stop glipizide if low sugar.   09/17/19: [facility] - Video visit for [employer]. Too late for antibody. Symptoms resolved.   3/8/21[name] - exercise, PSA, cont current meds  09/12/19: [name] - DM improving on low dose glipizide, check A1c  Consult Visit:   10/01/19: Cardiology - no changes to medications.   No Known Allergies  Medications: Outpatient Encounter Medications as of 11/07/2019  Medication Sig  . ACCU-CHEK FASTCLIX LANCETS MISC Use to check blood sugar three times a day.  Dx: E11.40  . ACCU-CHEK SMARTVIEW test strip USE AS INSTRUCTED TO TEST BLOOD SUGAR THREE TIMES DAILY  . amiodarone (PACERONE) 200 MG tablet TAKE 1 TABLET (200 MG TOTAL) BY MOUTH DAILY.  Marland Kitchen atorvastatin (LIPITOR) 10 MG tablet TAKE 1 TABLET (10 MG TOTAL) BY MOUTH DAILY AT 6 PM.  . Blood Pressure Monitor DEVI   . calcium carbonate (TUMS - DOSED IN MG ELEMENTAL CALCIUM) 500 MG chewable tablet Chew 1 tablet by mouth 2 (two) times daily.   Marland Kitchen DILT-XR 240 MG 24 hr capsule TAKE 1 CAPSULE (240 MG TOTAL) BY MOUTH EVERY MORNING.  . furosemide (LASIX) 40 MG tablet TAKE 1 TABLET EVERY DAY  . glipiZIDE (GLUCOTROL XL) 2.5 MG 24 hr tablet TAKE 1 TABLET (2.5 MG TOTAL) BY MOUTH DAILY WITH BREAKFAST.  Marland Kitchen lisinopril (ZESTRIL) 40 MG tablet TAKE 1 TABLET (40 MG TOTAL) BY MOUTH DAILY.  . metoprolol succinate (TOPROL-XL) 100 MG 24 hr tablet TAKE 2 TABLETS (200 MG TOTAL)  BY MOUTH DAILY.  . Multiple Vitamins-Minerals (BARIATRIC MULTIVITAMINS/IRON PO) Take by mouth.  . rivaroxaban (XARELTO) 20 MG TABS tablet TAKE 1 TABLET EVERY DAY WITH SUPPER  . Semaglutide 3 MG TABS Take 3 mg by mouth daily.   No facility-administered encounter medications on file as of 11/07/2019.   Current Diagnosis/Assessment: Goals    . Pharmacy Care Plan     CARE PLAN ENTRY  Current Barriers:  . Chronic Disease Management support, education, and care coordination needs related to hypertension, heart failure  Pharmacist Clinical Goal(s):  Marland Kitchen Over the next 6 months, patient will work with PharmD and primary care provider to address the following goals:  . Hypertension: Maintain blood pressure within goal of less than 140/90 mmHg. Continue current medications as prescribed. Call if you notice any blood pressure elevations. . Diabetes: Maintain A1c less than 7% and fasting blood glucose goal of 80-130 mg/dL. Continue current medications as prescribed and checking blood glucose once daily.  Marland Kitchen Heart failure: Prevent exacerbation or fluid overload. Check weight daily in the morning; If you gain more than 3 pounds overnight, please call. . Atrial fibrillation: Compare cost of Xarelto for patient with current pharmacy. . Vaccinations: Recommend 2-dose series of Shingrix vaccine.  Interventions: . Comprehensive medication review performed . Discussed recommended vaccinations and goals for blood pressure and diabetes.  Patient Self Care Activities:  . Self administers medications as prescribed . Self-monitors blood pressure and blood glucose  Initial goal documentation  Hypertension   CMP Latest Ref Rng & Units 03/24/2019 09/12/2018 10/30/2017  Glucose 70 - 99 mg/dL 142(H) 117(H) 161(H)  BUN 6 - 23 mg/dL 25(H) 28(H) 27(H)  Creatinine 0.40 - 1.50 mg/dL 1.52(H) 1.78(H) 1.36  Sodium 135 - 145 mEq/L 138 141 139  Potassium 3.5 - 5.1 mEq/L 4.5 4.4 4.3  Chloride 96 - 112 mEq/L 103 102  105  CO2 19 - 32 mEq/L 28 28 30   Calcium 8.4 - 10.5 mg/dL 9.2 9.6 9.4  Total Protein 6.0 - 8.3 g/dL 7.3 7.1 7.2  Total Bilirubin 0.2 - 1.2 mg/dL 0.6 0.7 0.6  Alkaline Phos 39 - 117 U/L 70 68 68  AST 0 - 37 U/L 21 15 16   ALT 0 - 53 U/L 27 21 26    Office blood pressures are: BP Readings from Last 3 Encounters:  10/01/19 (!) 164/76  09/25/19 120/64  09/17/19 125/63   BP goal < 140/90 mmHg Patient has failed these meds in the past: none Patient checks BP at home when feeling symptomatic Patient home BP readings are ranging: reports SBP 135 mmHg  Patient is currently controlled on the following medications:   Furosemide 40 mg - 1 tablet daily  Lisinopril 40 mg - 1 tablet daily  Metoprolol succinate 100 mg - 2 tablets daily  We discussed: blood pressure goals  Plan: Continue current medications  Hyperlipidemia   Lipid Panel     Component Value Date/Time   CHOL 123 09/12/2018 1536   TRIG 196.0 (H) 09/12/2018 1536   HDL 29.60 (L) 09/12/2018 1536   CHOLHDL 4 09/12/2018 1536   VLDL 39.2 09/12/2018 1536   LDLCALC 55 09/12/2018 1536   LDLDIRECT 78.3 03/10/2013 1229    LDL goal < 100 (denies CV history) Patient has failed these meds in past: none Patient is currently controlled on the following medications:   Atorvastatin 10 mg - 1 tablet daily (PM)  We discussed: denies concerns  Plan: Continue current medications  AFIB   Followed by cardiology  Symptoms: denies Patient is currently rate/rhythm controlled.  Patient has failed these meds in past: none Patient is currently controlled on the following medications:   Amiodarone 200 mg - 1 tablet daily  Diltiazem 240 mg XR - 1 capsule daily   Xarelto 20 mg - 1 tablet with supper  We discussed: confirmed adherence  Plan: Continue current medications  Diabetes   Recent Relevant Labs: Lab Results  Component Value Date/Time   HGBA1C 7.2 (A) 09/25/2019 02:26 PM   HGBA1C 6.9 (H) 03/24/2019 04:29 PM   HGBA1C  7.1 (H) 09/12/2018 03:36 PM   MICROALBUR 1.3 02/19/2014 02:59 PM   MICROALBUR 25.1 (H) 09/13/2010 12:46 PM    Checking BG: Weekly Recent FBG Readings: 130s  Patient has failed these meds in past: insulin - prior to bariatric surgery; metformin (d/c due to renal impariment), Januvia (unknown) Patient is currently controlled on the following medications:   Glipizide XL 2.5 mg - 1 tablet daily with breakfast  Last diabetic eye exam:  Lab Results  Component Value Date/Time   HMDIABEYEEXA No Retinopathy 02/27/2018 12:00 AM    Last diabetic foot exam:  Lab Results  Component Value Date/Time   HMDIABFOOTEX done 03/24/2019 12:00 AM    We discussed: fasting blood glucose goals/diet and exercise  Plan: Continue current medications   Heart Failure   Type: Diastolic Last ejection fraction: 2019 55-60% NYHA Class: I (no actitivty limitation) AHA HF Stage: B (Heart disease present - no symptoms present)  Patient has failed these meds in past:  Patient is currently controlled on the following medications:   Furosemide 40 mg - 1 tablet daily  Lisinopril 40 mg - 1 tablet daily  Metoprolol succinate 100 mg - 2 tablets daily  We discussed weighing daily; if you gain more than 3 pounds in one day or 5 pounds in one week call your doctor; denies shortness of breath or swelling  Plan: Continue current medications   Chronic Kidney Disease    GFR: 47 ml/min Scr: 1.52  Adj BW: 117 kg CrCl: 86 ml/min   Patient is currently on the following medications: No pharmacotherapy  Plan: Continue current medications; No dose adjustments recommended.  Vaccines   Reviewed and discussed patient's vaccination history.    Immunization History  Administered Date(s) Administered  . Influenza Whole 10/16/2007, 10/14/2009  . Influenza, Seasonal, Injecte, Preservative Fre 02/16/2012  . Influenza,inj,Quad PF,6+ Mos 11/26/2012, 11/11/2013, 02/04/2015, 02/23/2016, 01/19/2017, 10/30/2017, 09/12/2018,  09/25/2019  . PFIZER SARS-COV-2 Vaccination 03/28/2019, 04/22/2019  . Pneumococcal Conjugate-13 02/23/2016  . Pneumococcal Polysaccharide-23 10/14/2009  . Tdap 05/03/2011   Plan: Recommended patient receive Shingrix.  SDOH:   Emergency planning/management officer Strain: Low Risk   . Difficulty of Paying Living Expenses: Not hard at all   Medication Management  OTCs: multivitamin, Tums - calcium daily, Bariatric multivitamin chewable - twice a day  Pharmacy/Benefits: [facility] - mail order 90 DS, synchronized ($0 copays, Xarelto $200-300 90DS)  Adherence: no concerns:   Affordability: no concerns   CCM Follow Up: 10/31/19 at 11 AM (telephone)  Sherre Poot, PharmD, [facility] Clinical Pharmacist [facility] [phone] (office) [phone] (mobile)

## 2019-11-28 DIAGNOSIS — G4733 Obstructive sleep apnea (adult) (pediatric): Secondary | ICD-10-CM | POA: Diagnosis not present

## 2019-12-14 ENCOUNTER — Other Ambulatory Visit: Payer: Self-pay | Admitting: Internal Medicine

## 2020-01-12 ENCOUNTER — Ambulatory Visit (INDEPENDENT_AMBULATORY_CARE_PROVIDER_SITE_OTHER): Payer: Medicare HMO

## 2020-01-12 DIAGNOSIS — I472 Ventricular tachycardia, unspecified: Secondary | ICD-10-CM

## 2020-01-13 LAB — CUP PACEART REMOTE DEVICE CHECK
Battery Remaining Longevity: 111 mo
Battery Voltage: 3.01 V
Brady Statistic RV Percent Paced: 10.78 %
Date Time Interrogation Session: 20211227022824
HighPow Impedance: 54 Ohm
HighPow Impedance: 70 Ohm
Implantable Lead Implant Date: 20050113
Implantable Lead Location: 753860
Implantable Lead Model: 6949
Implantable Pulse Generator Implant Date: 20190328
Lead Channel Impedance Value: 456 Ohm
Lead Channel Impedance Value: 608 Ohm
Lead Channel Pacing Threshold Amplitude: 1 V
Lead Channel Pacing Threshold Pulse Width: 0.4 ms
Lead Channel Sensing Intrinsic Amplitude: 11.125 mV
Lead Channel Sensing Intrinsic Amplitude: 11.125 mV
Lead Channel Setting Pacing Amplitude: 2.5 V
Lead Channel Setting Pacing Pulse Width: 0.4 ms
Lead Channel Setting Sensing Sensitivity: 0.3 mV

## 2020-01-22 NOTE — Progress Notes (Signed)
Remote ICD transmission.   

## 2020-01-25 ENCOUNTER — Other Ambulatory Visit: Payer: Self-pay | Admitting: Internal Medicine

## 2020-02-03 ENCOUNTER — Telehealth: Payer: Self-pay

## 2020-02-03 NOTE — Chronic Care Management (AMB) (Signed)
    Chronic Care Management Pharmacy Assistant   Name: Craig Lindsey  MRN: [id number] DOB: [birth year]  Reason for Encounter: Disease State- General  Patient Questions:  1.  Have you seen any other providers since your last visit? Yes 10/13/19- Cristopher Peru, MD- Cardiology  10/01/19- Chanetta Marshall, NP- Cardiology 09/25/19- Dr. Viviana Simpler- PCP-  09/17/19- Dr. Loura Pardon- Family medicine 07/14/19- Dr. Cristopher Peru- Cardiology 06/26/19- Dr. Antonieta Loveless- Optometry   2.  Any changes in your medicines or health? Yes 09/25/19 Dr. [name] started patient on Semaglutide 3 MG TABS   PCP : Venia Carbon, MD  Allergies:  No Known Allergies  Medications: Outpatient Encounter Medications as of 02/03/2020  Medication Sig  . ACCU-CHEK FASTCLIX LANCETS MISC Use to check blood sugar three times a day.  Dx: E11.40  . ACCU-CHEK SMARTVIEW test strip USE AS INSTRUCTED TO TEST BLOOD SUGAR THREE TIMES DAILY  . amiodarone (PACERONE) 200 MG tablet TAKE 1 TABLET (200 MG TOTAL) BY MOUTH DAILY.  Marland Kitchen atorvastatin (LIPITOR) 10 MG tablet TAKE 1 TABLET (10 MG TOTAL) BY MOUTH DAILY AT 6 PM.  . Blood Pressure Monitor DEVI   . calcium carbonate (TUMS - DOSED IN MG ELEMENTAL CALCIUM) 500 MG chewable tablet Chew 1 tablet by mouth 2 (two) times daily.   Marland Kitchen DILT-XR 240 MG 24 hr capsule TAKE 1 CAPSULE (240 MG TOTAL) BY MOUTH EVERY MORNING.  . furosemide (LASIX) 40 MG tablet TAKE 1 TABLET EVERY DAY  . glipiZIDE (GLUCOTROL XL) 2.5 MG 24 hr tablet TAKE 1 TABLET (2.5 MG TOTAL) BY MOUTH DAILY WITH BREAKFAST.  Marland Kitchen lisinopril (ZESTRIL) 40 MG tablet TAKE 1 TABLET (40 MG TOTAL) BY MOUTH DAILY.  . metoprolol succinate (TOPROL-XL) 100 MG 24 hr tablet TAKE 2 TABLETS (200 MG TOTAL) BY MOUTH DAILY.  . Multiple Vitamins-Minerals (BARIATRIC MULTIVITAMINS/IRON PO) Take by mouth.  . rivaroxaban (XARELTO) 20 MG TABS tablet TAKE 1 TABLET EVERY DAY WITH SUPPER  . Semaglutide 3 MG TABS Take 3 mg by mouth daily.   No facility-administered  encounter medications on file as of 02/03/2020.    Current Diagnosis: Patient Active Problem List   Diagnosis Date Noted  . COVID-19 09/17/2019  . Thrombocytopenia ([place]) 03/11/2018  . Advance directive discussed with patient 03/11/2018  . Morbid obesity ([place]) 04/30/2017  . Sleep apnea   . Chronic diastolic heart failure ([place]) 02/03/2014  . Diabetic nephropathy associated with type 2 diabetes mellitus ([place]) 11/11/2013  . Chronic renal disease, stage III ([place]) 11/11/2013  . Personal history of colonic polyps 01/13/2013  . Ventral hernia 03/29/2011  . Routine general medical examination at a health care facility 09/13/2010  . Type 2 diabetes, controlled, with neuropathy ([place]) 03/16/2010  . OSTEOARTHRITIS, KNEES, BILATERAL 03/16/2010  . Automatic implantable cardioverter-defibrillator in situ 07/16/2009  . ADENOCARCINOMA, COLON, HX OF, T3, N1, Sigmoid colectomy 02/13/2007. 05/31/2009  . Personal history of colon cancer, stage III 01/17/2007  . Hyperlipidemia 06/19/2006  . Essential hypertension 06/19/2006  . HYPERSOMNIA, ASSOCIATED WITH SLEEP APNEA 06/19/2006  . Atrial fibrillation ([place]) 05/23/2006  . Ventricular tachycardia ([place]) 12/17/2002   Attempted contact with patient on 3 separate occasions to discuss general health adherence, blood pressure and blood glucose readings. Unable to reach patient. Will attempt contact next month to discuss general adherence.  Follow-Up:  Pharmacist Review   Debbora Dus, CPP notified  Margaretmary Dys, [place] Pharmacy Assistant [phone]

## 2020-03-05 DIAGNOSIS — G4733 Obstructive sleep apnea (adult) (pediatric): Secondary | ICD-10-CM | POA: Diagnosis not present

## 2020-03-08 ENCOUNTER — Telehealth: Payer: Self-pay

## 2020-03-08 NOTE — Chronic Care Management (AMB) (Addendum)
   Chronic Care Management Pharmacy Assistant   Name: Craig Lindsey  MRN: [id number] DOB: 06/07/1960  Reason for Encounter: Disease State- General     PCP : Craig Carbon, MD  Allergies:  No Known Allergies  Medications: Outpatient Encounter Medications as of 03/08/2020  Medication Sig   ACCU-CHEK FASTCLIX LANCETS MISC Use to check blood sugar three times a day.  Dx: E11.40   ACCU-CHEK SMARTVIEW test strip USE AS INSTRUCTED TO TEST BLOOD SUGAR THREE TIMES DAILY   amiodarone (PACERONE) 200 MG tablet TAKE 1 TABLET (200 MG TOTAL) BY MOUTH DAILY.   atorvastatin (LIPITOR) 10 MG tablet TAKE 1 TABLET (10 MG TOTAL) BY MOUTH DAILY AT 6 PM.   Blood Pressure Monitor DEVI    calcium carbonate (TUMS - DOSED IN MG ELEMENTAL CALCIUM) 500 MG chewable tablet Chew 1 tablet by mouth 2 (two) times daily.    DILT-XR 240 MG 24 hr capsule TAKE 1 CAPSULE (240 MG TOTAL) BY MOUTH EVERY MORNING.   furosemide (LASIX) 40 MG tablet TAKE 1 TABLET EVERY DAY   glipiZIDE (GLUCOTROL XL) 2.5 MG 24 hr tablet TAKE 1 TABLET (2.5 MG TOTAL) BY MOUTH DAILY WITH BREAKFAST.   lisinopril (ZESTRIL) 40 MG tablet TAKE 1 TABLET (40 MG TOTAL) BY MOUTH DAILY.   metoprolol succinate (TOPROL-XL) 100 MG 24 hr tablet TAKE 2 TABLETS (200 MG TOTAL) BY MOUTH DAILY.   Multiple Vitamins-Minerals (BARIATRIC MULTIVITAMINS/IRON PO) Take by mouth.   rivaroxaban (XARELTO) 20 MG TABS tablet TAKE 1 TABLET EVERY DAY WITH SUPPER   Semaglutide 3 MG TABS Take 3 mg by mouth daily.   No facility-administered encounter medications on file as of 03/08/2020.    Current Diagnosis: Patient Active Problem List   Diagnosis Date Noted   COVID-19 09/17/2019   Thrombocytopenia ([place]) 03/11/2018   Advance directive discussed with patient 03/11/2018   Morbid obesity ([place]) 04/30/2017   Sleep apnea    Chronic diastolic heart failure ([place]) 02/03/2014   Diabetic nephropathy associated with type 2 diabetes mellitus ([place]) 11/11/2013   Chronic renal disease,  stage III ([place]) 11/11/2013   Personal history of colonic polyps 01/13/2013   Ventral hernia 03/29/2011   Routine general medical examination at a health care facility 09/13/2010   Type 2 diabetes, controlled, with neuropathy ([place]) 03/16/2010   OSTEOARTHRITIS, KNEES, BILATERAL 03/16/2010   Automatic implantable cardioverter-defibrillator in situ 07/16/2009   ADENOCARCINOMA, COLON, HX OF, T3, N1, Sigmoid colectomy 02/13/2007. 05/31/2009   Personal history of colon cancer, stage III 01/17/2007   Hyperlipidemia 06/19/2006   Essential hypertension 06/19/2006   HYPERSOMNIA, ASSOCIATED WITH SLEEP APNEA 06/19/2006   Atrial fibrillation ([place]) 05/23/2006   Ventricular tachycardia ([place]) 12/17/2002   Multiple attempts made to contact patient for general disease state call and to schedule appointment with Debbora Dus. Unsuccessful outreach to patient.   Follow-Up:  Pharmacist Review  Debbora Dus, CPP notified  Margaretmary Dys, [place] Assistant [phone]  I have reviewed the care management and care coordination activities outlined in this encounter and I am certifying that I agree with the content of this note. No further action required.  Debbora Dus, PharmD Clinical Pharmacist [facility] at [facility] [phone]

## 2020-04-01 ENCOUNTER — Encounter: Payer: Medicare HMO | Admitting: Internal Medicine

## 2020-04-08 ENCOUNTER — Other Ambulatory Visit: Payer: Self-pay | Admitting: Internal Medicine

## 2020-04-09 ENCOUNTER — Other Ambulatory Visit: Payer: Self-pay | Admitting: Internal Medicine

## 2020-04-12 ENCOUNTER — Ambulatory Visit (INDEPENDENT_AMBULATORY_CARE_PROVIDER_SITE_OTHER): Payer: Medicare HMO

## 2020-04-12 DIAGNOSIS — I4901 Ventricular fibrillation: Secondary | ICD-10-CM

## 2020-04-12 LAB — CUP PACEART REMOTE DEVICE CHECK
Battery Remaining Longevity: 107 mo
Battery Voltage: 3.01 V
Brady Statistic RV Percent Paced: 18.42 %
Date Time Interrogation Session: 20220328033625
HighPow Impedance: 50 Ohm
HighPow Impedance: 65 Ohm
Implantable Lead Implant Date: 20050113
Implantable Lead Location: 753860
Implantable Lead Model: 6949
Implantable Pulse Generator Implant Date: 20190328
Lead Channel Impedance Value: 399 Ohm
Lead Channel Impedance Value: 551 Ohm
Lead Channel Pacing Threshold Amplitude: 0.875 V
Lead Channel Pacing Threshold Pulse Width: 0.4 ms
Lead Channel Sensing Intrinsic Amplitude: 10.625 mV
Lead Channel Sensing Intrinsic Amplitude: 10.625 mV
Lead Channel Setting Pacing Amplitude: 2.5 V
Lead Channel Setting Pacing Pulse Width: 0.4 ms
Lead Channel Setting Sensing Sensitivity: 0.3 mV

## 2020-04-23 NOTE — Progress Notes (Signed)
Remote ICD transmission.   

## 2020-05-17 ENCOUNTER — Other Ambulatory Visit: Payer: Self-pay

## 2020-05-17 ENCOUNTER — Encounter: Payer: Self-pay | Admitting: Internal Medicine

## 2020-05-17 ENCOUNTER — Ambulatory Visit (INDEPENDENT_AMBULATORY_CARE_PROVIDER_SITE_OTHER): Payer: Medicare HMO | Admitting: Internal Medicine

## 2020-05-17 VITALS — BP 118/66 | HR 45 | Temp 97.5°F | Ht 73.0 in | Wt 358.0 lb

## 2020-05-17 DIAGNOSIS — Z7189 Other specified counseling: Secondary | ICD-10-CM | POA: Diagnosis not present

## 2020-05-17 DIAGNOSIS — D696 Thrombocytopenia, unspecified: Secondary | ICD-10-CM

## 2020-05-17 DIAGNOSIS — I480 Paroxysmal atrial fibrillation: Secondary | ICD-10-CM

## 2020-05-17 DIAGNOSIS — I5032 Chronic diastolic (congestive) heart failure: Secondary | ICD-10-CM | POA: Diagnosis not present

## 2020-05-17 DIAGNOSIS — Z0000 Encounter for general adult medical examination without abnormal findings: Secondary | ICD-10-CM

## 2020-05-17 DIAGNOSIS — E1140 Type 2 diabetes mellitus with diabetic neuropathy, unspecified: Secondary | ICD-10-CM | POA: Diagnosis not present

## 2020-05-17 DIAGNOSIS — N1831 Chronic kidney disease, stage 3a: Secondary | ICD-10-CM

## 2020-05-17 LAB — HM DIABETES FOOT EXAM

## 2020-05-17 MED ORDER — SEMAGLUTIDE(0.25 OR 0.5MG/DOS) 2 MG/1.5ML ~~LOC~~ SOPN: 0.2500 mg | PEN_INJECTOR | SUBCUTANEOUS | 5 refills | Status: DC

## 2020-05-17 NOTE — Assessment & Plan Note (Signed)
Paced I think it is paroxysmal On xarelto and amiodarone

## 2020-05-17 NOTE — Progress Notes (Addendum)
Subjective:    Patient ID: Craig Lindsey, male    DOB: 1960/11/26, 60 y.o.   MRN: [id number]  HPI Here for Medicare wellness visit and follow up of chronic health conditions This visit occurred during the SARS-CoV-2 public health emergency.  Safety protocols were in place, including screening questions prior to the visit, additional usage of staff PPE, and extensive cleaning of exam room while observing appropriate contact time as indicated for disinfecting solutions.   Reviewed form and advanced directives Reviewed other doctors No alcohol or tobacco Tries to walk a little Fell once in the dark in daughter's house---slipped (no injury other than mild knee pain) No depression or anhedonia Vision is okay--due for exam next month Hearing is okay Independent with instrumental ADLs No sig memory issues  Considering the [place] diet Has lost 20# since my last visit Has cut out sodas (though was only diet) More careful with eating Discussed semaglutide again  Checks sugars intermittently Last fasting was 99 Same foot numbness but no pain Mild balance issues with this  No chest pain No palpitations No dizziness or syncope No edema No SOB---exercise tolerance is stable (walks very short distances)  Last GFR 47 Continues on lisinopril  Knee pain from arthritis seems better with the weight loss Tylenol does help  Current Outpatient Medications on File Prior to Visit  Medication Sig Dispense Refill  . ACCU-CHEK FASTCLIX LANCETS MISC Use to check blood sugar three times a day.  Dx: E11.40 306 each 3  . ACCU-CHEK SMARTVIEW test strip USE AS INSTRUCTED TO TEST BLOOD SUGAR THREE TIMES DAILY 300 each 12  . amiodarone (PACERONE) 200 MG tablet TAKE 1 TABLET (200 MG TOTAL) BY MOUTH DAILY. 90 tablet 3  . atorvastatin (LIPITOR) 10 MG tablet TAKE 1 TABLET (10 MG TOTAL) BY MOUTH DAILY AT 6 PM. 90 tablet 3  . Blood Pressure Monitor DEVI     . calcium carbonate (TUMS - DOSED IN MG  ELEMENTAL CALCIUM) 500 MG chewable tablet Chew 1 tablet by mouth 2 (two) times daily.     Marland Kitchen DILT-XR 240 MG 24 hr capsule TAKE 1 CAPSULE (240 MG TOTAL) BY MOUTH EVERY MORNING. 90 capsule 3  . furosemide (LASIX) 40 MG tablet TAKE 1 TABLET EVERY DAY 90 tablet 3  . glipiZIDE (GLUCOTROL XL) 2.5 MG 24 hr tablet TAKE 1 TABLET DAILY WITH BREAKFAST. 90 tablet 3  . lisinopril (ZESTRIL) 40 MG tablet TAKE 1 TABLET (40 MG TOTAL) BY MOUTH DAILY. 90 tablet 3  . metoprolol succinate (TOPROL-XL) 100 MG 24 hr tablet TAKE 2 TABLETS (200 MG TOTAL) BY MOUTH DAILY. 180 tablet 3  . Multiple Vitamins-Minerals (BARIATRIC MULTIVITAMINS/IRON PO) Take by mouth.    Alveda Reasons 20 MG TABS tablet TAKE 1 TABLET EVERY DAY WITH SUPPER 90 tablet 3   No current facility-administered medications on file prior to visit.    No Known Allergies  Past Medical History:  Diagnosis Date  . Arrhythmia 12/04   1. Ventricular tachycardia; Dr. [name]  2. Atrial fibrillation  . Arthritis    knees  . Chronic kidney disease    Complex L renal cyst, no tx for   . Diabetes mellitus    Type II  . DVT (deep venous thrombosis) ([place]) 7/09   RLE, Tx Arixtra  . Dysrhythmia    v fib and a fib has icd  . Hemorrhoids    hx of  . Hyperlipidemia   . Hypertension   . 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 Obesity   . Personal history of colon cancer, stage III 1/09   Dr. Carlean Purl, Benay Spice, D. Newman  . Sigmoid colon ulcer 5 yrs ago   Sigmoid; Nydia Bouton, MD (colon cancer)  . Sleep apnea    uses cpap setting of 11.2  . Umbilical hernia     Past Surgical History:  Procedure Laterality Date  . CARDIAC DEFIBRILLATOR PLACEMENT     2004  . CHOLECYSTECTOMY    . COLECTOMY  1/09   Sigmoid; Nydia Bouton, MD (colon cancer)  . COLONOSCOPY    . COLONOSCOPY N/A 02/25/2013   Procedure: COLONOSCOPY;  Surgeon: Gatha Mayer, MD;  Location: [facility];  Service: Endoscopy;  Laterality: N/A;  . HERNIA REPAIR  yrs ago   umb hernia  .  ICD GENERATOR CHANGEOUT N/A 04/12/2017   Procedure: [place];  Surgeon: Evans Lance, MD;  Location: [place] [facility];  Service: Cardiovascular;  Laterality: N/A;  . LAPAROSCOPIC GASTRIC SLEEVE RESECTION     Dr. Lucia Gaskins 04-30-17  . LAPAROSCOPIC GASTRIC SLEEVE RESECTION N/A 04/30/2017   Procedure: LAPAROSCOPIC GASTRIC SLEEVE RESECTION WITH UPPER ENDO;  Surgeon: Alphonsa Overall, MD;  Location: [facility];  Service: General;  Laterality: N/A;    Family History  Problem Relation Age of Onset  . Diabetes Father   . Cancer Father        myelofibrosis  . Colon cancer Other   . Diabetes Maternal Grandmother   . Cancer Maternal Aunt        colon  . Hypertension Neg Hx   . Coronary artery disease Neg Hx   . Prostate cancer Neg Hx   . Esophageal cancer Neg Hx   . Rectal cancer Neg Hx   . Stomach cancer Neg Hx     Social History   Socioeconomic History  . Marital status: Married    Spouse name: Not on file  . Number of children: 2  . Years of education: Not on file  . Highest education level: Not on file  Occupational History  . Occupation: Chartered certified accountant    Comment: Disabled  Tobacco Use  . Smoking status: Never Smoker  . Smokeless tobacco: Never Used  Vaping Use  . Vaping Use: Never used  Substance and Sexual Activity  . Alcohol use: No    Alcohol/week: 0.0 standard drinks  . Drug use: No  . Sexual activity: Yes  Other Topics Concern  . Not on file  Social History Narrative   2 step children from wife's 2nd marriage.      Has living will   Wife is health care POA---alternate would be sister, Vickie Epley   Would accept resuscitation attempts   Not sure about tube feedings   Social Determinants of Health   Financial Resource Strain: Not on file  Food Insecurity: Not on file  Transportation Needs: Not on file  Physical Activity: Not on file  Stress: Not on file  Social Connections: Not on file  Intimate Partner Violence: Not on file   Review of  Systems Appetite is okay--just being careful. Wears seat belt Sleeps well---with CPAP nightly Teeth are fine---keeps up with dentist No rash or suspicious skin lesions Ventral hernia is bigger--may need to see surgeon about this No heartburn or dysphagia Bowels move fine--no blood Voids okay--mild dribbling but nothing worrisome. No regular nocturia No abnormal bleeding or bruising     Objective:   Physical Exam Constitutional:  Appearance: He is obese.  HENT:     Mouth/Throat:     Comments: No lesions Eyes:     Conjunctiva/sclera: Conjunctivae normal.     Pupils: Pupils are equal, round, and reactive to light.  Cardiovascular:     Rate and Rhythm: Normal rate and regular rhythm.     Pulses: Normal pulses.     Heart sounds: No murmur heard. No gallop.   Pulmonary:     Effort: Pulmonary effort is normal.     Breath sounds: Normal breath sounds. No wheezing or rales.  Abdominal:     Palpations: Abdomen is soft.     Tenderness: There is no abdominal tenderness.     Comments: Ventral hernia to right and under umbilicus  Musculoskeletal:     Cervical back: Neck supple.     Right lower leg: No edema.     Left lower leg: No edema.  Lymphadenopathy:     Cervical: No cervical adenopathy.  Skin:    Comments: No foot lesions Stasis changes in calves  Neurological:     Mental Status: He is alert.     Comments: President--- "Waymond Cera, Obama" [phone] [name] Recall 3/3  Slight decrease in sensation in plantar feet  Psychiatric:        Mood and Affect: Mood normal.        Behavior: Behavior normal.            Assessment & Plan:

## 2020-05-17 NOTE — Assessment & Plan Note (Signed)
Compensated on lisinopril, metoprolol, furosemide

## 2020-05-17 NOTE — Assessment & Plan Note (Signed)
Lab Results  Component Value Date   HGBA1C 7.2 (A) 09/25/2019   Hopefully still good control on glipizide Will add injectable semaglutide for weight and sugars

## 2020-05-17 NOTE — Assessment & Plan Note (Signed)
I have personally reviewed the Medicare Annual Wellness questionnaire and have noted 1. The patient's medical and social history 2. Their use of alcohol, tobacco or illicit drugs 3. Their current medications and supplements 4. The patient's functional ability including ADL's, fall risks, home safety risks and hearing or visual             impairment. 5. Diet and physical activities 6. Evidence for depression or mood disorders  The patients weight, height, BMI and visual acuity have been recorded in the chart I have made referrals, counseling and provided education to the patient based review of the above and I have provided the pt with a written personalized care plan for preventive services.  I have provided you with a copy of your personalized plan for preventive services. Please take the time to review along with your updated medication list.  Next colon in 2025 Defer PSA to next year Needs COVID booster and flu vaccine in the fall Discussed increasing exercise

## 2020-05-17 NOTE — Assessment & Plan Note (Signed)
See social history 

## 2020-05-17 NOTE — Assessment & Plan Note (Signed)
Will try injectable semaglutide

## 2020-05-17 NOTE — Assessment & Plan Note (Signed)
On lisinopril Will check labs

## 2020-05-18 LAB — RENAL FUNCTION PANEL
Albumin: 4.4 g/dL (ref 3.5–5.2)
BUN: 66 mg/dL — ABNORMAL HIGH (ref 6–23)
CO2: 29 mEq/L (ref 19–32)
Calcium: 9.9 mg/dL (ref 8.4–10.5)
Chloride: 98 mEq/L (ref 96–112)
Creatinine, Ser: 2.25 mg/dL — ABNORMAL HIGH (ref 0.40–1.50)
GFR: 31.02 mL/min — ABNORMAL LOW (ref >60.00)
Glucose, Bld: 121 mg/dL — ABNORMAL HIGH (ref 70–99)
Phosphorus: 3.8 mg/dL (ref 2.3–4.6)
Potassium: 5.4 mEq/L — ABNORMAL HIGH (ref 3.5–5.1)
Sodium: 137 mEq/L (ref 135–145)

## 2020-05-18 LAB — LIPID PANEL
Cholesterol: 103 mg/dL (ref 0–200)
HDL: 27.4 mg/dL — ABNORMAL LOW (ref >39.00)
LDL Cholesterol: 44 mg/dL (ref 0–99)
NonHDL: 75.67
Total CHOL/HDL Ratio: 4
Triglycerides: 156 mg/dL — ABNORMAL HIGH (ref 0.0–149.0)
VLDL: 31.2 mg/dL (ref 0.0–40.0)

## 2020-05-18 LAB — HEPATIC FUNCTION PANEL
ALT: 30 U/L (ref 0–53)
AST: 17 U/L (ref 0–37)
Albumin: 4.4 g/dL (ref 3.5–5.2)
Alkaline Phosphatase: 56 U/L (ref 39–117)
Bilirubin, Direct: 0.2 mg/dL (ref 0.0–0.3)
Total Bilirubin: 0.7 mg/dL (ref 0.2–1.2)
Total Protein: 7.6 g/dL (ref 6.0–8.3)

## 2020-05-18 LAB — CBC
HCT: 50.3 % (ref 39.0–52.0)
Hemoglobin: 16.8 g/dL (ref 13.0–17.0)
MCHC: 33.3 g/dL (ref 30.0–36.0)
MCV: 93.1 fl (ref 78.0–100.0)
Platelets: 147 10*3/uL — ABNORMAL LOW (ref 150.0–400.0)
RBC: 5.4 Mil/uL (ref 4.22–5.81)
RDW: 13.5 % (ref 11.5–15.5)
WBC: 8.2 10*3/uL (ref 4.0–10.5)

## 2020-05-18 LAB — T4, FREE: Free T4: 1.48 ng/dL (ref 0.60–1.60)

## 2020-05-18 LAB — VITAMIN D 25 HYDROXY (VIT D DEFICIENCY, FRACTURES): VITD: 44.56 ng/mL (ref 30.00–100.00)

## 2020-05-18 LAB — HEMOGLOBIN A1C: Hgb A1c MFr Bld: 6.4 % (ref 4.6–6.5)

## 2020-05-18 LAB — PARATHYROID HORMONE, INTACT (NO CA): PTH: 90 pg/mL — ABNORMAL HIGH (ref 16–77)

## 2020-05-18 NOTE — Assessment & Plan Note (Signed)
Again with borderline low platelets but no abnormal bleeding No action needed

## 2020-05-19 ENCOUNTER — Other Ambulatory Visit: Payer: Self-pay | Admitting: Internal Medicine

## 2020-05-19 ENCOUNTER — Telehealth: Payer: Self-pay

## 2020-05-19 DIAGNOSIS — N1832 Chronic kidney disease, stage 3b: Secondary | ICD-10-CM

## 2020-05-19 NOTE — Chronic Care Management (AMB) (Addendum)
    Chronic Care Management Pharmacy Assistant   Name: Craig Lindsey  MRN: [id number] DOB: 1960/05/14  Reason for Encounter: Disease State - General Adherence Review   Conditions to be addressed/monitored: HTN, DMII and CKD Stage 3a  Recent office visits:  05/17/2020 - Dr. Viviana Simpler  PCP - Changed Oral Semaglutide 3mg  (not covered) to Ozempic 0.25mg  Injection for 4 weeks, then 0.5mg  weekly, labs ordered. F/U in 6 months.  Recent consult visits:  01/12/2020 - Dr.Gregg [name], Cardiology - No medication changes   Hospital visits:  None in previous 6 months  Medications: Outpatient Encounter Medications as of 05/19/2020  Medication Sig   ACCU-CHEK FASTCLIX LANCETS MISC Use to check blood sugar three times a day.  Dx: E11.40   ACCU-CHEK SMARTVIEW test strip USE AS INSTRUCTED TO TEST BLOOD SUGAR THREE TIMES DAILY   amiodarone (PACERONE) 200 MG tablet TAKE 1 TABLET (200 MG TOTAL) BY MOUTH DAILY.   atorvastatin (LIPITOR) 10 MG tablet TAKE 1 TABLET (10 MG TOTAL) BY MOUTH DAILY AT 6 PM.   Blood Pressure Monitor DEVI    calcium carbonate (TUMS - DOSED IN MG ELEMENTAL CALCIUM) 500 MG chewable tablet Chew 1 tablet by mouth 2 (two) times daily.    DILT-XR 240 MG 24 hr capsule TAKE 1 CAPSULE (240 MG TOTAL) BY MOUTH EVERY MORNING.   furosemide (LASIX) 40 MG tablet TAKE 1 TABLET EVERY DAY   glipiZIDE (GLUCOTROL XL) 2.5 MG 24 hr tablet TAKE 1 TABLET DAILY WITH BREAKFAST.   lisinopril (ZESTRIL) 40 MG tablet TAKE 1 TABLET (40 MG TOTAL) BY MOUTH DAILY.   metoprolol succinate (TOPROL-XL) 100 MG 24 hr tablet TAKE 2 TABLETS (200 MG TOTAL) BY MOUTH DAILY.   Multiple Vitamins-Minerals (BARIATRIC MULTIVITAMINS/IRON PO) Take by mouth.   Semaglutide,0.25 or 0.5MG /DOS, 2 MG/1.5ML SOPN Inject 0.25 mg into the skin once a week. For 4 weeks, then 0.5mg  weekly   XARELTO 20 MG TABS tablet TAKE 1 TABLET EVERY DAY WITH SUPPER   No facility-administered encounter medications on file as of 05/19/2020.     Contacted [name] for general disease state call.  Since last visit with CPP, the following interventions have been made.  Start injectable semaglutide 0.25mg  subcutaneous weekly for diabetes and weight loss. The patient  has not had an ED visit since last contact.  The patient's current Chronic and PDC medications are:  Atorvastatin 10 mg  Last filled 01/26/20 90 DS  Glipizide 2.5 mg  Last filled 01/26/20 90 DS  Lisinopril 40 mg  Last filled 1210/22 90 DS  The patient reports the following problems with their health: The patient has some concern regarding his kidney function and has an appointment with a specialist in 2 weeks for assessment. The patient reports the following  problems with his pharmacy: the pharmacy does not have Ozempic but they are ordering this. The patient denies side effects with his medications. He denies concerns or questions for Debbora Dus, PharmD at this time.   Debbora Dus, CPP notified  Avel Sensor, [place] Assistant [phone]  I have reviewed the care management and care coordination activities outlined in this encounter and I am certifying that I agree with the content of this note. Appears statin and glipizide are past due for refill through [facility] mail order. Pt declined CCM visit next month. Will review adherence in July.  Debbora Dus, PharmD Clinical Pharmacist [facility] at [facility] [phone]

## 2020-06-01 ENCOUNTER — Other Ambulatory Visit: Payer: Self-pay | Admitting: Nephrology

## 2020-06-01 DIAGNOSIS — H5213 Myopia, bilateral: Secondary | ICD-10-CM | POA: Diagnosis not present

## 2020-06-01 DIAGNOSIS — I10 Essential (primary) hypertension: Secondary | ICD-10-CM | POA: Diagnosis not present

## 2020-06-01 DIAGNOSIS — N189 Chronic kidney disease, unspecified: Secondary | ICD-10-CM | POA: Insufficient documentation

## 2020-06-01 DIAGNOSIS — N1832 Chronic kidney disease, stage 3b: Secondary | ICD-10-CM

## 2020-06-01 DIAGNOSIS — N2581 Secondary hyperparathyroidism of renal origin: Secondary | ICD-10-CM | POA: Diagnosis not present

## 2020-06-01 DIAGNOSIS — E1122 Type 2 diabetes mellitus with diabetic chronic kidney disease: Secondary | ICD-10-CM | POA: Diagnosis not present

## 2020-06-01 DIAGNOSIS — E119 Type 2 diabetes mellitus without complications: Secondary | ICD-10-CM | POA: Insufficient documentation

## 2020-06-03 DIAGNOSIS — G4733 Obstructive sleep apnea (adult) (pediatric): Secondary | ICD-10-CM | POA: Diagnosis not present

## 2020-06-23 ENCOUNTER — Other Ambulatory Visit: Payer: Self-pay | Admitting: Surgery

## 2020-06-23 DIAGNOSIS — K432 Incisional hernia without obstruction or gangrene: Secondary | ICD-10-CM | POA: Diagnosis not present

## 2020-06-24 ENCOUNTER — Ambulatory Visit
Admission: RE | Admit: 2020-06-24 | Discharge: 2020-06-24 | Disposition: A | Discharge status: Home / Self Care | Payer: Medicare HMO | Source: Ambulatory Visit | Attending: Nephrology | Admitting: Nephrology

## 2020-06-24 ENCOUNTER — Other Ambulatory Visit: Payer: Self-pay

## 2020-06-24 ENCOUNTER — Telehealth: Payer: Self-pay | Admitting: *Deleted

## 2020-06-24 DIAGNOSIS — N1832 Chronic kidney disease, stage 3b: Secondary | ICD-10-CM

## 2020-06-24 DIAGNOSIS — N281 Cyst of kidney, acquired: Secondary | ICD-10-CM | POA: Diagnosis not present

## 2020-06-24 DIAGNOSIS — N189 Chronic kidney disease, unspecified: Secondary | ICD-10-CM | POA: Diagnosis not present

## 2020-06-24 NOTE — Telephone Encounter (Signed)
Will send message to EP scheduler [place] to reach out to the pt with an appt for pre op.

## 2020-06-24 NOTE — Telephone Encounter (Signed)
Primary Cardiologist:Gregg [name], MD  Chart reviewed as part of pre-operative protocol coverage. Because of Craig Lindsey's past medical history and time since last visit, he/she will require a follow-up visit in order to better assess preoperative cardiovascular risk.  Pre-op covering staff: - Please schedule appointment and call patient to inform them. - Please contact requesting surgeon's office via preferred method (i.e, phone, fax) to inform them of need for appointment prior to surgery.  If applicable, this message will also be routed to pharmacy pool and/or primary cardiologist for input on holding anticoagulant/antiplatelet agent as requested below so that this information is available at time of patient's appointment.   Deberah Pelton, NP  06/24/2020, 4:22 PM

## 2020-06-24 NOTE — Telephone Encounter (Signed)
   [facility] Pre-operative Risk Assessment    Patient Name: Craig Lindsey  DOB: 1960-06-14  MRN: [id number]   HEARTCARE STAFF: - Please ensure there is not already an duplicate clearance open for this procedure. - Under Visit Info/Reason for Call, type in Other and utilize the format Clearance MM/DD/YY or Clearance TBD. Do not use dashes or single digits. - If request is for dental extraction, please clarify the # of teeth to be extracted. - If the patient is currently at the dentist's office, call Pre-Op APP to address. If the patient is not currently in the dentist office, please route to the Pre-Op pool  Request for surgical clearance:  What type of surgery is being performed? INCISIONAL HERNIA REPAIR   When is this surgery scheduled? TBD   What type of clearance is required (medical clearance vs. Pharmacy clearance to hold med vs. Both)? BOTH  Are there any medications that need to be held prior to surgery and how long? [facility]   Practice name and name of physician performing surgery? [facility]; DR. Coralie Keens   What is the office phone number? [phone]   7.   What is the office fax number? [place]: MIKEYA DUNBAR, CMA  8.   Anesthesia type (None, local, MAC, general) ? GENERAL   Julaine Hua 06/24/2020, 12:28 PM  _________________________________________________________________   (provider comments below)

## 2020-06-24 NOTE — Telephone Encounter (Signed)
Patient with diagnosis of afib on Xarelto for anticoagulation.    Procedure: incisional hernia repair Date of procedure: 08/12/20 in Epic  CHA2DS2-VASc Score = 3  This indicates a 3.2% annual risk of stroke. The patient's score is based upon: CHF History: Yes HTN History: Yes Diabetes History: Yes Stroke History: No Vascular Disease History: No Age Score: 0 Gender Score: 0  Also with hx of single DVT in 2009 treated with Arixtra  CrCl 64mL/min using adjusted body weight due to morbid obesity Platelet count 147K  Per office protocol, patient can hold Xarelto for 2 days prior to procedure.

## 2020-06-25 NOTE — Telephone Encounter (Signed)
Pt has appt 07/07/20 with Oda Kilts, [facility] for pre op clearance. Will send notes to [facility] for appt. Will send FYI to requesting office pt has appt.

## 2020-07-07 ENCOUNTER — Other Ambulatory Visit: Payer: Self-pay

## 2020-07-07 ENCOUNTER — Ambulatory Visit: Payer: Medicare HMO | Admitting: Student

## 2020-07-07 ENCOUNTER — Encounter: Payer: Self-pay | Admitting: Student

## 2020-07-07 VITALS — BP 126/62 | HR 72 | Ht 73.0 in | Wt 337.8 lb

## 2020-07-07 DIAGNOSIS — I480 Paroxysmal atrial fibrillation: Secondary | ICD-10-CM | POA: Diagnosis not present

## 2020-07-07 DIAGNOSIS — Z01810 Encounter for preprocedural cardiovascular examination: Secondary | ICD-10-CM

## 2020-07-07 DIAGNOSIS — I472 Ventricular tachycardia, unspecified: Secondary | ICD-10-CM

## 2020-07-07 DIAGNOSIS — I5032 Chronic diastolic (congestive) heart failure: Secondary | ICD-10-CM | POA: Diagnosis not present

## 2020-07-07 DIAGNOSIS — Z01818 Encounter for other preprocedural examination: Secondary | ICD-10-CM

## 2020-07-07 LAB — CUP PACEART INCLINIC DEVICE CHECK
Battery Remaining Longevity: 104 mo
Battery Voltage: 3 V
Brady Statistic RV Percent Paced: 12.61 %
Date Time Interrogation Session: 20220622141054
HighPow Impedance: 57 Ohm
HighPow Impedance: 83 Ohm
Implantable Lead Implant Date: 20050113
Implantable Lead Location: 753860
Implantable Lead Model: 6949
Implantable Pulse Generator Implant Date: 20190328
Lead Channel Impedance Value: 475 Ohm
Lead Channel Impedance Value: 665 Ohm
Lead Channel Pacing Threshold Amplitude: 0.75 V
Lead Channel Pacing Threshold Pulse Width: 0.4 ms
Lead Channel Sensing Intrinsic Amplitude: 10.5 mV
Lead Channel Sensing Intrinsic Amplitude: 9.25 mV
Lead Channel Setting Pacing Amplitude: 2.5 V
Lead Channel Setting Pacing Pulse Width: 0.4 ms
Lead Channel Setting Sensing Sensitivity: 0.3 mV

## 2020-07-07 LAB — BASIC METABOLIC PANEL
BUN/Creatinine Ratio: 22 (ref 10–24)
BUN: 34 mg/dL — ABNORMAL HIGH (ref 8–27)
CO2: 23 mmol/L (ref 20–29)
Calcium: 9.8 mg/dL (ref 8.6–10.2)
Chloride: 102 mmol/L (ref 96–106)
Creatinine, Ser: 1.52 mg/dL — ABNORMAL HIGH (ref 0.76–1.27)
Glucose: 106 mg/dL — ABNORMAL HIGH (ref 65–99)
Potassium: 5 mmol/L (ref 3.5–5.2)
Sodium: 142 mmol/L (ref 134–144)
eGFR: 52 mL/min/{1.73_m2} — ABNORMAL LOW (ref >59)

## 2020-07-07 LAB — CBC
Hematocrit: 47.7 % (ref 37.5–51.0)
Hemoglobin: 16.5 g/dL (ref 13.0–17.7)
MCH: 31.9 pg (ref 26.6–33.0)
MCHC: 34.6 g/dL (ref 31.5–35.7)
MCV: 92 fL (ref 79–97)
Platelets: 145 10*3/uL — ABNORMAL LOW (ref 150–450)
RBC: 5.17 x10E6/uL (ref 4.14–5.80)
RDW: 12.9 % (ref 11.6–15.4)
WBC: 7.2 10*3/uL (ref 3.4–10.8)

## 2020-07-07 NOTE — Patient Instructions (Signed)
Medication Instructions:  Your physician recommends that you continue on your current medications as directed. Please refer to the Current Medication list given to you today.  *If you need a refill on your cardiac medications before your next appointment, please call your pharmacy*   Lab Work: TODAY: BMET, CBC  If you have labs (blood work) drawn today and your tests are completely normal, you will receive your results only by: [facility] (if you have MyChart) OR A paper copy in the mail If you have any lab test that is abnormal or we need to change your treatment, we will call you to review the results.   Follow-Up: At [facility], you and your health needs are our priority.  As part of our continuing mission to provide you with exceptional heart care, we have created designated Provider Care Teams.  These Care Teams include your primary Cardiologist (physician) and Advanced Practice Providers (APPs -  Physician Assistants and Nurse Practitioners) who all work together to provide you with the care you need, when you need it.  Your next appointment:   1 year(s)  The format for your next appointment:   In Person  Provider:   You may see Crissie Sickles, MD or one of the following Advanced Practice Providers on your designated Care Team:   Legrand Como "Jonni Sanger" Wright, [place]

## 2020-07-07 NOTE — Progress Notes (Signed)
Electrophysiology Office Note Date: 07/07/2020  ID:  Craig Lindsey, Craig Lindsey Craig Lindsey 16, 1962, MRN [id number]  PCP: Craig Carbon, MD Primary Cardiologist: Craig Peru, MD Electrophysiologist: Craig Peru, MD   CC: Routine ICD follow-up  Craig Lindsey is a 60 y.o. male seen today for Craig Peru, MD for cardiac clearance for hernia repair  Since discharge from hospital the patient reports doing very well.  he denies chest pain, palpitations, dyspnea, PND, orthopnea, nausea, vomiting, dizziness, syncope, edema, weight gain, or early satiety. He has not had ICD shocks.   Device History: Medtronic Single Chamber ICD implanted 2005, gen change 2019  Past Medical History:  Diagnosis Date   Arrhythmia 12/04   1. Ventricular tachycardia; Dr. Lovena Lindsey  2. Atrial fibrillation   Arthritis    knees   Chronic kidney disease    Complex L renal cyst, no tx for    Diabetes mellitus    Type II   DVT (deep venous thrombosis) ([place]) 7/09   RLE, Tx Arixtra   Dysrhythmia    v fib and a fib has icd   Hemorrhoids    hx of   Hyperlipidemia    Hypertension    Neuropathy    Oxaliplatin  from cancer treatment   Obesity    Personal history of colon cancer, stage III 1/09   Dr. Carron Lindsey, Craig Lindsey   Sigmoid colon ulcer 5 yrs ago   Sigmoid; Craig Gaskins, MD (colon cancer)   Sleep apnea    uses cpap setting of 46.9   Umbilical hernia    Past Surgical History:  Procedure Laterality Date   CARDIAC DEFIBRILLATOR PLACEMENT     2004   CHOLECYSTECTOMY     COLECTOMY  1/09   Sigmoid; Craig Bouton, MD (colon cancer)   COLONOSCOPY     COLONOSCOPY N/A 02/25/2013   Procedure: COLONOSCOPY;  Surgeon: Craig Mayer, MD;  Location: [facility];  Service: Endoscopy;  Laterality: N/A;   HERNIA REPAIR  yrs ago   umb hernia   ICD GENERATOR CHANGEOUT N/A 04/12/2017   Procedure: Craig Lindsey;  Surgeon: Craig Lance, MD;  Location: [place] [facility];  Service: Cardiovascular;  Laterality:  N/A;   LAPAROSCOPIC GASTRIC SLEEVE RESECTION     Dr. Lucia Lindsey 04-30-17   LAPAROSCOPIC GASTRIC SLEEVE RESECTION N/A 04/30/2017   Procedure: LAPAROSCOPIC GASTRIC SLEEVE RESECTION WITH UPPER ENDO;  Surgeon: Craig Overall, MD;  Location: [facility];  Service: General;  Laterality: N/A;    Current Outpatient Medications  Medication Sig Dispense Refill   ACCU-CHEK FASTCLIX LANCETS MISC Use to check blood sugar three times a day.  Dx: E11.40 306 each 3   ACCU-CHEK SMARTVIEW test strip USE AS INSTRUCTED TO TEST BLOOD SUGAR THREE TIMES DAILY 300 each 12   amiodarone (PACERONE) 200 MG tablet TAKE 1 TABLET (200 MG TOTAL) BY MOUTH DAILY. 90 tablet 3   atorvastatin (LIPITOR) 10 MG tablet TAKE 1 TABLET (10 MG TOTAL) BY MOUTH DAILY AT 6 PM. 90 tablet 3   Blood Pressure Monitor DEVI      calcium carbonate (TUMS - DOSED IN MG ELEMENTAL CALCIUM) 500 MG chewable tablet Chew 1 tablet by mouth 2 (two) times daily.      DILT-XR 240 MG 24 hr capsule TAKE 1 CAPSULE (240 MG TOTAL) BY MOUTH EVERY MORNING. 90 capsule 3   furosemide (LASIX) 40 MG tablet TAKE 1 TABLET EVERY DAY 90 tablet 3   glipiZIDE (GLUCOTROL XL) 2.5 MG 24 hr tablet TAKE  1 TABLET DAILY WITH BREAKFAST. 90 tablet 3   lisinopril (ZESTRIL) 40 MG tablet TAKE 1 TABLET (40 MG TOTAL) BY MOUTH DAILY. 90 tablet 3   metoprolol succinate (TOPROL-XL) 100 MG 24 hr tablet TAKE 2 TABLETS (200 MG TOTAL) BY MOUTH DAILY. 180 tablet 3   Multiple Vitamins-Minerals (BARIATRIC MULTIVITAMINS/IRON PO) Take by mouth.     Semaglutide,0.25 or 0.5MG /DOS, 2 MG/1.5ML SOPN Inject 0.25 mg into the skin once a week. For 4 weeks, then 0.5mg  weekly 3 each 5   XARELTO 20 MG TABS tablet TAKE 1 TABLET EVERY DAY WITH SUPPER 90 tablet 3   No current facility-administered medications for this visit.    Allergies:   Patient has no known allergies.   Social History: Social History   Socioeconomic History   Marital status: Married    Spouse name: Not on file   Number of children: 2   Years  of education: Not on file   Highest education level: Not on file  Occupational History   Occupation: [employer]    Comment: Disabled  Tobacco Use   Smoking status: Never   Smokeless tobacco: Never  Vaping Use   Vaping Use: Never used  Substance and Sexual Activity   Alcohol use: No    Alcohol/week: 0.0 standard drinks   Drug use: No   Sexual activity: Yes  Other Topics Concern   Not on file  Social History Narrative   2 step children from Craig Lindsey's 2nd marriage.      Has living will   Craig Lindsey is health care POA---alternate would be sister, Craig Lindsey   Would accept resuscitation attempts   Not sure about tube feedings   Social Determinants of Health   Financial Resource Strain: Not on file  Food Insecurity: Not on file  Transportation Needs: Not on file  Physical Activity: Not on file  Stress: Not on file  Social Connections: Not on file  Intimate Partner Violence: Not on file    Family History: Family History  Problem Relation Age of Onset   Diabetes Father    Cancer Father        myelofibrosis   Colon cancer Other    Diabetes Maternal Grandmother    Cancer Maternal Aunt        colon   Hypertension Neg Hx    Coronary artery disease Neg Hx    Prostate cancer Neg Hx    Esophageal cancer Neg Hx    Rectal cancer Neg Hx    Stomach cancer Neg Hx     Review of Systems: All other systems reviewed and are otherwise negative except as noted above.   Physical Exam: Vitals:   07/07/20 1147  BP: 126/62  Pulse: 72  SpO2: 96%  Weight: (!) 337 lb 12.8 oz (153.2 kg)  Height: 6\' 1"  (1.854 m)     GEN- The patient is well appearing, alert and oriented x 3 today.   HEENT: normocephalic, atraumatic; sclera clear, conjunctiva pink; hearing intact; oropharynx clear; neck supple, no JVP Lymph- no cervical lymphadenopathy Lungs- Clear to ausculation bilaterally, normal work of breathing.  No wheezes, rales, rhonchi Heart- Regular rate and rhythm, no murmurs,  rubs or gallops, PMI not laterally displaced GI- soft, non-tender, non-distended, bowel sounds present, no hepatosplenomegaly Extremities- no clubbing or cyanosis. No edema; DP/PT/radial pulses 2+ bilaterally MS- no significant deformity or atrophy Skin- warm and dry, no rash or lesion; ICD pocket well healed Psych- euthymic mood, full affect Neuro- strength and sensation  are intact  ICD interrogation- reviewed in detail today,  See PACEART report  EKG:  EKG is ordered today. The ekg ordered today shows NSR at 72 bpm   Recent Labs: 05/17/2020: ALT 30; BUN 66; Creatinine, Ser 2.25; Hemoglobin 16.8; Platelets 147.0; Potassium 5.4; Sodium 137   Wt Readings from Last 3 Encounters:  07/07/20 (!) 337 lb 12.8 oz (153.2 kg)  05/17/20 (!) 358 lb (162.4 kg)  10/01/19 (!) 378 lb 3.2 oz (171.6 kg)     Other studies Reviewed: Additional studies/ records that were reviewed today include: Previous EP office notes   Assessment and Plan:  1.  VF s/p Medtronic single chamber ICD  No recent arrhythmias euvolemic today Stable on an appropriate medical regimen Normal ICD function See Pace Art report No changes today  2. RV pacing Remains stable ~ 12% of the with LRL of 50.  3. PAF None by device interrogation today Continue Xarelto for CHA2DS2-VASc of at least 5. Continue amiodarone. Recent TFTs and LFTs stable (05/2020)  4. Cardiac clearance for hernia repair Echo 03/2017 with LVEF 55-60% No recent arryhtmias He is at moderate risk of perioperative complications from a cardiac perspective due to Cr > 2.0 and intra-abdominal surgery. He understands the risk of arrhythmias associated with anaesthesia and stress response from surgery. He understands there is a risk of stroke from holding OACs Will also need device clearance with intra-abdominal surgery re: proximity to his device is likely to interfere with device function  Labs today. RTC 12 months. Sooner with issues.   Current medicines  are reviewed at length with the patient today.   The patient does not have concerns regarding his medicines.  The following changes were made today:  none  Labs/ tests ordered today include:  Orders Placed This Encounter  Procedures   CBC   Basic metabolic panel   EKG [id number]     Disposition:   Follow up with Dr. Lovena Lindsey or EP APP in 12 months. Will need amio labs in 6.    Jacalyn Lefevre, PA-C  07/07/2020 12:14 PM  [address] [place] [address] [phone] (office) [phone] (fax)

## 2020-07-25 ENCOUNTER — Other Ambulatory Visit: Payer: Self-pay | Admitting: Internal Medicine

## 2020-07-25 DIAGNOSIS — I5032 Chronic diastolic (congestive) heart failure: Secondary | ICD-10-CM

## 2020-07-26 NOTE — Telephone Encounter (Signed)
Most recent BMET has abnormal BUN/Creatinine but normal Sodium and Potassium. Ok to fill furosemide?

## 2020-07-27 ENCOUNTER — Telehealth: Payer: Medicare HMO

## 2020-07-28 ENCOUNTER — Telehealth: Payer: Self-pay

## 2020-07-28 NOTE — Telephone Encounter (Signed)
[employer] sent a refill request for Diltiazem and Ozempic. We sent Diltiazem in January for 1 year. Ozempic rx states he is not taking it.

## 2020-07-29 ENCOUNTER — Telehealth: Payer: Self-pay

## 2020-07-29 NOTE — Telephone Encounter (Signed)
  Chronic Care Management   Outreach Note  07/29/2020 Name: Craig Lindsey MRN: [id number] DOB: 10-17-1960  Referred by: Venia Carbon, MD Reason for referral : CCM  Third unsuccessful telephone outreach was attempted today. The patient was referred to the pharmacist for assistance with care management and care coordination.  Patient will be unenrolled at this time. A new referral may be placed if services are needed.  Debbora Dus, PharmD Clinical Pharmacist [facility] at [facility] [phone]

## 2020-08-02 NOTE — Progress Notes (Signed)
Patient is scheduled for PAT appointment on 08/03/2020 for the surgery on 08/12/2020. Medtronic device rep. Renae Fickle was contacted via e-mail and phone for information about the patient.

## 2020-08-02 NOTE — Progress Notes (Signed)
Surgical Instructions    Your procedure is scheduled on Thursday, July 28th, 2022.  Report to [facility] Main Entrance "A" at 05:30 A.M., then check in with the Admitting office.  Call this number if you have problems the morning of surgery:  [phone]   If you have any questions prior to your surgery date call [phone]: Open Monday-Friday 8am-4pm    Remember:  Do not eat after midnight the night before your surgery  You may drink clear liquids until 04:30 the morning of your surgery.   Clear liquids allowed are: Water, Non-Citrus Juices (without pulp), Carbonated Beverages, Clear Tea, Black Coffee Only, and Gatorade  Patient Instructions   The day of surgery (if you have diabetes): Drink ONE (1) 12 oz G2 given to you in your pre admission testing appointment by 04:30 the morning of surgery. Drink in one sitting. Do not sip.  This drink was given to you during your hospital  pre-op appointment visit.  Nothing else to drink after completing the  12 oz bottle of G2.         If you have questions, please contact your surgeon's office.     Take these medicines the morning of surgery with A SIP OF WATER:  amiodarone (PACERONE)  DILT-XR 240 metoprolol succinate (TOPROL-XL)  If needed:  acetaminophen (TYLENOL)   WHAT DO I DO ABOUT MY DIABETES MEDICATION?   Do not take glipiZIDE (GLUCOTROL XL) the evening before surgery and the morning of surgery.  HOW TO MANAGE YOUR DIABETES BEFORE AND AFTER SURGERY  Why is it important to control my blood sugar before and after surgery? Improving blood sugar levels before and after surgery helps healing and can limit problems. A way of improving blood sugar control is eating a healthy diet by:  Eating less sugar and carbohydrates  Increasing activity/exercise  Talking with your doctor about reaching your blood sugar goals High blood sugars (greater than 180 mg/dL) can raise your risk of infections and slow your recovery, so you  will need to focus on controlling your diabetes during the weeks before surgery. Make sure that the doctor who takes care of your diabetes knows about your planned surgery including the date and location.  How do I manage my blood sugar before surgery? Check your blood sugar at least 4 times a day, starting 2 days before surgery, to make sure that the level is not too high or low.  Check your blood sugar the morning of your surgery when you wake up and every 2 hours until you get to the Short Stay unit.  If your blood sugar is less than 70 mg/dL, you will need to treat for low blood sugar: Do not take insulin. Treat a low blood sugar (less than 70 mg/dL) with  cup of clear juice (cranberry or apple), 4 glucose tablets, OR glucose gel. Recheck blood sugar in 15 minutes after treatment (to make sure it is greater than 70 mg/dL). If your blood sugar is not greater than 70 mg/dL on recheck, call [phone] for further instructions. Report your blood sugar to the short stay nurse when you get to Short Stay.  If you are admitted to the hospital after surgery: Your blood sugar will be checked by the staff and you will probably be given insulin after surgery (instead of oral diabetes medicines) to make sure you have good blood sugar levels. The goal for blood sugar control after surgery is 80-180 mg/dL.   Follow your surgeon's instructions on when  to stop Xarelto.  If no instructions were given by your surgeon then you will need to call the office to get those instructions.     As of today, STOP taking any Aspirin (unless otherwise instructed by your surgeon) Aleve, Naproxen, Ibuprofen, Motrin, Advil, Goody's, BC's, all herbal medications, fish oil, and all vitamins.          Do not wear jewelry  Do not wear lotions, powders, colognes, or deodorant. Men may shave face and neck. Do not bring valuables to the hospital. DO Not wear nail polish, gel polish, artificial nails, or any other type of  covering on natural nails including finger and toenails. If patients have artificial nails, gel coating, etc. that need to be removed by a nail salon please have this removed prior to surgery or surgery may need to be canceled/delayed if the surgeon/ anesthesia feels like the patient is unable to be adequately monitored.             [facility] is not responsible for any belongings or valuables.  Do NOT Smoke (Tobacco/Vaping) or drink Alcohol 24 hours prior to your procedure If you use a CPAP at night, you may bring all equipment for your overnight stay.   Contacts, glasses, dentures or bridgework may not be worn into surgery, please bring cases for these belongings   For patients admitted to the hospital, discharge time will be determined by your treatment team.   Patients discharged the day of surgery will not be allowed to drive home, and someone needs to stay with them for 24 hours.  ONLY 1 SUPPORT PERSON MAY BE PRESENT WHILE YOU ARE IN SURGERY. IF YOU ARE TO BE ADMITTED ONCE YOU ARE IN YOUR ROOM YOU WILL BE ALLOWED TWO (2) VISITORS.  Minor children may have two parents present. Special consideration for safety and communication needs will be reviewed on a case by case basis.  Special instructions:    Oral Hygiene is also important to reduce your risk of infection.  Remember - BRUSH YOUR TEETH THE MORNING OF SURGERY WITH YOUR REGULAR TOOTHPASTE   [facility]- Preparing For Surgery  Before surgery, you can play an important role. Because skin is not sterile, your skin needs to be as free of germs as possible. You can reduce the number of germs on your skin by washing with CHG (chlorahexidine gluconate) Soap before surgery.  CHG is an antiseptic cleaner which kills germs and bonds with the skin to continue killing germs even after washing.     Please do not use if you have an allergy to CHG or antibacterial soaps. If your skin becomes reddened/irritated stop using the CHG.  Do not shave  (including legs and underarms) for at least 48 hours prior to first CHG shower. It is OK to shave your face.  Please follow these instructions carefully.     Shower the NIGHT BEFORE SURGERY and the MORNING OF SURGERY with CHG Soap.   If you chose to wash your hair, wash your hair first as usual with your normal shampoo. After you shampoo, rinse your hair and body thoroughly to remove the shampoo.  Then [employer] and genitals (private parts) with your normal soap and rinse thoroughly to remove soap.  After that Use CHG Soap as you would any other liquid soap. You can apply CHG directly to the skin and wash gently with a scrungie or a clean washcloth.   Apply the CHG Soap to your body ONLY FROM THE  NECK DOWN.  Do not use on open wounds or open sores. Avoid contact with your eyes, ears, mouth and genitals (private parts). Wash Face and genitals (private parts)  with your normal soap.   Wash thoroughly, paying special attention to the area where your surgery will be performed.  Thoroughly rinse your body with warm water from the neck down.  DO NOT shower/wash with your normal soap after using and rinsing off the CHG Soap.  Pat yourself dry with a CLEAN TOWEL.  Wear CLEAN PAJAMAS to bed the night before surgery  Place CLEAN SHEETS on your bed the night before your surgery  DO NOT SLEEP WITH PETS.   Day of Surgery:  Take a shower with CHG soap. Wear Clean/Comfortable clothing the morning of surgery Do not apply any deodorants/lotions.   Remember to brush your teeth WITH YOUR REGULAR TOOTHPASTE.   Please read over the following fact sheets that you were given.

## 2020-08-03 ENCOUNTER — Encounter (HOSPITAL_COMMUNITY): Payer: Self-pay

## 2020-08-03 ENCOUNTER — Encounter: Payer: Self-pay | Admitting: Internal Medicine

## 2020-08-03 ENCOUNTER — Other Ambulatory Visit: Payer: Self-pay

## 2020-08-03 ENCOUNTER — Encounter (HOSPITAL_COMMUNITY)
Admission: RE | Admit: 2020-08-03 | Discharge: 2020-08-03 | Disposition: A | Discharge status: Home / Self Care | Payer: Medicare HMO | Source: Ambulatory Visit | Attending: Surgery | Admitting: Surgery

## 2020-08-03 DIAGNOSIS — Z01812 Encounter for preprocedural laboratory examination: Secondary | ICD-10-CM | POA: Insufficient documentation

## 2020-08-03 DIAGNOSIS — G4733 Obstructive sleep apnea (adult) (pediatric): Secondary | ICD-10-CM | POA: Insufficient documentation

## 2020-08-03 DIAGNOSIS — Z79899 Other long term (current) drug therapy: Secondary | ICD-10-CM | POA: Diagnosis not present

## 2020-08-03 DIAGNOSIS — I480 Paroxysmal atrial fibrillation: Secondary | ICD-10-CM | POA: Insufficient documentation

## 2020-08-03 HISTORY — DX: Presence of automatic (implantable) cardiac defibrillator: Z95.810

## 2020-08-03 HISTORY — DX: Malignant (primary) neoplasm, unspecified: C80.1

## 2020-08-03 LAB — CBC
HCT: 53.2 % — ABNORMAL HIGH (ref 39.0–52.0)
Hemoglobin: 17.5 g/dL — ABNORMAL HIGH (ref 13.0–17.0)
MCH: 31.1 pg (ref 26.0–34.0)
MCHC: 32.9 g/dL (ref 30.0–36.0)
MCV: 94.5 fL (ref 80.0–100.0)
Platelets: 141 10*3/uL — ABNORMAL LOW (ref 150–400)
RBC: 5.63 MIL/uL (ref 4.22–5.81)
RDW: 13.3 % (ref 11.5–15.5)
WBC: 7.4 10*3/uL (ref 4.0–10.5)
nRBC: 0 % (ref 0.0–0.2)

## 2020-08-03 LAB — HEMOGLOBIN A1C
Hgb A1c MFr Bld: 5.1 % (ref 4.8–5.6)
Mean Plasma Glucose: 99.67 mg/dL

## 2020-08-03 LAB — GLUCOSE, CAPILLARY: Glucose-Capillary: 131 mg/dL — ABNORMAL HIGH (ref 70–99)

## 2020-08-03 NOTE — Progress Notes (Signed)
[facility] DEVICE PROGRAMMING  Patient Information: Name:  Craig Lindsey  DOB:  [birth year]  MRN:  [id number]    Planned Procedure:  Incisional hernia repair  Surgeon:  Dr. Coralie Keens  Date of Procedure:  08/12/2020  Cautery will be used.  Position during surgery:  supine   Please send documentation back to:  [facility] (Fax # [phone])  Device Information:  Clinic EP Physician:  Cristopher Peru, MD   Device Type:  Defibrillator Manufacturer and Phone #:  Medtronic: [id number] Pacemaker Dependent?:  No. Date of Last Device Check:  07/07/20 Normal Device Function?:  Yes.    Electrophysiologist's Recommendations:  Have magnet available. Provide continuous ECG monitoring when magnet is used or reprogramming is to be performed.  Procedure may interfere with device function.  Magnet should be placed over device during procedure.  Per Device Clinic Standing Orders, Simone Curia, RN  8:25 AM 08/03/2020

## 2020-08-03 NOTE — Progress Notes (Addendum)
PCP - Venia Carbon MD Cardiologist - Crissie Sickles MD  PPM/ICD - ICD Device Orders - yes Rep Notified - yes  Chest x-ray -  EKG - 07/07/20 Stress Test -  ECHO - 04/06/17 Cardiac Cath -   Sleep Study - 18 years ago CPAP - yes  Fasting Blood Sugar -130's  Checks Blood Sugar once a week  Blood Thinner Instructions: Xarelto per Dr. Ninfa Linden Aspirin Instructions:n/a  ERAS Protcol -yes-clear liquids until 0430 PRE-SURGERY Ensure or G2- G2  COVID TEST- scheduled  for 08/09/20   Anesthesia review: yes-ICD Medtronic  Patient denies shortness of breath, fever, cough and chest pain at PAT appointment   All instructions explained to the patient, with a verbal understanding of the material. Patient agrees to go over the instructions while at home for a better understanding. Patient also instructed to self quarantine after being tested for COVID-19. The opportunity to ask questions was provided.

## 2020-08-04 NOTE — Progress Notes (Signed)
Anesthesia Chart Review:  Follows with the EP cardiology for history of VF s/p Medtronic single-chamber ICD, paroxysmal atrial fibrillation on Xarelto, left bundle branch block.  Last seen 07/08/2018 for preop clearance.  Per note, "Cardiac clearance for hernia repair: Echo 03/2017 with LVEF 55-60%. No recent arryhtmias. He is at moderate risk of perioperative complications from a cardiac perspective due to Cr > 2.0 and intra-abdominal surgery. He understands the risk of arrhythmias associated with anaesthesia and stress response from surgery. He understands there is a risk of stroke from holding OACs. Will also need device clearance with intra-abdominal surgery re: proximity to his device is likely to interfere with device function."  OSA on CPAP.  History of CKD 3 followed by nephrologist Dr. Holley Raring, recent baseline creatinine appears to be around 1.8.  DM2 well-controlled, last A1c 5.1 on 08/03/2020.  Pt will need BMP on DOS, sample drawn at PAT was hemolyzed.  CBC reviewed, mild thrombocytopenia with platelets 141 (chronic), remainder unremarkable.  EKG 07/07/2020: Sinus rhythm.  Rate 72.  Left bundle branch block.  Perioperative prescription for implanted cardiac device programming per note 08/03/2020: Device Information:   Clinic EP Physician:  Cristopher Peru, MD    Device Type:  Defibrillator Manufacturer and Phone #:  Medtronic: [id number] Pacemaker Dependent?:  No. Date of Last Device Check:  07/07/20           Normal Device Function?:  Yes.     Electrophysiologist's Recommendations:   Have magnet available. Provide continuous ECG monitoring when magnet is used or reprogramming is to be performed.  Procedure may interfere with device function.  Magnet should be placed over device during procedure.   TTE 04/06/2017: - Procedure narrative: Transthoracic echocardiography. Image    quality was suboptimal. The study was technically difficult.    Intravenous contrast (Definity) was  administered.  - Left ventricle: The cavity size was mildly dilated. Systolic    function was normal. The estimated ejection fraction was in the    range of 55% to 60%. Wall motion was normal; there were no    regional wall motion abnormalities. Features are consistent with    a pseudonormal left ventricular filling pattern, with concomitant    abnormal relaxation and increased filling pressure (grade 2    diastolic dysfunction). Acoustic contrast opacification revealed    no evidence ofthrombus.  - Left atrium: The atrium was moderately dilated.  - Right ventricle: The cavity size was moderately dilated. Wall    thickness was normal.     [name] [facility] Short Stay Center/Anesthesiology Phone [phone] 08/04/2020 4:26 PM

## 2020-08-04 NOTE — Anesthesia Preprocedure Evaluation (Addendum)
Anesthesia Evaluation  Patient identified by MRN, date of birth, ID band Patient awake    Reviewed: Allergy & Precautions, NPO status , Patient's Chart, lab work & pertinent test results  Airway Mallampati: I  TM Distance: >3 FB Neck ROM: Full    Dental no notable dental hx. (+) Teeth Intact, Dental Advisory Given   Pulmonary sleep apnea and Continuous Positive Airway Pressure Ventilation ,  History of tracheostomy    Pulmonary exam normal breath sounds clear to auscultation       Cardiovascular hypertension, Pt. on medications and Pt. on home beta blockers +CHF and + DVT  Normal cardiovascular exam+ dysrhythmias Atrial Fibrillation + Cardiac Defibrillator  Rhythm:Regular Rate:Normal  ECG: NSR, rate 72   Neuro/Psych negative neurological ROS  negative psych ROS   GI/Hepatic negative GI ROS, Neg liver ROS,   Endo/Other  diabetes, Oral Hypoglycemic AgentsMorbid obesity  Renal/GU Renal disease     Musculoskeletal  (+) Arthritis ,   Abdominal (+) + obese,   Peds  Hematology HLD   Anesthesia Other Findings incisional hernia  Reproductive/Obstetrics                          Anesthesia Physical Anesthesia Plan  ASA: 3  Anesthesia Plan: General   Post-op Pain Management:    Induction: Intravenous  PONV Risk Score and Plan: 2 and Ondansetron, Dexamethasone, Midazolam and Treatment may vary due to age or medical condition  Airway Management Planned: Oral ETT  Additional Equipment:   Intra-op Plan:   Post-operative Plan: Extubation in OR  Informed Consent: I have reviewed the patients History and Physical, chart, labs and discussed the procedure including the risks, benefits and alternatives for the proposed anesthesia with the patient or authorized representative who has indicated his/her understanding and acceptance.     Dental advisory given  Plan Discussed with: CRNA  Anesthesia  Plan Comments: (PAT note by Karoline Caldwell, PA-C: Follows with the EP cardiology for history of VF s/p Medtronic single-chamber ICD, paroxysmal atrial fibrillation on Xarelto, left bundle branch block.  Last seen 07/08/2018 for preop clearance.  Per note, "Cardiac clearance for hernia repair: Echo 03/2017 with LVEF 55-60%. No recent arryhtmias. He is at moderate risk of perioperative complications from a cardiac perspective due to Cr > 2.0 and intra-abdominal surgery. He understands the risk of arrhythmias associated with anaesthesia and stress response from surgery. He understands there is a risk of stroke from holding OACs. Will also need device clearance with intra-abdominal surgery re: proximity to his device is likely to interfere with device function."  OSA on CPAP.  History of CKD 3 followed by nephrologist Dr. Holley Raring, recent baseline creatinine appears to be around 1.8.  DM2 well-controlled, last A1c 5.1 on 08/03/2020.  Pt will need BMP on DOS, sample drawn at PAT was hemolyzed.  CBC reviewed, mild thrombocytopenia with platelets 141 (chronic), remainder unremarkable.  EKG 07/07/2020: Sinus rhythm.  Rate 72.  Left bundle branch block.  Perioperative prescription for implanted cardiac device programming per note 08/03/2020: Device Information:  Clinic EP Physician:Gregg [name], MD  Device Type:Defibrillator Manufacturer and Phone #:Medtronic: [phone] Pacemaker Dependent?:No. Date of Last Device Check:6/22/22Normal Device Function?:Yes.  Electrophysiologist's Recommendations:  . Have magnet available. . Provide continuous ECG monitoring when magnet is used or reprogramming is to be performed. . Procedure may interfere with device function. Magnet should be placed over device during procedure.  TTE 04/06/2017: - Procedure narrative: Transthoracic echocardiography. Image  quality was suboptimal.  The study was technically difficult.  Intravenous  contrast (Definity) was administered.  - Left ventricle: The cavity size was mildly dilated. Systolic  function was normal. The estimated ejection fraction was in the  range of 55% to 60%. Wall motion was normal; there were no  regional wall motion abnormalities. Features are consistent with  a pseudonormal left ventricular filling pattern, with concomitant  abnormal relaxation and increased filling pressure (grade 2  diastolic dysfunction). Acoustic contrast opacification revealed  no evidence ofthrombus.  - Left atrium: The atrium was moderately dilated.  - Right ventricle: The cavity size was moderately dilated. Wall  thickness was normal.   )       Anesthesia Quick Evaluation

## 2020-08-09 ENCOUNTER — Other Ambulatory Visit
Admission: RE | Admit: 2020-08-09 | Discharge: 2020-08-09 | Disposition: A | Discharge status: Home / Self Care | Payer: Medicare HMO | Source: Ambulatory Visit | Attending: Surgery | Admitting: Surgery

## 2020-08-09 ENCOUNTER — Other Ambulatory Visit: Payer: Self-pay

## 2020-08-09 DIAGNOSIS — Z20822 Contact with and (suspected) exposure to covid-19: Secondary | ICD-10-CM | POA: Diagnosis not present

## 2020-08-09 DIAGNOSIS — Z01812 Encounter for preprocedural laboratory examination: Secondary | ICD-10-CM | POA: Insufficient documentation

## 2020-08-09 LAB — SARS CORONAVIRUS 2 (TAT 6-24 HRS): SARS Coronavirus 2: NEGATIVE

## 2020-08-11 NOTE — H&P (Addendum)
Craig Lindsey  Location: [facility] Patient #: [id number] DOB: [birth year] Married / Language: English / Race: White Male   History of Present Illness  The patient is a 60 year old male who presents with an incisional hernia.  Chief complaint: Incisional hernia  This patient is well known to our group.  He has had multiple surgical procedures by Dr. Lucia Gaskins including colon resection, cholecystectomy, and sleeve gastrectomy.  He has a defibrillator and is also relatively.  He has had a known incisional hernia which is being followed.  As he has lost weight, he reports the hernia is getting larger and causing some mild discomfort but no obstructive symptoms.  He is stable from a cardiopulmonary standpoint per his report.  Past Medical History:  Diagnosis Date   Arrhythmia 12/04    1. Ventricular tachycardia; Dr. [name]  2. Atrial fibrillation   Arthritis      knees   Chronic kidney disease      Complex L renal cyst, no tx for   Diabetes mellitus      Type II   DVT (deep venous thrombosis) ([place]) 7/09    RLE, Tx Arixtra   Dysrhythmia      v fib and a fib has icd   Hemorrhoids      hx of   Hyperlipidemia     Hypertension     Neuropathy      Oxaliplatin  from cancer treatment   Obesity     Personal history of colon cancer, stage III 1/09    Dr. Carron Brazen, D. [place]   Sigmoid colon ulcer 5 yrs ago    Sigmoid; DLucia Gaskins, MD (colon cancer)   Sleep apnea      uses cpap setting of [id number]   Umbilical hernia           Past Surgical History:  Procedure Laterality Date   CARDIAC DEFIBRILLATOR PLACEMENT        2004   CHOLECYSTECTOMY       COLECTOMY   1/09    Sigmoid; Nydia Bouton, MD (colon cancer)   COLONOSCOPY       COLONOSCOPY N/A 02/25/2013    Procedure: COLONOSCOPY;  Surgeon: Gatha Mayer, MD;  Location: [facility] ENDOSCOPY;  Service: Endoscopy;  Laterality: N/A;   HERNIA REPAIR   yrs ago    umb hernia   ICD GENERATOR CHANGEOUT N/A 04/12/2017    Procedure: [place];  Surgeon: Evans Lance, MD;  Location: [facility] CV LAB;  Service: Cardiovascular;  Laterality: N/A;   LAPAROSCOPIC GASTRIC SLEEVE RESECTION        Dr. Lucia Gaskins 04-30-17   LAPAROSCOPIC GASTRIC SLEEVE RESECTION N/A 04/30/2017    Procedure: LAPAROSCOPIC GASTRIC SLEEVE RESECTION WITH UPPER ENDO;  Surgeon: Alphonsa Overall, MD;  Location: [facility] ORS;  Service: General;  Laterality: N/A;      Allergies Janeann Forehand, CNA;  No Known Drug Allergies    Allergies Reconciled    Medication History Janeann Forehand, CNA;  Xarelto  ('20MG'$  Tablet, Oral) Active. Amiodarone HCl  ('200MG'$  Tablet, Oral) Active. Metoprolol Succinate ER  ('100MG'$  Tablet ER 24HR, Oral) Active. Furosemide  ('40MG'$  Tablet, Oral) Active. Accu-Chek SmartView  (In Vitro) Active. Atorvastatin Calcium  ('10MG'$  Tablet, Oral daily) Active. GlipiZIDE XL  ('10MG'$  Tablet ER 24HR, Oral daily) Active. Lisinopril  ('40MG'$  Tablet, Oral daily) Active. NovoLOG Mix 70/30 FlexPen  ((70-30) 100UNIT/ML Susp Pen-inj, Subcutaneous daily) Active. Dilt-XR  ('240MG'$  Capsule ER 24HR, Oral daily) Active. Medications Reconciled  Ozempic (0.25 or 0.5 MG/DOSE)  ('2MG'$ /1.5ML Soln Pen-inj, Subcutaneous) Active.  ROS otherwise negative  Physical Exam  The physical exam findings are as follows: Note:  He appears well exam.  Abdomen soft and obese. There is a moderate sized incisional hernia to the right of the umbilicus. I can completely reduce the hernia. It is nontender. The fascial defect feels almost 6 cm in size.  Lungs clear  CV RRR  Neuro grossly intact    Assessment & Plan   INCISIONAL HERNIA (K43.2)  Impression: I reviewed his notes in the electronic medical records. He indeed has a moderate size incisional hernia. He is well aware of the diagnosis as the hernia has been followed for some time. He is now wanting repair given the increasing sized symptoms which I feel is reasonable. We will have to get cardiac clearance and he will  have to come off of his anticoagulation medication for 2 days preoperatively. I would like to do this as an open repair with mesh possibly with LMA to reduce his anesthesia. We discussed the procedure detail. I discussed the risks which includes but is not limited to bleeding, infection, injury to surrounding structures, the use of mesh, hernia recurrence, cardiopulmonary issues, etc. Surgery will be scheduled once cardiac clearance is obtained.

## 2020-08-12 ENCOUNTER — Encounter (HOSPITAL_COMMUNITY)
Admission: RE | Disposition: A | Discharge status: Home / Self Care | Payer: Self-pay | Source: Home / Self Care | Attending: Surgery

## 2020-08-12 ENCOUNTER — Ambulatory Visit (HOSPITAL_COMMUNITY): Payer: Medicare HMO | Admitting: Anesthesiology

## 2020-08-12 ENCOUNTER — Encounter (HOSPITAL_COMMUNITY): Payer: Self-pay | Admitting: Surgery

## 2020-08-12 ENCOUNTER — Ambulatory Visit (HOSPITAL_COMMUNITY): Payer: Medicare HMO | Admitting: Physician Assistant

## 2020-08-12 ENCOUNTER — Ambulatory Visit (HOSPITAL_COMMUNITY)
Admission: RE | Admit: 2020-08-12 | Discharge: 2020-08-12 | Disposition: A | Discharge status: Home / Self Care | Payer: Medicare HMO | Attending: Surgery | Admitting: Surgery

## 2020-08-12 ENCOUNTER — Other Ambulatory Visit: Payer: Self-pay

## 2020-08-12 DIAGNOSIS — Z79899 Other long term (current) drug therapy: Secondary | ICD-10-CM | POA: Insufficient documentation

## 2020-08-12 DIAGNOSIS — Z95810 Presence of automatic (implantable) cardiac defibrillator: Secondary | ICD-10-CM | POA: Insufficient documentation

## 2020-08-12 DIAGNOSIS — Z9884 Bariatric surgery status: Secondary | ICD-10-CM | POA: Diagnosis not present

## 2020-08-12 DIAGNOSIS — K432 Incisional hernia without obstruction or gangrene: Secondary | ICD-10-CM | POA: Diagnosis not present

## 2020-08-12 DIAGNOSIS — Z7984 Long term (current) use of oral hypoglycemic drugs: Secondary | ICD-10-CM | POA: Insufficient documentation

## 2020-08-12 DIAGNOSIS — Z86718 Personal history of other venous thrombosis and embolism: Secondary | ICD-10-CM | POA: Insufficient documentation

## 2020-08-12 DIAGNOSIS — Z794 Long term (current) use of insulin: Secondary | ICD-10-CM | POA: Diagnosis not present

## 2020-08-12 DIAGNOSIS — Z7901 Long term (current) use of anticoagulants: Secondary | ICD-10-CM | POA: Diagnosis not present

## 2020-08-12 DIAGNOSIS — I130 Hypertensive heart and chronic kidney disease with heart failure and stage 1 through stage 4 chronic kidney disease, or unspecified chronic kidney disease: Secondary | ICD-10-CM | POA: Diagnosis not present

## 2020-08-12 DIAGNOSIS — N1831 Chronic kidney disease, stage 3a: Secondary | ICD-10-CM | POA: Diagnosis not present

## 2020-08-12 DIAGNOSIS — Z85038 Personal history of other malignant neoplasm of large intestine: Secondary | ICD-10-CM | POA: Insufficient documentation

## 2020-08-12 DIAGNOSIS — I5032 Chronic diastolic (congestive) heart failure: Secondary | ICD-10-CM | POA: Diagnosis not present

## 2020-08-12 DIAGNOSIS — E1122 Type 2 diabetes mellitus with diabetic chronic kidney disease: Secondary | ICD-10-CM | POA: Diagnosis not present

## 2020-08-12 HISTORY — PX: INCISIONAL HERNIA REPAIR: SHX193

## 2020-08-12 LAB — BASIC METABOLIC PANEL
Anion gap: 8 (ref 5–15)
BUN: 41 mg/dL — ABNORMAL HIGH (ref 6–20)
CO2: 26 mmol/L (ref 22–32)
Calcium: 8.9 mg/dL (ref 8.9–10.3)
Chloride: 103 mmol/L (ref 98–111)
Creatinine, Ser: 1.62 mg/dL — ABNORMAL HIGH (ref 0.61–1.24)
GFR, Estimated: 48 mL/min — ABNORMAL LOW (ref >60)
Glucose, Bld: 118 mg/dL — ABNORMAL HIGH (ref 70–99)
Potassium: 4.1 mmol/L (ref 3.5–5.1)
Sodium: 137 mmol/L (ref 135–145)

## 2020-08-12 LAB — GLUCOSE, CAPILLARY
Glucose-Capillary: 109 mg/dL — ABNORMAL HIGH (ref 70–99)
Glucose-Capillary: 111 mg/dL — ABNORMAL HIGH (ref 70–99)

## 2020-08-12 SURGERY — REPAIR, HERNIA, INCISIONAL
Anesthesia: General

## 2020-08-12 MED ORDER — DEXAMETHASONE SODIUM PHOSPHATE 10 MG/ML IJ SOLN
INTRAMUSCULAR | Status: AC
  Filled 2020-08-12: qty 1

## 2020-08-12 MED ORDER — FENTANYL CITRATE (PF) 250 MCG/5ML IJ SOLN
INTRAMUSCULAR | Status: DC | PRN
  Administered 2020-08-12: 150 ug via INTRAVENOUS

## 2020-08-12 MED ORDER — CALCIUM CHLORIDE 10 % IV SOLN
INTRAVENOUS | Status: AC
  Filled 2020-08-12: qty 10

## 2020-08-12 MED ORDER — DEXAMETHASONE SODIUM PHOSPHATE 10 MG/ML IJ SOLN
INTRAMUSCULAR | Status: DC | PRN
  Administered 2020-08-12: 10 mg via INTRAVENOUS

## 2020-08-12 MED ORDER — VASOPRESSIN 20 UNIT/ML IV SOLN
INTRAVENOUS | Status: DC | PRN
  Administered 2020-08-12 (×5): 1 [IU] via INTRAVENOUS

## 2020-08-12 MED ORDER — EPHEDRINE 5 MG/ML INJ
INTRAVENOUS | Status: AC
  Filled 2020-08-12: qty 5

## 2020-08-12 MED ORDER — PROPOFOL 10 MG/ML IV BOLUS
INTRAVENOUS | Status: DC | PRN
Start: 2020-08-12 — End: 2020-08-12
  Administered 2020-08-12: 200 mg via INTRAVENOUS

## 2020-08-12 MED ORDER — BUPIVACAINE-EPINEPHRINE 0.25% -1:200000 IJ SOLN
INTRAMUSCULAR | Status: DC | PRN
  Administered 2020-08-12: 17 mL

## 2020-08-12 MED ORDER — MIDAZOLAM HCL 5 MG/5ML IJ SOLN
INTRAMUSCULAR | Status: DC | PRN
  Administered 2020-08-12: 2 mg via INTRAVENOUS

## 2020-08-12 MED ORDER — PHENYLEPHRINE 40 MCG/ML (10ML) SYRINGE FOR IV PUSH (FOR BLOOD PRESSURE SUPPORT)
PREFILLED_SYRINGE | INTRAVENOUS | Status: AC
  Filled 2020-08-12: qty 10

## 2020-08-12 MED ORDER — CALCIUM CHLORIDE 10 % IV SOLN
INTRAVENOUS | Status: DC | PRN
  Administered 2020-08-12: 200 mg via INTRAVENOUS
  Administered 2020-08-12: 100 mg via INTRAVENOUS

## 2020-08-12 MED ORDER — SUCCINYLCHOLINE CHLORIDE 200 MG/10ML IV SOSY
PREFILLED_SYRINGE | INTRAVENOUS | Status: DC | PRN
  Administered 2020-08-12: 140 mg via INTRAVENOUS

## 2020-08-12 MED ORDER — ALBUMIN HUMAN 5 % IV SOLN
INTRAVENOUS | Status: AC
  Filled 2020-08-12: qty 250

## 2020-08-12 MED ORDER — LIDOCAINE 2% (20 MG/ML) 5 ML SYRINGE
INTRAMUSCULAR | Status: DC | PRN
  Administered 2020-08-12: 60 mg via INTRAVENOUS

## 2020-08-12 MED ORDER — ONDANSETRON HCL 4 MG/2ML IJ SOLN
INTRAMUSCULAR | Status: DC | PRN
  Administered 2020-08-12: 4 mg via INTRAVENOUS

## 2020-08-12 MED ORDER — PROPOFOL 10 MG/ML IV BOLUS
INTRAVENOUS | Status: AC
  Filled 2020-08-12: qty 40

## 2020-08-12 MED ORDER — MIDAZOLAM HCL 2 MG/2ML IJ SOLN
INTRAMUSCULAR | Status: AC
  Filled 2020-08-12: qty 2

## 2020-08-12 MED ORDER — FENTANYL CITRATE (PF) 250 MCG/5ML IJ SOLN
INTRAMUSCULAR | Status: AC
  Filled 2020-08-12: qty 5

## 2020-08-12 MED ORDER — CHLORHEXIDINE GLUCONATE 0.12 % MT SOLN
15.0000 mL | Freq: Once | OROMUCOSAL | Status: AC
  Administered 2020-08-12: 15 mL via OROMUCOSAL
  Filled 2020-08-12: qty 15

## 2020-08-12 MED ORDER — GLYCOPYRROLATE PF 0.2 MG/ML IJ SOSY
PREFILLED_SYRINGE | INTRAMUSCULAR | Status: AC
  Filled 2020-08-12: qty 1

## 2020-08-12 MED ORDER — ONDANSETRON HCL 4 MG/2ML IJ SOLN
INTRAMUSCULAR | Status: AC
  Filled 2020-08-12: qty 2

## 2020-08-12 MED ORDER — EPHEDRINE SULFATE-NACL 50-0.9 MG/10ML-% IV SOSY
PREFILLED_SYRINGE | INTRAVENOUS | Status: DC | PRN
  Administered 2020-08-12: 5 mg via INTRAVENOUS
  Administered 2020-08-12 (×2): 10 mg via INTRAVENOUS
  Administered 2020-08-12: 5 mg via INTRAVENOUS
  Administered 2020-08-12 (×2): 10 mg via INTRAVENOUS

## 2020-08-12 MED ORDER — LACTATED RINGERS IV SOLN: INTRAVENOUS | Status: DC

## 2020-08-12 MED ORDER — CHLORHEXIDINE GLUCONATE CLOTH 2 % EX PADS: 6.0000 | MEDICATED_PAD | Freq: Once | CUTANEOUS | Status: DC

## 2020-08-12 MED ORDER — VASOPRESSIN 20 UNIT/ML IV SOLN
INTRAVENOUS | Status: AC
  Filled 2020-08-12: qty 1

## 2020-08-12 MED ORDER — ACETAMINOPHEN 500 MG PO TABS
1000.0000 mg | ORAL_TABLET | ORAL | Status: AC
  Administered 2020-08-12: 1000 mg via ORAL
  Filled 2020-08-12: qty 2

## 2020-08-12 MED ORDER — CEFAZOLIN IN SODIUM CHLORIDE 3-0.9 GM/100ML-% IV SOLN
3.0000 g | INTRAVENOUS | Status: AC
  Administered 2020-08-12: 3 g via INTRAVENOUS
  Filled 2020-08-12: qty 100

## 2020-08-12 MED ORDER — ORAL CARE MOUTH RINSE: 15.0000 mL | Freq: Once | OROMUCOSAL | Status: AC

## 2020-08-12 MED ORDER — SUCCINYLCHOLINE CHLORIDE 200 MG/10ML IV SOSY
PREFILLED_SYRINGE | INTRAVENOUS | Status: AC
  Filled 2020-08-12: qty 10

## 2020-08-12 MED ORDER — LACTATED RINGERS IV SOLN: INTRAVENOUS | Status: DC | PRN

## 2020-08-12 MED ORDER — PHENYLEPHRINE 40 MCG/ML (10ML) SYRINGE FOR IV PUSH (FOR BLOOD PRESSURE SUPPORT)
PREFILLED_SYRINGE | INTRAVENOUS | Status: DC | PRN
  Administered 2020-08-12 (×2): 80 ug via INTRAVENOUS
  Administered 2020-08-12 (×2): 120 ug via INTRAVENOUS

## 2020-08-12 MED ORDER — BUPIVACAINE-EPINEPHRINE (PF) 0.25% -1:200000 IJ SOLN
INTRAMUSCULAR | Status: AC
  Filled 2020-08-12: qty 30

## 2020-08-12 MED ORDER — GLYCOPYRROLATE PF 0.2 MG/ML IJ SOSY
PREFILLED_SYRINGE | INTRAMUSCULAR | Status: DC | PRN
  Administered 2020-08-12: .2 mg via INTRAVENOUS

## 2020-08-12 MED ORDER — ENSURE PRE-SURGERY PO LIQD: 296.0000 mL | Freq: Once | ORAL | Status: DC

## 2020-08-12 MED ORDER — LIDOCAINE 2% (20 MG/ML) 5 ML SYRINGE
INTRAMUSCULAR | Status: AC
  Filled 2020-08-12: qty 5

## 2020-08-12 MED ORDER — OXYCODONE HCL 5 MG PO TABS: 5.0000 mg | ORAL_TABLET | Freq: Four times a day (QID) | ORAL | 0 refills | Status: DC | PRN

## 2020-08-12 SURGICAL SUPPLY — 33 items
BAG COUNTER SPONGE SURGICOUNT (BAG) ×2 IMPLANT
BLADE CLIPPER SURG (BLADE) ×2 IMPLANT
CANISTER SUCT 3000ML PPV (MISCELLANEOUS) ×2 IMPLANT
CHLORAPREP W/TINT 26 (MISCELLANEOUS) ×2 IMPLANT
COVER SURGICAL LIGHT HANDLE (MISCELLANEOUS) ×2 IMPLANT
DERMABOND ADHESIVE PROPEN (GAUZE/BANDAGES/DRESSINGS) ×1
DERMABOND ADVANCED (GAUZE/BANDAGES/DRESSINGS) ×1
DERMABOND ADVANCED .7 DNX12 (GAUZE/BANDAGES/DRESSINGS) ×1 IMPLANT
DERMABOND ADVANCED .7 DNX6 (GAUZE/BANDAGES/DRESSINGS) ×1 IMPLANT
DRAPE LAPAROSCOPIC ABDOMINAL (DRAPES) ×2 IMPLANT
ELECT REM PT RETURN 9FT ADLT (ELECTROSURGICAL) ×2
ELECTRODE REM PT RTRN 9FT ADLT (ELECTROSURGICAL) ×1 IMPLANT
GLOVE SURG SIGNA 7.5 PF LTX (GLOVE) ×2 IMPLANT
GOWN STRL REUS W/ TWL LRG LVL3 (GOWN DISPOSABLE) ×1 IMPLANT
GOWN STRL REUS W/ TWL XL LVL3 (GOWN DISPOSABLE) ×1 IMPLANT
GOWN STRL REUS W/TWL LRG LVL3 (GOWN DISPOSABLE) ×2
GOWN STRL REUS W/TWL XL LVL3 (GOWN DISPOSABLE) ×2
KIT BASIN OR (CUSTOM PROCEDURE TRAY) ×2 IMPLANT
KIT TURNOVER KIT B (KITS) ×2 IMPLANT
MESH VENTRALEX ST 8CM LRG (Mesh General) ×2 IMPLANT
NEEDLE HYPO 25GX1X1/2 BEV (NEEDLE) ×2 IMPLANT
NS IRRIG 1000ML POUR BTL (IV SOLUTION) ×2 IMPLANT
PACK GENERAL/GYN (CUSTOM PROCEDURE TRAY) ×2 IMPLANT
PAD ARMBOARD 7.5X6 YLW CONV (MISCELLANEOUS) ×2 IMPLANT
PENCIL SMOKE EVACUATOR (MISCELLANEOUS) ×2 IMPLANT
SUT NOVA NAB DX-16 0-1 5-0 T12 (SUTURE) ×4 IMPLANT
SUT PROLENE 1 CT (SUTURE) IMPLANT
SUT VIC AB 3-0 SH 27 (SUTURE)
SUT VIC AB 3-0 SH 27XBRD (SUTURE) IMPLANT
SYR CONTROL 10ML LL (SYRINGE) IMPLANT
TOWEL GREEN STERILE (TOWEL DISPOSABLE) ×2 IMPLANT
TOWEL GREEN STERILE FF (TOWEL DISPOSABLE) ×2 IMPLANT
TRAY FOLEY MTR SLVR 14FR STAT (SET/KITS/TRAYS/PACK) IMPLANT

## 2020-08-12 NOTE — Transfer of Care (Signed)
Immediate Anesthesia Transfer of Care Note  Patient: Craig Lindsey  Procedure(s) Performed: HERNIA REPAIR INCISIONAL with mesh  Patient Location: PACU  Anesthesia Type:General  Level of Consciousness: awake, oriented and patient cooperative  Airway & Oxygen Therapy: Patient Spontanous Breathing and Patient connected to face mask oxygen  Post-op Assessment: Report given to RN and Post -op Vital signs reviewed and stable  Post vital signs: Reviewed  Last Vitals:  Vitals Value Taken Time  BP 97/38 08/12/20 0839  Temp 36.7 C 08/12/20 0839  Pulse 66 08/12/20 0842  Resp 15 08/12/20 0842  SpO2 100 % 08/12/20 0842  Vitals shown include unvalidated device data.  Last Pain:  Vitals:   08/12/20 0622  TempSrc:   PainSc: 0-No pain         Complications: No notable events documented.

## 2020-08-12 NOTE — Anesthesia Postprocedure Evaluation (Signed)
Anesthesia Post Note  Patient: Craig Lindsey  Procedure(s) Performed: HERNIA REPAIR INCISIONAL with mesh     Patient location during evaluation: PACU Anesthesia Type: General Level of consciousness: awake Pain management: pain level controlled Vital Signs Assessment: post-procedure vital signs reviewed and stable Respiratory status: spontaneous breathing, nonlabored ventilation, respiratory function stable and patient connected to nasal cannula oxygen Cardiovascular status: blood pressure returned to baseline and stable Postop Assessment: no apparent nausea or vomiting Anesthetic complications: no   No notable events documented.  Last Vitals:  Vitals:   08/12/20 0909 08/12/20 0920  BP: (!) 128/50 (!) 127/52  Pulse:    Resp: 18 16  Temp:    SpO2: 99%     Last Pain:  Vitals:   08/12/20 0909  TempSrc:   PainSc: 0-No pain                 Shahid Flori P Clairissa Valvano

## 2020-08-12 NOTE — Discharge Instructions (Signed)
[facility] _______[facility]  UMBILICAL OR INGUINAL HERNIA REPAIR: POST OP INSTRUCTIONS  Always review your discharge instruction sheet given to you by the facility where your surgery was performed. IF YOU HAVE DISABILITY OR FAMILY LEAVE FORMS, YOU MUST BRING THEM TO THE OFFICE FOR PROCESSING.   DO NOT GIVE THEM TO YOUR DOCTOR.  1. A  prescription for pain medication may be given to you upon discharge.  Take your pain medication as prescribed, if needed.  If narcotic pain medicine is not needed, then you may take acetaminophen (Tylenol) or ibuprofen (Advil) as needed. 2. Take your usually prescribed medications unless otherwise directed. If you need a refill on your pain medication, please contact your pharmacy.  They will contact our office to request authorization. Prescriptions will not be filled after 5 pm or on week-ends. 3. You should follow a light diet the first 24 hours after arrival home, such as soup and crackers, etc.  Be sure to include lots of fluids daily.  Resume your normal diet the day after surgery. 4.Most patients will experience some swelling and bruising around the umbilicus or in the groin and scrotum.  Ice packs and reclining will help.  Swelling and bruising can take several days to resolve.  6. It is common to experience some constipation if taking pain medication after surgery.  Increasing fluid intake and taking a stool softener (such as Colace) will usually help or prevent this problem from occurring.  A mild laxative (Milk of Magnesia or Miralax) should be taken according to package directions if there are no bowel movements after 48 hours. 7. Unless discharge instructions indicate otherwise, you may remove your bandages 24-48 hours after surgery, and you may shower at that time.  You may have steri-strips (small skin tapes) in place directly over the incision.  These strips should be left on the skin for 7-10 days.  If your surgeon used skin glue on the  incision, you may shower in 24 hours.  The glue will flake off over the next 2-3 weeks.  Any sutures or staples will be removed at the office during your follow-up visit. 8. ACTIVITIES:  You may resume regular (light) daily activities beginning the next day--such as daily self-care, walking, climbing stairs--gradually increasing activities as tolerated.  You may have sexual intercourse when it is comfortable.  Refrain from any heavy lifting or straining until approved by your doctor.  a.You may drive when you are no longer taking prescription pain medication, you can comfortably wear a seatbelt, and you can safely maneuver your car and apply brakes. b.RETURN TO WORK:   _____________________________________________  9.You should see your doctor in the office for a follow-up appointment approximately 2-3 weeks after your surgery.  Make sure that you call for this appointment within a day or two after you arrive home to insure a convenient appointment time. 10.OTHER INSTRUCTIONS: ___OK TO SHOWER STARTING TOMORROW ICE PACK AND TYLENOL ALSO FOR PAIN NO LIFTING MORE THAN 15 TO 20 POUNDS FOR 6 WEEKS OK TO RESUME BLOOD THINNING MEDICINE THIS EVENING______________________    _____________________________________  WHEN TO CALL YOUR DOCTOR: Fever over 101.0 Inability to urinate Nausea and/or vomiting Extreme swelling or bruising Continued bleeding from incision. Increased pain, redness, or drainage from the incision  The clinic staff is available to answer your questions during regular business hours.  Please don't hesitate to call and ask to speak to one of the nurses for clinical concerns.  If you have a medical emergency, go  to the nearest emergency room or call 911.  A surgeon from [facility] is always on call at the hospital   [address], [place], [place], [place]  [address] ?  [address], [place], [place]   [address] [phone] ? [phone] ? [phone]  [phone] Web site: [email or web address]

## 2020-08-12 NOTE — Op Note (Signed)
Operative Report  Indications: Symptomatic incisional hernia  Pre-operative Diagnosis: Incisional hernia  Post-operative Diagnosis: Incisional hernia  Surgeon: Coralie Keens, MD  Assistants: Lucas Mallow, MD MPH - resident, assisting  Anesthesia: General endotracheal anesthesia and Local anesthesia 0.5% bupivacaine, with epinephrine  ASA Class: 3  Procedure Details  The patient was seen in the Holding Room. The risks, benefits, complications, treatment options, and expected outcomes were discussed with the patient. The possibilities of reaction to medication, pulmonary aspiration, perforation of viscus, bleeding, recurrent infection, the need for additional procedures, failure to diagnose a condition, and creating a complication requiring transfusion or operation were discussed with the patient. The patient concurred with the proposed plan, giving informed consent.  The site of surgery properly noted/marked. The patient was taken to the operating room, identified as Craig Lindsey and the procedure verified as ventral hernia repair. A Time Out was held and the above information confirmed.  The patient was placed supine.  After establishing general anesthesia, the abdomen was prepped with Chloraprep and draped in sterile fashion.  We made a vertical incision over the palpable hernia. Dissection was carried down to the hernia sac located above the fascia and mobilized from surrounding structures.  Intact fascia was identified circumferentially around the defect, which we measured as 4cm in the largest dimension (vertically). We used an 8cm Ventralex ST mesh with strap and secured this to the fascia with interrupted 0 Novofil sutures.  The fascial defect was reapproximated with interrupted figure-of-8 Novofil sutures.  The subcutaneous tissues were irrigated. We closed the suprafascial defect with interrupted 2-0 Vicryl sutures. The skin incision was closed with a 4-0 Monocryl subcuticular  closure and Dermabond. Hemostasis was confirmed.   Instrument, sponge, and needle counts were correct prior to closure and at the conclusion of the case.   Findings: Small, ventral hernia just to the right of midline.  Estimated Blood Loss:  Minimal         Drains: none                      Complications:  None; patient tolerated the procedure well.         Disposition: PACU - hemodynamically stable.         Condition: stable  Lucas Mallow, MD MPH Resident, [place]

## 2020-08-12 NOTE — Interval H&P Note (Signed)
History and Physical Interval Note: no change in H and P  08/12/2020 7:01 AM  Craig Lindsey  has presented today for surgery, with the diagnosis of incisional hernia.  The various methods of treatment have been discussed with the patient and family. After consideration of risks, benefits and other options for treatment, the patient has consented to  Procedure(s) with comments: [place] with mesh (N/A) - LMA as a surgical intervention.  The patient's history has been reviewed, patient examined, no change in status, stable for surgery.  I have reviewed the patient's chart and labs.  Questions were answered to the patient's satisfaction.     Coralie Keens

## 2020-08-12 NOTE — Anesthesia Procedure Notes (Signed)
Procedure Name: Intubation Date/Time: 08/12/2020 7:33 AM Performed by: Jenne Campus, CRNA Pre-anesthesia Checklist: Patient identified, Emergency Drugs available, Suction available and Patient being monitored Patient Re-evaluated:Patient Re-evaluated prior to induction Oxygen Delivery Method: Circle System Utilized Preoxygenation: Pre-oxygenation with 100% oxygen Induction Type: IV induction Ventilation: Mask ventilation without difficulty Laryngoscope Size: Miller and 3 Grade View: Grade I Tube type: Oral Number of attempts: 1 Airway Equipment and Method: Stylet and Oral airway Placement Confirmation: ETT inserted through vocal cords under direct vision, positive ETCO2 and breath sounds checked- equal and bilateral Secured at: 23 cm Tube secured with: Tape Dental Injury: Teeth and Oropharynx as per pre-operative assessment

## 2020-08-13 ENCOUNTER — Encounter (HOSPITAL_COMMUNITY): Payer: Self-pay | Admitting: Surgery

## 2020-09-30 ENCOUNTER — Telehealth: Payer: Self-pay | Admitting: Internal Medicine

## 2020-09-30 MED ORDER — MOLNUPIRAVIR 200 MG PO CAPS: 4.0000 | ORAL_CAPSULE | Freq: Two times a day (BID) | ORAL | 0 refills | Status: DC

## 2020-09-30 NOTE — Telephone Encounter (Signed)
Tried to call pt to find out when symptoms started. His VM is full so I could not leave a message.

## 2020-09-30 NOTE — Telephone Encounter (Signed)
Discussed his situation 2 days of symptoms No SOB and not too bad at this point  Discussed EUA He wishes to proceed Will use molnupiravir due to CKD and all his meds Asked him to stop if he has any significant side effects (most commonly GI)

## 2020-09-30 NOTE — Addendum Note (Signed)
Addended by: Viviana Simpler I on: 09/30/2020 03:25 PM   Modules accepted: Orders

## 2020-09-30 NOTE — Telephone Encounter (Signed)
Started 2 days ago with a sore throat. Has a slight cough, runny nose and hoarse. No fever. Taking Mucinex DM. [facility] [place]. I duscussed the anti-virals being EUA and the risk of rebound Covid. Pt would still like to move forward with an antiviral if Dr [name] approves.

## 2020-09-30 NOTE — Telephone Encounter (Signed)
Going ahead with the treatment

## 2020-10-05 DIAGNOSIS — G4733 Obstructive sleep apnea (adult) (pediatric): Secondary | ICD-10-CM | POA: Diagnosis not present

## 2020-10-11 ENCOUNTER — Ambulatory Visit (INDEPENDENT_AMBULATORY_CARE_PROVIDER_SITE_OTHER): Payer: Medicare HMO

## 2020-10-11 DIAGNOSIS — I480 Paroxysmal atrial fibrillation: Secondary | ICD-10-CM

## 2020-10-11 LAB — CUP PACEART REMOTE DEVICE CHECK
Battery Remaining Longevity: 100 mo
Battery Voltage: 3 V
Brady Statistic RV Percent Paced: 10.98 %
Date Time Interrogation Session: 20220926012206
HighPow Impedance: 51 Ohm
HighPow Impedance: 71 Ohm
Implantable Lead Implant Date: 20050113
Implantable Lead Location: 753860
Implantable Lead Model: 6949
Implantable Pulse Generator Implant Date: 20190328
Lead Channel Impedance Value: 456 Ohm
Lead Channel Impedance Value: 608 Ohm
Lead Channel Pacing Threshold Amplitude: 1 V
Lead Channel Pacing Threshold Pulse Width: 0.4 ms
Lead Channel Sensing Intrinsic Amplitude: 9.75 mV
Lead Channel Sensing Intrinsic Amplitude: 9.75 mV
Lead Channel Setting Pacing Amplitude: 2.5 V
Lead Channel Setting Pacing Pulse Width: 0.4 ms
Lead Channel Setting Sensing Sensitivity: 0.3 mV

## 2020-10-12 ENCOUNTER — Telehealth: Payer: Self-pay

## 2020-10-12 NOTE — Telephone Encounter (Signed)
-----   Message from York Ram, RN sent at 10/12/2020  9:44 AM EDT ----- Dr.Taylor sent his to me yesterday.  I can't call him from [place], if I have a few minutes this after I can try to call him if you haven't already.   ----- Message ----- From: Evans Lance, MD Sent: 10/11/2020   8:54 PM EDT To: York Ram, RN  Remote device check reviewed. Histograms appropriate. Leads and battery stable for patient. Follow up as outlined above. No recommended changes. Optivol up. Call him to see how he is doing. If more sob, take an extra lasix for 3 days.

## 2020-10-12 NOTE — Telephone Encounter (Signed)
Patient called and reports feeling fine, denies swelling in legs or ankles. Denies shortness of breath. Reports he has been on a diet and drinking more water. Patient understands to call if any symptoms arise. Will continue to monitor.

## 2020-10-18 NOTE — Progress Notes (Signed)
Remote ICD transmission.   

## 2020-10-21 DIAGNOSIS — N281 Cyst of kidney, acquired: Secondary | ICD-10-CM | POA: Diagnosis not present

## 2020-10-21 DIAGNOSIS — I10 Essential (primary) hypertension: Secondary | ICD-10-CM | POA: Diagnosis not present

## 2020-10-21 DIAGNOSIS — N1832 Chronic kidney disease, stage 3b: Secondary | ICD-10-CM | POA: Diagnosis not present

## 2020-10-21 DIAGNOSIS — E1122 Type 2 diabetes mellitus with diabetic chronic kidney disease: Secondary | ICD-10-CM | POA: Diagnosis not present

## 2020-10-31 ENCOUNTER — Other Ambulatory Visit: Payer: Self-pay | Admitting: Internal Medicine

## 2020-11-02 ENCOUNTER — Other Ambulatory Visit: Payer: Self-pay | Admitting: Nephrology

## 2020-11-02 DIAGNOSIS — N281 Cyst of kidney, acquired: Secondary | ICD-10-CM

## 2020-11-09 ENCOUNTER — Other Ambulatory Visit: Payer: Self-pay | Admitting: Internal Medicine

## 2020-11-18 ENCOUNTER — Ambulatory Visit (INDEPENDENT_AMBULATORY_CARE_PROVIDER_SITE_OTHER): Payer: Medicare HMO | Admitting: Internal Medicine

## 2020-11-18 ENCOUNTER — Encounter: Payer: Self-pay | Admitting: Internal Medicine

## 2020-11-18 ENCOUNTER — Other Ambulatory Visit: Payer: Self-pay

## 2020-11-18 VITALS — BP 130/76 | HR 59 | Temp 96.9°F | Ht 73.0 in | Wt 302.0 lb

## 2020-11-18 DIAGNOSIS — I5032 Chronic diastolic (congestive) heart failure: Secondary | ICD-10-CM | POA: Diagnosis not present

## 2020-11-18 DIAGNOSIS — E1140 Type 2 diabetes mellitus with diabetic neuropathy, unspecified: Secondary | ICD-10-CM | POA: Diagnosis not present

## 2020-11-18 DIAGNOSIS — Z23 Encounter for immunization: Secondary | ICD-10-CM | POA: Diagnosis not present

## 2020-11-18 LAB — POCT GLYCOSYLATED HEMOGLOBIN (HGB A1C): Hemoglobin A1C: 4.9 % (ref 4.0–5.6)

## 2020-11-18 NOTE — Assessment & Plan Note (Signed)
Lab Results  Component Value Date   HGBA1C 5.1 08/03/2020   Remarkable improvement---and down to 4.9% today Asked him to stay on ozempic--but will stop the glipizide to reduce hypoglycemic risk

## 2020-11-18 NOTE — Assessment & Plan Note (Signed)
Compensated On lisinopril, furosemide, metoprolol daily

## 2020-11-18 NOTE — Progress Notes (Signed)
Subjective:    Patient ID: Craig Lindsey, male    DOB: 09/01/1960, 60 y.o.   MRN: [id number]  HPI Here for follow up of diabetes and other chronic health conditions This visit occurred during the SARS-CoV-2 public health emergency.  Safety protocols were in place, including screening questions prior to the visit, additional usage of staff PPE, and extensive cleaning of exam room while observing appropriate contact time as indicated for disinfecting solutions.   Uses the ozempic for the first 2 months Then price went over $200--so stopped it Now has restarted it last week---0.5mg   Ongoing weight loss he attributes to [employer] Powdered supplements--then 1 meal (meat and vegetable) Has lost 70# in past year----50# /6 months (on this diet)  Doing some exercise Knee pain is better Plans to restart the gym--Silver Sneakers  Not checking sugars much now No hypoglycemic reactions  Current Outpatient Medications on File Prior to Visit  Medication Sig Dispense Refill   ACCU-CHEK FASTCLIX LANCETS MISC Use to check blood sugar three times a day.  Dx: E11.40 306 each 3   ACCU-CHEK SMARTVIEW test strip USE AS INSTRUCTED TO TEST BLOOD SUGAR THREE TIMES DAILY 300 each 12   acetaminophen (TYLENOL) 500 MG tablet Take 1,000 mg by mouth every 6 (six) hours as needed (pain).     amiodarone (PACERONE) 200 MG tablet TAKE 1 TABLET (200 MG TOTAL) BY MOUTH DAILY. (Patient taking differently: Take 200 mg by mouth in the morning.) 90 tablet 3   atorvastatin (LIPITOR) 10 MG tablet TAKE 1 TABLET (10 MG TOTAL) BY MOUTH DAILY AT 6 PM. 90 tablet 3   Blood Pressure Monitor DEVI      diltiazem (DILACOR XR) 240 MG 24 hr capsule TAKE 1 CAPSULE EVERY MORNING 90 capsule 3   furosemide (LASIX) 40 MG tablet TAKE 1 TABLET EVERY DAY (Patient taking differently: Take 40 mg by mouth in the morning.) 90 tablet 3   glipiZIDE (GLUCOTROL XL) 2.5 MG 24 hr tablet TAKE 1 TABLET DAILY WITH BREAKFAST. (Patient taking  differently: Take 2.5 mg by mouth in the morning.) 90 tablet 3   lisinopril (ZESTRIL) 40 MG tablet TAKE 1 TABLET (40 MG TOTAL) BY MOUTH DAILY. 90 tablet 3   metoprolol succinate (TOPROL-XL) 100 MG 24 hr tablet TAKE 2 TABLETS (200 MG TOTAL) BY MOUTH DAILY. (Patient taking differently: Take 100 mg by mouth in the morning and at bedtime.) 180 tablet 3   Multiple Vitamins-Minerals (BARIATRIC MULTIVITAMINS/IRON PO) Take 1 tablet by mouth in the morning.     Semaglutide,0.25 or 0.5MG /DOS, (OZEMPIC, 0.25 OR 0.5 MG/DOSE,) 2 MG/1.5ML SOPN Inject 0.5 mg into the skin once a week.     XARELTO 20 MG TABS tablet TAKE 1 TABLET EVERY DAY WITH SUPPER (Patient taking differently: Take 20 mg by mouth every evening.) 90 tablet 3   No current facility-administered medications on file prior to visit.    Not on File  Past Medical History:  Diagnosis Date   AICD (automatic cardioverter/defibrillator) present    Arrhythmia 12/2002   1. Ventricular tachycardia; Dr. [name]  2. Atrial fibrillation   Arthritis    knees   Cancer (HCC)    Chronic kidney disease    Complex L renal cyst, no tx for    Diabetes mellitus    Type II   DVT (deep venous thrombosis) ([place]) 07/2007   RLE, Tx Arixtra   Dysrhythmia    v fib and a fib has icd   Hemorrhoids  hx of   Hyperlipidemia    Hypertension    Neuropathy    Oxaliplatin  from cancer treatment   Obesity    Personal history of colon cancer, stage III 01/2007   Dr. Carron Brazen, D. [place]   Sigmoid colon ulcer 5 yrs ago   Sigmoid; DLucia Gaskins, MD (colon cancer)   Sleep apnea    uses cpap setting of 54.6   Umbilical hernia     Past Surgical History:  Procedure Laterality Date   CARDIAC DEFIBRILLATOR PLACEMENT     2004   CHOLECYSTECTOMY     COLECTOMY  1/09   Sigmoid; Nydia Bouton, MD (colon cancer)   COLONOSCOPY     COLONOSCOPY N/A 02/25/2013   Procedure: COLONOSCOPY;  Surgeon: Gatha Mayer, MD;  Location: [facility];  Service: Endoscopy;  Laterality:  N/A;   HERNIA REPAIR  yrs ago   umb hernia   ICD GENERATOR CHANGEOUT N/A 04/12/2017   Procedure: [place];  Surgeon: Evans Lance, MD;  Location: [facility];  Service: Cardiovascular;  Laterality: N/A;   INCISIONAL HERNIA REPAIR N/A 08/12/2020   Procedure: HERNIA REPAIR INCISIONAL with mesh;  Surgeon: Coralie Keens, MD;  Location: [facility] OR;  Service: General;  Laterality: N/A;  LMA   LAPAROSCOPIC GASTRIC SLEEVE RESECTION     Dr. Lucia Gaskins 04-30-17   LAPAROSCOPIC GASTRIC SLEEVE RESECTION N/A 04/30/2017   Procedure: LAPAROSCOPIC GASTRIC SLEEVE RESECTION WITH UPPER ENDO;  Surgeon: Alphonsa Overall, MD;  Location: [facility] OR[facility];  Service: General;  Laterality: N/A;    Family History  Problem Relation Age of Onset   Diabetes Father    Cancer Father        myelofibrosis   Colon cancer Other    Diabetes Maternal Grandmother    Cancer Maternal Aunt        colon   Hypertension Neg Hx    Coronary artery disease Neg Hx    Prostate cancer Neg Hx    Esophageal cancer Neg Hx    Rectal cancer Neg Hx    Stomach cancer Neg Hx     Social History   Socioeconomic History   Marital status: Married    Spouse name: Not on file   Number of children: 2   Years of education: Not on file   Highest education level: Not on file  Occupational History   Occupation: Scientist, water quality and block sales    Comment: Disabled  Tobacco Use   Smoking status: Never   Smokeless tobacco: Never  Vaping Use   Vaping Use: Never used  Substance and Sexual Activity   Alcohol use: No    Alcohol/week: 0.0 standard drinks   Drug use: No   Sexual activity: Yes  Other Topics Concern   Not on file  Social History Narrative   2 step children from wife's 2nd marriage.      Has living will   Wife is health care POA---alternate would be sister, Vickie Epley   Would accept resuscitation attempts   Not sure about tube feedings   Social Determinants of Health   Financial Resource Strain: Not on file  Food  Insecurity: Not on file  Transportation Needs: Not on file  Physical Activity: Not on file  Stress: Not on file  Social Connections: Not on file  Intimate Partner Violence: Not on file   Review of Systems No chest pain  No SOB Sleeps okay Did have ventral hernia repair     Objective:   Physical Exam  Cardiovascular:     Rate and Rhythm: Normal rate and regular rhythm.     Pulses: Normal pulses.     Heart sounds: No murmur heard.   No gallop.  Pulmonary:     Effort: Pulmonary effort is normal.     Breath sounds: Normal breath sounds. No wheezing or rales.  Musculoskeletal:     Cervical back: Neck supple.     Right lower leg: No edema.     Left lower leg: No edema.  Lymphadenopathy:     Cervical: No cervical adenopathy.  Skin:    Comments: No foot lesions  Psychiatric:        Mood and Affect: Mood normal.        Behavior: Behavior normal.           Assessment & Plan:

## 2020-11-18 NOTE — Addendum Note (Signed)
Addended by: Pilar Grammes on: 11/18/2020 02:39 PM   Modules accepted: Orders

## 2020-11-18 NOTE — Assessment & Plan Note (Signed)
Has lost 50# in the past 6 months---mostly due to the [place] I want him to stay on ozempic as monotherapy for diabetes and to help prevent gaining back the weight

## 2020-11-23 ENCOUNTER — Ambulatory Visit
Admission: RE | Admit: 2020-11-23 | Discharge: 2020-11-23 | Disposition: A | Discharge status: Home / Self Care | Payer: Medicare HMO | Source: Ambulatory Visit | Attending: Nephrology | Admitting: Nephrology

## 2020-11-23 ENCOUNTER — Other Ambulatory Visit: Payer: Self-pay

## 2020-11-23 DIAGNOSIS — M47816 Spondylosis without myelopathy or radiculopathy, lumbar region: Secondary | ICD-10-CM | POA: Diagnosis not present

## 2020-11-23 DIAGNOSIS — D3500 Benign neoplasm of unspecified adrenal gland: Secondary | ICD-10-CM | POA: Diagnosis not present

## 2020-11-23 DIAGNOSIS — R222 Localized swelling, mass and lump, trunk: Secondary | ICD-10-CM | POA: Diagnosis not present

## 2020-11-23 DIAGNOSIS — N281 Cyst of kidney, acquired: Secondary | ICD-10-CM | POA: Diagnosis not present

## 2020-11-26 ENCOUNTER — Encounter (HOSPITAL_COMMUNITY): Payer: Self-pay | Admitting: *Deleted

## 2020-12-01 NOTE — Telephone Encounter (Signed)
Form placed in Dr Letvak's inbox on his desk 

## 2020-12-05 ENCOUNTER — Other Ambulatory Visit: Payer: Self-pay | Admitting: Internal Medicine

## 2020-12-15 ENCOUNTER — Other Ambulatory Visit: Payer: Self-pay | Admitting: Internal Medicine

## 2020-12-28 ENCOUNTER — Telehealth: Payer: Self-pay

## 2020-12-28 MED ORDER — OZEMPIC (0.25 OR 0.5 MG/DOSE) 2 MG/1.5ML ~~LOC~~ SOPN: 0.5000 mg | PEN_INJECTOR | SUBCUTANEOUS | 4 refills | Status: DC

## 2020-12-28 NOTE — Telephone Encounter (Signed)
Rx sent electronically.  

## 2021-01-11 ENCOUNTER — Ambulatory Visit (INDEPENDENT_AMBULATORY_CARE_PROVIDER_SITE_OTHER): Payer: Medicare HMO

## 2021-01-11 DIAGNOSIS — I4901 Ventricular fibrillation: Secondary | ICD-10-CM

## 2021-01-11 LAB — CUP PACEART REMOTE DEVICE CHECK
Battery Remaining Longevity: 95 mo
Battery Voltage: 3 V
Brady Statistic RV Percent Paced: 0.05 %
Date Time Interrogation Session: 20221227054348
HighPow Impedance: 49 Ohm
HighPow Impedance: 63 Ohm
Implantable Lead Implant Date: 20050113
Implantable Lead Location: 753860
Implantable Lead Model: 6949
Implantable Pulse Generator Implant Date: 20190328
Lead Channel Impedance Value: 456 Ohm
Lead Channel Impedance Value: 589 Ohm
Lead Channel Pacing Threshold Amplitude: 1 V
Lead Channel Pacing Threshold Pulse Width: 0.4 ms
Lead Channel Sensing Intrinsic Amplitude: 8.625 mV
Lead Channel Sensing Intrinsic Amplitude: 8.625 mV
Lead Channel Setting Pacing Amplitude: 2.5 V
Lead Channel Setting Pacing Pulse Width: 0.4 ms
Lead Channel Setting Sensing Sensitivity: 0.3 mV

## 2021-01-13 ENCOUNTER — Telehealth: Payer: Self-pay

## 2021-01-13 NOTE — Telephone Encounter (Signed)
Successful telephone encounter to patient advising to take an additional 40mg  of lasix for the next 4 days per Dr. Tanna Furry recommendation. Patient states he has been on a diet for the last 7 months and drinking at least 100 oz of water a day. Educated patient on diastolic HF and causes of volume overload including excessive intake of fluid. Strongly encouraged patient to significantly decrease daily fluid volume intake and drink when he is thirsty. Patient unaware that he has retained fluid. Denies increased shortness of breath or extremity/abdominal edema. Patient weighs daily however looking for a reduction in "fat weight" and not an increase in fluid weight. Patient able to repeat provided medication instructions. He will call back if he feels his output does not increase.

## 2021-01-20 NOTE — Progress Notes (Signed)
Remote ICD transmission.   

## 2021-02-10 DIAGNOSIS — N1831 Chronic kidney disease, stage 3a: Secondary | ICD-10-CM | POA: Diagnosis not present

## 2021-02-10 DIAGNOSIS — I10 Essential (primary) hypertension: Secondary | ICD-10-CM | POA: Diagnosis not present

## 2021-02-10 DIAGNOSIS — N281 Cyst of kidney, acquired: Secondary | ICD-10-CM | POA: Diagnosis not present

## 2021-02-10 DIAGNOSIS — E1122 Type 2 diabetes mellitus with diabetic chronic kidney disease: Secondary | ICD-10-CM | POA: Diagnosis not present

## 2021-02-10 DIAGNOSIS — N2581 Secondary hyperparathyroidism of renal origin: Secondary | ICD-10-CM | POA: Diagnosis not present

## 2021-02-28 ENCOUNTER — Other Ambulatory Visit: Payer: Self-pay | Admitting: Internal Medicine

## 2021-03-02 ENCOUNTER — Telehealth: Payer: Self-pay | Admitting: Internal Medicine

## 2021-03-02 NOTE — Chronic Care Management (AMB) (Signed)
°  Chronic Care Management   Note  03/02/2021 Name: Craig Lindsey MRN: [id number] DOB: February 11, 1960  Craig Lindsey is a 61 y.o. year old male who is a primary care patient of Venia Carbon, MD. I reached out to [name] by phone today in response to a referral sent by Craig Lindsey's PCP, Venia Carbon, MD.   Craig Lindsey was given information about Chronic Care Management services today including:  CCM service includes personalized support from designated clinical staff supervised by his physician, including individualized plan of care and coordination with other care providers 24/7 contact phone numbers for assistance for urgent and routine care needs. Service will only be billed when office clinical staff spend 20 minutes or more in a month to coordinate care. Only one practitioner may furnish and bill the service in a calendar month. The patient may stop CCM services at any time (effective at the end of the month) by phone call to the office staff.   Patient agreed to services and verbal consent obtained.   Follow up plan:   Tatjana Secretary/administrator

## 2021-03-23 ENCOUNTER — Telehealth: Payer: Self-pay

## 2021-03-23 NOTE — Chronic Care Management (AMB) (Signed)
? ? ?  Chronic Care Management ?Pharmacy Assistant  ? ?Name: Craig Lindsey  MRN: [id number] DOB: 11-25-1960 ? ?Craig Lindsey is an 61 y.o. year old male who presents for his initial CCM visit with the clinical pharmacist. ? ?Reason for Encounter: Initial Questions  ?  ?Conditions to be addressed/monitored: ?HTN, HLD, DMII, and CKD Stage 3a ? ?Recent office visits:  ?11/18/20-PCP-Richard Letvak,MD-Patient presented for follow up diabetes.using Ozempic 0.'5mg'$  weekly ? ?Recent consult visits:  ?02/10/21-Nephrology-Munsoor Lateef,MD-Patient presented for follow up kidney disease.Discussed CT scans,no medication changes ?10/21/20-Nephrology-Munsoor Lateef,MD-Patient presented for follow up kidney disease.Referral for CT abdomen. ? ? ?Hospital visits:  ?None in previous 6 months ? ?Medications: ?Outpatient Encounter Medications as of 03/23/2021  ?Medication Sig  ? ACCU-CHEK FASTCLIX LANCETS MISC Use to check blood sugar three times a day.  Dx: E11.40  ? ACCU-CHEK SMARTVIEW test strip USE AS INSTRUCTED TO TEST BLOOD SUGAR THREE TIMES DAILY  ? acetaminophen (TYLENOL) 500 MG tablet Take 1,000 mg by mouth every 6 (six) hours as needed (pain).  ? amiodarone (PACERONE) 200 MG tablet TAKE 1 TABLET EVERY DAY  ? atorvastatin (LIPITOR) 10 MG tablet TAKE 1 TABLET BY MOUTH DAILY AT 6 PM.  ? Blood Pressure Monitor DEVI   ? diltiazem (DILACOR XR) 240 MG 24 hr capsule TAKE 1 CAPSULE EVERY MORNING  ? furosemide (LASIX) 40 MG tablet TAKE 1 TABLET EVERY DAY (Patient taking differently: Take 40 mg by mouth in the morning.)  ? lisinopril (ZESTRIL) 40 MG tablet TAKE 1 TABLET (40 MG TOTAL) BY MOUTH DAILY.  ? metoprolol succinate (TOPROL-XL) 100 MG 24 hr tablet TAKE 2 TABLETS EVERY DAY  ? Multiple Vitamins-Minerals (BARIATRIC MULTIVITAMINS/IRON PO) Take 1 tablet by mouth in the morning.  ? OZEMPIC, 0.25 OR 0.5 MG/DOSE, 2 MG/1.5ML SOPN Inject 0.5 mg into the skin once a week.  ? XARELTO 20 MG TABS tablet TAKE 1 TABLET EVERY DAY WITH SUPPER (Patient  taking differently: Take 20 mg by mouth every evening.)  ? ?No facility-administered encounter medications on file as of 03/23/2021.  ? ?Lab Results  ?Component Value Date/Time  ? HGBA1C 4.9 11/18/2020 02:38 PM  ? HGBA1C 5.1 08/03/2020 10:32 AM  ? HGBA1C 6.4 05/17/2020 03:02 PM  ? MICROALBUR 1.3 02/19/2014 02:59 PM  ? MICROALBUR 25.1 (H) 09/13/2010 12:46 PM  ?  ? ?BP Readings from Last 3 Encounters:  ?11/18/20 130/76  ?08/12/20 (!) 127/52  ?08/03/20 (!) 145/67  ? ? ?Spoke with patient and reminded them to have all medications, supplements and any blood glucose and blood pressure readings available for review with pharmacist, at their telephone visit on 03/28/21 at 1:30pm.  ? ?Patient contacted to confirm telephone appointment with Charlene Brooke, Pharm D, on 03/28/21 at 1:30pm. ? ?Do you have any problems getting your medications? No ? ?What is your top health concern you would like to discuss at your upcoming visit? Patient reports no concerns ? ?Have you seen any other providers since your last visit with PCP? Yes  Nephrology,Cardiology ? ? ? ?Star Rating Drugs:  ?Medication:  Last Fill: Day Supply ?Atorvastatin '10mg'$  01/18/21  90 ?Lisinopril '40mg'$  11/03/20 90 ?Ozempic 0.'5mg'$  01/24/21  84 ? ?Care Gaps: ?Annual wellness visit in last year? Yes ?Most Recent BP reading:130/76  59-P 11/18/20 ? ?If Diabetic: ?Most recent A1C reading:4.9  11/18/20 ?Last eye exam / retinopathy screening:05/17/20 ?Last diabetic foot exam:2020 ? ?Charlene Brooke, CPP notified ? ?Mickel Schreur, CCMA ?Health concierge  ?[phone]  ?

## 2021-03-24 ENCOUNTER — Telehealth: Payer: Self-pay

## 2021-03-24 NOTE — Telephone Encounter (Signed)
Voicemail full.

## 2021-03-25 DIAGNOSIS — D751 Secondary polycythemia: Secondary | ICD-10-CM | POA: Insufficient documentation

## 2021-03-25 NOTE — Progress Notes (Unsigned)
[place]  [phone] [phone]  ID: [name] OB: February 15, 1960  MR#: [id number]  JME#:[id number]  Patient Care Team: Venia Carbon, MD as PCP - General Evans Lance, MD as PCP - Cardiology (Cardiology) Evans Lance, MD as PCP - Electrophysiology (Cardiology) Evans Lance, MD as Consulting Physician (Cardiology) Charlton Haws, [facility] as Pharmacist (Pharmacist)  CHIEF COMPLAINT: Polycythemia.  INTERVAL HISTORY: ***  REVIEW OF SYSTEMS:   ROS  As per HPI. Otherwise, a complete review of systems is negative.  PAST MEDICAL HISTORY: Past Medical History:  Diagnosis Date   AICD (automatic cardioverter/defibrillator) present    Arrhythmia 12/2002   1. Ventricular tachycardia; Dr. [name]  2. Atrial fibrillation   Arthritis    knees   Cancer (HCC)    Chronic kidney disease    Complex L renal cyst, no tx for    Diabetes mellitus    Type II   DVT (deep venous thrombosis) ([place]) 07/2007   RLE, Tx Arixtra   Dysrhythmia    v fib and a fib has icd   Hemorrhoids    hx of   Hyperlipidemia    Hypertension    Neuropathy    Oxaliplatin  from cancer treatment   Obesity    Personal history of colon cancer, stage III 01/2007   Dr. Carron Brazen, D. [place]   Sigmoid colon ulcer 5 yrs ago   Sigmoid; Nydia Bouton, MD (colon cancer)   Sleep apnea    uses cpap setting of 22.9   Umbilical hernia     PAST SURGICAL HISTORY: Past Surgical History:  Procedure Laterality Date   CARDIAC DEFIBRILLATOR PLACEMENT     2004   CHOLECYSTECTOMY     COLECTOMY  1/09   Sigmoid; Nydia Bouton, MD (colon cancer)   COLONOSCOPY     COLONOSCOPY N/A 02/25/2013   Procedure: COLONOSCOPY;  Surgeon: Gatha Mayer, MD;  Location: [facility];  Service: Endoscopy;  Laterality: N/A;   HERNIA REPAIR  yrs ago   umb hernia   ICD GENERATOR CHANGEOUT N/A 04/12/2017   Procedure: [place];  Surgeon: Evans Lance, MD;  Location: [facility];  Service: Cardiovascular;  Laterality: N/A;   INCISIONAL HERNIA REPAIR N/A 08/12/2020   Procedure: HERNIA REPAIR INCISIONAL with mesh;  Surgeon: Coralie Keens, MD;  Location: [facility] OR;  Service: General;  Laterality: N/A;  LMA   LAPAROSCOPIC GASTRIC SLEEVE RESECTION     Dr. Lucia Gaskins 04-30-17   LAPAROSCOPIC GASTRIC SLEEVE RESECTION N/A 04/30/2017   Procedure: LAPAROSCOPIC GASTRIC SLEEVE RESECTION WITH UPPER ENDO;  Surgeon: Alphonsa Overall, MD;  Location: [facility] ORS;  Service: General;  Laterality: N/A;    FAMILY HISTORY: Family History  Problem Relation Age of Onset   Diabetes Father    Cancer Father        myelofibrosis   Colon cancer Other    Diabetes Maternal Grandmother    Cancer Maternal Aunt        colon   Hypertension Neg Hx    Coronary artery disease Neg Hx    Prostate cancer Neg Hx    Esophageal cancer Neg Hx    Rectal cancer Neg Hx    Stomach cancer Neg Hx     ADVANCED DIRECTIVES (Y/N):  N  HEALTH MAINTENANCE: Social History   Tobacco Use   Smoking status: Never   Smokeless tobacco: Never  Vaping Use   Vaping Use: Never used  Substance Use Topics  Alcohol use: No    Alcohol/week: 0.0 standard drinks   Drug use: No     Colonoscopy:  PAP:  Bone density:  Lipid panel:  Not on File  Current Outpatient Medications  Medication Sig Dispense Refill   ACCU-CHEK FASTCLIX LANCETS MISC Use to check blood sugar three times a day.  Dx: E11.40 306 each 3   ACCU-CHEK SMARTVIEW test strip USE AS INSTRUCTED TO TEST BLOOD SUGAR THREE TIMES DAILY 300 each 12   acetaminophen (TYLENOL) 500 MG tablet Take 1,000 mg by mouth every 6 (six) hours as needed (pain).     amiodarone (PACERONE) 200 MG tablet TAKE 1 TABLET EVERY DAY 90 tablet 3   atorvastatin (LIPITOR) 10 MG tablet TAKE 1 TABLET BY MOUTH DAILY AT 6 PM. 90 tablet 3   Blood Pressure Monitor DEVI      diltiazem (DILACOR XR) 240 MG 24 hr capsule TAKE 1 CAPSULE EVERY MORNING 90 capsule 3   furosemide (LASIX) 40  MG tablet TAKE 1 TABLET EVERY DAY (Patient taking differently: Take 40 mg by mouth in the morning.) 90 tablet 3   lisinopril (ZESTRIL) 40 MG tablet TAKE 1 TABLET (40 MG TOTAL) BY MOUTH DAILY. 90 tablet 3   metoprolol succinate (TOPROL-XL) 100 MG 24 hr tablet TAKE 2 TABLETS EVERY DAY 180 tablet 3   Multiple Vitamins-Minerals (BARIATRIC MULTIVITAMINS/IRON PO) Take 1 tablet by mouth in the morning.     OZEMPIC, 0.25 OR 0.5 MG/DOSE, 2 MG/1.5ML SOPN Inject 0.5 mg into the skin once a week. 4.5 mL 4   XARELTO 20 MG TABS tablet TAKE 1 TABLET EVERY DAY WITH SUPPER (Patient taking differently: Take 20 mg by mouth every evening.) 90 tablet 3   No current facility-administered medications for this visit.    OBJECTIVE: There were no vitals filed for this visit.   There is no height or weight on file to calculate BMI.    ECOG FS:{CHL ONC [id number]  General: Well-developed, well-nourished, no acute distress. Eyes: Pink conjunctiva, anicteric sclera. HEENT: Normocephalic, moist mucous membranes. Lungs: No audible wheezing or coughing. Heart: Regular rate and rhythm. Abdomen: Soft, nontender, no obvious distention. Musculoskeletal: No edema, cyanosis, or clubbing. Neuro: Alert, answering all questions appropriately. Cranial nerves grossly intact. Skin: No rashes or petechiae noted. Psych: Normal affect. Lymphatics: No cervical, calvicular, axillary or inguinal LAD.   LAB RESULTS:  Lab Results  Component Value Date   NA 137 08/12/2020   K 4.1 08/12/2020   CL 103 08/12/2020   CO2 26 08/12/2020   GLUCOSE 118 (H) 08/12/2020   BUN 41 (H) 08/12/2020   CREATININE 1.62 (H) 08/12/2020   CALCIUM 8.9 08/12/2020   PROT 7.6 05/17/2020   ALBUMIN 4.4 05/17/2020   ALBUMIN 4.4 05/17/2020   AST 17 05/17/2020   ALT 30 05/17/2020   ALKPHOS 56 05/17/2020   BILITOT 0.7 05/17/2020   GFRNONAA 48 (L) 08/12/2020   GFRAA >60 04/27/2017    Lab Results  Component Value Date   WBC 7.4 08/03/2020    NEUTROABS 4.4 10/30/2017   HGB 17.5 (H) 08/03/2020   HCT 53.2 (H) 08/03/2020   MCV 94.5 08/03/2020   PLT 141 (L) 08/03/2020     STUDIES: No results found.  ASSESSMENT: Polycythemia.  PLAN:    Polycythemia:  Patient expressed understanding and was in agreement with this plan. He also understands that He can call clinic at any time with any questions, concerns, or complaints.    Cancer Staging  No matching staging information was  found for the patient.  Lloyd Huger, MD   03/25/2021 8:59 AM

## 2021-03-28 ENCOUNTER — Ambulatory Visit (INDEPENDENT_AMBULATORY_CARE_PROVIDER_SITE_OTHER): Payer: Medicare HMO | Admitting: Pharmacist

## 2021-03-28 ENCOUNTER — Other Ambulatory Visit: Payer: Self-pay

## 2021-03-28 DIAGNOSIS — I10 Essential (primary) hypertension: Secondary | ICD-10-CM

## 2021-03-28 DIAGNOSIS — I480 Paroxysmal atrial fibrillation: Secondary | ICD-10-CM

## 2021-03-28 DIAGNOSIS — I5032 Chronic diastolic (congestive) heart failure: Secondary | ICD-10-CM

## 2021-03-28 DIAGNOSIS — E1140 Type 2 diabetes mellitus with diabetic neuropathy, unspecified: Secondary | ICD-10-CM

## 2021-03-28 DIAGNOSIS — E785 Hyperlipidemia, unspecified: Secondary | ICD-10-CM

## 2021-03-28 DIAGNOSIS — N1831 Chronic kidney disease, stage 3a: Secondary | ICD-10-CM

## 2021-03-28 NOTE — Progress Notes (Signed)
Chronic Care Management Pharmacy Note  03/28/2021 Name:  Craig Lindsey MRN:  [id number] DOB:  May 19, 1960  Summary: CCM Initial visit -Discussed Afib, HTN, HF, HLD and DM. A1c currently 4.9%, pt continues on Ozempic mainly for weight loss and CV benefits -GFR is currently 50 ml/min (02/10/2021 - Nephrology labs), right at cutoff for Xarelto dose reduction  Recommendations/Changes made from today's visit: -Monitor kidney function. If GFR drops below 50 ml/min Xarelto should be reduced to 15 mg.  Plan: -[place] will call patient 3 months for BP/update GFR -Pharmacist follow up televisit scheduled for 1 year -PCP annual physical 05/20/21    Subjective: Craig Lindsey is an 61 y.o. year old male who is a primary patient of Venia Carbon, MD.  The CCM team was consulted for assistance with disease management and care coordination needs.    Engaged with patient by telephone for initial visit in response to provider referral for pharmacy case management and/or care coordination services.   Consent to Services:  The patient was given the following information about Chronic Care Management services today, agreed to services, and gave verbal consent: 1. CCM service includes personalized support from designated clinical staff supervised by the primary care provider, including individualized plan of care and coordination with other care providers 2. 24/7 contact phone numbers for assistance for urgent and routine care needs. 3. Service will only be billed when office clinical staff spend 20 minutes or more in a month to coordinate care. 4. Only one practitioner may furnish and bill the service in a calendar month. 5.The patient may stop CCM services at any time (effective at the end of the month) by phone call to the office staff. 6. The patient will be responsible for cost sharing (co-pay) of up to 20% of the service fee (after annual deductible is met). Patient agreed to services  and consent obtained.  Patient Care Team: Venia Carbon, MD as PCP - General Evans Lance, MD as PCP - Cardiology (Cardiology) Evans Lance, MD as PCP - Electrophysiology (Cardiology) Evans Lance, MD as Consulting Physician (Cardiology) Charlton Haws, [facility] as Pharmacist (Pharmacist)  Recent office visits: 11/18/20-PCP-Richard Letvak,MD-Patient presented for follow up diabetes.using Ozempic 0.57m weekly  Recent consult visits: 02/10/21-Nephrology-Munsoor Lateef,MD- f/u CKD. Will hold off on Farixga d/t improved A1c to 4.9. Discussed CT scan - renal cyst noted. No changes.  10/21/20-Nephrology-Munsoor Lateef,MD-Patient presented for follow up kidney disease.Referral for CT abdomen.  Hospital visits: None in previous 6 months   Objective:  Lab Results  Component Value Date   CREATININE 1.62 (H) 08/12/2020   BUN 41 (H) 08/12/2020   GFR 31.02 (L) 05/17/2020   EGFR 52 (L) 07/07/2020   GFRNONAA 48 (L) 08/12/2020   GFRAA >60 04/27/2017   NA 137 08/12/2020   K 4.1 08/12/2020   CALCIUM 8.9 08/12/2020   CO2 26 08/12/2020   GLUCOSE 118 (H) 08/12/2020    Lab Results  Component Value Date/Time   HGBA1C 4.9 11/18/2020 02:38 PM   HGBA1C 5.1 08/03/2020 10:32 AM   HGBA1C 6.4 05/17/2020 03:02 PM   GFR 31.02 (L) 05/17/2020 03:02 PM   GFR 47.20 (L) 03/24/2019 04:29 PM   MICROALBUR 1.3 02/19/2014 02:59 PM   MICROALBUR 25.1 (H) 09/13/2010 12:46 PM    Last diabetic Eye exam:  Lab Results  Component Value Date/Time   HMDIABEYEEXA No Retinopathy 02/27/2018 12:00 AM    Last diabetic Foot exam:  Lab Results  Component Value Date/Time  HMDIABFOOTEX done 05/17/2020 12:00 AM     Lab Results  Component Value Date   CHOL 103 05/17/2020   HDL 27.40 (L) 05/17/2020   LDLCALC 44 05/17/2020   LDLDIRECT 78.3 03/10/2013   TRIG 156.0 (H) 05/17/2020   CHOLHDL 4 05/17/2020    Hepatic Function Latest Ref Rng & Units 05/17/2020 05/17/2020 03/24/2019  Total Protein 6.0 - 8.3 g/dL  - 7.6 7.3  Albumin 3.5 - 5.2 g/dL 4.4 4.4 4.2  AST 0 - 37 U/L - 17 21  ALT 0 - 53 U/L - 30 27  Alk Phosphatase 39 - 117 U/L - 56 70  Total Bilirubin 0.2 - 1.2 mg/dL - 0.7 0.6  Bilirubin, Direct 0.0 - 0.3 mg/dL - 0.2 0.1    Lab Results  Component Value Date/Time   TSH 0.76 05/21/2014 04:02 PM   TSH 1.69 03/10/2013 12:29 PM   FREET4 1.48 05/17/2020 03:02 PM   FREET4 1.27 03/24/2019 04:29 PM    CBC Latest Ref Rng & Units 08/03/2020 07/07/2020 05/17/2020  WBC 4.0 - 10.5 K/uL 7.4 7.2 8.2  Hemoglobin 13.0 - 17.0 g/dL 17.5(H) 16.5 16.8  Hematocrit 39.0 - 52.0 % 53.2(H) 47.7 50.3  Platelets 150 - 400 K/uL 141(L) 145(L) 147.0(L)    Lab Results  Component Value Date/Time   VD25OH 44.56 05/17/2020 03:02 PM    Clinical ASCVD: No  The ASCVD Risk score (Arnett DK, et al., 2019) failed to calculate for the following reasons:   The valid total cholesterol range is 130 to 320 mg/dL    CHA2DS2/VAS Stroke Risk Points  Current as of yesterday     5 >= 2 Points: High Risk  1 - 1.99 Points: Medium Risk  0 Points: Low Risk    Last Change: N/A      Points Metrics  1 Has Congestive Heart Failure:  Yes    Current as of yesterday  0 Has Vascular Disease:  No    Current as of yesterday  1 Has Hypertension:  Yes    Current as of yesterday  0 Age:  61    Current as of yesterday  1 Has Diabetes:  Yes    Current as of yesterday  2 Had Stroke:  No  Had TIA:  No  Had Thromboembolism:  Yes    Current as of yesterday  0 Male:  No    Current as of yesterday    Depression screen [facility] 2/9 05/17/2020 03/24/2019 03/11/2018  Decreased Interest 0 0 0  Down, Depressed, Hopeless 0 0 0  PHQ - 2 Score 0 0 0  Altered sleeping - - -  Tired, decreased energy - - -  Change in appetite - - -  Feeling bad or failure about yourself  - - -  Trouble concentrating - - -  Moving slowly or fidgety/restless - - -  Suicidal thoughts - - -  PHQ-9 Score - - -  Difficult doing work/chores - - -  Some recent data might  be hidden     Social History   Tobacco Use  Smoking Status Never  Smokeless Tobacco Never   BP Readings from Last 3 Encounters:  11/18/20 130/76  08/12/20 (!) 127/52  08/03/20 (!) 145/67   Pulse Readings from Last 3 Encounters:  11/18/20 (!) 59  08/12/20 61  08/03/20 65   Wt Readings from Last 3 Encounters:  11/18/20 (!) 302 lb (137 kg)  08/12/20 (!) 325 lb (147.4 kg)  08/03/20 (!) 331 lb 3.2 oz (  150.2 kg)   BMI Readings from Last 3 Encounters:  11/18/20 39.84 kg/m  08/12/20 42.88 kg/m  08/03/20 43.70 kg/m    Assessment/Interventions: Review of patient past medical history, allergies, medications, health status, including review of consultants reports, laboratory and other test data, was performed as part of comprehensive evaluation and provision of chronic care management services.   SDOH:  (Social Determinants of Health) assessments and interventions performed: Yes SDOH Interventions    Flowsheet Row Most Recent Value  SDOH Interventions   Food Insecurity Interventions Intervention Not Indicated  Financial Strain Interventions Intervention Not Indicated      SDOH Screenings   Alcohol Screen: Not on file  Depression (PHQ2-9): Low Risk    PHQ-2 Score: 0  Financial Resource Strain: Low Risk    Difficulty of Paying Living Expenses: Not very hard  Food Insecurity: No Food Insecurity   Worried About Charity fundraiser in the Last Year: Never true   Ran Out of Food in the Last Year: Never true  Housing: Not on file  Physical Activity: Not on file  Social Connections: Not on file  Stress: Not on file  Tobacco Use: Low Risk    Smoking Tobacco Use: Never   Smokeless Tobacco Use: Never   Passive Exposure: Not on file  Transportation Needs: Not on file    [place]  Not on File  Medications Reviewed Today     Reviewed by Charlton Haws, [facility] (Pharmacist) on 03/28/21 at 1355  Med List Status: <None>   Medication Order Taking? Sig Documenting  Provider Last Dose Status Informant  ACCU-CHEK FASTCLIX LANCETS [place] [id number] Yes Use to check blood sugar three times a day.  Dx: L54.49 Venia Carbon, MD Taking Active Self  ACCU-CHEK SMARTVIEW test strip [id number] Yes USE AS INSTRUCTED TO TEST BLOOD SUGAR THREE TIMES DAILY Venia Carbon, MD Taking Active Self  acetaminophen (TYLENOL) 500 MG tablet [id number] Yes Take 1,000 mg by mouth every 6 (six) hours as needed (pain). [provider] Taking Active Self  amiodarone (PACERONE) 200 MG tablet [id number] Yes TAKE 1 TABLET EVERY DAY Venia Carbon, MD Taking Active   atorvastatin (LIPITOR) 10 MG tablet [id number] Yes TAKE 1 TABLET BY MOUTH DAILY AT 6 PM. Venia Carbon, MD Taking Active   Blood Pressure Monitor DEVI [id number] Yes  [provider] Taking Active Self  diltiazem (DILACOR XR) 240 MG 24 hr capsule [id number] Yes TAKE 1 CAPSULE EVERY MORNING Venia Carbon, MD Taking Active   furosemide (LASIX) 40 MG tablet [id number] Yes TAKE 1 TABLET EVERY DAY Venia Carbon, MD Taking Active Self  lisinopril (ZESTRIL) 40 MG tablet [id number] Yes TAKE 1 TABLET (40 MG TOTAL) BY MOUTH DAILY. Venia Carbon, MD Taking Active   metoprolol succinate (TOPROL-XL) 100 MG 24 hr tablet [id number] Yes TAKE 2 TABLETS EVERY DAY Venia Carbon, MD Taking Active   Multiple Vitamins-Minerals (BARIATRIC MULTIVITAMINS/IRON PO) [id number] Yes Take 1 tablet by mouth in the morning. [provider] Taking Active Self  OZEMPIC, 0.25 OR 0.5 MG/DOSE, 2 MG/1.5ML SOPN [id number] Yes Inject 0.5 mg into the skin once a week. Venia Carbon, MD Taking Active   XARELTO 20 MG TABS tablet [id number] Yes TAKE 1 TABLET EVERY DAY WITH SUPPER Venia Carbon, MD Taking Active Self            Patient Active Problem List   Diagnosis Date Noted   Polycythemia 03/25/2021   COVID-19 09/17/2019  Thrombocytopenia ([place]) 03/11/2018   Advance directive discussed with patient 03/11/2018    Morbid obesity ([place]) 04/30/2017   Sleep apnea    Chronic diastolic heart failure ([place]) 02/03/2014   Diabetic nephropathy associated with type 2 diabetes mellitus ([place]) 11/11/2013   Stage 3a chronic kidney disease ([place]) 11/11/2013   Personal history of colonic polyps 01/13/2013   Ventral hernia 03/29/2011   Routine general medical examination at a health care facility 09/13/2010   Type 2 diabetes, controlled, with neuropathy ([place]) 03/16/2010   OSTEOARTHRITIS, KNEES, BILATERAL 03/16/2010   Automatic implantable cardioverter-defibrillator in situ 07/16/2009   ADENOCARCINOMA, COLON, HX OF, T3, N1, Sigmoid colectomy 02/13/2007. 05/31/2009   Personal history of colon cancer, stage III 01/17/2007   Hyperlipidemia 06/19/2006   Essential hypertension 06/19/2006   HYPERSOMNIA, ASSOCIATED WITH SLEEP APNEA 06/19/2006   Atrial fibrillation ([place]) 05/23/2006   Ventricular tachycardia ([place]) 12/17/2002    Immunization History  Administered Date(s) Administered   Influenza Whole 10/16/2007, 10/14/2009   Influenza, Seasonal, Injecte, Preservative Fre 02/16/2012   Influenza,inj,Quad PF,6+ Mos 11/26/2012, 11/11/2013, 02/04/2015, 02/23/2016, 01/19/2017, 10/30/2017, 09/12/2018, 09/25/2019, 11/18/2020   Moderna SARS-COV2 Booster Vaccination 01/17/2020   PFIZER(Purple Top)SARS-COV-2 Vaccination 03/28/2019, 04/22/2019   Pneumococcal Conjugate-13 02/23/2016   Pneumococcal Polysaccharide-23 10/14/2009   Tdap 05/03/2011    Conditions to be addressed/monitored:  Hypertension, Hyperlipidemia, Diabetes, Atrial Fibrillation, Heart Failure, and Chronic Kidney Disease  Care Plan : CCM Pharmacy Care Plan  Updates made by Charlton Haws, [place] since 03/28/2021 12:00 AM     Problem: Hypertension, Hyperlipidemia, Diabetes, Atrial Fibrillation, Heart Failure, and Chronic Kidney Disease   Priority: High     Long-Range Goal: Disease mgmt   Start Date: 03/28/2021  Expected End Date: 03/29/2022  This Visit's  Progress: On track  Priority: High  Note:   Current Barriers:  Unable to independently monitor therapeutic efficacy  Pharmacist Clinical Goal(s):  Patient will achieve adherence to monitoring guidelines and medication adherence to achieve therapeutic efficacy through collaboration with PharmD and provider.   Interventions: 1:1 collaboration with Venia Carbon, MD regarding development and update of comprehensive plan of care as evidenced by provider attestation and co-signature Inter-disciplinary care team collaboration (see longitudinal plan of care) Comprehensive medication review performed; medication list updated in electronic medical record  Hypertension / Heart Failure (BP goal <130/80) -Controlled - home BP at goal per pt report; pt denies s/sx of hypotension; he does endorse 70 lb wt loss over past year -Last ejection fraction: 55-60% (Date: 03/2017) -HF type: Diastolic -Current home BP readings: 120/60s -Current treatment: Diltiazem XR 240 mg daily - Appropriate, Effective, Safe, Accessible Furosemide 40 mg daily -Appropriate, Effective, Safe, Accessible Lisinopril 40 mg daily -Appropriate, Effective, Safe, Accessible Metoprolol succinate 100 - 2 tab daily -Appropriate, Effective, Safe, Accessible -Medications previously tried: n/a  -Educated on BP goals and benefits of medications for prevention of heart attack, stroke and kidney damage; Importance of home blood pressure monitoring; Symptoms of hypotension and importance of maintaining adequate hydration; -Counseled to monitor BP at home daily, document, and provide log at future appointments -Recommended to continue current medication; pt to monitor BP closely with continued wt loss  Atrial Fibrillation (Goal: prevent stroke and major bleeding) -Controlled - per pt report and cardiology notes -CHADSVASC: 5; s/p ICD -Current treatment: Amiodarone 200 mg daily -Appropriate, Effective, Safe, Accessible Diltiazem XR 240 mg  daily -Appropriate, Effective, Safe, Accessible Metoprolol succinate 100 mg - 2 tab daily -Appropriate, Effective, Safe, Accessible Xarelto 20 mg daily -Appropriate, Effective, Safe, Accessible -Medications  previously tried: n/a -Counseled on increased risk of stroke due to Afib and benefits of anticoagulation for stroke prevention; importance of adherence to anticoagulant exactly as prescribed; -GFR is currently right at cutoff for Xarelto dose reduction (50 ml/min), if GFR drops below 50 Xarelto should be reduced to 15 mg -Recommend to continue current medication  Hyperlipidemia: (LDL goal < 100) -Controlled - LDL is at goal; pt has no history of ASCVD -Current treatment: Atorvastatin 10 mg daily - Appropriate, Effective, Safe, Accessible -Medications previously tried: n/a  -Educated on Cholesterol goals; Benefits of statin for ASCVD risk reduction; -Recommended to continue current medication  Diabetes (A1c goal <7%) -Controlled - A1c at goal 4.9%, in normal range; A1c peaked 9.1% in 2012; pt has lost 70 lbs on diet over last year -s/p bariatric surgery 04/2017 -Current home glucose readings fasting glucose: 95-110 -Current medications: Ozempic 0.5 mg weekly - Appropriate, Effective, Safe, Accessible Testing supplies -Medications previously tried: glipizide, Lantus, Novolog Mix, metformin, Januvia -Educated on A1c and blood sugar goals; -Discussed continued Ozempic mainly for weight loss and CV benefits -Recommended to continue current medication  Health Maintenance -Vaccine gaps: Shingrix, Covid booster -CKD Stage 3a - follows with Dr Holley Raring -Current therapy:  Tylenol 500 mg PRN Bariatric Multivitamin Deep Blue - Ginger, Green Tea, Pomegranate  -Patient is satisfied with current therapy and denies issues -Recommended to continue current medication  Patient Goals/Self-Care Activities Patient will:  - take medications as prescribed as evidenced by patient report and record  review focus on medication adherence by routine check glucose periodically, document, and provide at future appointments check blood pressure daily, document, and provide at future appointments       Medication Assistance:  Ozempic copay is high in donut hole, however household income is too high for PAP  Compliance/Adherence/Medication fill history: Care Gaps: HIV Screening Eye exam (due 02/28/19)  Star-Rating Drugs: Atorvastatin - PDC 100% Lisinopril - PDC 84%; LF 11/03/20 x 90 ds  Patient's preferred pharmacy is:  [facility], [address] [place] [address] Phone: [phone] Fax: [phone]  Uses pill box? Yes Pt endorses 100% compliance  We discussed: Current pharmacy is preferred with insurance plan and patient is satisfied with pharmacy services Patient decided to: Continue current medication management strategy  Care Plan and Follow Up Patient Decision:  Patient agrees to Care Plan and Follow-up.  Plan: Telephone follow up appointment with care management team member scheduled for:  1 year  Charlene Brooke, PharmD, [facility] Clinical Pharmacist [facility] [phone]

## 2021-03-28 NOTE — Patient Instructions (Addendum)
Visit Information ? ?Phone number for Pharmacist: [phone] ? ?Thank you for meeting with me to discuss your medications! I look forward to working with you to achieve your health care goals. Below is a summary of what we talked about during the visit: ? ? Goals Addressed   ? ?  ?  ?  ?  ? This Visit's Progress  ?  Track and Manage My Blood Pressure-Hypertension     ?  Timeframe:  Long-Range Goal ?Priority:  Medium ?Start Date:        03/28/21                     ?Expected End Date:   03/29/22                   ? ?Follow Up Date Sept 2023 ?  ?- check blood pressure 3 times per week ?- choose a place to take my blood pressure (home, clinic or office, retail store) ?- write blood pressure results in a log or diary  ?  ?Why is this important?   ?You won't feel high blood pressure, but it can still hurt your blood vessels.  ?High blood pressure can cause heart or kidney problems. It can also cause a stroke.  ?Making lifestyle changes like losing a little weight or eating less salt will help.  ?Checking your blood pressure at home and at different times of the day can help to control blood pressure.  ?If the doctor prescribes medicine remember to take it the way the doctor ordered.  ?Call the office if you cannot afford the medicine or if there are questions about it.   ?  ?Notes:  ?  ? ?  ? ? ?Care Plan : [place]  ?Updates made by Charlton Haws, RPH since 03/28/2021 12:00 AM  ?  ? ?Problem: Hypertension, Hyperlipidemia, Diabetes, Atrial Fibrillation, Heart Failure, and Chronic Kidney Disease   ?Priority: High  ?  ? ?Long-Range Goal: Disease mgmt   ?Start Date: 03/28/2021  ?Expected End Date: 03/29/2022  ?This Visit's Progress: On track  ?Priority: High  ?Note:   ?Current Barriers:  ?Unable to independently monitor therapeutic efficacy ? ?Pharmacist Clinical Goal(s):  ?Patient will achieve adherence to monitoring guidelines and medication adherence to achieve therapeutic efficacy through collaboration  with PharmD and provider.  ? ?Interventions: ?1:1 collaboration with Venia Carbon, MD regarding development and update of comprehensive plan of care as evidenced by provider attestation and co-signature ?Inter-disciplinary care team collaboration (see longitudinal plan of care) ?Comprehensive medication review performed; medication list updated in electronic medical record ? ?Hypertension / Heart Failure (BP goal <130/80) ?-Controlled - home BP at goal per pt report; pt denies s/sx of hypotension; he does endorse 70 lb wt loss over past year ?-Last ejection fraction: 55-60% (Date: 03/2017) ?-HF type: Diastolic ?-Current home BP readings: 120/60s ?-Current treatment: ?Diltiazem XR 240 mg daily - Appropriate, Effective, Safe, Accessible ?Furosemide 40 mg daily -Appropriate, Effective, Safe, Accessible ?Lisinopril 40 mg daily -Appropriate, Effective, Safe, Accessible ?Metoprolol succinate 100 - 2 tab daily -Appropriate, Effective, Safe, Accessible ?-Medications previously tried: n/a  ?-Educated on BP goals and benefits of medications for prevention of heart attack, stroke and kidney damage; Importance of home blood pressure monitoring; Symptoms of hypotension and importance of maintaining adequate hydration; ?-Counseled to monitor BP at home daily, document, and provide log at future appointments ?-Recommended to continue current medication; pt to monitor BP closely with continued wt loss ? ?  Atrial Fibrillation (Goal: prevent stroke and major bleeding) ?-Controlled - per pt report and cardiology notes ?-CHADSVASC: 5; s/p ICD ?-Current treatment: ?Amiodarone 200 mg daily -Appropriate, Effective, Safe, Accessible ?Diltiazem XR 240 mg daily -Appropriate, Effective, Safe, Accessible ?Metoprolol succinate 100 mg - 2 tab daily -Appropriate, Effective, Safe, Accessible ?Xarelto 20 mg daily -Appropriate, Effective, Safe, Accessible ?-Medications previously tried: n/a ?-Counseled on increased risk of stroke due to Afib and  benefits of anticoagulation for stroke prevention; importance of adherence to anticoagulant exactly as prescribed; ?-GFR is currently right at cutoff for Xarelto dose reduction (50 ml/min), if GFR drops below 50 Xarelto should be reduced to 15 mg ?-Recommend to continue current medication ? ?Hyperlipidemia: (LDL goal < 100) ?-Controlled - LDL is at goal; pt has no history of ASCVD ?-Current treatment: ?Atorvastatin 10 mg daily - Appropriate, Effective, Safe, Accessible ?-Medications previously tried: n/a  ?-Educated on Cholesterol goals; Benefits of statin for ASCVD risk reduction; ?-Recommended to continue current medication ? ?Diabetes (A1c goal <7%) ?-Controlled - A1c at goal 4.9%, in normal range; A1c peaked 9.1% in 2012; pt has lost 70 lbs on diet over last year ?-s/p bariatric surgery 04/2017 ?-Current home glucose readings ?fasting glucose: 95-110 ?-Current medications: ?Ozempic 0.5 mg weekly - Appropriate, Effective, Safe, Accessible ?Testing supplies ?-Medications previously tried: glipizide, Lantus, Novolog Mix, metformin, Januvia ?-Educated on A1c and blood sugar goals; ?-Discussed continued Ozempic mainly for weight loss and CV benefits ?-Recommended to continue current medication ? ?Health Maintenance ?-Vaccine gaps: Shingrix, Covid booster ?-CKD Stage 3a - follows with Dr Holley Raring ?-Current therapy:  ?Tylenol 500 mg PRN ?Bariatric Multivitamin ?Deep Blue - Ginger, Green Tea, Pomegranate  ?-Patient is satisfied with current therapy and denies issues ?-Recommended to continue current medication ? ?Patient Goals/Self-Care Activities ?Patient will:  ?- take medications as prescribed as evidenced by patient report and record review ?focus on medication adherence by routine ?check glucose periodically, document, and provide at future appointments ?check blood pressure daily, document, and provide at future appointments ? ?  ?  ? ?Mr. Fukushima was given information about Chronic Care Management services today  including:  ?CCM service includes personalized support from designated clinical staff supervised by his physician, including individualized plan of care and coordination with other care providers ?24/7 contact phone numbers for assistance for urgent and routine care needs. ?Standard insurance, coinsurance, copays and deductibles apply for chronic care management only during months in which we provide at least 20 minutes of these services. Most insurances cover these services at 100%, however patients may be responsible for any copay, coinsurance and/or deductible if applicable. This service may help you avoid the need for more expensive face-to-face services. ?Only one practitioner may furnish and bill the service in a calendar month. ?The patient may stop CCM services at any time (effective at the end of the month) by phone call to the office staff. ? ?Patient agreed to services and verbal consent obtained.  ? ?Patient verbalizes understanding of instructions and care plan provided today and agrees to view in [place]. Active MyChart status confirmed with patient.   ?Telephone follow up appointment with pharmacy team member scheduled for: 1 year ? ?Charlene Brooke, PharmD, BCACP ?Clinical Pharmacist ?[facility] ?[phone]  ?

## 2021-03-29 ENCOUNTER — Inpatient Hospital Stay: Payer: Medicare HMO

## 2021-03-29 ENCOUNTER — Encounter: Payer: Self-pay | Admitting: Oncology

## 2021-03-29 ENCOUNTER — Inpatient Hospital Stay: Payer: Medicare HMO | Attending: Oncology | Admitting: Oncology

## 2021-03-29 DIAGNOSIS — Z9049 Acquired absence of other specified parts of digestive tract: Secondary | ICD-10-CM | POA: Diagnosis not present

## 2021-03-29 DIAGNOSIS — Z808 Family history of malignant neoplasm of other organs or systems: Secondary | ICD-10-CM | POA: Insufficient documentation

## 2021-03-29 DIAGNOSIS — Z8719 Personal history of other diseases of the digestive system: Secondary | ICD-10-CM | POA: Insufficient documentation

## 2021-03-29 DIAGNOSIS — E669 Obesity, unspecified: Secondary | ICD-10-CM | POA: Diagnosis not present

## 2021-03-29 DIAGNOSIS — I129 Hypertensive chronic kidney disease with stage 1 through stage 4 chronic kidney disease, or unspecified chronic kidney disease: Secondary | ICD-10-CM | POA: Insufficient documentation

## 2021-03-29 DIAGNOSIS — Z86718 Personal history of other venous thrombosis and embolism: Secondary | ICD-10-CM | POA: Diagnosis not present

## 2021-03-29 DIAGNOSIS — N1831 Chronic kidney disease, stage 3a: Secondary | ICD-10-CM | POA: Insufficient documentation

## 2021-03-29 DIAGNOSIS — Z6836 Body mass index (BMI) 36.0-36.9, adult: Secondary | ICD-10-CM | POA: Diagnosis not present

## 2021-03-29 DIAGNOSIS — E1122 Type 2 diabetes mellitus with diabetic chronic kidney disease: Secondary | ICD-10-CM | POA: Diagnosis not present

## 2021-03-29 DIAGNOSIS — Z85038 Personal history of other malignant neoplasm of large intestine: Secondary | ICD-10-CM | POA: Diagnosis not present

## 2021-03-29 DIAGNOSIS — D751 Secondary polycythemia: Secondary | ICD-10-CM

## 2021-03-29 DIAGNOSIS — Z79899 Other long term (current) drug therapy: Secondary | ICD-10-CM | POA: Diagnosis not present

## 2021-03-29 DIAGNOSIS — E785 Hyperlipidemia, unspecified: Secondary | ICD-10-CM | POA: Diagnosis not present

## 2021-03-29 DIAGNOSIS — Z800 Family history of malignant neoplasm of digestive organs: Secondary | ICD-10-CM | POA: Insufficient documentation

## 2021-03-29 DIAGNOSIS — Z833 Family history of diabetes mellitus: Secondary | ICD-10-CM | POA: Diagnosis not present

## 2021-03-29 DIAGNOSIS — Z7901 Long term (current) use of anticoagulants: Secondary | ICD-10-CM | POA: Diagnosis not present

## 2021-03-29 DIAGNOSIS — E1140 Type 2 diabetes mellitus with diabetic neuropathy, unspecified: Secondary | ICD-10-CM | POA: Insufficient documentation

## 2021-03-29 DIAGNOSIS — I4891 Unspecified atrial fibrillation: Secondary | ICD-10-CM | POA: Insufficient documentation

## 2021-03-29 LAB — IRON AND TIBC
Iron: 78 ug/dL (ref 45–182)
Saturation Ratios: 23 % (ref 17.9–39.5)
TIBC: 340 ug/dL (ref 250–450)
UIBC: 262 ug/dL

## 2021-03-29 LAB — CBC
HCT: 52.5 % — ABNORMAL HIGH (ref 39.0–52.0)
Hemoglobin: 17.2 g/dL — ABNORMAL HIGH (ref 13.0–17.0)
MCH: 30.5 pg (ref 26.0–34.0)
MCHC: 32.8 g/dL (ref 30.0–36.0)
MCV: 93.1 fL (ref 80.0–100.0)
Platelets: 150 10*3/uL (ref 150–400)
RBC: 5.64 MIL/uL (ref 4.22–5.81)
RDW: 13.3 % (ref 11.5–15.5)
WBC: 8.8 10*3/uL (ref 4.0–10.5)
nRBC: 0 % (ref 0.0–0.2)

## 2021-03-29 LAB — FERRITIN: Ferritin: 304 ng/mL (ref 24–336)

## 2021-03-29 NOTE — Progress Notes (Signed)
Pt referred by Dr. Holley Raring for elevated hemoglobin. Pt states that he had colon cancer approximately 9 years ago. ?

## 2021-03-30 LAB — CARBON MONOXIDE, BLOOD (PERFORMED AT REF LAB): Carbon Monoxide, Blood: 1.3 % (ref 0.0–3.6)

## 2021-03-30 LAB — ERYTHROPOIETIN: Erythropoietin: 11.8 m[IU]/mL (ref 2.6–18.5)

## 2021-04-05 LAB — JAK2 GENOTYPR

## 2021-04-11 ENCOUNTER — Ambulatory Visit (INDEPENDENT_AMBULATORY_CARE_PROVIDER_SITE_OTHER): Payer: Medicare HMO

## 2021-04-11 DIAGNOSIS — I4901 Ventricular fibrillation: Secondary | ICD-10-CM | POA: Diagnosis not present

## 2021-04-13 LAB — CUP PACEART REMOTE DEVICE CHECK
Battery Remaining Longevity: 87 mo
Battery Voltage: 3 V
Brady Statistic RV Percent Paced: 0.17 %
Date Time Interrogation Session: 20230327002205
HighPow Impedance: 48 Ohm
HighPow Impedance: 67 Ohm
Implantable Lead Implant Date: 20050113
Implantable Lead Location: 753860
Implantable Lead Model: 6949
Implantable Pulse Generator Implant Date: 20190328
Lead Channel Impedance Value: 361 Ohm
Lead Channel Impedance Value: 532 Ohm
Lead Channel Pacing Threshold Amplitude: 1.625 V
Lead Channel Pacing Threshold Pulse Width: 0.4 ms
Lead Channel Sensing Intrinsic Amplitude: 11.25 mV
Lead Channel Sensing Intrinsic Amplitude: 11.25 mV
Lead Channel Setting Pacing Amplitude: 3.25 V
Lead Channel Setting Pacing Pulse Width: 0.4 ms
Lead Channel Setting Sensing Sensitivity: 0.3 mV

## 2021-04-15 DIAGNOSIS — I480 Paroxysmal atrial fibrillation: Secondary | ICD-10-CM | POA: Diagnosis not present

## 2021-04-15 DIAGNOSIS — E1140 Type 2 diabetes mellitus with diabetic neuropathy, unspecified: Secondary | ICD-10-CM | POA: Diagnosis not present

## 2021-04-15 DIAGNOSIS — N1831 Chronic kidney disease, stage 3a: Secondary | ICD-10-CM | POA: Diagnosis not present

## 2021-04-15 DIAGNOSIS — E785 Hyperlipidemia, unspecified: Secondary | ICD-10-CM

## 2021-04-15 DIAGNOSIS — I119 Hypertensive heart disease without heart failure: Secondary | ICD-10-CM | POA: Diagnosis not present

## 2021-04-17 NOTE — Progress Notes (Signed)
?[place]  ?[phone] [phone] ? ?ID: Craig Lindsey OB: 1960-01-20  MR#: [id number]  UVO#:[id number] ? ?Patient Care Team: ?Venia Carbon, MD as PCP - General ?Evans Lance, MD as PCP - Cardiology (Cardiology) ?Evans Lance, MD as PCP - Electrophysiology (Cardiology) ?Evans Lance, MD as Consulting Physician (Cardiology) ?Foltanski, Cleaster Corin, [facility] as Pharmacist (Pharmacist) ? ?I connected with Craig Lindsey on 04/21/21 at 10:45 AM EDT by video enabled telemedicine visit and verified that I am speaking with the correct person using two identifiers.  ? ?I discussed the limitations, risks, security and privacy concerns of performing an evaluation and management service by telemedicine and the availability of in-person appointments. I also discussed with the patient that there may be a patient responsible charge related to this service. The patient expressed understanding and agreed to proceed.  ? ?Other persons participating in the visit and their role in the encounter: Patient, MD. ? ?Patient?s location: Home. ?Provider?s location: Clinic. ? ?CHIEF COMPLAINT: Polycythemia. ? ?INTERVAL HISTORY: Patient agreed to video assisted telemedicine visit for further evaluation and discussion of his laboratory work.  He continues to feel well and remains asymptomatic.  He has no neurologic complaints.  He denies any recent fevers or illnesses.  He has no new medications.  He has a good appetite and denies weight loss.  He has no chest pain, shortness of breath, cough, or hemoptysis.  He denies any nausea, vomiting, constipation, or diarrhea.  He has no urinary complaints.  Patient offers no specific complaints today. ? ?REVIEW OF SYSTEMS:   ?Review of Systems  ?Constitutional: Negative.  Negative for fever, malaise/fatigue and weight loss.  ?Respiratory: Negative.  Negative for cough, hemoptysis and shortness of breath.   ?Cardiovascular: Negative.  Negative for  chest pain and leg swelling.  ?Gastrointestinal: Negative.  Negative for abdominal pain.  ?Genitourinary: Negative.  Negative for dysuria.  ?Musculoskeletal: Negative.  Negative for back pain.  ?Skin: Negative.  Negative for rash.  ?Neurological: Negative.  Negative for dizziness, focal weakness, weakness and headaches.  ?Psychiatric/Behavioral: Negative.  The patient is not nervous/anxious.   ? ?As per HPI. Otherwise, a complete review of systems is negative. ? ?PAST MEDICAL HISTORY: ?Past Medical History:  ?Diagnosis Date  ? AICD (automatic cardioverter/defibrillator) present   ? Arrhythmia 12/2002  ? 1. Ventricular tachycardia; Dr. [name]  2. Atrial fibrillation  ? Arthritis   ? knees  ? Cancer [facility])   ? Chronic kidney disease   ? Complex L renal cyst, no tx for   ? Diabetes mellitus   ? Type II  ? DVT (deep venous thrombosis) ([place]) 07/2007  ? RLE, Tx Arixtra  ? Dysrhythmia   ? v fib and a fib has icd  ? Hemorrhoids   ? hx of  ? Hyperlipidemia   ? Hypertension   ? Neuropathy   ? Oxaliplatin  from cancer treatment  ? Obesity   ? Personal history of colon cancer, stage III 01/2007  ? Dr. Carlean Purl, Benay Spice, D. Newman  ? Sigmoid colon ulcer 5 yrs ago  ? Sigmoid; Nydia Bouton, MD (colon cancer)  ? Sleep apnea   ? uses cpap setting of 11.2  ? Umbilical hernia   ? ? ?PAST SURGICAL HISTORY: ?Past Surgical History:  ?Procedure Laterality Date  ? CARDIAC DEFIBRILLATOR PLACEMENT    ? 2004  ? CHOLECYSTECTOMY    ? COLECTOMY  1/09  ? Sigmoid; Nydia Bouton, MD (colon cancer)  ? COLONOSCOPY    ?  COLONOSCOPY N/A 02/25/2013  ? Procedure: COLONOSCOPY;  Surgeon: Gatha Mayer, MD;  Location: [facility];  Service: Endoscopy;  Laterality: N/A;  ? HERNIA REPAIR  yrs ago  ? umb hernia  ? ICD GENERATOR CHANGEOUT N/A 04/12/2017  ? Procedure: ICD GENERATOR CHANGEOUT;  Surgeon: Evans Lance, MD;  Location: [facility];  Service: Cardiovascular;  Laterality: N/A;  ? INCISIONAL HERNIA REPAIR N/A 08/12/2020  ? Procedure: HERNIA REPAIR  INCISIONAL with mesh;  Surgeon: Coralie Keens, MD;  Location: [facility];  Service: General;  Laterality: N/A;  LMA  ? LAPAROSCOPIC GASTRIC SLEEVE RESECTION    ? Dr. Lucia Gaskins 04-30-17  ? LAPAROSCOPIC GASTRIC SLEEVE RESECTION N/A 04/30/2017  ? Procedure: LAPAROSCOPIC GASTRIC SLEEVE RESECTION WITH UPPER ENDO;  Surgeon: Alphonsa Overall, MD;  Location: [facility];  Service: General;  Laterality: N/A;  ? ? ?FAMILY HISTORY: ?Family History  ?Problem Relation Age of Onset  ? Diabetes Father   ? Cancer Father   ?     myelofibrosis  ? Colon cancer Other   ? Diabetes Maternal Grandmother   ? Cancer Maternal Aunt   ?     colon  ? Hypertension Neg Hx   ? Coronary artery disease Neg Hx   ? Prostate cancer Neg Hx   ? Esophageal cancer Neg Hx   ? Rectal cancer Neg Hx   ? Stomach cancer Neg Hx   ? ? ?ADVANCED DIRECTIVES (Y/N):  N ? ?HEALTH MAINTENANCE: ?Social History  ? ?Tobacco Use  ? Smoking status: Never  ? Smokeless tobacco: Never  ?Vaping Use  ? Vaping Use: Never used  ?Substance Use Topics  ? Alcohol use: No  ?  Alcohol/week: 0.0 standard drinks  ? Drug use: No  ? ? ? Colonoscopy: ? PAP: ? Bone density: ? Lipid panel: ? ?Not on File ? ?Current Outpatient Medications  ?Medication Sig Dispense Refill  ? ACCU-CHEK FASTCLIX LANCETS MISC Use to check blood sugar three times a day.  Dx: E11.40 306 each 3  ? ACCU-CHEK SMARTVIEW test strip USE AS INSTRUCTED TO TEST BLOOD SUGAR THREE TIMES DAILY 300 each 12  ? acetaminophen (TYLENOL) 500 MG tablet Take 1,000 mg by mouth every 6 (six) hours as needed (pain).    ? amiodarone (PACERONE) 200 MG tablet TAKE 1 TABLET EVERY DAY 90 tablet 3  ? atorvastatin (LIPITOR) 10 MG tablet TAKE 1 TABLET BY MOUTH DAILY AT 6 PM. 90 tablet 3  ? Blood Pressure Monitor DEVI     ? diltiazem (DILACOR XR) 240 MG 24 hr capsule TAKE 1 CAPSULE EVERY MORNING 90 capsule 3  ? furosemide (LASIX) 40 MG tablet TAKE 1 TABLET EVERY DAY 90 tablet 3  ? lisinopril (ZESTRIL) 40 MG tablet TAKE 1 TABLET (40 MG TOTAL) BY MOUTH DAILY. 90  tablet 3  ? metoprolol succinate (TOPROL-XL) 100 MG 24 hr tablet TAKE 2 TABLETS EVERY DAY 180 tablet 3  ? Multiple Vitamins-Minerals (BARIATRIC MULTIVITAMINS/IRON PO) Take 1 tablet by mouth in the morning.    ? OZEMPIC, 0.25 OR 0.5 MG/DOSE, 2 MG/1.5ML SOPN Inject 0.5 mg into the skin once a week. 4.5 mL 4  ? XARELTO 20 MG TABS tablet TAKE 1 TABLET EVERY DAY WITH SUPPER 90 tablet 3  ? ?No current facility-administered medications for this visit.  ? ? ?OBJECTIVE: ?There were no vitals filed for this visit. ?   There is no height or weight on file to calculate BMI.    ECOG FS:0 - Asymptomatic ? ?General: Well-developed,  well-nourished, no acute distress. ?HEENT: Normocephalic. ?Neuro: Alert, answering all questions appropriately. Cranial nerves grossly intact. ?Psych: Normal affect. ? ?LAB RESULTS: ? ?Lab Results  ?Component Value Date  ? NA 137 08/12/2020  ? K 4.1 08/12/2020  ? CL 103 08/12/2020  ? CO2 26 08/12/2020  ? GLUCOSE 118 (H) 08/12/2020  ? BUN 41 (H) 08/12/2020  ? CREATININE 1.62 (H) 08/12/2020  ? CALCIUM 8.9 08/12/2020  ? PROT 7.6 05/17/2020  ? ALBUMIN 4.4 05/17/2020  ? ALBUMIN 4.4 05/17/2020  ? AST 17 05/17/2020  ? ALT 30 05/17/2020  ? ALKPHOS 56 05/17/2020  ? BILITOT 0.7 05/17/2020  ? GFRNONAA 48 (L) 08/12/2020  ? GFRAA >60 04/27/2017  ? ? ?Lab Results  ?Component Value Date  ? WBC 8.8 03/29/2021  ? NEUTROABS 4.4 10/30/2017  ? HGB 17.2 (H) 03/29/2021  ? HCT 52.5 (H) 03/29/2021  ? MCV 93.1 03/29/2021  ? PLT 150 03/29/2021  ? ? ? ?STUDIES: ?CUP PACEART REMOTE DEVICE CHECK ? ?Result Date: 04/13/2021 ?Scheduled remote reviewed. Normal device function.  Next remote 91 days. [place]  ? ?ASSESSMENT: Polycythemia. ? ?PLAN:   ? ?Polycythemia: Patient's hemoglobin is only mildly elevated at 17.2.  Upon review of the chart, patient's hemoglobin has ranged from 15.4-18.0 since July 2011.  Iron stores, erythropoietin, and carbon monoxide levels are within normal limits.  JAK2 mutation is negative.  No intervention is needed  at this time.  Patient does not require phlebotomy.  We discussed the possibility of regular blood donations, but unclear if he is able to secondary to taking Xarelto.  No further follow-up has been schedule

## 2021-04-20 ENCOUNTER — Inpatient Hospital Stay: Payer: Medicare HMO | Attending: Oncology | Admitting: Oncology

## 2021-04-20 DIAGNOSIS — D751 Secondary polycythemia: Secondary | ICD-10-CM

## 2021-04-20 NOTE — Progress Notes (Signed)
Remote ICD transmission.   

## 2021-04-28 ENCOUNTER — Other Ambulatory Visit: Payer: Self-pay | Admitting: Internal Medicine

## 2021-05-20 ENCOUNTER — Encounter: Payer: Self-pay | Admitting: Internal Medicine

## 2021-05-20 ENCOUNTER — Ambulatory Visit (INDEPENDENT_AMBULATORY_CARE_PROVIDER_SITE_OTHER): Payer: Medicare HMO | Admitting: Internal Medicine

## 2021-05-20 VITALS — BP 108/70 | HR 52 | Temp 97.4°F | Ht 73.25 in | Wt 302.0 lb

## 2021-05-20 DIAGNOSIS — G4730 Sleep apnea, unspecified: Secondary | ICD-10-CM | POA: Diagnosis not present

## 2021-05-20 DIAGNOSIS — N1831 Chronic kidney disease, stage 3a: Secondary | ICD-10-CM

## 2021-05-20 DIAGNOSIS — I10 Essential (primary) hypertension: Secondary | ICD-10-CM | POA: Diagnosis not present

## 2021-05-20 DIAGNOSIS — Z125 Encounter for screening for malignant neoplasm of prostate: Secondary | ICD-10-CM

## 2021-05-20 DIAGNOSIS — Z0000 Encounter for general adult medical examination without abnormal findings: Secondary | ICD-10-CM | POA: Diagnosis not present

## 2021-05-20 DIAGNOSIS — I4891 Unspecified atrial fibrillation: Secondary | ICD-10-CM

## 2021-05-20 DIAGNOSIS — E1121 Type 2 diabetes mellitus with diabetic nephropathy: Secondary | ICD-10-CM | POA: Diagnosis not present

## 2021-05-20 DIAGNOSIS — I5032 Chronic diastolic (congestive) heart failure: Secondary | ICD-10-CM

## 2021-05-20 LAB — RENAL FUNCTION PANEL
Albumin: 4.3 g/dL (ref 3.5–5.2)
BUN: 36 mg/dL — ABNORMAL HIGH (ref 6–23)
CO2: 30 mEq/L (ref 19–32)
Calcium: 9.4 mg/dL (ref 8.4–10.5)
Chloride: 102 mEq/L (ref 96–112)
Creatinine, Ser: 1.4 mg/dL (ref 0.40–1.50)
GFR: 54.43 mL/min — ABNORMAL LOW (ref >60.00)
Glucose, Bld: 102 mg/dL — ABNORMAL HIGH (ref 70–99)
Phosphorus: 3.4 mg/dL (ref 2.3–4.6)
Potassium: 4.8 mEq/L (ref 3.5–5.1)
Sodium: 139 mEq/L (ref 135–145)

## 2021-05-20 LAB — LIPID PANEL
Cholesterol: 122 mg/dL (ref 0–200)
HDL: 39.5 mg/dL (ref >39.00)
LDL Cholesterol: 63 mg/dL (ref 0–99)
NonHDL: 82.54
Total CHOL/HDL Ratio: 3
Triglycerides: 100 mg/dL (ref 0.0–149.0)
VLDL: 20 mg/dL (ref 0.0–40.0)

## 2021-05-20 LAB — CBC
HCT: 49.6 % (ref 39.0–52.0)
Hemoglobin: 16.7 g/dL (ref 13.0–17.0)
MCHC: 33.7 g/dL (ref 30.0–36.0)
MCV: 92.1 fl (ref 78.0–100.0)
Platelets: 136 10*3/uL — ABNORMAL LOW (ref 150.0–400.0)
RBC: 5.38 Mil/uL (ref 4.22–5.81)
RDW: 13.9 % (ref 11.5–15.5)
WBC: 6.1 10*3/uL (ref 4.0–10.5)

## 2021-05-20 LAB — MICROALBUMIN / CREATININE URINE RATIO
Creatinine,U: 74.3 mg/dL
Microalb Creat Ratio: 1.8 mg/g (ref 0.0–30.0)
Microalb, Ur: 1.4 mg/dL (ref 0.0–1.9)

## 2021-05-20 LAB — HEMOGLOBIN A1C: Hgb A1c MFr Bld: 5.4 % (ref 4.6–6.5)

## 2021-05-20 LAB — HEPATIC FUNCTION PANEL
ALT: 21 U/L (ref 0–53)
AST: 17 U/L (ref 0–37)
Albumin: 4.3 g/dL (ref 3.5–5.2)
Alkaline Phosphatase: 62 U/L (ref 39–117)
Bilirubin, Direct: 0.2 mg/dL (ref 0.0–0.3)
Total Bilirubin: 0.9 mg/dL (ref 0.2–1.2)
Total Protein: 7.3 g/dL (ref 6.0–8.3)

## 2021-05-20 LAB — VITAMIN D 25 HYDROXY (VIT D DEFICIENCY, FRACTURES): VITD: 46.28 ng/mL (ref 30.00–100.00)

## 2021-05-20 LAB — PSA, MEDICARE: PSA: 0.83 ng/ml (ref 0.10–4.00)

## 2021-05-20 LAB — HM DIABETES FOOT EXAM

## 2021-05-20 LAB — T4, FREE: Free T4: 1.48 ng/dL (ref 0.60–1.60)

## 2021-05-20 NOTE — Assessment & Plan Note (Signed)
On amiodarone '200mg'$  daily and xarelto 20 ?Will check labs including thyroid ?

## 2021-05-20 NOTE — Assessment & Plan Note (Signed)
BP Readings from Last 3 Encounters:  ?05/20/21 108/70  ?03/29/21 (!) 156/79  ?11/18/20 130/76  ? ?On diltiazem 240 mg--as well as metoprolol and lisinopril ?

## 2021-05-20 NOTE — Assessment & Plan Note (Signed)
Uses CPAP nightly ?Will need new mask, etc ?

## 2021-05-20 NOTE — Assessment & Plan Note (Signed)
I have personally reviewed the Medicare Annual Wellness questionnaire and have noted ?1. The patient's medical and social history ?2. Their use of alcohol, tobacco or illicit drugs ?3. Their current medications and supplements ?4. The patient's functional ability including ADL's, fall risks, home safety risks and hearing or visual ?            impairment. ?5. Diet and physical activities ?6. Evidence for depression or mood disorders ? ?The patients weight, height, BMI and visual acuity have been recorded in the chart ?I have made referrals, counseling and provided education to the patient based review of the above and I have provided the pt with a written personalized care plan for preventive services. ? ?I have provided you with a copy of your personalized plan for preventive services. Please take the time to review along with your updated medication list. ? ?Next colon due 2025 ?Discussed PSA ---will check ?Td and shingrix at pharmacy ?Bivalent COVID and flu by the fall ?Is exercising regularly ?

## 2021-05-20 NOTE — Assessment & Plan Note (Signed)
Lab Results  ?Component Value Date  ? HGBA1C 4.9 11/18/2020  ? ?Sugars still good without Rx since weight loss ?Will recheck ?Consider less expensive alternative if higher---?liraglutide ?

## 2021-05-20 NOTE — Assessment & Plan Note (Signed)
Improved again ?Is on lisinopril 40 daily ?Will check labs ?

## 2021-05-20 NOTE — Assessment & Plan Note (Signed)
Has lost 100# ? ?

## 2021-05-20 NOTE — Assessment & Plan Note (Signed)
Compensated now since weight loss ?On metoprolol '200mg'$  daily, lisinopril , furosemide 40 daily ?

## 2021-05-20 NOTE — Progress Notes (Signed)
? ?Subjective:  ? ? Patient ID: Craig Lindsey, male    DOB: 04-11-1960, 61 y.o.   MRN: [id number] ? ?HPI ?Here for Medicare wellness visit and follow up of chronic health conditions ?Reviewed advanced directives ?Reviewed other doctors----Dr Finnegan--hematology, Dr Lateef--nephrology, Dr Alecia Lemming, Dr Tillery--cardiology, Dr Danelle Earthly surg, Dr Dolphus Jenny, [facility], Dr Delrae Rend ?Umbilical hernia repair 0/97---DZ other surgery or hospitalizations ?Has been going to the gym---treadmill and weights---4 days per week ?Vision is okay ?Hearing is fine ?Rare alcohol ?no tobacco ?No falls ?No depression or anhedonia ?Independent with instrumental ADLs ?Mild memory issues--nothing worrisome ? ?Did try the oxempic for 3 months ?Then went up to $700 per month---so stopped a month ago ?No other diabetes medications ?Not checking sugars often---under 100 fasting ?Has lost 100# overall--on the Opdiva diet at first (and is maintaining) ?BMI still ~40 ? ?No chest pain  ?No SOB ?No dizziness or syncope ?No edema ?No recent palpitations--noted if he missed the diuretic ?No headaches ? ?Did see Dr Holley Raring but his GFR did come back up some--last 48 ? ?Current Outpatient Medications on File Prior to Visit  ?Medication Sig Dispense Refill  ? ACCU-CHEK FASTCLIX LANCETS MISC Use to check blood sugar three times a day.  Dx: E11.40 306 each 3  ? ACCU-CHEK SMARTVIEW test strip USE AS INSTRUCTED TO TEST BLOOD SUGAR THREE TIMES DAILY 300 each 12  ? acetaminophen (TYLENOL) 500 MG tablet Take 1,000 mg by mouth every 6 (six) hours as needed (pain).    ? amiodarone (PACERONE) 200 MG tablet TAKE 1 TABLET EVERY DAY 90 tablet 3  ? atorvastatin (LIPITOR) 10 MG tablet TAKE 1 TABLET BY MOUTH DAILY AT 6 PM. 90 tablet 3  ? Blood Pressure Monitor DEVI     ? diltiazem (DILACOR XR) 240 MG 24 hr capsule TAKE 1 CAPSULE EVERY MORNING 90 capsule 3  ? furosemide (LASIX) 40 MG tablet TAKE 1 TABLET EVERY DAY 90 tablet 3  ? lisinopril (ZESTRIL) 40 MG  tablet TAKE 1 TABLET (40 MG TOTAL) BY MOUTH DAILY. 90 tablet 3  ? metoprolol succinate (TOPROL-XL) 100 MG 24 hr tablet TAKE 2 TABLETS EVERY DAY 180 tablet 3  ? Multiple Vitamins-Minerals (BARIATRIC MULTIVITAMINS/IRON PO) Take 1 tablet by mouth in the morning.    ? XARELTO 20 MG TABS tablet TAKE 1 TABLET EVERY DAY WITH SUPPER 90 tablet 3  ? ?No current facility-administered medications on file prior to visit.  ? ? ?No Known Allergies ? ?Past Medical History:  ?Diagnosis Date  ? AICD (automatic cardioverter/defibrillator) present   ? Arrhythmia 12/2002  ? 1. Ventricular tachycardia; Dr. [name]  2. Atrial fibrillation  ? Arthritis   ? knees  ? Cancer [facility])   ? Chronic kidney disease   ? Complex L renal cyst, no tx for   ? Diabetes mellitus   ? Type II  ? DVT (deep venous thrombosis) ([place]) 07/2007  ? RLE, Tx Arixtra  ? Dysrhythmia   ? v fib and a fib has icd  ? Hemorrhoids   ? hx of  ? Hyperlipidemia   ? Hypertension   ? Neuropathy   ? Oxaliplatin  from cancer treatment  ? Obesity   ? Personal history of colon cancer, stage III 01/2007  ? Dr. Carlean Purl, Benay Spice, D. Newman  ? Sigmoid colon ulcer 5 yrs ago  ? Sigmoid; Nydia Bouton, MD (colon cancer)  ? Sleep apnea   ? uses cpap setting of 11.2  ? Umbilical hernia   ? ? ?Past Surgical  History:  ?Procedure Laterality Date  ? CARDIAC DEFIBRILLATOR PLACEMENT    ? 2004  ? CHOLECYSTECTOMY    ? COLECTOMY  1/09  ? Sigmoid; Nydia Bouton, MD (colon cancer)  ? COLONOSCOPY    ? COLONOSCOPY N/A 02/25/2013  ? Procedure: COLONOSCOPY;  Surgeon: Gatha Mayer, MD;  Location: [facility];  Service: Endoscopy;  Laterality: N/A;  ? HERNIA REPAIR  yrs ago  ? umb hernia  ? ICD GENERATOR CHANGEOUT N/A 04/12/2017  ? Procedure: ICD GENERATOR CHANGEOUT;  Surgeon: Evans Lance, MD;  Location: [place] [facility];  Service: Cardiovascular;  Laterality: N/A;  ? INCISIONAL HERNIA REPAIR N/A 08/12/2020  ? Procedure: HERNIA REPAIR INCISIONAL with mesh;  Surgeon: Coralie Keens, MD;  Location: [place];   Service: General;  Laterality: N/A;  LMA  ? LAPAROSCOPIC GASTRIC SLEEVE RESECTION    ? Dr. Lucia Gaskins 04-30-17  ? LAPAROSCOPIC GASTRIC SLEEVE RESECTION N/A 04/30/2017  ? Procedure: LAPAROSCOPIC GASTRIC SLEEVE RESECTION WITH UPPER ENDO;  Surgeon: Alphonsa Overall, MD;  Location: [facility];  Service: General;  Laterality: N/A;  ? ? ?Family History  ?Problem Relation Age of Onset  ? Diabetes Father   ? Cancer Father   ?     myelofibrosis  ? Colon cancer Other   ? Diabetes Maternal Grandmother   ? Cancer Maternal Aunt   ?     colon  ? Hypertension Neg Hx   ? Coronary artery disease Neg Hx   ? Prostate cancer Neg Hx   ? Esophageal cancer Neg Hx   ? Rectal cancer Neg Hx   ? Stomach cancer Neg Hx   ? ? ?Social History  ? ?Socioeconomic History  ? Marital status: Married  ?  Spouse name: Not on file  ? Number of children: 2  ? Years of education: Not on file  ? Highest education level: Not on file  ?Occupational History  ? Occupation: Chartered certified accountant  ?  Comment: Disabled  ?Tobacco Use  ? Smoking status: Never  ?  Passive exposure: Past  ? Smokeless tobacco: Never  ?Vaping Use  ? Vaping Use: Never used  ?Substance and Sexual Activity  ? Alcohol use: No  ?  Alcohol/week: 0.0 standard drinks  ? Drug use: No  ? Sexual activity: Yes  ?Other Topics Concern  ? Not on file  ?Social History Narrative  ? 2 step children from wife's 2nd marriage.  ?   ? Has living will  ? Wife is health care POA---alternate would be sister, Vickie Epley  ? Would accept resuscitation attempts  ? Not sure about tube feedings--but not excited about it   ? ?Social Determinants of Health  ? ?Financial Resource Strain: Low Risk   ? Difficulty of Paying Living Expenses: Not very hard  ?Food Insecurity: No Food Insecurity  ? Worried About Charity fundraiser in the Last Year: Never true  ? Ran Out of Food in the Last Year: Never true  ?Transportation Needs: Not on file  ?Physical Activity: Not on file  ?Stress: Not on file  ?Social Connections: Not on file   ?Intimate Partner Violence: Not on file  ? ?Review of Systems ?Eating okay ?Sleeps well--uses CPAP nightly ?Teeth okay---sees dentist ?Wears seat belt ?No suspicious skin lesions--just some skin tags ?No heartburn or dysphagia ?Bowels move fine--no blood ?Voids fine--good stream. No nocturia ?No sig back pain. Some trouble with right knee---just uses tylenol ?   ?Objective:  ? Physical Exam ?Constitutional:   ?  Appearance: Normal appearance.  ?HENT:  ?   Mouth/Throat:  ?   Comments: No lesions ?Eyes:  ?   Conjunctiva/sclera: Conjunctivae normal.  ?   Pupils: Pupils are equal, round, and reactive to light.  ?Cardiovascular:  ?   Rate and Rhythm: Normal rate and regular rhythm.  ?   Pulses: Normal pulses.  ?   Heart sounds: No murmur heard. ?  No gallop.  ?Pulmonary:  ?   Effort: Pulmonary effort is normal.  ?   Breath sounds: Normal breath sounds. No wheezing or rales.  ?Abdominal:  ?   Palpations: Abdomen is soft.  ?   Tenderness: There is no abdominal tenderness.  ?Musculoskeletal:  ?   Cervical back: Neck supple.  ?   Right lower leg: No edema.  ?   Left lower leg: No edema.  ?Lymphadenopathy:  ?   Cervical: No cervical adenopathy.  ?Skin: ?   Findings: No lesion.  ?   Comments: No foot lesions ?Brown stasis changes both calves  ?Neurological:  ?   General: No focal deficit present.  ?   Mental Status: He is alert and oriented to person, place, and time.  ?   Comments: Normal sensation in feet ?Mini-Cog normal  ?Psychiatric:     ?   Mood and Affect: Mood normal.     ?   Behavior: Behavior normal.  ?  ? ? ? ? ?   ?Assessment & Plan:  ? ?

## 2021-05-20 NOTE — Progress Notes (Signed)
Hearing Screening - Comments:: Passed whisper test ?Vision Screening - Comments:: May 2022/Has appt 06-06-21. ? ?

## 2021-05-22 LAB — PARATHYROID HORMONE, INTACT (NO CA): PTH: 29 pg/mL (ref 16–77)

## 2021-06-07 DIAGNOSIS — E119 Type 2 diabetes mellitus without complications: Secondary | ICD-10-CM | POA: Diagnosis not present

## 2021-06-07 DIAGNOSIS — H5213 Myopia, bilateral: Secondary | ICD-10-CM | POA: Diagnosis not present

## 2021-06-12 ENCOUNTER — Encounter: Payer: Self-pay | Admitting: Internal Medicine

## 2021-06-16 ENCOUNTER — Telehealth: Payer: Self-pay

## 2021-06-16 NOTE — Progress Notes (Signed)
Chronic Care Management Pharmacy Assistant   Name: Craig Lindsey  MRN: [id number] DOB: 03/22/1960  Reason for Encounter: CCM (Hypertension Disease State)  Recent office visits:  05/20/21 Viviana Simpler, MD Medicare Wellness Abnormal Labs "The diabetes control is still very good--and no medication is needed. Cholesterol remains pretty low. Kidney function (BUN, creatinine and GFR) have improved."  Stop (patient preference) Semaglutide 0.5 mg No other changes  Recent consult visits:  04/20/21 Delight Hoh, MD (Oncology): Polycythemia No med changes 04/11/21 Cristopher Peru (Cardiology) Ventricular Fibrillation No other information 03/29/21 Delight Hoh, MD (Oncology): Polycythemia No med changes  Hospital visits:  None in previous 6 months  Medications: Outpatient Encounter Medications as of 06/16/2021  Medication Sig   ACCU-CHEK FASTCLIX LANCETS MISC Use to check blood sugar three times a day.  Dx: E11.40   ACCU-CHEK SMARTVIEW test strip USE AS INSTRUCTED TO TEST BLOOD SUGAR THREE TIMES DAILY   acetaminophen (TYLENOL) 500 MG tablet Take 1,000 mg by mouth every 6 (six) hours as needed (pain).   amiodarone (PACERONE) 200 MG tablet TAKE 1 TABLET EVERY DAY   atorvastatin (LIPITOR) 10 MG tablet TAKE 1 TABLET BY MOUTH DAILY AT 6 PM.   Blood Pressure Monitor DEVI    diltiazem (DILACOR XR) 240 MG 24 hr capsule TAKE 1 CAPSULE EVERY MORNING   furosemide (LASIX) 40 MG tablet TAKE 1 TABLET EVERY DAY   lisinopril (ZESTRIL) 40 MG tablet TAKE 1 TABLET (40 MG TOTAL) BY MOUTH DAILY.   metoprolol succinate (TOPROL-XL) 100 MG 24 hr tablet TAKE 2 TABLETS EVERY DAY   Multiple Vitamins-Minerals (BARIATRIC MULTIVITAMINS/IRON PO) Take 1 tablet by mouth in the morning.   XARELTO 20 MG TABS tablet TAKE 1 TABLET EVERY DAY WITH SUPPER   No facility-administered encounter medications on file as of 06/16/2021.   Recent Office Vitals: BP Readings from Last 3 Encounters:  05/20/21 108/70  03/29/21 (!)  156/79  11/18/20 130/76   Pulse Readings from Last 3 Encounters:  05/20/21 (!) 52  03/29/21 64  11/18/20 (!) 59    Wt Readings from Last 3 Encounters:  05/20/21 (!) 302 lb (137 kg)  03/29/21 299 lb 1.6 oz (135.7 kg)  11/18/20 (!) 302 lb (137 kg)    Kidney Function Lab Results  Component Value Date/Time   CREATININE 1.40 05/20/2021 11:18 AM   CREATININE 1.62 (H) 08/12/2020 05:49 AM   CREATININE 1.47 (H) 02/16/2012 02:52 PM   GFR 54.43 (L) 05/20/2021 11:18 AM   GFRNONAA 48 (L) 08/12/2020 05:49 AM   GFRAA >60 04/27/2017 11:45 AM      Latest Ref Rng & Units 05/20/2021   11:18 AM 08/12/2020    5:49 AM 07/07/2020   12:08 PM  BMP  Glucose 70 - 99 mg/dL 102   118   106    BUN 6 - 23 mg/dL 36   41   34    Creatinine 0.40 - 1.50 mg/dL 1.40   1.62   1.52    BUN/Creat Ratio 10 - 24   22    Sodium 135 - 145 mEq/L 139   137   142    Potassium 3.5 - 5.1 mEq/L 4.8   4.1   5.0    Chloride 96 - 112 mEq/L 102   103   102    CO2 19 - 32 mEq/L '30   26   23    '$ Calcium 8.4 - 10.5 mg/dL 9.4   8.9   9.8  Contacted patient on 06/16/2021 to discuss hypertension disease state  Current antihypertensive regimen:  Diltiazem XR 240 mg daily  Furosemide 40 mg daily  Lisinopril 40 mg daily  Metoprolol succinate 100 - 2 tab daily   Patient verbally confirms he is taking the above medications as directed. Yes  How often are you checking your Blood Pressure? 1-2x per week - I asked patient to take his blood pressure daily. Patient stated he will be in [place] from 06/03 - 06/10. Patient stated he would take it daily starting 06/11. I advised him I would call back on 06/16 for his log. Patient understood.   Wrist or arm cuff: Arm cuff Caffeine intake: Patient does not drink anything with caffeine.  Salt intake: Patient does not add salt to his food Over the counter medications including pseudoephedrine or NSAIDs? Tylenol maybe 2x a week.   What recent interventions/DTPs have been made by any provider  to improve Blood Pressure control since last CPP Visit: Continue BP at home daily  Any recent hospitalizations or ED visits since last visit with CPP? No  What diet changes have been made to improve Blood Pressure Control?  Patient does not add salt to his food at all.  What exercise is being done to improve your Blood Pressure Control?  2 months patient has been going to the gym; walking 2.5 miles  Adherence Review: Is the patient currently on ACE/ARB medication? Yes Does the patient have >5 day gap between last estimated fill dates? Yes  Atorvastatin 10 mg  Lisinopril 40 mg  Star Rating Drugs:  Medication:  Last Fill: Day Supply Atorvastatin 10 mg 01/19/2021 90  Lisinopril 40 mg 11/04/2020 90  Fill dates verified with [facility] Gaps: Annual wellness visit in last year? Yes 05/20/2021 Most Recent BP reading: 108/70 on 05/20/2021  If Diabetic: Most recent A1C reading: 5.4 on 05/20/2021 Last eye exam / retinopathy screening: 02/27/2018 Last diabetic foot exam: Up to date  Upcoming appointments: CCM appointment on 03/28/2022  Charlene Brooke, CPP notified  Marijean Niemann, [place] Assistant [phone]

## 2021-06-30 ENCOUNTER — Emergency Department
Admission: EM | Admit: 2021-06-30 | Discharge: 2021-06-30 | Disposition: A | Discharge status: Home / Self Care | Payer: Medicare HMO | Attending: Emergency Medicine | Admitting: Emergency Medicine

## 2021-06-30 ENCOUNTER — Telehealth: Payer: Medicare HMO | Admitting: Physician Assistant

## 2021-06-30 ENCOUNTER — Emergency Department: Payer: Medicare HMO

## 2021-06-30 DIAGNOSIS — N281 Cyst of kidney, acquired: Secondary | ICD-10-CM | POA: Diagnosis not present

## 2021-06-30 DIAGNOSIS — R300 Dysuria: Secondary | ICD-10-CM | POA: Diagnosis present

## 2021-06-30 DIAGNOSIS — K8689 Other specified diseases of pancreas: Secondary | ICD-10-CM | POA: Diagnosis not present

## 2021-06-30 DIAGNOSIS — N3001 Acute cystitis with hematuria: Secondary | ICD-10-CM | POA: Diagnosis not present

## 2021-06-30 DIAGNOSIS — D72829 Elevated white blood cell count, unspecified: Secondary | ICD-10-CM | POA: Diagnosis not present

## 2021-06-30 DIAGNOSIS — R310 Gross hematuria: Secondary | ICD-10-CM

## 2021-06-30 DIAGNOSIS — K4090 Unilateral inguinal hernia, without obstruction or gangrene, not specified as recurrent: Secondary | ICD-10-CM | POA: Diagnosis not present

## 2021-06-30 DIAGNOSIS — I700 Atherosclerosis of aorta: Secondary | ICD-10-CM | POA: Diagnosis not present

## 2021-06-30 DIAGNOSIS — R000 Tachycardia, unspecified: Secondary | ICD-10-CM | POA: Insufficient documentation

## 2021-06-30 DIAGNOSIS — R350 Frequency of micturition: Secondary | ICD-10-CM

## 2021-06-30 LAB — COMPREHENSIVE METABOLIC PANEL
ALT: 32 U/L (ref 0–44)
AST: 20 U/L (ref 15–41)
Albumin: 4.2 g/dL (ref 3.5–5.0)
Alkaline Phosphatase: 56 U/L (ref 38–126)
Anion gap: 7 (ref 5–15)
BUN: 43 mg/dL — ABNORMAL HIGH (ref 8–23)
CO2: 28 mmol/L (ref 22–32)
Calcium: 9.2 mg/dL (ref 8.9–10.3)
Chloride: 105 mmol/L (ref 98–111)
Creatinine, Ser: 2.01 mg/dL — ABNORMAL HIGH (ref 0.61–1.24)
GFR, Estimated: 37 mL/min — ABNORMAL LOW (ref >60)
Glucose, Bld: 119 mg/dL — ABNORMAL HIGH (ref 70–99)
Potassium: 5 mmol/L (ref 3.5–5.1)
Sodium: 140 mmol/L (ref 135–145)
Total Bilirubin: 1 mg/dL (ref 0.3–1.2)
Total Protein: 7.7 g/dL (ref 6.5–8.1)

## 2021-06-30 LAB — URINALYSIS, ROUTINE W REFLEX MICROSCOPIC
Bacteria, UA: NONE SEEN
RBC / HPF: >50 RBC/hpf — ABNORMAL HIGH (ref 0–5)
Specific Gravity, Urine: 1.018 (ref 1.005–1.030)
Squamous Epithelial / HPF: NONE SEEN (ref 0–5)
WBC, UA: >50 WBC/hpf — ABNORMAL HIGH (ref 0–5)

## 2021-06-30 LAB — CBC WITH DIFFERENTIAL/PLATELET
Abs Immature Granulocytes: 0.06 10*3/uL (ref 0.00–0.07)
Basophils Absolute: 0.1 10*3/uL (ref 0.0–0.1)
Basophils Relative: 0 %
Eosinophils Absolute: 0.2 10*3/uL (ref 0.0–0.5)
Eosinophils Relative: 2 %
HCT: 49.7 % (ref 39.0–52.0)
Hemoglobin: 16.3 g/dL (ref 13.0–17.0)
Immature Granulocytes: 1 %
Lymphocytes Relative: 18 %
Lymphs Abs: 2.1 10*3/uL (ref 0.7–4.0)
MCH: 30.6 pg (ref 26.0–34.0)
MCHC: 32.8 g/dL (ref 30.0–36.0)
MCV: 93.2 fL (ref 80.0–100.0)
Monocytes Absolute: 1 10*3/uL (ref 0.1–1.0)
Monocytes Relative: 8 %
Neutro Abs: 8.8 10*3/uL — ABNORMAL HIGH (ref 1.7–7.7)
Neutrophils Relative %: 71 %
Platelets: 122 10*3/uL — ABNORMAL LOW (ref 150–400)
RBC: 5.33 MIL/uL (ref 4.22–5.81)
RDW: 13.4 % (ref 11.5–15.5)
WBC: 12.3 10*3/uL — ABNORMAL HIGH (ref 4.0–10.5)
nRBC: 0 % (ref 0.0–0.2)

## 2021-06-30 MED ORDER — CEFTRIAXONE SODIUM 1 G IJ SOLR
1.0000 g | Freq: Once | INTRAMUSCULAR | Status: AC
  Administered 2021-06-30: 1 g via INTRAMUSCULAR
  Filled 2021-06-30: qty 10

## 2021-06-30 MED ORDER — CEPHALEXIN 500 MG PO CAPS
500.0000 mg | ORAL_CAPSULE | Freq: Four times a day (QID) | ORAL | 0 refills | Status: DC
Start: 2021-06-30 — End: 2021-07-17

## 2021-06-30 NOTE — ED Provider Notes (Signed)
[facility] Provider Note  Patient Contact: 8:16 PM (approximate)   History   Hematuria   HPI  Craig Lindsey is a 61 y.o. male who presents to the emergency department complaining of hematuria, dysuria, polyuria.  Patient states that his symptoms began with some dysuria and polyuria earlier today.  No penile discharge no concern for STD.  Patient states that throughout the day the frequency increase and he feels like he has to urinate every 20 minutes.  Patient has also had some worsening hematuria through the day.  No history of same.  No history of prostate issues,, nephrolithiasis, UTIs in the past.  Patient did take Azo prior to arrival.     Physical Exam   Triage Vital Signs: ED Triage Vitals  Enc Vitals Group     BP 06/30/21 1852 (!) 155/59     Pulse Rate 06/30/21 1852 (!) 113     Resp 06/30/21 1852 17     Temp 06/30/21 1852 98.4 F (36.9 C)     Temp Source 06/30/21 1852 Oral     SpO2 06/30/21 1852 98 %     Weight --      Height --      Head Circumference --      Peak Flow --      Pain Score 06/30/21 1855 0     Pain Loc --      Pain Edu? --      Excl. in [place]? --     Most recent vital signs: Vitals:   06/30/21 1852  BP: (!) 155/59  Pulse: (!) 113  Resp: 17  Temp: 98.4 F (36.9 C)  SpO2: 98%     General: Alert and in no acute distress.  Cardiovascular:  Good peripheral perfusion Respiratory: Normal respiratory effort without tachypnea or retractions. Lungs CTAB. Good air entry to the bases with no decreased or absent breath sounds Gastrointestinal: Bowel sounds 4 quadrants. Soft and nontender to palpation. No guarding or rigidity. No palpable masses. No distention. No CVA tenderness. Musculoskeletal: Full range of motion to all extremities.  Neurologic:  No gross focal neurologic deficits are appreciated.  Skin:   No rash noted Other:   ED Results / Procedures / Treatments   Labs (all labs ordered are listed, but only  abnormal results are displayed) Labs Reviewed  URINALYSIS, ROUTINE W REFLEX MICROSCOPIC - Abnormal; Notable for the following components:      Result Value   Color, Urine RED (*)    APPearance CLOUDY (*)    Glucose, UA   (*)    Value: TEST NOT REPORTED DUE TO COLOR INTERFERENCE OF URINE PIGMENT   Hgb urine dipstick   (*)    Value: TEST NOT REPORTED DUE TO COLOR INTERFERENCE OF URINE PIGMENT   Bilirubin Urine   (*)    Value: TEST NOT REPORTED DUE TO COLOR INTERFERENCE OF URINE PIGMENT   Ketones, ur   (*)    Value: TEST NOT REPORTED DUE TO COLOR INTERFERENCE OF URINE PIGMENT   Protein, ur   (*)    Value: TEST NOT REPORTED DUE TO COLOR INTERFERENCE OF URINE PIGMENT   Nitrite   (*)    Value: TEST NOT REPORTED DUE TO COLOR INTERFERENCE OF URINE PIGMENT   Leukocytes,Ua   (*)    Value: TEST NOT REPORTED DUE TO COLOR INTERFERENCE OF URINE PIGMENT   RBC / HPF >50 (*)    WBC, UA >50 (*)    All  other components within normal limits  COMPREHENSIVE METABOLIC PANEL - Abnormal; Notable for the following components:   Glucose, Bld 119 (*)    BUN 43 (*)    Creatinine, Ser 2.01 (*)    GFR, Estimated 37 (*)    All other components within normal limits  CBC WITH DIFFERENTIAL/PLATELET - Abnormal; Notable for the following components:   WBC 12.3 (*)    Platelets 122 (*)    Neutro Abs 8.8 (*)    All other components within normal limits     EKG     RADIOLOGY  I personally viewed, evaluated, and interpreted these images as part of my medical decision making, as well as reviewing the written report by the radiologist.  ED Provider Interpretation: No acute findings on CT to include nephrolithiasis or bladder wall thickening  CT Renal Stone Study  Result Date: 06/30/2021 CLINICAL DATA:  Flank pain. EXAM: CT ABDOMEN AND PELVIS WITHOUT CONTRAST TECHNIQUE: Multidetector CT imaging of the abdomen and pelvis was performed following the standard protocol without IV contrast. RADIATION DOSE  REDUCTION: This exam was performed according to the departmental dose-optimization program which includes automated exposure control, adjustment of the mA and/or kV according to patient size and/or use of iterative reconstruction technique. COMPARISON:  CT 11/23/2020 FINDINGS: Lower chest: No focal airspace disease or pleural effusion. Pacemaker wires partially included. The heart is normal in size. The previous right middle lobe nodule is not included in the field of view. Hepatobiliary: No focal hepatic abnormality on this unenhanced exam. Cholecystectomy without biliary dilatation. Pancreas: Mild parenchymal atrophy. No ductal dilatation or inflammation. Spleen: Normal in size without focal abnormality. Adrenals/Urinary Tract: Again seen 2.6 cm low-density left adrenal nodule with Hounsfield units of -7 consistent with benign adenoma. No further follow-up is needed. Slight thickening of the right adrenal gland is stable. No hydronephrosis. No renal calculi. There is mild edema about the parapelvic fat in both kidneys, chronic and unchanged. There are multiple simple cysts in both kidneys, largest measuring 7.4 cm on the right, no dedicated follow-up is recommended. The urinary bladder is completely decompressed. Stomach/Bowel: Gastric suture line, probable prior gastric sleeve resection. No small bowel obstruction or inflammation. Normal appendix. Small to moderate colonic stool burden. Colonic tortuosity. Few sigmoid diverticula without diverticulitis. Vascular/Lymphatic: Normal caliber abdominal aorta with mild atherosclerosis. Few prominent periportal nodes are not enlarged by size criteria and typically reactive. No suspicious abdominopelvic adenopathy. Reproductive: Prominent prostate gland. Other: Postsurgical change of the anterior abdominal wall, the previous soft tissue nodule is no longer seen. There is minimal scarring in this region. Fat containing left inguinal hernia. No abdominopelvic ascites. No  free air. Musculoskeletal: Diffuse degenerative change in the spine with facet hypertrophy and multilevel spondylosis. No acute osseous findings. IMPRESSION: 1. No renal stones or obstructive uropathy. No acute findings or explanation for pain. 2. Postsurgical change of the anterior abdominal wall, the previous soft tissue nodule is no longer seen. There is minimal scarring in this region. 3. Small fat containing left inguinal hernia. 4. Grossly stable bilateral renal cysts. Aortic Atherosclerosis (ICD10-I70.0). Electronically Signed   By: Keith Rake M.D.   On: 06/30/2021 19:40    PROCEDURES:  Critical Care performed: No  Procedures   MEDICATIONS ORDERED IN ED: Medications  cefTRIAXone (ROCEPHIN) injection 1 g (has no administration in time range)     IMPRESSION / MDM / ASSESSMENT AND PLAN / ED COURSE  I reviewed the triage vital signs and the nursing notes.  Differential diagnosis includes, but is not limited to, hematuria, interstitial cystitis, UTI, pyelonephritis, nephrolithiasis  Patient's presentation is most consistent with acute presentation with potential threat to life or bodily function.   Patient's diagnosis is consistent with hematuria, likely UTI.  Patient presents to the ED with dysuria, hematuria, polyuria.  Symptoms began today and rapidly progressed.  Overall exam is reassuring with no tenderness on exam or CVA tenderness.  Patient's vitals are reassuring with no elevation in temperature.  He was slightly tachycardic but states that he is nervous.  At this time, patient's urinalysis was hard to read given the amount of blood plus the fact that the patient took Azo prior to arrival.  Patient had other labs and CT as well.  Elevation in white blood cell count with left shift though CT revealed no acute findings to include bladder wall thickening or nephrolithiasis.  The elevated white blood cell count, left shift, symptoms consistent with UTI  I will treat the patient as a UTI with hematuria.  I have given strict return precautions if the hematuria is worsening, he has worsening symptoms to include fever, cannot keep down medications.  At this time we will treat with Rocephin here in the ED, antibiotics at home.  Follow-up with urology.   Patient is given ED precautions to return to the ED for any worsening or new symptoms.        FINAL CLINICAL IMPRESSION(S) / ED DIAGNOSES   Final diagnoses:  Acute cystitis with hematuria     Rx / DC Orders   ED Discharge Orders          Ordered    cephALEXin (KEFLEX) 500 MG capsule  4 times daily        06/30/21 2025             Note:  This document was prepared using Dragon voice recognition software and may include unintentional dictation errors.   Brynda Peon 06/30/21 2026    Lavonia Drafts, MD 07/01/21 [phone]

## 2021-06-30 NOTE — Progress Notes (Signed)
Virtual Visit Consent   Craig Lindsey, you are scheduled for a virtual visit with a [facility] provider today. Just as with appointments in the office, your consent must be obtained to participate. Your consent will be active for this visit and any virtual visit you may have with one of our providers in the next 365 days. If you have a MyChart account, a copy of this consent can be sent to you electronically.  As this is a virtual visit, video technology does not allow for your provider to perform a traditional examination. This may limit your provider's ability to fully assess your condition. If your provider identifies any concerns that need to be evaluated in person or the need to arrange testing (such as labs, EKG, etc.), we will make arrangements to do so. Although advances in technology are sophisticated, we cannot ensure that it will always work on either your end or our end. If the connection with a video visit is poor, the visit may have to be switched to a telephone visit. With either a video or telephone visit, we are not always able to ensure that we have a secure connection.  By engaging in this virtual visit, you consent to the provision of healthcare and authorize for your insurance to be billed (if applicable) for the services provided during this visit. Depending on your insurance coverage, you may receive a charge related to this service.  I need to obtain your verbal consent now. Are you willing to proceed with your visit today? Craig Lindsey has provided verbal consent on 06/30/2021 for a virtual visit (video or telephone). Leeanne Rio, [place]  Date: 06/30/2021 5:15 PM  Virtual Visit via Video Note   I, Leeanne Rio, connected with  Craig Lindsey  ([id number], 11-Feb-1960) on 06/30/21 at  5:00 PM EDT by a video-enabled telemedicine application and verified that I am speaking with the correct person using two identifiers.  Location: Patient: Virtual Visit  Location Patient: Home Provider: Virtual Visit Location Provider: Home Office   I discussed the limitations of evaluation and management by telemedicine and the availability of in person appointments. The patient expressed understanding and agreed to proceed.    History of Present Illness: Craig Lindsey is a 61 y.o. who identifies as a male who was assigned male at birth, and is being seen today for Urinary symptoms starting mid morning today. Notes this morning going to the restroom and feeling pressure while urinating and noting gross blood in the urine. Has been consistent since since. Denies fevers, chills, nausea or vomiting. Denies back or flank pain. Lower abdominal pressure without pain. Denies any urinary hesitancy.   HPI: HPI  Problems:  Patient Active Problem List   Diagnosis Date Noted   Polycythemia 03/25/2021   Advance directive discussed with patient 03/11/2018   Morbid obesity ([place]) 04/30/2017   Sleep apnea    Chronic diastolic heart failure ([place]) 02/03/2014   Diabetic nephropathy associated with type 2 diabetes mellitus ([place]) 11/11/2013   Stage 3a chronic kidney disease ([place]) 11/11/2013   Personal history of colonic polyps 01/13/2013   Routine general medical examination at a health care facility 09/13/2010   Type 2 diabetes, controlled, with neuropathy ([place]) 03/16/2010   OSTEOARTHRITIS, KNEES, BILATERAL 03/16/2010   Automatic implantable cardioverter-defibrillator in situ 07/16/2009   ADENOCARCINOMA, COLON, HX OF, T3, N1, Sigmoid colectomy 02/13/2007. 05/31/2009   Personal history of colon cancer, stage III 01/17/2007   Hyperlipidemia 06/19/2006   Essential hypertension  06/19/2006   HYPERSOMNIA, ASSOCIATED WITH SLEEP APNEA 06/19/2006   Atrial fibrillation ([place]) 05/23/2006   Ventricular tachycardia ([place]) 12/17/2002    Allergies: No Known Allergies Medications:  Current Outpatient Medications:    ACCU-CHEK FASTCLIX LANCETS MISC, Use to check blood sugar three  times a day.  Dx: E11.40, Disp: 306 each, Rfl: 3   ACCU-CHEK SMARTVIEW test strip, USE AS INSTRUCTED TO TEST BLOOD SUGAR THREE TIMES DAILY, Disp: 300 each, Rfl: 12   acetaminophen (TYLENOL) 500 MG tablet, Take 1,000 mg by mouth every 6 (six) hours as needed (pain)., Disp: , Rfl:    amiodarone (PACERONE) 200 MG tablet, TAKE 1 TABLET EVERY DAY, Disp: 90 tablet, Rfl: 3   atorvastatin (LIPITOR) 10 MG tablet, TAKE 1 TABLET BY MOUTH DAILY AT 6 PM., Disp: 90 tablet, Rfl: 3   Blood Pressure Monitor DEVI, , Disp: , Rfl:    diltiazem (DILACOR XR) 240 MG 24 hr capsule, TAKE 1 CAPSULE EVERY MORNING, Disp: 90 capsule, Rfl: 3   furosemide (LASIX) 40 MG tablet, TAKE 1 TABLET EVERY DAY, Disp: 90 tablet, Rfl: 3   lisinopril (ZESTRIL) 40 MG tablet, TAKE 1 TABLET (40 MG TOTAL) BY MOUTH DAILY., Disp: 90 tablet, Rfl: 3   metoprolol succinate (TOPROL-XL) 100 MG 24 hr tablet, TAKE 2 TABLETS EVERY DAY, Disp: 180 tablet, Rfl: 3   Multiple Vitamins-Minerals (BARIATRIC MULTIVITAMINS/IRON PO), Take 1 tablet by mouth in the morning., Disp: , Rfl:    XARELTO 20 MG TABS tablet, TAKE 1 TABLET EVERY DAY WITH SUPPER, Disp: 90 tablet, Rfl: 3  Observations/Objective: Patient is well-developed, well-nourished in no acute distress.  Resting comfortably at home.  Head is normocephalic, atraumatic.  No labored breathing. Speech is clear and coherent with logical content.  Patient is alert and oriented at baseline.   Assessment and Plan: 1. Urinary frequency  2. Gross hematuria  Discussed likely infectious etiology but all UTI in male are complicated and warrant examination,including possible DRE, as well as UA/culture. If unremarkable would need further workup for other causes of gross hematuria. He has appt with PCP tomorrow. He is to keep that. UC/ER tonight for any worsening symptoms.   Follow Up Instructions: I discussed the assessment and treatment plan with the patient. The patient was provided an opportunity to ask  questions and all were answered. The patient agreed with the plan and demonstrated an understanding of the instructions.  A copy of instructions were sent to the patient via MyChart unless otherwise noted below.   The patient was advised to call back or seek an in-person evaluation if the symptoms worsen or if the condition fails to improve as anticipated.  Time:  I spent 10 minutes with the patient via telehealth technology discussing the above problems/concerns.    Leeanne Rio, PA-C

## 2021-06-30 NOTE — Progress Notes (Addendum)
Called patient for his blood pressure log as previously discussed. Patient only had two read readings.   Date Blood Pressure 06/14 128/61 06/13 110/57  Charlene Brooke, CPP notified  Marijean Niemann, [place] Clinical Pharmacy Assistant [phone]

## 2021-06-30 NOTE — Patient Instructions (Signed)
  Craig Lindsey, thank you for joining Leeanne Rio, PA-C for today's virtual visit.  While this provider is not your primary care provider (PCP), if your PCP is located in our provider database this encounter information will be shared with them immediately following your visit.  Consent: (Patient) Craig Lindsey provided verbal consent for this virtual visit at the beginning of the encounter.  Current Medications:  Current Outpatient Medications:    ACCU-CHEK FASTCLIX LANCETS MISC, Use to check blood sugar three times a day.  Dx: E11.40, Disp: 306 each, Rfl: 3   ACCU-CHEK SMARTVIEW test strip, USE AS INSTRUCTED TO TEST BLOOD SUGAR THREE TIMES DAILY, Disp: 300 each, Rfl: 12   acetaminophen (TYLENOL) 500 MG tablet, Take 1,000 mg by mouth every 6 (six) hours as needed (pain)., Disp: , Rfl:    amiodarone (PACERONE) 200 MG tablet, TAKE 1 TABLET EVERY DAY, Disp: 90 tablet, Rfl: 3   atorvastatin (LIPITOR) 10 MG tablet, TAKE 1 TABLET BY MOUTH DAILY AT 6 PM., Disp: 90 tablet, Rfl: 3   Blood Pressure Monitor DEVI, , Disp: , Rfl:    diltiazem (DILACOR XR) 240 MG 24 hr capsule, TAKE 1 CAPSULE EVERY MORNING, Disp: 90 capsule, Rfl: 3   furosemide (LASIX) 40 MG tablet, TAKE 1 TABLET EVERY DAY, Disp: 90 tablet, Rfl: 3   lisinopril (ZESTRIL) 40 MG tablet, TAKE 1 TABLET (40 MG TOTAL) BY MOUTH DAILY., Disp: 90 tablet, Rfl: 3   metoprolol succinate (TOPROL-XL) 100 MG 24 hr tablet, TAKE 2 TABLETS EVERY DAY, Disp: 180 tablet, Rfl: 3   Multiple Vitamins-Minerals (BARIATRIC MULTIVITAMINS/IRON PO), Take 1 tablet by mouth in the morning., Disp: , Rfl:    XARELTO 20 MG TABS tablet, TAKE 1 TABLET EVERY DAY WITH SUPPER, Disp: 90 tablet, Rfl: 3   Medications ordered in this encounter:  No orders of the defined types were placed in this encounter.    *If you need refills on other medications prior to your next appointment, please contact your pharmacy*  Follow-Up: Call back or seek an in-person evaluation  if the symptoms worsen or if the condition fails to improve as anticipated.  Other Instructions Keep hydrated. Limit caffeine. You can use OTC AZO to help calm down urgency and any pressure. Follow-up tomorrow with your PCP as scheduled for full evaluation and testing.  Anything worsening overnight -- fever, chills, nausea/vomting or inability to urinate -- ER.    If you have been instructed to have an in-person evaluation today at a local Urgent Care facility, please use the link below. It will take you to a list of all of our available [facility], including address, phone number and hours of operation. Please do not delay care.  [facility]  If you or a family member do not have a primary care provider, use the link below to schedule a visit and establish care. When you choose a [facility] primary care physician or advanced practice provider, you gain a long-term partner in health. Find a Primary Care Provider  Learn more about 's in-office and virtual care options: [facility]

## 2021-06-30 NOTE — ED Triage Notes (Signed)
Patient to ER via Pov, reports noticing blood in his urine this morning.  Reports some discomfort when urinating and urinary frequency. Reports taking an azo approx 1 hour ago.

## 2021-07-01 ENCOUNTER — Telehealth: Payer: Self-pay

## 2021-07-01 ENCOUNTER — Ambulatory Visit: Payer: Medicare HMO | Admitting: Nurse Practitioner

## 2021-07-01 NOTE — Telephone Encounter (Signed)
Called pt to see how he was doing after recent ER Visit and to schedule him an ER F/U. Pt states he is doing better. Made appt for 07-07-21.

## 2021-07-07 ENCOUNTER — Encounter: Payer: Self-pay | Admitting: Internal Medicine

## 2021-07-07 ENCOUNTER — Ambulatory Visit (INDEPENDENT_AMBULATORY_CARE_PROVIDER_SITE_OTHER): Payer: Medicare HMO | Admitting: Internal Medicine

## 2021-07-07 DIAGNOSIS — N3001 Acute cystitis with hematuria: Secondary | ICD-10-CM | POA: Insufficient documentation

## 2021-07-07 NOTE — Assessment & Plan Note (Signed)
Fairly classic cystitis symptoms--but not so common in men Certainly predisposed to hematuria with the xarelto Empties well and no cigarettes in the past  Discussed referral for cystoscopy or observation (I feel equipoise) He elects to wait

## 2021-07-08 ENCOUNTER — Encounter: Payer: Self-pay | Admitting: Internal Medicine

## 2021-07-11 ENCOUNTER — Ambulatory Visit (INDEPENDENT_AMBULATORY_CARE_PROVIDER_SITE_OTHER): Payer: Medicare HMO

## 2021-07-11 DIAGNOSIS — I4901 Ventricular fibrillation: Secondary | ICD-10-CM | POA: Diagnosis not present

## 2021-07-13 LAB — CUP PACEART REMOTE DEVICE CHECK
Battery Remaining Longevity: 89 mo
Battery Voltage: 3 V
Brady Statistic RV Percent Paced: 15.49 %
Date Time Interrogation Session: 20230626002203
HighPow Impedance: 47 Ohm
HighPow Impedance: 63 Ohm
Implantable Lead Implant Date: 20050113
Implantable Lead Location: 753860
Implantable Lead Model: 6949
Implantable Pulse Generator Implant Date: 20190328
Lead Channel Impedance Value: 361 Ohm
Lead Channel Impedance Value: 532 Ohm
Lead Channel Pacing Threshold Amplitude: 1.375 V
Lead Channel Pacing Threshold Pulse Width: 0.4 ms
Lead Channel Sensing Intrinsic Amplitude: 9.875 mV
Lead Channel Sensing Intrinsic Amplitude: 9.875 mV
Lead Channel Setting Pacing Amplitude: 2.75 V
Lead Channel Setting Pacing Pulse Width: 0.4 ms
Lead Channel Setting Sensing Sensitivity: 0.3 mV

## 2021-07-17 ENCOUNTER — Encounter: Payer: Self-pay | Admitting: Internal Medicine

## 2021-07-17 ENCOUNTER — Encounter: Payer: Self-pay | Admitting: Emergency Medicine

## 2021-07-17 ENCOUNTER — Emergency Department
Admission: EM | Admit: 2021-07-17 | Discharge: 2021-07-17 | Disposition: A | Discharge status: Home / Self Care | Payer: Medicare HMO | Attending: Emergency Medicine | Admitting: Emergency Medicine

## 2021-07-17 ENCOUNTER — Other Ambulatory Visit: Payer: Self-pay

## 2021-07-17 DIAGNOSIS — R300 Dysuria: Secondary | ICD-10-CM | POA: Diagnosis present

## 2021-07-17 DIAGNOSIS — N3001 Acute cystitis with hematuria: Secondary | ICD-10-CM | POA: Insufficient documentation

## 2021-07-17 LAB — URINALYSIS, ROUTINE W REFLEX MICROSCOPIC
Bacteria, UA: NONE SEEN
Bilirubin Urine: NEGATIVE
Glucose, UA: NEGATIVE mg/dL
Ketones, ur: NEGATIVE mg/dL
Nitrite: NEGATIVE
Protein, ur: 100 mg/dL — AB
RBC / HPF: >50 RBC/hpf — ABNORMAL HIGH (ref 0–5)
Specific Gravity, Urine: 1.021 (ref 1.005–1.030)
Squamous Epithelial / HPF: NONE SEEN (ref 0–5)
WBC, UA: >50 WBC/hpf — ABNORMAL HIGH (ref 0–5)
pH: 5 (ref 5.0–8.0)

## 2021-07-17 MED ORDER — CEFDINIR 300 MG PO CAPS
300.0000 mg | ORAL_CAPSULE | Freq: Two times a day (BID) | ORAL | 0 refills | Status: AC
Start: 2021-07-17 — End: 2021-07-22

## 2021-07-17 NOTE — ED Triage Notes (Signed)
Pt to ED via POV, states was seen several weeks ago with burning with urination and hematuria and dx with UTI. Pt states symptoms started again yesterday, c/o burning with urination and hematuria.

## 2021-07-17 NOTE — ED Provider Notes (Signed)
[facility] Provider Note   Event Date/Time   First MD Initiated Contact with Patient 07/17/21 [phone]     (approximate) History  Dysuria  HPI Craig Lindsey is a 61 y.o. male  Location: Suprapubic Duration: 24 hours prior to arrival Timing: Worsening since onset Severity: 5/10 Quality: Burning/aching pain Context: Patient states that he began having suprapubic abdominal pain and dysuria beginning last night that is now progressed to hematuria as well Modifying factors: Denies Associated Symptoms: Dysuria/hematuria ROS: Patient currently denies any vision changes, tinnitus, difficulty speaking, facial droop, sore throat, chest pain, shortness of breath, abdominal pain, nausea/vomiting/diarrhea, or weakness/numbness/paresthesias in any extremity   Physical Exam  Triage Vital Signs: ED Triage Vitals  Enc Vitals Group     BP 07/17/21 0643 140/66     Pulse Rate 07/17/21 0643 (!) 55     Resp 07/17/21 0643 16     Temp 07/17/21 0643 98.2 F (36.8 C)     Temp Source 07/17/21 0643 Oral     SpO2 07/17/21 0643 100 %     Weight 07/17/21 0639 (!) 304 lb 14.3 oz (138.3 kg)     Height 07/17/21 0639 '6\' 2"'$  (1.88 m)     Head Circumference --      Peak Flow --      Pain Score 07/17/21 0639 5     Pain Loc --      Pain Edu? --      Excl. in [place]? --    Most recent vital signs: Vitals:   07/17/21 0643  BP: 140/66  Pulse: (!) 55  Resp: 16  Temp: 98.2 F (36.8 C)  SpO2: 100%   General: Awake, oriented x4. CV:  Good peripheral perfusion.  Resp:  Normal effort.  Abd:  No distention.  Other:  Middle-aged obese Caucasian male sitting in bed in no acute distress ED Results / Procedures / Treatments  Labs (all labs ordered are listed, but only abnormal results are displayed) Labs Reviewed  URINALYSIS, ROUTINE W REFLEX MICROSCOPIC - Abnormal; Notable for the following components:      Result Value   Color, Urine AMBER (*)    APPearance CLOUDY (*)    Hgb urine  dipstick LARGE (*)    Protein, ur 100 (*)    Leukocytes,Ua MODERATE (*)    RBC / HPF >50 (*)    WBC, UA >50 (*)    All other components within normal limits  PROCEDURES: Critical Care performed: No .1-3 Lead EKG Interpretation  Performed by: Naaman Plummer, MD Authorized by: Naaman Plummer, MD     Interpretation: normal     ECG rate:  58   ECG rate assessment: normal     Rhythm: sinus rhythm     Ectopy: none     Conduction: normal    MEDICATIONS ORDERED IN ED: Medications - No data to display IMPRESSION / MDM / [place] / ED COURSE  I reviewed the triage vital signs and the nursing notes.                             The patient is on the cardiac monitor to evaluate for evidence of arrhythmia and/or significant heart rate changes. Patient's presentation is most consistent with acute presentation with potential threat to life or bodily function. No e/o epididymo-orchitis on exam and low suspicion for rectal abscess, prostatitis, other GU deep space infection, gonorrhea/chlamydia. Unlikely Infected  Urolithiasis, AAA, cholecystitis, pancreatitis, SBO, appendicitis, or other acute abdomen. Workup: UA: None Rx: Cefdinir 300 mg twice daily x5 days  Disposition: Discharge home. SRP discussed. Advise follow up with primary care provider within 24-72 hours.   FINAL CLINICAL IMPRESSION(S) / ED DIAGNOSES   Final diagnoses:  Acute cystitis with hematuria   Rx / DC Orders   ED Discharge Orders          Ordered    cefdinir (OMNICEF) 300 MG capsule  2 times daily        07/17/21 0816           Note:  This document was prepared using Dragon voice recognition software and may include unintentional dictation errors.   Naaman Plummer, MD 07/17/21 [phone]

## 2021-07-17 NOTE — Discharge Instructions (Addendum)
Please use over-the-counter phenazopyridine for any continued bladder pain

## 2021-07-26 ENCOUNTER — Other Ambulatory Visit
Admission: RE | Admit: 2021-07-26 | Discharge: 2021-07-26 | Disposition: A | Discharge status: Home / Self Care | Payer: Medicare HMO | Source: Ambulatory Visit | Attending: Urology | Admitting: Urology

## 2021-07-26 ENCOUNTER — Other Ambulatory Visit: Payer: Self-pay | Admitting: *Deleted

## 2021-07-26 ENCOUNTER — Ambulatory Visit: Payer: Medicare HMO | Admitting: Urology

## 2021-07-26 ENCOUNTER — Encounter: Payer: Self-pay | Admitting: Urology

## 2021-07-26 VITALS — BP 131/76 | HR 55 | Ht 74.0 in | Wt 305.0 lb

## 2021-07-26 DIAGNOSIS — N3001 Acute cystitis with hematuria: Secondary | ICD-10-CM | POA: Diagnosis not present

## 2021-07-26 DIAGNOSIS — N390 Urinary tract infection, site not specified: Secondary | ICD-10-CM

## 2021-07-26 DIAGNOSIS — R310 Gross hematuria: Secondary | ICD-10-CM

## 2021-07-26 DIAGNOSIS — R300 Dysuria: Secondary | ICD-10-CM

## 2021-07-26 LAB — URINALYSIS, COMPLETE (UACMP) WITH MICROSCOPIC
Bacteria, UA: NONE SEEN
Bilirubin Urine: NEGATIVE
Glucose, UA: NEGATIVE mg/dL
Hgb urine dipstick: NEGATIVE
Ketones, ur: NEGATIVE mg/dL
Leukocytes,Ua: NEGATIVE
Nitrite: NEGATIVE
Protein, ur: NEGATIVE mg/dL
Specific Gravity, Urine: 1.02 (ref 1.005–1.030)
pH: 6 (ref 5.0–8.0)

## 2021-07-26 LAB — BLADDER SCAN AMB NON-IMAGING

## 2021-07-26 NOTE — Patient Instructions (Signed)

## 2021-07-26 NOTE — Progress Notes (Signed)
07/26/21 12:08 PM   Craig Lindsey Oct 11, 1960 [id number]  CC: UTI/prostatitis, gross hematuria, dysuria  HPI: I saw Craig Lindsey for the above issues.  He denies any urinary symptoms at baseline.  He was seen in the ER on 06/30/2021 with dysuria, pelvic pain, and gross hematuria and diagnosed with suspected UTI with greater than 50 RBCs and greater than 50 WBCs on urinalysis and treated with Keflex.  A CT scan was also performed at that time which showed no hydronephrosis, decompressed bladder, and no nephrolithiasis.  Urine was not sent for culture.  He was treated with Keflex x5 days and improved.  He then had a recurrence of his urinary symptoms again on 07/17/2021 with dysuria and gross hematuria and again presented to the ER.  This time he was treated with a 7-day course of cefdinir which again resolved his symptoms.  He has not had any problems since that time or recurrent gross hematuria.  PVR today is normal at 0 mL.  Urinalysis today is completely benign.   PMH: Past Medical History:  Diagnosis Date   AICD (automatic cardioverter/defibrillator) present    Arrhythmia 12/2002   1. Ventricular tachycardia; Dr. [name]  2. Atrial fibrillation   Arthritis    knees   Cancer (HCC)    Chronic kidney disease    Complex L renal cyst, no tx for    Diabetes mellitus    Type II   DVT (deep venous thrombosis) ([place]) 07/2007   RLE, Tx Arixtra   Dysrhythmia    v fib and a fib has icd   Hemorrhoids    hx of   Hyperlipidemia    Hypertension    Neuropathy    Oxaliplatin  from cancer treatment   Obesity    Personal history of colon cancer, stage III 01/2007   Dr. Carron Brazen, D. [place]   Sigmoid colon ulcer 5 yrs ago   Sigmoid; DLucia Gaskins, MD (colon cancer)   Sleep apnea    uses cpap setting of 46.6   Umbilical hernia     Surgical History: Past Surgical History:  Procedure Laterality Date   CARDIAC DEFIBRILLATOR PLACEMENT     2004   CHOLECYSTECTOMY     COLECTOMY  1/09    Sigmoid; Nydia Bouton, MD (colon cancer)   COLONOSCOPY     COLONOSCOPY N/A 02/25/2013   Procedure: COLONOSCOPY;  Surgeon: Gatha Mayer, MD;  Location: [facility];  Service: Endoscopy;  Laterality: N/A;   HERNIA REPAIR  yrs ago   umb hernia   ICD GENERATOR CHANGEOUT N/A 04/12/2017   Procedure: [place];  Surgeon: Evans Lance, MD;  Location: [facility];  Service: Cardiovascular;  Laterality: N/A;   INCISIONAL HERNIA REPAIR N/A 08/12/2020   Procedure: HERNIA REPAIR INCISIONAL with mesh;  Surgeon: Coralie Keens, MD;  Location: [facility];  Service: General;  Laterality: N/A;  LMA   LAPAROSCOPIC GASTRIC SLEEVE RESECTION     Dr. Lucia Gaskins 04-30-17   LAPAROSCOPIC GASTRIC SLEEVE RESECTION N/A 04/30/2017   Procedure: LAPAROSCOPIC GASTRIC SLEEVE RESECTION WITH UPPER ENDO;  Surgeon: Alphonsa Overall, MD;  Location: [facility];  Service: General;  Laterality: N/A;     Family History: Family History  Problem Relation Age of Onset   Diabetes Father    Cancer Father        myelofibrosis   Colon cancer Other    Diabetes Maternal Grandmother    Cancer Maternal Aunt        colon  Hypertension Neg Hx    Coronary artery disease Neg Hx    Prostate cancer Neg Hx    Esophageal cancer Neg Hx    Rectal cancer Neg Hx    Stomach cancer Neg Hx     Social History:  reports that he has never smoked. He has been exposed to tobacco smoke. He has never used smokeless tobacco. He reports that he does not drink alcohol and does not use drugs.  Physical Exam: BP 131/76 (BP Location: Left Arm, Patient Position: Sitting, Cuff Size: Large)   Pulse (!) 55   Ht '6\' 2"'$  (1.88 m)   Wt (!) 305 lb (138.3 kg)   BMI 39.16 kg/m    Constitutional:  Alert and oriented, No acute distress. Cardiovascular: No clubbing, cyanosis, or edema. Respiratory: Normal respiratory effort, no increased work of breathing. GI: Abdomen is soft, nontender, nondistended, no abdominal masses GU: Large suprapubic fat pad,  buried penis with patent meatus  Laboratory Data: Reviewed, see HPI  Pertinent Imaging: I have personally viewed and interpreted the CT scan dated 06/30/2021 showing no nephrolithiasis, hydronephrosis, or other urologic abnormalities.  Assessment & Plan:   61 year old male with likely 2 episodes of prostatitis in the last 2 months that were incompletely treated with short courses of antibiotics.  He denies any symptoms today and urinalysis is completely benign.  We discussed need for further evaluation with cystoscopy if he were to develop recurrent infections, and if he develops another infection would recommend Bactrim or Cipro for 3 to 4 weeks.  RTC 6 months symptom check, sooner if recurrent problems.  If recurrent prostatitis recommend Bactrim or Cipro x3 to 4 weeks, and consider cystoscopy    Nickolas Madrid, MD 07/26/2021  [facility] [address] [place], [place] [address] [phone]

## 2021-07-28 ENCOUNTER — Other Ambulatory Visit: Payer: Self-pay

## 2021-07-28 DIAGNOSIS — N410 Acute prostatitis: Secondary | ICD-10-CM

## 2021-07-28 LAB — URINE CULTURE: Culture: 20000 — AB

## 2021-07-28 MED ORDER — CEPHALEXIN 500 MG PO CAPS: 500.0000 mg | ORAL_CAPSULE | Freq: Two times a day (BID) | ORAL | 0 refills | Status: AC

## 2021-08-04 NOTE — Progress Notes (Signed)
Remote ICD transmission.   

## 2021-08-10 ENCOUNTER — Other Ambulatory Visit: Payer: Self-pay | Admitting: Internal Medicine

## 2021-08-10 DIAGNOSIS — I5032 Chronic diastolic (congestive) heart failure: Secondary | ICD-10-CM

## 2021-08-11 NOTE — Telephone Encounter (Signed)
Will forward to Dr [name] as his BUN (43) and Creatinine (2.01) were elevated in hospital labs. Potassium and sodium were in normal ranges.

## 2021-09-20 DIAGNOSIS — Z0100 Encounter for examination of eyes and vision without abnormal findings: Secondary | ICD-10-CM | POA: Diagnosis not present

## 2021-09-21 ENCOUNTER — Telehealth: Payer: Self-pay

## 2021-09-21 NOTE — Progress Notes (Addendum)
    Chronic Care Management Pharmacy Assistant   Name: Craig Lindsey  MRN: [id number] DOB: 04-05-1960  Reason for Encounter: CCM (Cholesterol Medication Adherence)   I called patient to verify he was taken off Atorvastatin and all Diabetic medications. No answer; left message.   Charlene Brooke, CPP notified  Marijean Niemann, [place] Clinical Pharmacy Assistant [phone]

## 2021-09-22 ENCOUNTER — Telehealth: Payer: Self-pay

## 2021-09-22 NOTE — Telephone Encounter (Signed)
Patient came by to pick up meds.  No further action needed at this time.

## 2021-09-22 NOTE — Telephone Encounter (Signed)
Called pt to let him know his medication has arrived. It is in the refrigerator.

## 2021-09-23 NOTE — Progress Notes (Signed)
Called patient; no answer; left message.  Lindsey Foltanski, CPP notified  Caidence Kaseman, RMA Clinical Pharmacy Assistant [phone]   

## 2021-10-10 ENCOUNTER — Ambulatory Visit (INDEPENDENT_AMBULATORY_CARE_PROVIDER_SITE_OTHER): Payer: Medicare HMO

## 2021-10-10 DIAGNOSIS — I4901 Ventricular fibrillation: Secondary | ICD-10-CM

## 2021-10-11 LAB — CUP PACEART REMOTE DEVICE CHECK
Battery Remaining Longevity: 79 mo
Battery Voltage: 3 V
Brady Statistic RV Percent Paced: 43.11 %
Date Time Interrogation Session: 20230925033524
HighPow Impedance: 50 Ohm
HighPow Impedance: 63 Ohm
Implantable Lead Implant Date: 20050113
Implantable Lead Location: 753860
Implantable Lead Model: 6949
Implantable Pulse Generator Implant Date: 20190328
Lead Channel Impedance Value: 361 Ohm
Lead Channel Impedance Value: 513 Ohm
Lead Channel Pacing Threshold Amplitude: 1.625 V
Lead Channel Pacing Threshold Pulse Width: 0.4 ms
Lead Channel Sensing Intrinsic Amplitude: 11.875 mV
Lead Channel Sensing Intrinsic Amplitude: 11.875 mV
Lead Channel Setting Pacing Amplitude: 3.25 V
Lead Channel Setting Pacing Pulse Width: 0.4 ms
Lead Channel Setting Sensing Sensitivity: 0.3 mV

## 2021-10-27 NOTE — Progress Notes (Signed)
Remote ICD transmission.   

## 2021-11-16 ENCOUNTER — Encounter (HOSPITAL_COMMUNITY): Payer: Self-pay | Admitting: *Deleted

## 2021-11-16 ENCOUNTER — Encounter: Payer: Self-pay | Admitting: Internal Medicine

## 2021-11-17 MED ORDER — RIVAROXABAN 20 MG PO TABS: ORAL_TABLET | ORAL | 3 refills | Status: DC

## 2021-11-22 ENCOUNTER — Ambulatory Visit: Payer: Medicare HMO | Admitting: Internal Medicine

## 2021-12-02 ENCOUNTER — Encounter: Payer: Self-pay | Admitting: Internal Medicine

## 2021-12-05 NOTE — Telephone Encounter (Signed)
Forms printed and placed in MD folder for signature. Forms can be faxed once signed.

## 2021-12-12 ENCOUNTER — Encounter: Payer: Self-pay | Admitting: Internal Medicine

## 2021-12-12 ENCOUNTER — Ambulatory Visit (INDEPENDENT_AMBULATORY_CARE_PROVIDER_SITE_OTHER): Payer: Medicare HMO | Admitting: Internal Medicine

## 2021-12-12 VITALS — BP 126/72 | HR 66 | Temp 97.7°F | Ht 74.0 in | Wt 327.0 lb

## 2021-12-12 DIAGNOSIS — I5032 Chronic diastolic (congestive) heart failure: Secondary | ICD-10-CM | POA: Diagnosis not present

## 2021-12-12 DIAGNOSIS — N1831 Chronic kidney disease, stage 3a: Secondary | ICD-10-CM

## 2021-12-12 DIAGNOSIS — E1140 Type 2 diabetes mellitus with diabetic neuropathy, unspecified: Secondary | ICD-10-CM | POA: Diagnosis not present

## 2021-12-12 LAB — RENAL FUNCTION PANEL
Albumin: 4.1 g/dL (ref 3.5–5.2)
BUN: 30 mg/dL — ABNORMAL HIGH (ref 6–23)
CO2: 29 mEq/L (ref 19–32)
Calcium: 8.9 mg/dL (ref 8.4–10.5)
Chloride: 104 mEq/L (ref 96–112)
Creatinine, Ser: 1.39 mg/dL (ref 0.40–1.50)
GFR: 54.68 mL/min — ABNORMAL LOW (ref >60.00)
Glucose, Bld: 112 mg/dL — ABNORMAL HIGH (ref 70–99)
Phosphorus: 3.1 mg/dL (ref 2.3–4.6)
Potassium: 4.1 mEq/L (ref 3.5–5.1)
Sodium: 139 mEq/L (ref 135–145)

## 2021-12-12 LAB — HEMOGLOBIN A1C: Hgb A1c MFr Bld: 6 % (ref 4.6–6.5)

## 2021-12-12 NOTE — Assessment & Plan Note (Signed)
Will recheck GFR today Had been lower Going back to urologist

## 2021-12-12 NOTE — Progress Notes (Signed)
Subjective:    Patient ID: Craig Lindsey, male    DOB: 06/18/1960, 61 y.o.   MRN: [id number]  HPI Here for follow up of diabetes and other chronic health conditions  Has continued on the semaglutide for the past 2-3 months Now on 0.'5mg'$  Sugars usually 95-102---no hypoglycemic reactions  Has gained some weight back Has been lifting weights and walking---not really fluid No edema Feels the weight is around his waistline  No chest pain No SOB No palpitations No dizziness or syncope  GFR 54--was lower after (but on an ER visit)  Current Outpatient Medications on File Prior to Visit  Medication Sig Dispense Refill   ACCU-CHEK FASTCLIX LANCETS MISC Use to check blood sugar three times a day.  Dx: E11.40 306 each 3   ACCU-CHEK SMARTVIEW test strip USE AS INSTRUCTED TO TEST BLOOD SUGAR THREE TIMES DAILY 300 each 12   acetaminophen (TYLENOL) 500 MG tablet Take 1,000 mg by mouth every 6 (six) hours as needed (pain).     amiodarone (PACERONE) 200 MG tablet TAKE 1 TABLET EVERY DAY 90 tablet 3   atorvastatin (LIPITOR) 10 MG tablet TAKE 1 TABLET BY MOUTH DAILY AT 6 PM. 90 tablet 3   Blood Pressure Monitor DEVI      BOOSTRIX 5-2.5-18.5 LF-MCG/0.5 injection      diltiazem (DILACOR XR) 240 MG 24 hr capsule TAKE 1 CAPSULE EVERY MORNING 90 capsule 3   furosemide (LASIX) 40 MG tablet TAKE 1 TABLET EVERY DAY 90 tablet 3   lisinopril (ZESTRIL) 40 MG tablet TAKE 1 TABLET (40 MG TOTAL) BY MOUTH DAILY. 90 tablet 3   metoprolol succinate (TOPROL-XL) 100 MG 24 hr tablet TAKE 2 TABLETS EVERY DAY 180 tablet 3   Multiple Vitamins-Minerals (BARIATRIC MULTIVITAMINS/IRON PO) Take 1 tablet by mouth in the morning.     rivaroxaban (XARELTO) 20 MG TABS tablet TAKE 1 TABLET EVERY DAY WITH SUPPER 90 tablet 3   Semaglutide,0.25 or 0.'5MG'$ /DOS, (OZEMPIC, 0.25 OR 0.5 MG/DOSE,) 2 MG/3ML SOPN Inject 0.5 mg into the skin once a week. Via [employer] PAP     No current facility-administered medications on file prior to  visit.    No Known Allergies  Past Medical History:  Diagnosis Date   AICD (automatic cardioverter/defibrillator) present    Arrhythmia 12/2002   1. Ventricular tachycardia; Dr. [name]  2. Atrial fibrillation   Arthritis    knees   Cancer (HCC)    Chronic kidney disease    Complex L renal cyst, no tx for    Diabetes mellitus    Type II   DVT (deep venous thrombosis) ([place]) 07/2007   RLE, Tx Arixtra   Dysrhythmia    v fib and a fib has icd   Hemorrhoids    hx of   Hyperlipidemia    Hypertension    Neuropathy    Oxaliplatin  from cancer treatment   Obesity    Personal history of colon cancer, stage III 01/2007   Dr. Carron Brazen, D. [place]   Sigmoid colon ulcer 5 yrs ago   Sigmoid; DLucia Gaskins, MD (colon cancer)   Sleep apnea    uses cpap setting of 68.1   Umbilical hernia     Past Surgical History:  Procedure Laterality Date   CARDIAC DEFIBRILLATOR PLACEMENT     2004   CHOLECYSTECTOMY     COLECTOMY  1/09   Sigmoid; Nydia Bouton, MD (colon cancer)   COLONOSCOPY     COLONOSCOPY N/A 02/25/2013  Procedure: COLONOSCOPY;  Surgeon: Gatha Mayer, MD;  Location: [facility];  Service: Endoscopy;  Laterality: N/A;   HERNIA REPAIR  yrs ago   umb hernia   ICD GENERATOR CHANGEOUT N/A 04/12/2017   Procedure: [place];  Surgeon: Evans Lance, MD;  Location: [place] [facility];  Service: Cardiovascular;  Laterality: N/A;   INCISIONAL HERNIA REPAIR N/A 08/12/2020   Procedure: HERNIA REPAIR INCISIONAL with mesh;  Surgeon: Coralie Keens, MD;  Location: [facility] OR;  Service: General;  Laterality: N/A;  LMA   LAPAROSCOPIC GASTRIC SLEEVE RESECTION     Dr. Lucia Gaskins 04-30-17   LAPAROSCOPIC GASTRIC SLEEVE RESECTION N/A 04/30/2017   Procedure: LAPAROSCOPIC GASTRIC SLEEVE RESECTION WITH UPPER ENDO;  Surgeon: Alphonsa Overall, MD;  Location: [facility];  Service: General;  Laterality: N/A;    Family History  Problem Relation Age of Onset   Diabetes Father    Cancer Father         myelofibrosis   Colon cancer Other    Diabetes Maternal Grandmother    Cancer Maternal Aunt        colon   Hypertension Neg Hx    Coronary artery disease Neg Hx    Prostate cancer Neg Hx    Esophageal cancer Neg Hx    Rectal cancer Neg Hx    Stomach cancer Neg Hx     Social History   Socioeconomic History   Marital status: Married    Spouse name: Not on file   Number of children: 2   Years of education: Not on file   Highest education level: Not on file  Occupational History   Occupation: Scientist, water quality and block sales    Comment: Disabled  Tobacco Use   Smoking status: Never    Passive exposure: Past   Smokeless tobacco: Never  Vaping Use   Vaping Use: Never used  Substance and Sexual Activity   Alcohol use: No    Alcohol/week: 0.0 standard drinks of alcohol   Drug use: No   Sexual activity: Yes  Other Topics Concern   Not on file  Social History Narrative   2 step children from wife's 2nd marriage.      Has living will   Wife is health care POA---alternate would be sister, Vickie Epley   Would accept resuscitation attempts   Not sure about tube feedings--but not excited about it    Social Determinants of Health   Financial Resource Strain: Low Risk  (03/28/2021)   Overall Financial Resource Strain (CARDIA)    Difficulty of Paying Living Expenses: Not very hard  Food Insecurity: No Food Insecurity (03/28/2021)   Hunger Vital Sign    Worried About Running Out of Food in the Last Year: Never true    Ran Out of Food in the Last Year: Never true  Transportation Needs: Not on file  Physical Activity: Not on file  Stress: Not on file  Social Connections: Not on file  Intimate Partner Violence: Not on file   Review of Systems Sleeping okay Ongoing knee pain---rarely takes tylenol. Doesn't limit him    Objective:   Physical Exam Constitutional:      Appearance: Normal appearance.  Cardiovascular:     Rate and Rhythm: Normal rate and regular rhythm.     Pulses:  Normal pulses.     Heart sounds: No murmur heard.    No gallop.  Pulmonary:     Effort: Pulmonary effort is normal.     Breath sounds: Normal breath  sounds. No wheezing or rales.  Musculoskeletal:     Cervical back: Neck supple.     Right lower leg: No edema.     Left lower leg: No edema.  Lymphadenopathy:     Cervical: No cervical adenopathy.  Skin:    Comments: Calf stasis changes without any open areas  Neurological:     Mental Status: He is alert.  Psychiatric:        Mood and Affect: Mood normal.        Behavior: Behavior normal.            Assessment & Plan:

## 2021-12-12 NOTE — Assessment & Plan Note (Signed)
Compensated on the metoprolol '100mg'$ , lisinopril 40

## 2021-12-12 NOTE — Assessment & Plan Note (Signed)
Had lost ~70#--now gained back 20.  Will increase the semaglutide if that trend continues

## 2021-12-12 NOTE — Assessment & Plan Note (Signed)
Seems to still have good control on semaglutide 0.'5mg'$  weekly If over 6%, or weight keeps going up, will increase to '1mg'$  (will ask Mendel Ryder for help)

## 2021-12-19 ENCOUNTER — Encounter: Payer: Self-pay | Admitting: Internal Medicine

## 2021-12-21 ENCOUNTER — Ambulatory Visit (INDEPENDENT_AMBULATORY_CARE_PROVIDER_SITE_OTHER): Payer: Medicare HMO | Admitting: Internal Medicine

## 2021-12-21 ENCOUNTER — Encounter: Payer: Self-pay | Admitting: Internal Medicine

## 2021-12-21 VITALS — BP 128/72 | HR 64 | Temp 97.7°F | Ht 74.0 in | Wt 329.0 lb

## 2021-12-21 DIAGNOSIS — J069 Acute upper respiratory infection, unspecified: Secondary | ICD-10-CM | POA: Insufficient documentation

## 2021-12-21 MED ORDER — AMOXICILLIN 500 MG PO TABS: 1000.0000 mg | ORAL_TABLET | Freq: Two times a day (BID) | ORAL | 0 refills | Status: AC

## 2021-12-21 NOTE — Progress Notes (Signed)
Subjective:    Patient ID: Craig Lindsey, male    DOB: April 19, 1960, 61 y.o.   MRN: [id number]  HPI Here due to respiratory infection  May have gotten something from a grandchild Goes back about a week Hoarseness and congestion Some cough No fever, chills or sweats No headache No sig drainage No ear pain No SOB  Tried mucinex DM--may have helped  Current Outpatient Medications on File Prior to Visit  Medication Sig Dispense Refill   ACCU-CHEK FASTCLIX LANCETS MISC Use to check blood sugar three times a day.  Dx: E11.40 306 each 3   ACCU-CHEK SMARTVIEW test strip USE AS INSTRUCTED TO TEST BLOOD SUGAR THREE TIMES DAILY 300 each 12   acetaminophen (TYLENOL) 500 MG tablet Take 1,000 mg by mouth every 6 (six) hours as needed (pain).     amiodarone (PACERONE) 200 MG tablet TAKE 1 TABLET EVERY DAY 90 tablet 3   atorvastatin (LIPITOR) 10 MG tablet TAKE 1 TABLET BY MOUTH DAILY AT 6 PM. 90 tablet 3   Blood Pressure Monitor DEVI      BOOSTRIX 5-2.5-18.5 LF-MCG/0.5 injection      diltiazem (DILACOR XR) 240 MG 24 hr capsule TAKE 1 CAPSULE EVERY MORNING 90 capsule 3   furosemide (LASIX) 40 MG tablet TAKE 1 TABLET EVERY DAY 90 tablet 3   lisinopril (ZESTRIL) 40 MG tablet TAKE 1 TABLET (40 MG TOTAL) BY MOUTH DAILY. 90 tablet 3   metoprolol succinate (TOPROL-XL) 100 MG 24 hr tablet TAKE 2 TABLETS EVERY DAY 180 tablet 3   Multiple Vitamins-Minerals (BARIATRIC MULTIVITAMINS/IRON PO) Take 1 tablet by mouth in the morning.     rivaroxaban (XARELTO) 20 MG TABS tablet TAKE 1 TABLET EVERY DAY WITH SUPPER 90 tablet 3   Semaglutide,0.25 or 0.'5MG'$ /DOS, (OZEMPIC, 0.25 OR 0.5 MG/DOSE,) 2 MG/3ML SOPN Inject 0.5 mg into the skin once a week. Via [employer] PAP     No current facility-administered medications on file prior to visit.    No Known Allergies  Past Medical History:  Diagnosis Date   AICD (automatic cardioverter/defibrillator) present    Arrhythmia 12/2002   1. Ventricular tachycardia; Dr.  [name]  2. Atrial fibrillation   Arthritis    knees   Cancer (HCC)    Chronic kidney disease    Complex L renal cyst, no tx for    Diabetes mellitus    Type II   DVT (deep venous thrombosis) ([place]) 07/2007   RLE, Tx Arixtra   Dysrhythmia    v fib and a fib has icd   Hemorrhoids    hx of   Hyperlipidemia    Hypertension    Neuropathy    Oxaliplatin  from cancer treatment   Obesity    Personal history of colon cancer, stage III 01/2007   Dr. Carron Brazen, D. [place]   Sigmoid colon ulcer 5 yrs ago   Sigmoid; DLucia Gaskins, MD (colon cancer)   Sleep apnea    uses cpap setting of 75.6   Umbilical hernia     Past Surgical History:  Procedure Laterality Date   CARDIAC DEFIBRILLATOR PLACEMENT     2004   CHOLECYSTECTOMY     COLECTOMY  1/09   Sigmoid; Nydia Bouton, MD (colon cancer)   COLONOSCOPY     COLONOSCOPY N/A 02/25/2013   Procedure: COLONOSCOPY;  Surgeon: Gatha Mayer, MD;  [facility]: [facility];  Service: Endoscopy;  Laterality: N/A;   HERNIA REPAIR  yrs ago   umb hernia  ICD GENERATOR CHANGEOUT N/A 04/12/2017   Procedure: [place];  Surgeon: Evans Lance, MD;  Location: [place] [facility];  Service: Cardiovascular;  Laterality: N/A;   INCISIONAL HERNIA REPAIR N/A 08/12/2020   Procedure: HERNIA REPAIR INCISIONAL with mesh;  Surgeon: Coralie Keens, MD;  Location: [facility] OR;  Service: General;  Laterality: N/A;  LMA   LAPAROSCOPIC GASTRIC SLEEVE RESECTION     Dr. Lucia Gaskins 04-30-17   LAPAROSCOPIC GASTRIC SLEEVE RESECTION N/A 04/30/2017   Procedure: LAPAROSCOPIC GASTRIC SLEEVE RESECTION WITH UPPER ENDO;  Surgeon: Alphonsa Overall, MD;  Location: [facility];  Service: General;  Laterality: N/A;    Family History  Problem Relation Age of Onset   Diabetes Father    Cancer Father        myelofibrosis   Colon cancer Other    Diabetes Maternal Grandmother    Cancer Maternal Aunt        colon   Hypertension Neg Hx    Coronary artery disease Neg Hx    Prostate  cancer Neg Hx    Esophageal cancer Neg Hx    Rectal cancer Neg Hx    Stomach cancer Neg Hx     Social History   Socioeconomic History   Marital status: Married    Spouse name: Not on file   Number of children: 2   Years of education: Not on file   Highest education level: Not on file  Occupational History   Occupation: Scientist, water quality and block sales    Comment: Disabled  Tobacco Use   Smoking status: Never    Passive exposure: Past   Smokeless tobacco: Never  Vaping Use   Vaping Use: Never used  Substance and Sexual Activity   Alcohol use: No    Alcohol/week: 0.0 standard drinks of alcohol   Drug use: No   Sexual activity: Yes  Other Topics Concern   Not on file  Social History Narrative   2 step children from wife's 2nd marriage.      Has living will   Wife is health care POA---alternate would be sister, Vickie Epley   Would accept resuscitation attempts   Not sure about tube feedings--but not excited about it    Social Determinants of Health   Financial Resource Strain: Low Risk  (03/28/2021)   Overall Financial Resource Strain (CARDIA)    Difficulty of Paying Living Expenses: Not very hard  Food Insecurity: No Food Insecurity (03/28/2021)   Hunger Vital Sign    Worried About Running Out of Food in the Last Year: Never true    Ran Out of Food in the Last Year: Never true  Transportation Needs: Not on file  Physical Activity: Not on file  Stress: Not on file  Social Connections: Not on file  Intimate Partner Violence: Not on file   Review of Systems Weight stable No edema or fluid changes No N/V Eating okay    Objective:   Physical Exam Constitutional:      Appearance: Normal appearance.  HENT:     Head:     Comments: No sinus tenderness    Right Ear: Tympanic membrane and ear canal normal.     Left Ear: Tympanic membrane and ear canal normal.     Nose: Congestion present.     Mouth/Throat:     Pharynx: No oropharyngeal exudate or posterior oropharyngeal  erythema.  Cardiovascular:     Rate and Rhythm: Normal rate and regular rhythm.     Heart sounds: No murmur  heard.    No gallop.  Pulmonary:     Effort: Pulmonary effort is normal.     Breath sounds: Normal breath sounds. No wheezing or rales.  Musculoskeletal:     Right lower leg: No edema.     Left lower leg: No edema.  Neurological:     Mental Status: He is alert.            Assessment & Plan:

## 2021-12-21 NOTE — Assessment & Plan Note (Signed)
Hopefully just self limited viral infection Doesn't seem to be in his chest Discussed symptomatic care--DM cough meds, tylenol If worsens in sinuses, fill amoxil Rx

## 2022-01-05 ENCOUNTER — Other Ambulatory Visit: Payer: Self-pay | Admitting: Internal Medicine

## 2022-01-26 ENCOUNTER — Ambulatory Visit (INDEPENDENT_AMBULATORY_CARE_PROVIDER_SITE_OTHER): Payer: Medicare HMO

## 2022-01-26 DIAGNOSIS — I472 Ventricular tachycardia, unspecified: Secondary | ICD-10-CM | POA: Diagnosis not present

## 2022-01-26 LAB — CUP PACEART REMOTE DEVICE CHECK
Battery Remaining Longevity: 81 mo
Battery Voltage: 3 V
Brady Statistic RV Percent Paced: 1.56 %
Date Time Interrogation Session: 20240111022826
HighPow Impedance: 53 Ohm
HighPow Impedance: 68 Ohm
Implantable Lead Connection Status: 753985
Implantable Lead Implant Date: 20050113
Implantable Lead Location: 753860
Implantable Lead Model: 6949
Implantable Pulse Generator Implant Date: 20190328
Lead Channel Impedance Value: 342 Ohm
Lead Channel Impedance Value: 513 Ohm
Lead Channel Pacing Threshold Amplitude: 1.25 V
Lead Channel Pacing Threshold Pulse Width: 0.4 ms
Lead Channel Sensing Intrinsic Amplitude: 10 mV
Lead Channel Sensing Intrinsic Amplitude: 10 mV
Lead Channel Setting Pacing Amplitude: 2.75 V
Lead Channel Setting Pacing Pulse Width: 0.4 ms
Lead Channel Setting Sensing Sensitivity: 0.3 mV
Zone Setting Status: 755011
Zone Setting Status: 755011

## 2022-01-31 ENCOUNTER — Ambulatory Visit: Payer: Medicare HMO | Admitting: Urology

## 2022-02-07 ENCOUNTER — Encounter: Payer: Self-pay | Admitting: Internal Medicine

## 2022-02-15 NOTE — Progress Notes (Signed)
Remote ICD transmission.   

## 2022-02-21 ENCOUNTER — Ambulatory Visit: Payer: Medicare HMO | Admitting: Urology

## 2022-02-21 VITALS — BP 144/75 | HR 73 | Ht 74.0 in | Wt 337.8 lb

## 2022-02-21 DIAGNOSIS — Z87438 Personal history of other diseases of male genital organs: Secondary | ICD-10-CM | POA: Diagnosis not present

## 2022-02-21 LAB — BLADDER SCAN AMB NON-IMAGING

## 2022-02-21 NOTE — Progress Notes (Signed)
   02/21/2022 10:51 AM   Craig Lindsey Oct 06, 1960 [id number]  Reason for visit: Follow up prostatitis  HPI: 62 year old male who I saw in July 2023 who was originally seen in the ER in June 2023 with dysuria, pelvic pain, gross hematuria, and diagnosed with UTI/prostatitis.  Urine was not sent for culture.  CT at that time was benign.  He improved with a 5-day course of Keflex, but had recurrence of his symptoms just 2 weeks later, and was treated again by the ER with just 7 days of cefdinir.  When I saw him in clinic on 07/26/2021 symptoms had mostly resolved, but culture grew E. coli again, and he opted for a 4-week course of Keflex.  He denies any problems at all since that time.  Urinating with a good stream and denies any dysuria or gross hematuria.  PVR today is normal at 71m.  Return precautions discussed including recurrent UTIs, worsening urinary symptoms, or gross hematuria.  Can follow-up with urology as needed.  [facility] [address] [place] [phone]

## 2022-03-03 ENCOUNTER — Encounter: Payer: Self-pay | Admitting: Internal Medicine

## 2022-03-06 MED ORDER — SEMAGLUTIDE (1 MG/DOSE) 4 MG/3ML ~~LOC~~ SOPN: 1.0000 mg | PEN_INJECTOR | SUBCUTANEOUS | 3 refills | Status: DC

## 2022-03-11 ENCOUNTER — Other Ambulatory Visit: Payer: Self-pay | Admitting: Internal Medicine

## 2022-03-23 ENCOUNTER — Telehealth: Payer: Self-pay

## 2022-03-23 NOTE — Progress Notes (Signed)
Care Management & Coordination Services Pharmacy Team  Reason for Encounter: Appointment Reminder  Contacted patient to confirm telephone appointment with Charlene Brooke, PharmD on 03/28/2022 at 3:00.  Spoke with patient on 03/24/2022   Do you have any problems getting your medications? No  What is your top health concern you would like to discuss at your upcoming visit? Stated he does not have any  Have you seen any other providers since your last visit with PCP? Yes  Star Rating Drugs:  Medication:  Last Fill: Day Supply Atorvastatin 10 mg 01/05/22 90 Lisinopril 40 mg 01/05/22 90 Ozempic   PAP  Care Gaps: Annual wellness visit in last year? Yes 05/20/2021  If Diabetic: Last eye exam / retinopathy screening: Over due Last diabetic foot exam: Up to date  Charlene Brooke, PharmD notified  Marijean Niemann, [place] Assistant [phone]

## 2022-03-28 ENCOUNTER — Ambulatory Visit: Payer: Medicare HMO | Admitting: Pharmacist

## 2022-03-28 NOTE — Progress Notes (Signed)
Care Management & Coordination Services Pharmacy Note  03/28/2022 Name:  Craig Lindsey MRN:  [id number] DOB:  [birth year]  Summary: -DM: A1c 6.0% (11/2021) at goal in prediabetic range, pt uses Ozempic to help with weight loss (he lost 70 lbs over past ~year, but has gained about 20 lbs back), recently Ozempic was increased to 1 mg and he is tolerating this -HFpEF/HTN: BP is at goal at home and pt denies issues with swelling -Afib: on Xarelto 20 mg, he uses [employer] assistance program for discounted Rx during the donut hole  Recommendations/Changes made from today's visit: -No med changes  Follow up plan: -Health Concierge will call patient 6 months for general update -Pharmacist follow up PRN -PCP appt 06/13/22 (CPE)    Subjective: Craig Lindsey is an 62 y.o. year old male who is a primary patient of Craig Carbon, MD.  The care coordination team was consulted for assistance with disease management and care coordination needs.    Engaged with patient by telephone for follow up visit.  Recent office visits: 12/21/21 Craig Lindsey OV: URI - rx amoxicillin  12/12/21 Craig Lindsey OV: f/u- A1c 6.0%; had last 70 lbs, no gained back 20 lb.  Recent consult visits: 02/21/22 Craig Lindsey (Urology): hx prostatitis, resolved. F/u PRN  Hospital visits: None in previous 6 months   Objective:  Lab Results  Component Value Date   CREATININE 1.39 12/12/2021   BUN 30 (H) 12/12/2021   GFR 54.68 (L) 12/12/2021   EGFR 52 (L) 07/07/2020   GFRNONAA 37 (L) 06/30/2021   GFRAA >60 04/27/2017   NA 139 12/12/2021   K 4.1 12/12/2021   CALCIUM 8.9 12/12/2021   CO2 29 12/12/2021   GLUCOSE 112 (H) 12/12/2021    Lab Results  Component Value Date/Time   HGBA1C 6.0 12/12/2021 10:51 AM   HGBA1C 5.4 05/20/2021 11:18 AM   GFR 54.68 (L) 12/12/2021 10:51 AM   GFR 54.43 (L) 05/20/2021 11:18 AM   MICROALBUR 1.4 05/20/2021 11:28 AM   MICROALBUR 1.3 02/19/2014 02:59 PM    Last diabetic Eye exam:  Lab  Results  Component Value Date/Time   HMDIABEYEEXA No Retinopathy 02/27/2018 12:00 AM    Last diabetic Foot exam:  Lab Results  Component Value Date/Time   HMDIABFOOTEX done 05/20/2021 12:00 AM     Lab Results  Component Value Date   CHOL 122 05/20/2021   HDL 39.50 05/20/2021   LDLCALC 63 05/20/2021   LDLDIRECT 78.3 03/10/2013   TRIG 100.0 05/20/2021   CHOLHDL 3 05/20/2021       Latest Ref Rng & Units 12/12/2021   10:51 AM 06/30/2021    7:12 PM 05/20/2021   11:18 AM  Hepatic Function  Total Protein 6.5 - 8.1 g/dL  7.7  7.3   Albumin 3.5 - 5.2 g/dL 4.1  4.2  4.3    4.3   AST 15 - 41 U/L  20  17   ALT 0 - 44 U/L  32  21   Alk Phosphatase 38 - 126 U/L  56  62   Total Bilirubin 0.3 - 1.2 mg/dL  1.0  0.9   Bilirubin, Direct 0.0 - 0.3 mg/dL   0.2     Lab Results  Component Value Date/Time   TSH 0.76 05/21/2014 04:02 PM   TSH 1.69 03/10/2013 12:29 PM   FREET4 1.48 05/20/2021 11:18 AM   FREET4 1.48 05/17/2020 03:02 PM       Latest Ref Rng & Units 06/30/2021  7:12 PM 05/20/2021   11:18 AM 03/29/2021    3:18 PM  CBC  WBC 4.0 - 10.5 K/uL 12.3  6.1  8.8   Hemoglobin 13.0 - 17.0 g/dL 16.3  16.7  17.2   Hematocrit 39.0 - 52.0 % 49.7  49.6  52.5   Platelets 150 - 400 K/uL 122  136.0  150     Lab Results  Component Value Date/Time   VD25OH 46.28 05/20/2021 11:18 AM   VD25OH 44.56 05/17/2020 03:02 PM   VITAMINB12 635 10/30/2017 01:06 PM   [id number] 06/01/2008 11:27 AM    Clinical ASCVD: No  The ASCVD Risk score (Arnett DK, et al., 2019) failed to calculate for the following reasons:   The valid total cholesterol range is 130 to 320 mg/dL    CHA2DS2/VAS Stroke Risk Points  Current as of 4 days ago     5 >= 2 Points: High Risk  1 - 1.99 Points: Medium Risk  0 Points: Low Risk    Last Change: N/A      Points Metrics  1 Has Congestive Heart Failure:  Yes    Current as of 4 days ago  0 Has Vascular Disease:  No    Current as of 4 days ago  1 Has Hypertension:   Yes    Current as of 4 days ago  0 Age:  15    Current as of 4 days ago  1 Has Diabetes:  Yes    Current as of 4 days ago  2 Had Stroke:  No  Had TIA:  No  Had Thromboembolism:  Yes    Current as of 4 days ago  0 Male:  No    Current as of 4 days ago        05/20/2021   11:01 AM 05/17/2020    2:40 PM 03/24/2019    4:14 PM  Depression screen PHQ 2/9  Decreased Interest 0 0 0  Down, Depressed, Hopeless 0 0 0  PHQ - 2 Score 0 0 0     Social History   Tobacco Use  Smoking Status Never   Passive exposure: Past  Smokeless Tobacco Never   BP Readings from Last 3 Encounters:  02/21/22 (!) 144/75  12/21/21 128/72  12/12/21 126/72   Pulse Readings from Last 3 Encounters:  02/21/22 73  12/21/21 64  12/12/21 66   Wt Readings from Last 3 Encounters:  02/21/22 (!) 337 lb 12.8 oz (153.2 kg)  12/21/21 (!) 329 lb (149.2 kg)  12/12/21 (!) 327 lb (148.3 kg)   BMI Readings from Last 3 Encounters:  02/21/22 43.37 kg/m  12/21/21 42.24 kg/m  12/12/21 41.98 kg/m    No Known Allergies  Medications Reviewed Today     Reviewed by Craig Lindsey, [place] (Certified Medical Assistant) on 02/21/22 at 1029  Med List Status: <None>   Medication Order Taking? Sig Documenting Provider Last Dose Status Informant  ACCU-CHEK FASTCLIX LANCETS [place] [id number] Yes Use to check blood sugar three times a day.  Dx: [id number] Craig Carbon, MD Taking Active Self  ACCU-CHEK SMARTVIEW test strip [id number] Yes USE AS INSTRUCTED TO TEST BLOOD SUGAR THREE TIMES DAILY Craig Carbon, MD Taking Active Self  acetaminophen (TYLENOL) 500 MG tablet [id number] Yes Take 1,000 mg by mouth every 6 (six) hours as needed (pain). [provider] Taking Active Self  amiodarone (PACERONE) 200 MG tablet [id number] Yes TAKE 1 TABLET EVERY DAY Craig Carbon, MD Taking Active  atorvastatin (LIPITOR) 10 MG tablet [id number] Yes TAKE 1 TABLET BY MOUTH DAILY AT 6 PM. Craig Carbon, MD Taking Active   Blood  Pressure Monitor DEVI [id number] Yes  [provider] Taking Active Self  [place] 5-2.5-18.5 LF-MCG/0.5 injection [id number] Yes  [provider] Taking Active   diltiazem (DILACOR XR) 240 MG 24 hr capsule [id number] Yes TAKE 1 CAPSULE EVERY MORNING Craig Carbon, MD Taking Active   furosemide (LASIX) 40 MG tablet [id number] Yes TAKE 1 TABLET EVERY DAY Craig Carbon, MD Taking Active   lisinopril (ZESTRIL) 40 MG tablet [id number] Yes TAKE 1 TABLET EVERY DAY Craig Carbon, MD Taking Active   metoprolol succinate (TOPROL-XL) 100 MG 24 hr tablet [id number] Yes TAKE 2 TABLETS EVERY DAY Craig Carbon, MD Taking Active   Multiple Vitamins-Minerals (BARIATRIC MULTIVITAMINS/IRON PO) [id number] Yes Take 1 tablet by mouth in the morning. [provider] Taking Active Self  rivaroxaban (XARELTO) 20 MG TABS tablet [id number] Yes TAKE 1 TABLET EVERY DAY WITH SUPPER Craig Carbon, MD Taking Active   Semaglutide,0.25 or 0.'5MG'$ /DOS, (OZEMPIC, 0.25 OR 0.5 MG/DOSE,) 2 MG/3ML SOPN [id number] Yes Inject 0.5 mg into the skin once a week. Via [employer] PAP [provider] Taking Active             SDOH:  (Social Determinants of Health) assessments and interventions performed: No SDOH Interventions    Flowsheet Row Chronic Care Management from 03/28/2021 in [facility] Interventions   Food Insecurity Interventions Intervention Not Indicated  Financial Strain Interventions Intervention Not Indicated       Medication Assistance:  [place] denied 2024 due to income too high  Medication Access: Within the past 30 days, how often has patient missed a dose of medication? 0 Is a pillbox or other method used to improve adherence? No  Factors that may affect medication adherence? financial need Are meds synced by current pharmacy? No  Are meds delivered by current pharmacy? Yes  Does patient experience delays in  picking up medications due to transportation concerns? No   [facility] Services Reviewed: Is patient disadvantaged to use [facility]?: Yes  Current Rx insurance plan: [employer] Name and location of Current pharmacy[facility]  [id number] [place] [address] [address]Phone: [phone]: [phone] [address]Phone: [phone]: [phone] [address] [place]Phone: [phone]: [phone] [facility] services reviewed with patient today?: No  Patient requests to transfer care to [facility]?: No  Reason patient declined to change pharmacies: Disadvantaged due to insurance/mail order  Compliance/Adherence/Medication fill history: Care Gaps: Eye exam (due 02/2019) - pt   Star-Rating Drugs: Atorvastatin - PDC Lisinopril - PDC Ozempic - PDC   Assessment/Plan  Diabetes (A1c goal <7%) -Controlled - A1c at goal 6.0% (11/2021) in prediabetic range; A1c peaked 9.1% in 2012; pt has lost 70 lbs on diet over last year with help of Ozempic 0.5 mg, but has gained about 20lbs back so Ozempic was increased to 1 mg a few weeks ago; pt denies side effects with this -s/p bariatric surgery 04/2017 -Current home glucose readings fasting glucose: 95-110 -Current medications: Ozempic 1 mg weekly - Appropriate, Effective, Safe, Accessible Testing  supplies -Medications previously tried: glipizide, Lantus, Novolog Mix, metformin, Januvia -Educated on A1c and blood sugar goals; -Discussed continued Ozempic mainly for weight loss and CV benefits -Reviewed financial concerns - pt did not qualify for Ozempic PAP this year due to finances above threshold for enrollment -Recommended to continue current medication  Hypertension / Heart  Failure (BP goal <130/80) -Controlled - home BP at goal per pt report; pt denies s/sx of hypotension; he does endorse 70 lb wt loss over past year -Last ejection fraction: 55-60% (Date: 03/2017) -HF type: HFpEF (EF > 50%) -Current home BP readings: 120/60s -Current treatment: Diltiazem XR 240 mg daily - Appropriate, Effective, Safe, Accessible Furosemide 40 mg daily -Appropriate, Effective, Safe, Accessible Lisinopril 40 mg daily -Appropriate, Effective, Safe, Accessible Metoprolol succinate 100 - 2 tab daily -Appropriate, Effective, Safe, Accessible -Medications previously tried: n/a  -Educated on BP goals and benefits of medications for prevention of heart attack, stroke and kidney damage; Importance of home blood pressure monitoring; Symptoms of hypotension and importance of maintaining adequate hydration; -Counseled to monitor BP at home daily, document, and provide log at future appointments -Recommended to continue current medication; pt to monitor BP closely with continued wt loss  Atrial Fibrillation (Goal: prevent stroke and major bleeding) -Controlled - per pt report and cardiology notes; pt uses [employer] program for Xarelto cost assistance in donut hole -CHADSVASC: 5; s/p ICD -Current treatment: Amiodarone 200 mg daily -Appropriate, Effective, Safe, Accessible Diltiazem XR 240 mg daily -Appropriate, Effective, Safe, Accessible Metoprolol succinate 100 mg - 2 tab daily -Appropriate, Effective, Safe, Accessible Xarelto 20 mg daily -Appropriate, Effective, Safe, Accessible -Medications previously tried: n/a -Counseled on increased risk of stroke due to Afib and benefits of anticoagulation for stroke prevention; importance of adherence to anticoagulant exactly as prescribed; -GFR is near cutoff for Xarelto dose reduction (50 ml/min), if GFR drops below 50 Xarelto should be reduced to 15 mg -Recommend to continue current medication  Hyperlipidemia: (LDL goal < 70) -Controlled - LDL 63  (05/2021) at goal; pt has no history of ASCVD -Current treatment: Atorvastatin 10 mg daily - Appropriate, Effective, Safe, Accessible -Medications previously tried: n/a  -Educated on Cholesterol goals; Benefits of statin for ASCVD risk reduction; -Recommended to continue current medication  Health Maintenance -Vaccine gaps: Shingrix, Covid booster -CKD Stage 3a - follows with Craig Holley Raring   Charlene Brooke, PharmD, BCACP Clinical Pharmacist [facility] [phone]

## 2022-03-28 NOTE — Patient Instructions (Signed)
Visit Information  Phone number for Pharmacist: [phone]  Thank you for meeting with me to discuss your medications! Below is a summary of what we talked about during the visit:   Recommendations/Changes made from today's visit: -No med changes  Follow up plan: -Health Concierge will call patient 6 months for general update -Pharmacist follow up PRN -PCP appt 06/13/22 (CPE)   Charlene Brooke, PharmD, BCACP Clinical Pharmacist [facility] at [facility] [phone]

## 2022-03-29 LAB — HM DIABETES EYE EXAM

## 2022-03-31 ENCOUNTER — Ambulatory Visit: Payer: Medicare HMO | Admitting: Physician Assistant

## 2022-03-31 VITALS — BP 144/76 | HR 72 | Ht 74.0 in | Wt 337.0 lb

## 2022-03-31 DIAGNOSIS — N411 Chronic prostatitis: Secondary | ICD-10-CM | POA: Diagnosis not present

## 2022-03-31 DIAGNOSIS — N390 Urinary tract infection, site not specified: Secondary | ICD-10-CM | POA: Diagnosis not present

## 2022-03-31 LAB — MICROSCOPIC EXAMINATION

## 2022-03-31 LAB — URINALYSIS, COMPLETE
Bilirubin, UA: NEGATIVE
Glucose, UA: NEGATIVE
Ketones, UA: NEGATIVE
Leukocytes,UA: NEGATIVE
Nitrite, UA: NEGATIVE
Protein,UA: NEGATIVE
RBC, UA: NEGATIVE
Specific Gravity, UA: 1.015 (ref 1.005–1.030)
Urobilinogen, Ur: 0.2 mg/dL (ref 0.2–1.0)
pH, UA: 5 (ref 5.0–7.5)

## 2022-03-31 LAB — BLADDER SCAN AMB NON-IMAGING: Scan Result: 35

## 2022-03-31 MED ORDER — CEPHALEXIN 500 MG PO CAPS: 500.0000 mg | ORAL_CAPSULE | Freq: Two times a day (BID) | ORAL | 0 refills | Status: AC

## 2022-03-31 MED ORDER — TAMSULOSIN HCL 0.4 MG PO CAPS: 0.4000 mg | ORAL_CAPSULE | Freq: Every day | ORAL | 0 refills | Status: DC

## 2022-03-31 NOTE — Progress Notes (Unsigned)
03/31/2022 3:54 PM   Craig Lindsey 1960/05/11 [id number]  CC: Chief Complaint  Patient presents with   Follow-up   HPI: Craig Lindsey is a 62 y.o. male with PMH OSA, DM 2, and prostatitis who presents today for evaluation of possible UTI.   Today he reports a 2-day history of bladder pressure and dysuria.  He denies gross hematuria, flank pain, fever, chills, nausea, or vomiting.  He notes this is very similar to how his episode of prostatitis started last year.  In-office UA and microscopy pan negative. PVR 10mL.  PMH: Past Medical History:  Diagnosis Date   AICD (automatic cardioverter/defibrillator) present    Arrhythmia 12/2002   1. Ventricular tachycardia; Dr. [name]  2. Atrial fibrillation   Arthritis    knees   Cancer (HCC)    Chronic kidney disease    Complex L renal cyst, no tx for    Diabetes mellitus    Type II   DVT (deep venous thrombosis) ([place]) 07/2007   RLE, Tx Arixtra   Dysrhythmia    v fib and a fib has icd   Hemorrhoids    hx of   Hyperlipidemia    Hypertension    Neuropathy    Oxaliplatin  from cancer treatment   Obesity    Personal history of colon cancer, stage III 01/2007   Dr. Carron Brazen, D. [place]   Sigmoid colon ulcer 5 yrs ago   Sigmoid; DLucia Gaskins, MD (colon cancer)   Sleep apnea    uses cpap setting of [id number]   Umbilical hernia     Surgical History: Past Surgical History:  Procedure Laterality Date   CARDIAC DEFIBRILLATOR PLACEMENT     2004   CHOLECYSTECTOMY     COLECTOMY  1/09   Sigmoid; Nydia Bouton, MD (colon cancer)   COLONOSCOPY     COLONOSCOPY N/A 02/25/2013   Procedure: COLONOSCOPY;  Surgeon: Gatha Mayer, MD;  Location: [facility];  Service: Endoscopy;  Laterality: N/A;   HERNIA REPAIR  yrs ago   umb hernia   ICD GENERATOR CHANGEOUT N/A 04/12/2017   Procedure: [place];  Surgeon: Evans Lance, MD;  Location: [facility];  Service: Cardiovascular;  Laterality: N/A;   INCISIONAL  HERNIA REPAIR N/A 08/12/2020   Procedure: HERNIA REPAIR INCISIONAL with mesh;  Surgeon: Coralie Keens, MD;  Location: [facility];  Service: General;  Laterality: N/A;  LMA   LAPAROSCOPIC GASTRIC SLEEVE RESECTION     Dr. Lucia Gaskins 04-30-17   LAPAROSCOPIC GASTRIC SLEEVE RESECTION N/A 04/30/2017   Procedure: LAPAROSCOPIC GASTRIC SLEEVE RESECTION WITH UPPER ENDO;  Surgeon: Alphonsa Overall, MD;  Location: [facility];  Service: General;  Laterality: N/A;    Home Medications:  Allergies as of 03/31/2022   No Known Allergies      Medication List        Accurate as of March 31, 2022  3:54 PM. If you have any questions, ask your nurse or doctor.          Accu-Chek FastClix Lancets Misc Use to check blood sugar three times a day.  Dx: E11.40   Accu-Chek SmartView test strip Generic drug: glucose blood USE AS INSTRUCTED TO TEST BLOOD SUGAR THREE TIMES DAILY   acetaminophen 500 MG tablet Commonly known as: TYLENOL Take 1,000 mg by mouth every 6 (six) hours as needed (pain).   amiodarone 200 MG tablet Commonly known as: PACERONE TAKE 1 TABLET EVERY DAY   atorvastatin 10 MG tablet Commonly  known as: LIPITOR TAKE 1 TABLET BY MOUTH DAILY AT 6 PM.   BARIATRIC MULTIVITAMINS/IRON PO Take 1 tablet by mouth in the morning.   Blood Pressure Monitor Devi   Boostrix 5-2.5-18.5 LF-MCG/0.5 injection Generic drug: Tdap   cephALEXin 500 MG capsule Commonly known as: Keflex Take 1 capsule (500 mg total) by mouth 2 (two) times daily for 28 days.   diltiazem 240 MG 24 hr capsule Commonly known as: DILACOR XR TAKE 1 CAPSULE EVERY MORNING   furosemide 40 MG tablet Commonly known as: LASIX TAKE 1 TABLET EVERY DAY   lisinopril 40 MG tablet Commonly known as: ZESTRIL TAKE 1 TABLET EVERY DAY   metoprolol succinate 100 MG 24 hr tablet Commonly known as: TOPROL-XL TAKE 2 TABLETS EVERY DAY   rivaroxaban 20 MG Tabs tablet Commonly known as: Xarelto TAKE 1 TABLET EVERY DAY WITH SUPPER   Semaglutide  (1 MG/DOSE) 4 MG/3ML Sopn Inject 1 mg as directed once a week.   tamsulosin 0.4 MG Caps capsule Commonly known as: FLOMAX Take 1 capsule (0.4 mg total) by mouth daily.        Allergies:  No Known Allergies  Family History: Family History  Problem Relation Age of Onset   Diabetes Father    Cancer Father        myelofibrosis   Colon cancer Other    Diabetes Maternal Grandmother    Cancer Maternal Aunt        colon   Hypertension Neg Hx    Coronary artery disease Neg Hx    Prostate cancer Neg Hx    Esophageal cancer Neg Hx    Rectal cancer Neg Hx    Stomach cancer Neg Hx     Social History:   reports that he has never smoked. He has been exposed to tobacco smoke. He has never used smokeless tobacco. He reports that he does not drink alcohol and does not use drugs.  Physical Exam: BP (!) 144/76   Pulse 72   Ht 6\' 2"  (1.88 m)   Wt (!) 337 lb (152.9 kg)   BMI 43.27 kg/m   Constitutional:  Alert and oriented, no acute distress, nontoxic appearing HEENT: [place], AT Cardiovascular: No clubbing, cyanosis, or edema Respiratory: Normal respiratory effort, no increased work of breathing Skin: No rashes, bruises or suspicious lesions Neurologic: Grossly intact, no focal deficits, moving all 4 extremities Psychiatric: Normal mood and affect  Laboratory Data: Results for orders placed or performed in visit on 03/31/22  CULTURE, URINE COMPREHENSIVE   Specimen: Urine   UR  Result Value Ref Range   Urine Culture, Comprehensive Final report    Organism ID, Bacteria Comment   Microscopic Examination   Urine  Result Value Ref Range   WBC, UA 0-5 0 - 5 /hpf   RBC, Urine 0-2 0 - 2 /hpf   Epithelial Cells (non renal) 0-10 0 - 10 /hpf   Casts Present (A) None seen /lpf   Cast Type Hyaline casts N/A   Bacteria, UA Few None seen/Few  Urinalysis, Complete  Result Value Ref Range   Specific Gravity, UA 1.015 1.005 - 1.030   pH, UA 5.0 5.0 - 7.5   Color, UA Yellow Yellow    Appearance Ur Clear Clear   Leukocytes,UA Negative Negative   Protein,UA Negative Negative/Trace   Glucose, UA Negative Negative   Ketones, UA Negative Negative   RBC, UA Negative Negative   Bilirubin, UA Negative Negative   Urobilinogen, Ur 0.2 0.2 - 1.0  mg/dL   Nitrite, UA Negative Negative   Microscopic Examination See below:   BLADDER SCAN AMB NON-IMAGING  Result Value Ref Range   Scan Result 35 ml    Assessment & Plan:   1. Chronic prostatitis Acute onset irritative voiding symptoms with bland UA.  He is emptying appropriately.  With similar symptoms in the past leading to an episode of bacterial prostatitis, recommend more aggressive treatment.  Will start 1 month of Flomax and Keflex and send urine for culture today.  Counseled him to follow-up if symptoms do not resolve. - Urinalysis, Complete - BLADDER SCAN AMB NON-IMAGING - cephALEXin (KEFLEX) 500 MG capsule; Take 1 capsule (500 mg total) by mouth 2 (two) times daily for 28 days.  Dispense: 56 capsule; Refill: 0 - CULTURE, URINE COMPREHENSIVE - tamsulosin (FLOMAX) 0.4 MG CAPS capsule; Take 1 capsule (0.4 mg total) by mouth daily.  Dispense: 30 capsule; Refill: 0  Return if symptoms worsen or fail to improve.  Debroah Loop, PA-C  [facility] [address] [place], [place] [address] [phone]

## 2022-04-03 LAB — CULTURE, URINE COMPREHENSIVE

## 2022-04-22 ENCOUNTER — Other Ambulatory Visit: Payer: Self-pay | Admitting: Physician Assistant

## 2022-04-22 DIAGNOSIS — N411 Chronic prostatitis: Secondary | ICD-10-CM

## 2022-04-27 ENCOUNTER — Ambulatory Visit (INDEPENDENT_AMBULATORY_CARE_PROVIDER_SITE_OTHER): Payer: Medicare HMO

## 2022-04-27 DIAGNOSIS — I472 Ventricular tachycardia, unspecified: Secondary | ICD-10-CM | POA: Diagnosis not present

## 2022-04-27 LAB — CUP PACEART REMOTE DEVICE CHECK
Battery Remaining Longevity: 74 mo
Battery Voltage: 3 V
Brady Statistic RV Percent Paced: 0.29 %
Date Time Interrogation Session: 20240411012509
HighPow Impedance: 51 Ohm
HighPow Impedance: 69 Ohm
Implantable Lead Connection Status: 753985
Implantable Lead Implant Date: 20050113
Implantable Lead Location: 753860
Implantable Lead Model: 6949
Implantable Pulse Generator Implant Date: 20190328
Lead Channel Impedance Value: 399 Ohm
Lead Channel Impedance Value: 532 Ohm
Lead Channel Pacing Threshold Amplitude: 1.375 V
Lead Channel Pacing Threshold Pulse Width: 0.4 ms
Lead Channel Sensing Intrinsic Amplitude: 11 mV
Lead Channel Sensing Intrinsic Amplitude: 11 mV
Lead Channel Setting Pacing Amplitude: 3.25 V
Lead Channel Setting Pacing Pulse Width: 0.4 ms
Lead Channel Setting Sensing Sensitivity: 0.3 mV
Zone Setting Status: 755011
Zone Setting Status: 755011

## 2022-06-02 NOTE — Progress Notes (Signed)
Remote ICD transmission.   

## 2022-06-13 ENCOUNTER — Encounter: Payer: Medicare HMO | Admitting: Internal Medicine

## 2022-07-06 ENCOUNTER — Encounter: Payer: Self-pay | Admitting: Internal Medicine

## 2022-07-06 ENCOUNTER — Ambulatory Visit (INDEPENDENT_AMBULATORY_CARE_PROVIDER_SITE_OTHER): Payer: Medicare HMO | Admitting: Internal Medicine

## 2022-07-06 VITALS — BP 118/74 | HR 65 | Temp 97.6°F | Ht 73.0 in | Wt 351.0 lb

## 2022-07-06 DIAGNOSIS — E1140 Type 2 diabetes mellitus with diabetic neuropathy, unspecified: Secondary | ICD-10-CM | POA: Diagnosis not present

## 2022-07-06 DIAGNOSIS — Z125 Encounter for screening for malignant neoplasm of prostate: Secondary | ICD-10-CM

## 2022-07-06 DIAGNOSIS — Z0000 Encounter for general adult medical examination without abnormal findings: Secondary | ICD-10-CM

## 2022-07-06 DIAGNOSIS — Z6841 Body Mass Index (BMI) 40.0 and over, adult: Secondary | ICD-10-CM

## 2022-07-06 DIAGNOSIS — I5032 Chronic diastolic (congestive) heart failure: Secondary | ICD-10-CM

## 2022-07-06 DIAGNOSIS — N1831 Chronic kidney disease, stage 3a: Secondary | ICD-10-CM | POA: Diagnosis not present

## 2022-07-06 DIAGNOSIS — Z7985 Long-term (current) use of injectable non-insulin antidiabetic drugs: Secondary | ICD-10-CM | POA: Diagnosis not present

## 2022-07-06 DIAGNOSIS — D696 Thrombocytopenia, unspecified: Secondary | ICD-10-CM

## 2022-07-06 DIAGNOSIS — I480 Paroxysmal atrial fibrillation: Secondary | ICD-10-CM

## 2022-07-06 LAB — RENAL FUNCTION PANEL
Albumin: 4.4 g/dL (ref 3.5–5.2)
BUN: 30 mg/dL — ABNORMAL HIGH (ref 6–23)
CO2: 25 mEq/L (ref 19–32)
Calcium: 9.4 mg/dL (ref 8.4–10.5)
Chloride: 105 mEq/L (ref 96–112)
Creatinine, Ser: 1.46 mg/dL (ref 0.40–1.50)
GFR: 51.35 mL/min — ABNORMAL LOW (ref >60.00)
Glucose, Bld: 145 mg/dL — ABNORMAL HIGH (ref 70–99)
Phosphorus: 3.1 mg/dL (ref 2.3–4.6)
Potassium: 5.1 mEq/L (ref 3.5–5.1)
Sodium: 141 mEq/L (ref 135–145)

## 2022-07-06 LAB — HEMOGLOBIN A1C: Hgb A1c MFr Bld: 6 % (ref 4.6–6.5)

## 2022-07-06 LAB — HEPATIC FUNCTION PANEL
ALT: 23 U/L (ref 0–53)
AST: 17 U/L (ref 0–37)
Albumin: 4.4 g/dL (ref 3.5–5.2)
Alkaline Phosphatase: 67 U/L (ref 39–117)
Bilirubin, Direct: 0.2 mg/dL (ref 0.0–0.3)
Total Bilirubin: 0.8 mg/dL (ref 0.2–1.2)
Total Protein: 7.7 g/dL (ref 6.0–8.3)

## 2022-07-06 LAB — LIPID PANEL
Cholesterol: 127 mg/dL (ref 0–200)
HDL: 34.7 mg/dL — ABNORMAL LOW (ref >39.00)
LDL Cholesterol: 65 mg/dL (ref 0–99)
NonHDL: 92.37
Total CHOL/HDL Ratio: 4
Triglycerides: 139 mg/dL (ref 0.0–149.0)
VLDL: 27.8 mg/dL (ref 0.0–40.0)

## 2022-07-06 LAB — CBC
HCT: 51.3 % (ref 39.0–52.0)
Hemoglobin: 16.9 g/dL (ref 13.0–17.0)
MCHC: 33 g/dL (ref 30.0–36.0)
MCV: 94.7 fl (ref 78.0–100.0)
Platelets: 146 10*3/uL — ABNORMAL LOW (ref 150.0–400.0)
RBC: 5.42 Mil/uL (ref 4.22–5.81)
RDW: 13.5 % (ref 11.5–15.5)
WBC: 7.2 10*3/uL (ref 4.0–10.5)

## 2022-07-06 LAB — HM DIABETES FOOT EXAM

## 2022-07-06 LAB — PSA, MEDICARE: PSA: 1.02 ng/ml (ref 0.10–4.00)

## 2022-07-06 LAB — MICROALBUMIN / CREATININE URINE RATIO
Creatinine,U: 97.1 mg/dL
Microalb Creat Ratio: 4 mg/g (ref 0.0–30.0)
Microalb, Ur: 3.9 mg/dL — ABNORMAL HIGH (ref 0.0–1.9)

## 2022-07-06 MED ORDER — SEMAGLUTIDE (2 MG/DOSE) 8 MG/3ML ~~LOC~~ SOPN: 2.0000 mg | PEN_INJECTOR | SUBCUTANEOUS | 3 refills | Status: DC

## 2022-07-06 NOTE — Assessment & Plan Note (Signed)
Seems to still have good control but will increase the ozempic to 2mg  weekly If money issues--would switch to farxiga/jardiance

## 2022-07-06 NOTE — Progress Notes (Signed)
Hearing Screening - Comments:: Passed whisper test Vision Screening - Comments:: March 2024  

## 2022-07-06 NOTE — Assessment & Plan Note (Signed)
Has been stable for some time Is on the lisinopril

## 2022-07-06 NOTE — Assessment & Plan Note (Signed)
Needs to get back to the gym Will increase the ozempic to 2mg  weekly

## 2022-07-06 NOTE — Progress Notes (Signed)
Subjective:    Patient ID: Craig Lindsey, male    DOB: 1960-03-24, 62 y.o.   MRN: [id number]  HPI Here for Medicare wellness visit and follow up of chronic health conditions Reviewed advanced directives Reviewed other doctors--Dr Sninsky--urology, Dr Pilar Jarvis cardiology, Dr Lateef--nephrology, Dr Tillery--cardiology Dr Serita Grit, [facility], Dr Brennan Bailey No hospitalizations or surgery in the past year Vision is okay Hearing is fine No alcohol or tobacco Had been going to gym regularly---then stopped ~6 weeks ago. Discussed No falls  No depression or anhedonia Independent with instrumental ADLs Mild memory issues--nothing worrisome  Has gained back 15-20# Doesn't feel the ozempic affected his appetite much BMI back up to 46 Checks sugars--- 120's generally fasting No GI side effects Chronic foot numbness--some pain in right foot at times  Last GFR 54 Has generally been in the 50's Hasn't seen nephrologist in a while  Urinary symptoms are better Voids pretty well---just some dribbling Empties okay Never took the tamsulosin  No palpitations--doesn't think he goes into atrial fib No chest pain or SOB Chronic edema right calf--actually better (and has stasis changes) ICD functioning okay Sleeps slightly propped---no PND No dizziness or syncope  Current Outpatient Medications on File Prior to Visit  Medication Sig Dispense Refill   ACCU-CHEK FASTCLIX LANCETS MISC Use to check blood sugar three times a day.  Dx: E11.40 306 each 3   ACCU-CHEK SMARTVIEW test strip USE AS INSTRUCTED TO TEST BLOOD SUGAR THREE TIMES DAILY 300 each 12   acetaminophen (TYLENOL) 500 MG tablet Take 1,000 mg by mouth every 6 (six) hours as needed (pain).     amiodarone (PACERONE) 200 MG tablet TAKE 1 TABLET EVERY DAY 90 tablet 3   atorvastatin (LIPITOR) 10 MG tablet TAKE 1 TABLET BY MOUTH DAILY AT 6 PM. 90 tablet 3   Blood Pressure Monitor DEVI      diltiazem (DILACOR XR) 240 MG 24 hr  capsule TAKE 1 CAPSULE EVERY MORNING 90 capsule 3   furosemide (LASIX) 40 MG tablet TAKE 1 TABLET EVERY DAY 90 tablet 3   lisinopril (ZESTRIL) 40 MG tablet TAKE 1 TABLET EVERY DAY 90 tablet 3   metoprolol succinate (TOPROL-XL) 100 MG 24 hr tablet TAKE 2 TABLETS EVERY DAY 180 tablet 3   Multiple Vitamins-Minerals (BARIATRIC MULTIVITAMINS/IRON PO) Take 1 tablet by mouth in the morning.     rivaroxaban (XARELTO) 20 MG TABS tablet TAKE 1 TABLET EVERY DAY WITH SUPPER 90 tablet 3   Semaglutide, 1 MG/DOSE, 4 MG/3ML SOPN Inject 1 mg as directed once a week. 12 mL 3   No current facility-administered medications on file prior to visit.    No Known Allergies  Past Medical History:  Diagnosis Date   AICD (automatic cardioverter/defibrillator) present    Arrhythmia 12/2002   1. Ventricular tachycardia; Dr. Ladona Ridgel  2. Atrial fibrillation   Arthritis    knees   Cancer (HCC)    Chronic kidney disease    Complex L renal cyst, no tx for    Diabetes mellitus    Type II   DVT (deep venous thrombosis) (HCC) 07/2007   RLE, Tx Arixtra   Dysrhythmia    v fib and a fib has icd   Hemorrhoids    hx of   Hyperlipidemia    Hypertension    Neuropathy    Oxaliplatin  from cancer treatment   Obesity    Personal history of colon cancer, stage III 01/2007   Dr. Verdis Frederickson, D. [facility]  Sigmoid colon ulcer 5 yrs ago   Sigmoid; Raelyn Mora, MD (colon cancer)   Sleep apnea    uses cpap setting of 11.2   Umbilical hernia     Past Surgical History:  Procedure Laterality Date   CARDIAC DEFIBRILLATOR PLACEMENT     2004   CHOLECYSTECTOMY     COLECTOMY  1/09   Sigmoid; Raelyn Mora, MD (colon cancer)   COLONOSCOPY     COLONOSCOPY N/A 02/25/2013   Procedure: COLONOSCOPY;  Surgeon: Iva Boop, MD;  Location: [facility];  Service: Endoscopy;  Laterality: N/A;   HERNIA REPAIR  yrs ago   umb hernia   ICD GENERATOR CHANGEOUT N/A 04/12/2017   Procedure: ICD GENERATOR CHANGEOUT;  Surgeon: Marinus Maw, MD;  Location: [facility] INVASIVE CV LAB;  Service: Cardiovascular;  Laterality: N/A;   INCISIONAL HERNIA REPAIR N/A 08/12/2020   Procedure: HERNIA REPAIR INCISIONAL with mesh;  Surgeon: Abigail Miyamoto, MD;  Location: [facility] OR;  Service: General;  Laterality: N/A;  LMA   LAPAROSCOPIC GASTRIC SLEEVE RESECTION     Dr. Ezzard Standing 04-30-17   LAPAROSCOPIC GASTRIC SLEEVE RESECTION N/A 04/30/2017   Procedure: LAPAROSCOPIC GASTRIC SLEEVE RESECTION WITH UPPER ENDO;  Surgeon: Ovidio Kin, MD;  Location: [facility] ORS;  Service: General;  Laterality: N/A;    Family History  Problem Relation Age of Onset   Diabetes Father    Cancer Father        myelofibrosis   Colon cancer Other    Diabetes Maternal Grandmother    Cancer Maternal Aunt        colon   Hypertension Neg Hx    Coronary artery disease Neg Hx    Prostate cancer Neg Hx    Esophageal cancer Neg Hx    Rectal cancer Neg Hx    Stomach cancer Neg Hx     Social History   Socioeconomic History   Marital status: Married    Spouse name: Not on file   Number of children: 2   Years of education: Not on file   Highest education level: Not on file  Occupational History   Occupation: Runner, broadcasting/film/video and block sales    Comment: Disabled  Tobacco Use   Smoking status: Never    Passive exposure: Past   Smokeless tobacco: Never  Vaping Use   Vaping Use: Never used  Substance and Sexual Activity   Alcohol use: No    Alcohol/week: 0.0 standard drinks of alcohol   Drug use: No   Sexual activity: Yes  Other Topics Concern   Not on file  Social History Narrative   2 step children from wife's 2nd marriage.      Has living will   Wife is health care POA---alternate would be sister, Craig Lindsey   Would accept resuscitation attempts   Not sure about tube feedings--but not excited about it    Social Determinants of Health   Financial Resource Strain: Low Risk  (03/28/2021)   Overall Financial Resource Strain (CARDIA)    Difficulty of Paying Living Expenses:  Not very hard  Food Insecurity: No Food Insecurity (03/28/2021)   Hunger Vital Sign    Worried About Running Out of Food in the Last Year: Never true    Ran Out of Food in the Last Year: Never true  Transportation Needs: Not on file  Physical Activity: Not on file  Stress: Not on file  Social Connections: Not on file  Intimate Partner Violence: Not on file   Review of Systems  Sleeps well --uses CPAP every night Wears seat belt Teeth okay---keeps up with dentist No heartburn or dysphagia Bowels move fine--no blood No suspicious skin lesions now No sig back or joint pains    Objective:   Physical Exam Constitutional:      Appearance: Normal appearance. He is obese.  HENT:     Mouth/Throat:     Pharynx: No oropharyngeal exudate or posterior oropharyngeal erythema.  Eyes:     Conjunctiva/sclera: Conjunctivae normal.     Pupils: Pupils are equal, round, and reactive to light.  Cardiovascular:     Rate and Rhythm: Normal rate and regular rhythm.     Pulses: Normal pulses.     Heart sounds: No murmur heard.    No gallop.  Pulmonary:     Effort: Pulmonary effort is normal.     Breath sounds: Normal breath sounds. No wheezing or rales.  Abdominal:     Palpations: Abdomen is soft.     Tenderness: There is no abdominal tenderness.  Musculoskeletal:     Cervical back: Neck supple.     Right lower leg: No edema.     Left lower leg: No edema.  Lymphadenopathy:     Cervical: No cervical adenopathy.  Skin:    Findings: No lesion or rash.     Comments: No foot lesions Stasis changes both calves  Neurological:     General: No focal deficit present.     Mental Status: He is alert and oriented to person, place, and time.     Comments: Word naming-- 9/1 minute Recall 1/3 Slightly decreased sensation in feet  Psychiatric:        Mood and Affect: Mood normal.        Behavior: Behavior normal.            Assessment & Plan:

## 2022-07-06 NOTE — Assessment & Plan Note (Signed)
Has been controlled with amiodarone 200 daily On xarelto 20 daily

## 2022-07-06 NOTE — Assessment & Plan Note (Signed)
Mildly low platelet count again Easy brusing probably more from [employer]

## 2022-07-06 NOTE — Assessment & Plan Note (Signed)
Compensated  On lisinopril 40, metoprolol 200 daily, furosemide 40 daily

## 2022-07-06 NOTE — Assessment & Plan Note (Signed)
I have personally reviewed the Medicare Annual Wellness questionnaire and have noted 1. The patient's medical and social history 2. Their use of alcohol, tobacco or illicit drugs 3. Their current medications and supplements 4. The patient's functional ability including ADL's, fall risks, home safety risks and hearing or visual             impairment. 5. Diet and physical activities 6. Evidence for depression or mood disorders  The patients weight, height, BMI and visual acuity have been recorded in the chart I have made referrals, counseling and provided education to the patient based review of the above and I have provided the pt with a written personalized care plan for preventive services.  I have provided you with a copy of your personalized plan for preventive services. Please take the time to review along with your updated medication list.  Colon due 2025 Will check PSA Updated flu/COVID in the fall--and consider RSV Needs to get back to exercise

## 2022-07-27 ENCOUNTER — Ambulatory Visit (INDEPENDENT_AMBULATORY_CARE_PROVIDER_SITE_OTHER): Payer: Medicare HMO

## 2022-07-27 DIAGNOSIS — I472 Ventricular tachycardia, unspecified: Secondary | ICD-10-CM

## 2022-07-27 LAB — CUP PACEART REMOTE DEVICE CHECK
Battery Remaining Longevity: 71 mo
Battery Voltage: 2.99 V
Brady Statistic RV Percent Paced: 1.37 %
Date Time Interrogation Session: 20240711022828
HighPow Impedance: 57 Ohm
HighPow Impedance: 77 Ohm
Implantable Lead Connection Status: 753985
Implantable Lead Implant Date: 20050113
Implantable Lead Location: 753860
Implantable Lead Model: 6949
Implantable Pulse Generator Implant Date: 20190328
Lead Channel Impedance Value: 399 Ohm
Lead Channel Impedance Value: 551 Ohm
Lead Channel Pacing Threshold Amplitude: 1.375 V
Lead Channel Pacing Threshold Pulse Width: 0.4 ms
Lead Channel Sensing Intrinsic Amplitude: 11.125 mV
Lead Channel Sensing Intrinsic Amplitude: 11.125 mV
Lead Channel Setting Pacing Amplitude: 2.75 V
Lead Channel Setting Pacing Pulse Width: 0.4 ms
Lead Channel Setting Sensing Sensitivity: 0.3 mV
Zone Setting Status: 755011
Zone Setting Status: 755011

## 2022-08-15 NOTE — Progress Notes (Signed)
Remote ICD transmission.   

## 2022-10-04 ENCOUNTER — Other Ambulatory Visit: Payer: Self-pay | Admitting: Internal Medicine

## 2022-10-04 DIAGNOSIS — I5032 Chronic diastolic (congestive) heart failure: Secondary | ICD-10-CM

## 2022-10-26 ENCOUNTER — Ambulatory Visit (INDEPENDENT_AMBULATORY_CARE_PROVIDER_SITE_OTHER): Payer: Medicare HMO

## 2022-10-26 DIAGNOSIS — I472 Ventricular tachycardia, unspecified: Secondary | ICD-10-CM | POA: Diagnosis not present

## 2022-10-26 LAB — CUP PACEART REMOTE DEVICE CHECK
Battery Remaining Longevity: 65 mo
Battery Voltage: 2.99 V
Brady Statistic RV Percent Paced: 2.32 %
Date Time Interrogation Session: 20241010001705
HighPow Impedance: 57 Ohm
HighPow Impedance: 70 Ohm
Implantable Lead Connection Status: 753985
Implantable Lead Implant Date: 20050113
Implantable Lead Location: 753860
Implantable Lead Model: 6949
Implantable Pulse Generator Implant Date: 20190328
Lead Channel Impedance Value: 361 Ohm
Lead Channel Impedance Value: 532 Ohm
Lead Channel Pacing Threshold Amplitude: 1.25 V
Lead Channel Pacing Threshold Pulse Width: 0.4 ms
Lead Channel Sensing Intrinsic Amplitude: 9.5 mV
Lead Channel Sensing Intrinsic Amplitude: 9.5 mV
Lead Channel Setting Pacing Amplitude: 2.5 V
Lead Channel Setting Pacing Pulse Width: 0.4 ms
Lead Channel Setting Sensing Sensitivity: 0.3 mV
Zone Setting Status: 755011
Zone Setting Status: 755011

## 2022-11-07 NOTE — Progress Notes (Signed)
Remote ICD transmission.   

## 2022-11-16 ENCOUNTER — Encounter (HOSPITAL_COMMUNITY): Payer: Self-pay | Admitting: *Deleted

## 2022-12-08 ENCOUNTER — Encounter: Payer: Self-pay | Admitting: Internal Medicine

## 2022-12-11 ENCOUNTER — Encounter: Payer: Self-pay | Admitting: Internal Medicine

## 2022-12-11 ENCOUNTER — Telehealth (INDEPENDENT_AMBULATORY_CARE_PROVIDER_SITE_OTHER): Payer: Medicare HMO | Admitting: Internal Medicine

## 2022-12-11 DIAGNOSIS — G4733 Obstructive sleep apnea (adult) (pediatric): Secondary | ICD-10-CM

## 2022-12-11 NOTE — Progress Notes (Signed)
Subjective:    Patient ID: Craig Lindsey, male    DOB: 1960/10/20, 62 y.o.   MRN: [id number]  HPI Video virtual visit to review his sleep apnea Identification done Reviewed limitations and billing and he gave consent Participants---patient in his home and I am in my office  He is doing well on his CPAP Now using this for 20 years Uses it every night---and he does well with this (has done poorly when he doesn't use it--like falling asleep on the couch, he will awaken choking) Uses humidifier --otherwise he has dry mouth Uses full face mask  Current Outpatient Medications on File Prior to Visit  Medication Sig Dispense Refill   ACCU-CHEK FASTCLIX LANCETS MISC Use to check blood sugar three times a day.  Dx: E11.40 306 each 3   ACCU-CHEK SMARTVIEW test strip USE AS INSTRUCTED TO TEST BLOOD SUGAR THREE TIMES DAILY 300 each 12   acetaminophen (TYLENOL) 500 MG tablet Take 1,000 mg by mouth every 6 (six) hours as needed (pain).     amiodarone (PACERONE) 200 MG tablet TAKE 1 TABLET EVERY DAY 90 tablet 3   atorvastatin (LIPITOR) 10 MG tablet TAKE 1 TABLET BY MOUTH DAILY AT 6 PM. 90 tablet 3   Blood Pressure Monitor DEVI      diltiazem (DILACOR XR) 240 MG 24 hr capsule TAKE 1 CAPSULE EVERY MORNING 90 capsule 3   furosemide (LASIX) 40 MG tablet TAKE 1 TABLET EVERY DAY 90 tablet 3   lisinopril (ZESTRIL) 40 MG tablet TAKE 1 TABLET EVERY DAY 90 tablet 3   metoprolol succinate (TOPROL-XL) 100 MG 24 hr tablet TAKE 2 TABLETS EVERY DAY 180 tablet 3   Multiple Vitamins-Minerals (BARIATRIC MULTIVITAMINS/IRON PO) Take 1 tablet by mouth in the morning.     rivaroxaban (XARELTO) 20 MG TABS tablet TAKE 1 TABLET EVERY DAY WITH SUPPER 90 tablet 3   Semaglutide, 2 MG/DOSE, 8 MG/3ML SOPN Inject 2 mg as directed once a week. 9 mL 3   No current facility-administered medications on file prior to visit.    No Known Allergies  Past Medical History:  Diagnosis Date   AICD (automatic  cardioverter/defibrillator) present    Arrhythmia 12/2002   1. Ventricular tachycardia; Dr. Ladona Ridgel  2. Atrial fibrillation   Arthritis    knees   Cancer (HCC)    Chronic kidney disease    Complex L renal cyst, no tx for    Diabetes mellitus    Type II   DVT (deep venous thrombosis) (HCC) 07/2007   RLE, Tx Arixtra   Dysrhythmia    v fib and a fib has icd   Hemorrhoids    hx of   Hyperlipidemia    Hypertension    Neuropathy    Oxaliplatin  from cancer treatment   Obesity    Personal history of colon cancer, stage III 01/2007   Dr. Verdis Frederickson, D. [facility]   Sigmoid colon ulcer 5 yrs ago   Sigmoid; DEzzard Standing, MD (colon cancer)   Sleep apnea    uses cpap setting of 11.2   Umbilical hernia     Past Surgical History:  Procedure Laterality Date   CARDIAC DEFIBRILLATOR PLACEMENT     2004   CHOLECYSTECTOMY     COLECTOMY  1/09   Sigmoid; Raelyn Mora, MD (colon cancer)   COLONOSCOPY     COLONOSCOPY N/A 02/25/2013   Procedure: COLONOSCOPY;  Surgeon: Iva Boop, MD;  Location: [facility];  Service: Endoscopy;  Laterality: N/A;  HERNIA REPAIR  yrs ago   umb hernia   ICD GENERATOR CHANGEOUT N/A 04/12/2017   Procedure: ICD GENERATOR CHANGEOUT;  Surgeon: Marinus Maw, MD;  Location: [facility] INVASIVE CV LAB;  Service: Cardiovascular;  Laterality: N/A;   INCISIONAL HERNIA REPAIR N/A 08/12/2020   Procedure: HERNIA REPAIR INCISIONAL with mesh;  Surgeon: Abigail Miyamoto, MD;  Location: [facility] OR;  Service: General;  Laterality: N/A;  LMA   LAPAROSCOPIC GASTRIC SLEEVE RESECTION     Dr. Ezzard Standing 04-30-17   LAPAROSCOPIC GASTRIC SLEEVE RESECTION N/A 04/30/2017   Procedure: LAPAROSCOPIC GASTRIC SLEEVE RESECTION WITH UPPER ENDO;  Surgeon: Ovidio Kin, MD;  Location: [facility] ORS;  Service: General;  Laterality: N/A;    Family History  Problem Relation Age of Onset   Diabetes Father    Cancer Father        myelofibrosis   Colon cancer Other    Diabetes Maternal Grandmother    Cancer Maternal  Aunt        colon   Hypertension Neg Hx    Coronary artery disease Neg Hx    Prostate cancer Neg Hx    Esophageal cancer Neg Hx    Rectal cancer Neg Hx    Stomach cancer Neg Hx     Social History   Socioeconomic History   Marital status: Married    Spouse name: Not on file   Number of children: 2   Years of education: Not on file   Highest education level: Not on file  Occupational History   Occupation: Runner, broadcasting/film/video and block sales    Comment: Disabled  Tobacco Use   Smoking status: Never    Passive exposure: Past   Smokeless tobacco: Never  Vaping Use   Vaping status: Never Used  Substance and Sexual Activity   Alcohol use: No    Alcohol/week: 0.0 standard drinks of alcohol   Drug use: No   Sexual activity: Yes  Other Topics Concern   Not on file  Social History Narrative   2 step children from wife's 2nd marriage.      Has living will   Wife is health care POA---alternate would be sister, Fredderick Phenix   Would accept resuscitation attempts   Not sure about tube feedings--but not excited about it    Social Determinants of Health   Financial Resource Strain: Low Risk  (03/28/2021)   Overall Financial Resource Strain (CARDIA)    Difficulty of Paying Living Expenses: Not very hard  Food Insecurity: No Food Insecurity (03/28/2021)   Hunger Vital Sign    Worried About Running Out of Food in the Last Year: Never true    Ran Out of Food in the Last Year: Never true  Transportation Needs: Not on file  Physical Activity: Not on file  Stress: Not on file  Social Connections: Not on file  Intimate Partner Violence: Not on file   Review of Systems     Objective:   Physical Exam Constitutional:      Appearance: Normal appearance.  Pulmonary:     Effort: Pulmonary effort is normal. No respiratory distress.  Neurological:     Mental Status: He is alert.  Psychiatric:        Mood and Affect: Mood normal.        Behavior: Behavior normal.            Assessment &  Plan:

## 2022-12-11 NOTE — Assessment & Plan Note (Signed)
He uses the CPAP nightly and benefits from it tremendously Awakens choking if he forgets it Rests well with it Machine now blinking "end of life"  He uses 10cm water---will do autotitrate 5-15 Rx written to [facility]

## 2022-12-18 ENCOUNTER — Other Ambulatory Visit: Payer: Self-pay | Admitting: Internal Medicine

## 2023-01-04 ENCOUNTER — Ambulatory Visit: Payer: Medicare HMO | Admitting: Internal Medicine

## 2023-01-05 ENCOUNTER — Ambulatory Visit: Payer: Medicare HMO | Admitting: Internal Medicine

## 2023-01-25 ENCOUNTER — Ambulatory Visit (INDEPENDENT_AMBULATORY_CARE_PROVIDER_SITE_OTHER): Payer: Medicare HMO

## 2023-01-25 DIAGNOSIS — I472 Ventricular tachycardia, unspecified: Secondary | ICD-10-CM

## 2023-01-25 LAB — CUP PACEART REMOTE DEVICE CHECK
Battery Remaining Longevity: 60 mo
Battery Voltage: 2.98 V
Brady Statistic RV Percent Paced: 2.41 %
Date Time Interrogation Session: 20250109033425
HighPow Impedance: 61 Ohm
HighPow Impedance: 77 Ohm
Implantable Lead Connection Status: 753985
Implantable Lead Implant Date: 20050113
Implantable Lead Location: 753860
Implantable Lead Model: 6949
Implantable Pulse Generator Implant Date: 20190328
Lead Channel Impedance Value: 418 Ohm
Lead Channel Impedance Value: 589 Ohm
Lead Channel Pacing Threshold Amplitude: 1.125 V
Lead Channel Pacing Threshold Pulse Width: 0.4 ms
Lead Channel Sensing Intrinsic Amplitude: 10.25 mV
Lead Channel Sensing Intrinsic Amplitude: 10.25 mV
Lead Channel Setting Pacing Amplitude: 2.75 V
Lead Channel Setting Pacing Pulse Width: 0.4 ms
Lead Channel Setting Sensing Sensitivity: 0.3 mV
Zone Setting Status: 755011
Zone Setting Status: 755011

## 2023-01-30 ENCOUNTER — Other Ambulatory Visit: Payer: Self-pay | Admitting: Internal Medicine

## 2023-02-07 ENCOUNTER — Ambulatory Visit
Admission: RE | Admit: 2023-02-07 | Discharge: 2023-02-07 | Disposition: A | Discharge status: Home / Self Care | Payer: Medicare HMO | Source: Ambulatory Visit | Attending: Emergency Medicine | Admitting: Emergency Medicine

## 2023-02-07 VITALS — BP 125/85 | HR 107 | Temp 99.0°F | Resp 20

## 2023-02-07 DIAGNOSIS — J069 Acute upper respiratory infection, unspecified: Secondary | ICD-10-CM

## 2023-02-07 DIAGNOSIS — R062 Wheezing: Secondary | ICD-10-CM

## 2023-02-07 MED ORDER — PROMETHAZINE-DM 6.25-15 MG/5ML PO SYRP: 5.0000 mL | ORAL_SOLUTION | Freq: Every evening | ORAL | 0 refills | Status: DC | PRN

## 2023-02-07 MED ORDER — AZITHROMYCIN 250 MG PO TABS: 250.0000 mg | ORAL_TABLET | Freq: Every day | ORAL | 0 refills | Status: DC

## 2023-02-07 MED ORDER — PREDNISONE 10 MG (21) PO TBPK: ORAL_TABLET | Freq: Every day | ORAL | 0 refills | Status: DC

## 2023-02-07 MED ORDER — BENZONATATE 100 MG PO CAPS
100.0000 mg | ORAL_CAPSULE | Freq: Three times a day (TID) | ORAL | 0 refills | Status: DC
Start: 2023-02-07 — End: 2023-10-08

## 2023-02-07 NOTE — ED Provider Notes (Signed)
Craig Lindsey    CSN: [id number] Arrival date & time: 02/07/23  1101      History   Chief Complaint Chief Complaint  Patient presents with   Cough    Runny nose - Entered by patient    HPI Craig Lindsey is a 62 y.o. male.   Patient presents for evaluation of nasal congestion, rhinorrhea, productive cough with brown sputum, shortness of breath and wheezing present with coughing and intermittent headaches for 6 days.  Symptoms worsening at nighttime when having to lay flat, improves with sitting up.  Known exposure to RSV.5 tolerating food and liquids.  Has attempted use of Mucinex DM.  Denies fever.  Denies respiratory history, non-smoker.  Past Medical History:  Diagnosis Date   AICD (automatic cardioverter/defibrillator) present    Arrhythmia 12/2002   1. Ventricular tachycardia; Dr. Ladona Ridgel  2. Atrial fibrillation   Arthritis    knees   Cancer (HCC)    Chronic kidney disease    Complex L renal cyst, no tx for    Diabetes mellitus    Type II   DVT (deep venous thrombosis) (HCC) 07/2007   RLE, Tx Arixtra   Dysrhythmia    v fib and a fib has icd   Hemorrhoids    hx of   Hyperlipidemia    Hypertension    Neuropathy    Oxaliplatin  from cancer treatment   Obesity    Personal history of colon cancer, stage III 01/2007   Dr. Verdis Frederickson, D. Newman   Sigmoid colon ulcer 5 yrs ago   Sigmoid; DEzzard Standing, MD (colon cancer)   Sleep apnea    uses cpap setting of 11.2   Umbilical hernia     Patient Active Problem List   Diagnosis Date Noted   Polycythemia 03/25/2021   Thrombocytopenia (HCC) 03/11/2018   Advance directive discussed with patient 03/11/2018   Morbid obesity (HCC) 04/30/2017   Sleep apnea    Chronic diastolic heart failure (HCC) 02/03/2014   Diabetic nephropathy associated with type 2 diabetes mellitus (HCC) 11/11/2013   Stage 3a chronic kidney disease (HCC) 11/11/2013   History of colonic polyps 01/13/2013   Routine general medical  examination at a health care facility 09/13/2010   Type 2 diabetes, controlled, with neuropathy (HCC) 03/16/2010   OSTEOARTHRITIS, KNEES, BILATERAL 03/16/2010   Automatic implantable cardioverter-defibrillator in situ 07/16/2009   History of malignant neoplasm of large intestine 05/31/2009   Personal history of colon cancer, stage III 01/17/2007   Hyperlipidemia 06/19/2006   Essential hypertension 06/19/2006   Hypersomnia with sleep apnea 06/19/2006   Atrial fibrillation (HCC) 05/23/2006   Ventricular tachycardia (HCC) 12/17/2002    Past Surgical History:  Procedure Laterality Date   CARDIAC DEFIBRILLATOR PLACEMENT     2004   CHOLECYSTECTOMY     COLECTOMY  1/09   Sigmoid; Raelyn Mora, MD (colon cancer)   COLONOSCOPY     COLONOSCOPY N/A 02/25/2013   Procedure: COLONOSCOPY;  Surgeon: Iva Boop, MD;  Location: [facility];  Service: Endoscopy;  Laterality: N/A;   HERNIA REPAIR  yrs ago   umb hernia   ICD GENERATOR CHANGEOUT N/A 04/12/2017   Procedure: ICD GENERATOR CHANGEOUT;  Surgeon: Marinus Maw, MD;  Location: [facility] INVASIVE CV LAB;  Service: Cardiovascular;  Laterality: N/A;   INCISIONAL HERNIA REPAIR N/A 08/12/2020   Procedure: HERNIA REPAIR INCISIONAL with mesh;  Surgeon: Abigail Miyamoto, MD;  Location[facility] OR;  Service: General;  Laterality: N/A;  LMA  LAPAROSCOPIC GASTRIC SLEEVE RESECTION     Dr. Ezzard Standing 04-30-17   LAPAROSCOPIC GASTRIC SLEEVE RESECTION N/A 04/30/2017   Procedure: LAPAROSCOPIC GASTRIC SLEEVE RESECTION WITH UPPER ENDO;  Surgeon: Ovidio Kin, MD;  Location: [facility] ORS;  Service: General;  Laterality: N/A;       Home Medications    Prior to Admission medications   Medication Sig Start Date End Date Taking? Authorizing Provider  azithromycin (ZITHROMAX) 250 MG tablet Take 1 tablet (250 mg total) by mouth daily. Take first 2 tablets together, then 1 every day until finished. 02/07/23  Yes Ambar Raphael R, NP  benzonatate (TESSALON) 100 MG capsule Take 1  capsule (100 mg total) by mouth every 8 (eight) hours. 02/07/23  Yes Daimien Patmon R, NP  predniSONE (STERAPRED UNI-PAK 21 TAB) 10 MG (21) TBPK tablet Take by mouth daily. Take 6 tabs by mouth daily  for 1 days, then 5 tabs for 1 days, then 4 tabs for 1 days, then 3 tabs for 1 days, 2 tabs for 1 days, then 1 tab by mouth daily for 1 days 02/07/23  Yes Carleah Yablonski R, NP  promethazine-dextromethorphan (PROMETHAZINE-DM) 6.25-15 MG/5ML syrup Take 5 mLs by mouth at bedtime as needed. 02/07/23  Yes Taliana Mersereau, Elita Boone, NP  ACCU-CHEK FASTCLIX LANCETS MISC Use to check blood sugar three times a day.  Dx: E11.40 02/23/16   Karie Schwalbe, MD  ACCU-CHEK SMARTVIEW test strip USE AS INSTRUCTED TO TEST BLOOD SUGAR THREE TIMES DAILY 05/11/17   Karie Schwalbe, MD  acetaminophen (TYLENOL) 500 MG tablet Take 1,000 mg by mouth every 6 (six) hours as needed (pain).    [provider]  amiodarone (PACERONE) 200 MG tablet TAKE 1 TABLET EVERY DAY 12/18/22   Tillman Abide I, MD  atorvastatin (LIPITOR) 10 MG tablet TAKE 1 TABLET DAILY AT 6 PM. 12/18/22   Karie Schwalbe, MD  Blood Pressure Monitor DEVI  05/03/17   [provider]  diltiazem (DILACOR XR) 240 MG 24 hr capsule TAKE 1 CAPSULE EVERY MORNING 01/05/22   Tillman Abide I, MD  furosemide (LASIX) 40 MG tablet TAKE 1 TABLET EVERY DAY 10/04/22   Tillman Abide I, MD  lisinopril (ZESTRIL) 40 MG tablet TAKE 1 TABLET EVERY DAY 12/18/22   Tillman Abide I, MD  metoprolol succinate (TOPROL-XL) 100 MG 24 hr tablet TAKE 2 TABLETS EVERY DAY 03/13/22   Karie Schwalbe, MD  Multiple Vitamins-Minerals (BARIATRIC MULTIVITAMINS/IRON PO) Take 1 tablet by mouth in the morning.    [provider]  rivaroxaban (XARELTO) 20 MG TABS tablet TAKE 1 TABLET EVERY DAY WITH SUPPER 11/17/21   Karie Schwalbe, MD  Semaglutide, 2 MG/DOSE, (OZEMPIC, 2 MG/DOSE,) 8 MG/3ML SOPN INJECT 2MG  UNDER THE SKIN ONE TIME WEEKLY 01/31/23   Karie Schwalbe, MD    Family  History Family History  Problem Relation Age of Onset   Diabetes Father    Cancer Father        myelofibrosis   Colon cancer Other    Diabetes Maternal Grandmother    Cancer Maternal Aunt        colon   Hypertension Neg Hx    Coronary artery disease Neg Hx    Prostate cancer Neg Hx    Esophageal cancer Neg Hx    Rectal cancer Neg Hx    Stomach cancer Neg Hx     Social History Social History   Tobacco Use   Smoking status: Never    Passive exposure: Past  Smokeless tobacco: Never  Vaping Use   Vaping status: Never Used  Substance Use Topics   Alcohol use: No    Alcohol/week: 0.0 standard drinks of alcohol   Drug use: No     Allergies   Patient has no known allergies.   Review of Systems Review of Systems   Physical Exam Triage Vital Signs ED Triage Vitals  Encounter Vitals Group     BP 02/07/23 1111 125/85     Systolic BP Percentile --      Diastolic BP Percentile --      Pulse Rate 02/07/23 1111 (!) 107     Resp 02/07/23 1111 20     Temp 02/07/23 1111 99 F (37.2 C)     Temp Source 02/07/23 1111 Oral     SpO2 02/07/23 1111 94 %     Weight --      Height --      Head Circumference --      Peak Flow --      Pain Score 02/07/23 1108 0     Pain Loc --      Pain Education --      Exclude from Growth Chart --    No data found.  Updated Vital Signs BP 125/85 (BP Location: Left Arm)   Pulse (!) 107   Temp 99 F (37.2 C) (Oral)   Resp 20   SpO2 94%   Visual Acuity Right Eye Distance:   Left Eye Distance:   Bilateral Distance:    Right Eye Near:   Left Eye Near:    Bilateral Near:     Physical Exam Constitutional:      Appearance: Normal appearance.  HENT:     Head: Normocephalic.     Right Ear: Tympanic membrane, ear canal and external ear normal.     Left Ear: Tympanic membrane, ear canal and external ear normal.     Nose: Congestion present. No rhinorrhea.     Mouth/Throat:     Mouth: Mucous membranes are moist.     Pharynx:  Oropharynx is clear.  Eyes:     Extraocular Movements: Extraocular movements intact.  Cardiovascular:     Rate and Rhythm: Normal rate and regular rhythm.     Pulses: Normal pulses.     Heart sounds: Normal heart sounds.  Pulmonary:     Effort: Pulmonary effort is normal.     Breath sounds: Wheezing present.  Musculoskeletal:     Cervical back: Normal range of motion and neck supple.  Neurological:     Mental Status: He is alert and oriented to person, place, and time.      UC Treatments / Results  Labs (all labs ordered are listed, but only abnormal results are displayed) Labs Reviewed - No data to display  EKG   Radiology No results found.  Procedures Procedures (including critical care time)  Medications Ordered in UC Medications - No data to display  Initial Impression / Assessment and Plan / UC Course  I have reviewed the triage vital signs and the nursing notes.  Pertinent labs & imaging results that were available during my care of the patient were reviewed by me and considered in my medical decision making (see chart for details). Acute URI, wheezing  Patient is in no signs of distress nor toxic appearing.  Vital signs are stable.  Low suspicion for pneumonia, pneumothorax or bronchitis and therefore will defer imaging.  Etiology most likely viral, azithromycin prescribed to prevent worsening  symptoms.  Prescribed prednisone, Tessalon and Promethazine DM for management of cough, shortness of breath and wheezing.May use additional over-the-counter medications as needed for supportive care.  May follow-up with urgent care as needed if symptoms persist or worsen.    Final Clinical Impressions(s) / UC Diagnoses   Final diagnoses:  Acute URI  Wheezing     Discharge Instructions      Abdomen most likely caused by the RSV virus, you have been placed on antibiotic to keep symptoms from progressing to more serious respiratory condition such as pneumonia  Take  azithromycin as directed  Begin prednisone every morning with food as directed to open and relax the airway, should settle shortness of breath and wheezing heard on exam  may use Tessalon pill every 8 hours as needed for cough, may use cough syrup at bedtime for comfort You can take Tylenol and/or Ibuprofen as needed for fever reduction and pain relief.   For cough: honey 1/2 to 1 teaspoon (you can dilute the honey in water or another fluid).  You can also use guaifenesin and dextromethorphan for cough. You can use a humidifier for chest congestion and cough.  If you don't have a humidifier, you can sit in the bathroom with the hot shower running.      For sore throat: try warm salt water gargles, cepacol lozenges, throat spray, warm tea or water with lemon/honey, popsicles or ice, or OTC cold relief medicine for throat discomfort.   For congestion: take a daily anti-histamine like Zyrtec, Claritin, and a oral decongestant, such as pseudoephedrine.  You can also use Flonase 1-2 sprays in each nostril daily.   It is important to stay hydrated: drink plenty of fluids (water, gatorade/powerade/pedialyte, juices, or teas) to keep your throat moisturized and help further relieve irritation/discomfort.    ED Prescriptions     Medication Sig Dispense Auth. Provider   predniSONE (STERAPRED UNI-PAK 21 TAB) 10 MG (21) TBPK tablet Take by mouth daily. Take 6 tabs by mouth daily  for 1 days, then 5 tabs for 1 days, then 4 tabs for 1 days, then 3 tabs for 1 days, 2 tabs for 1 days, then 1 tab by mouth daily for 1 days 21 tablet Dinero Chavira R, NP   azithromycin (ZITHROMAX) 250 MG tablet Take 1 tablet (250 mg total) by mouth daily. Take first 2 tablets together, then 1 every day until finished. 6 tablet Nyelli Samara R, NP   benzonatate (TESSALON) 100 MG capsule Take 1 capsule (100 mg total) by mouth every 8 (eight) hours. 21 capsule Litsy Epting R, NP   promethazine-dextromethorphan  (PROMETHAZINE-DM) 6.25-15 MG/5ML syrup Take 5 mLs by mouth at bedtime as needed. 118 mL Aissata Wilmore, Elita Boone, NP      PDMP not reviewed this encounter.   Valinda Hoar, NP 02/07/23 1142

## 2023-02-07 NOTE — ED Triage Notes (Signed)
Pt presents to [facility] for c/o cough, runny nose x1 week. Grandson has RSV Taking mucinex DM

## 2023-02-07 NOTE — Discharge Instructions (Signed)
Abdomen most likely caused by the RSV virus, you have been placed on antibiotic to keep symptoms from progressing to more serious respiratory condition such as pneumonia  Take azithromycin as directed  Begin prednisone every morning with food as directed to open and relax the airway, should settle shortness of breath and wheezing heard on exam  may use Tessalon pill every 8 hours as needed for cough, may use cough syrup at bedtime for comfort You can take Tylenol and/or Ibuprofen as needed for fever reduction and pain relief.   For cough: honey 1/2 to 1 teaspoon (you can dilute the honey in water or another fluid).  You can also use guaifenesin and dextromethorphan for cough. You can use a humidifier for chest congestion and cough.  If you don't have a humidifier, you can sit in the bathroom with the hot shower running.      For sore throat: try warm salt water gargles, cepacol lozenges, throat spray, warm tea or water with lemon/honey, popsicles or ice, or OTC cold relief medicine for throat discomfort.   For congestion: take a daily anti-histamine like Zyrtec, Claritin, and a oral decongestant, such as pseudoephedrine.  You can also use Flonase 1-2 sprays in each nostril daily.   It is important to stay hydrated: drink plenty of fluids (water, gatorade/powerade/pedialyte, juices, or teas) to keep your throat moisturized and help further relieve irritation/discomfort.

## 2023-02-14 ENCOUNTER — Ambulatory Visit (INDEPENDENT_AMBULATORY_CARE_PROVIDER_SITE_OTHER): Payer: Medicare HMO | Admitting: Internal Medicine

## 2023-02-14 ENCOUNTER — Encounter: Payer: Self-pay | Admitting: Internal Medicine

## 2023-02-14 VITALS — BP 124/72 | HR 80 | Temp 98.4°F | Ht 73.0 in | Wt 348.0 lb

## 2023-02-14 DIAGNOSIS — Z7985 Long-term (current) use of injectable non-insulin antidiabetic drugs: Secondary | ICD-10-CM

## 2023-02-14 DIAGNOSIS — E1140 Type 2 diabetes mellitus with diabetic neuropathy, unspecified: Secondary | ICD-10-CM

## 2023-02-14 DIAGNOSIS — J22 Unspecified acute lower respiratory infection: Secondary | ICD-10-CM | POA: Diagnosis not present

## 2023-02-14 DIAGNOSIS — E119 Type 2 diabetes mellitus without complications: Secondary | ICD-10-CM | POA: Diagnosis not present

## 2023-02-14 DIAGNOSIS — I4891 Unspecified atrial fibrillation: Secondary | ICD-10-CM | POA: Diagnosis not present

## 2023-02-14 LAB — POCT GLYCOSYLATED HEMOGLOBIN (HGB A1C): Hemoglobin A1C: 7.5 % — AB (ref 4.0–5.6)

## 2023-02-14 MED ORDER — RIVAROXABAN 20 MG PO TABS: ORAL_TABLET | ORAL | 3 refills | Status: DC

## 2023-02-14 NOTE — Assessment & Plan Note (Signed)
Occ palpitations but paced now (so unclear if a fib) Is on xarelto/diltiazem 240 daily, metoprolol 200mg  daily

## 2023-02-14 NOTE — Assessment & Plan Note (Signed)
Weight back down a little Continues on the semaglutide

## 2023-02-14 NOTE — Progress Notes (Signed)
Subjective:    Patient ID: Craig Lindsey, male    DOB: [birth year], 63 y.o.   MRN: [id number]  HPI Here for follow up of diabetes--and recent respiratory infection  Recent respiratory infection Was exposed to grandson with RSV Seen at walk in a week ago---sore throat, nasal drainage Then cough with brown phlegm Given prednisone and z-pak Still coughing but not as bad---worst when lies down Breathing has been okay  Sugar up today--150 Hasn't checked in past month--but had been 120's Hasn't been able to exercise much with recent illness Same numbness in feet--no pain  No chest pain Occasional palpitations--brief  (racing) No SOB No dizziness or syncope Keeps up with cardiologist---and does remote check every 3 months for defibrillator  Current Outpatient Medications on File Prior to Visit  Medication Sig Dispense Refill   ACCU-CHEK FASTCLIX LANCETS MISC Use to check blood sugar three times a day.  Dx: E11.40 306 each 3   ACCU-CHEK SMARTVIEW test strip USE AS INSTRUCTED TO TEST BLOOD SUGAR THREE TIMES DAILY 300 each 12   acetaminophen (TYLENOL) 500 MG tablet Take 1,000 mg by mouth every 6 (six) hours as needed (pain).     amiodarone (PACERONE) 200 MG tablet TAKE 1 TABLET EVERY DAY 90 tablet 3   atorvastatin (LIPITOR) 10 MG tablet TAKE 1 TABLET DAILY AT 6 PM. 90 tablet 3   benzonatate (TESSALON) 100 MG capsule Take 1 capsule (100 mg total) by mouth every 8 (eight) hours. 21 capsule 0   Blood Pressure Monitor DEVI      diltiazem (DILACOR XR) 240 MG 24 hr capsule TAKE 1 CAPSULE EVERY MORNING 90 capsule 3   furosemide (LASIX) 40 MG tablet TAKE 1 TABLET EVERY DAY 90 tablet 3   lisinopril (ZESTRIL) 40 MG tablet TAKE 1 TABLET EVERY DAY 90 tablet 3   metoprolol succinate (TOPROL-XL) 100 MG 24 hr tablet TAKE 2 TABLETS EVERY DAY 180 tablet 3   Multiple Vitamins-Minerals (BARIATRIC MULTIVITAMINS/IRON PO) Take 1 tablet by mouth in the morning.     promethazine-dextromethorphan  (PROMETHAZINE-DM) 6.25-15 MG/5ML syrup Take 5 mLs by mouth at bedtime as needed. 118 mL 0   rivaroxaban (XARELTO) 20 MG TABS tablet TAKE 1 TABLET EVERY DAY WITH SUPPER 90 tablet 3   Semaglutide, 2 MG/DOSE, (OZEMPIC, 2 MG/DOSE,) 8 MG/3ML SOPN INJECT 2MG  UNDER THE SKIN ONE TIME WEEKLY 9 mL 3   No current facility-administered medications on file prior to visit.    No Known Allergies  Past Medical History:  Diagnosis Date   AICD (automatic cardioverter/defibrillator) present    Arrhythmia 12/2002   1. Ventricular tachycardia; Dr. Ladona Ridgel  2. Atrial fibrillation   Arthritis    knees   Cancer (HCC)    Chronic kidney disease    Complex L renal cyst, no tx for    Diabetes mellitus    Type II   DVT (deep venous thrombosis) (HCC) 07/2007   RLE, Tx Arixtra   Dysrhythmia    v fib and a fib has icd   Hemorrhoids    hx of   Hyperlipidemia    Hypertension    Neuropathy    Oxaliplatin  from cancer treatment   Obesity    Personal history of colon cancer, stage III 01/2007   Dr. Verdis Frederickson, D. Newman   Sigmoid colon ulcer 5 yrs ago   Sigmoid; DEzzard Standing, MD (colon cancer)   Sleep apnea    uses cpap setting of 11.2   Umbilical hernia  Past Surgical History:  Procedure Laterality Date   CARDIAC DEFIBRILLATOR PLACEMENT     2004   CHOLECYSTECTOMY     COLECTOMY  1/09   Sigmoid; Raelyn Mora, MD (colon cancer)   COLONOSCOPY     COLONOSCOPY N/A 02/25/2013   Procedure: COLONOSCOPY;  Surgeon: Iva Boop, MD;  Location: [facility];  Service: Endoscopy;  Laterality: N/A;   HERNIA REPAIR  yrs ago   umb hernia   ICD GENERATOR CHANGEOUT N/A 04/12/2017   Procedure: ICD GENERATOR CHANGEOUT;  Surgeon: Marinus Maw, MD;  Location: [facility] INVASIVE CV LAB;  Service: Cardiovascular;  Laterality: N/A;   INCISIONAL HERNIA REPAIR N/A 08/12/2020   Procedure: HERNIA REPAIR INCISIONAL with mesh;  Surgeon: Abigail Miyamoto, MD;  Location: [facility] OR;  Service: General;  Laterality: N/A;  LMA    LAPAROSCOPIC GASTRIC SLEEVE RESECTION     Dr. Ezzard Standing 04-30-17   LAPAROSCOPIC GASTRIC SLEEVE RESECTION N/A 04/30/2017   Procedure: LAPAROSCOPIC GASTRIC SLEEVE RESECTION WITH UPPER ENDO;  Surgeon: Ovidio Kin, MD;  Location: [facility]S;  Service: General;  Laterality: N/A;    Family History  Problem Relation Age of Onset   Diabetes Father    Cancer Father        myelofibrosis   Colon cancer Other    Diabetes Maternal Grandmother    Cancer Maternal Aunt        colon   Hypertension Neg Hx    Coronary artery disease Neg Hx    Prostate cancer Neg Hx    Esophageal cancer Neg Hx    Rectal cancer Neg Hx    Stomach cancer Neg Hx     Social History   Socioeconomic History   Marital status: Married    Spouse name: Not on file   Number of children: 2   Years of education: Not on file   Highest education level: Associate degree: occupational, Scientist, product/process development, or vocational program  Occupational History   Occupation: Runner, broadcasting/film/video and block sales    Comment: Disabled  Tobacco Use   Smoking status: Never    Passive exposure: Past   Smokeless tobacco: Never  Vaping Use   Vaping status: Never Used  Substance and Sexual Activity   Alcohol use: No    Alcohol/week: 0.0 standard drinks of alcohol   Drug use: No   Sexual activity: Yes  Other Topics Concern   Not on file  Social History Narrative   2 step children from wife's 2nd marriage.      Has living will   Wife is health care POA---alternate would be sister, Fredderick Phenix   Would accept resuscitation attempts   Not sure about tube feedings--but not excited about it    Social Drivers of Health   Financial Resource Strain: Low Risk  (02/12/2023)   Overall Financial Resource Strain (CARDIA)    Difficulty of Paying Living Expenses: Not very hard  Food Insecurity: No Food Insecurity (02/12/2023)   Hunger Vital Sign    Worried About Running Out of Food in the Last Year: Never true    Ran Out of Food in the Last Year: Never true  Transportation  Needs: No Transportation Needs (02/12/2023)   PRAPARE - Administrator, Civil Service (Medical): No    Lack of Transportation (Non-Medical): No  Physical Activity: Insufficiently Active (02/12/2023)   Exercise Vital Sign    Days of Exercise per Week: 4 days    Minutes of Exercise per Session: 10 min  Stress: No Stress Concern Present (  02/12/2023)   [employer] of Occupational Health - Occupational Stress Questionnaire    Feeling of Stress : Not at all  Social Connections: Socially Integrated (02/12/2023)   Social Connection and Isolation Panel [NHANES]    Frequency of Communication with Friends and Family: More than three times a week    Frequency of Social Gatherings with Friends and Family: Once a week    Attends Religious Services: More than 4 times per year    Active Member of [employer] or Organizations: Yes    Attends Engineer, structural: More than 4 times per year    Marital Status: Married  Catering manager Violence: Not on file   Review of Systems Appetite is suppressed some--has lost a few pounds again Sleeps okay in general    Objective:   Physical Exam Constitutional:      Appearance: Normal appearance.  Cardiovascular:     Rate and Rhythm: Normal rate and regular rhythm.     Heart sounds:     No gallop.  Pulmonary:     Breath sounds: No wheezing or rales.     Comments: Not tight and normal exp phase--but some scant expiratory rhonchi Chest:     Chest wall: No tenderness.  Musculoskeletal:     Cervical back: Neck supple.     Right lower leg: No edema.     Left lower leg: No edema.  Lymphadenopathy:     Cervical: No cervical adenopathy.  Skin:    Comments: No foot lesions  Neurological:     Mental Status: He is alert.            Assessment & Plan:

## 2023-02-14 NOTE — Assessment & Plan Note (Signed)
Lab Results  Component Value Date   HGBA1C 7.5 (A) 02/14/2023   Worse now--some of it may be from recent prednisone Asked him to monitor more closely Ozempic 2mg  weekly Consider farxiga/jardiance if not better next time

## 2023-02-14 NOTE — Assessment & Plan Note (Signed)
Course seems consistent with RSV Discussed gradual resolution No action for now

## 2023-02-15 NOTE — Telephone Encounter (Signed)
Patient picked up form

## 2023-02-22 ENCOUNTER — Encounter: Payer: Self-pay | Admitting: Internal Medicine

## 2023-02-22 MED ORDER — GLUCOSE BLOOD VI STRP: ORAL_STRIP | 12 refills | Status: DC

## 2023-02-22 MED ORDER — ONETOUCH DELICA PLUS LANCET33G MISC: 1.0000 | Freq: Every day | 3 refills | Status: AC

## 2023-02-22 MED ORDER — ONETOUCH DELICA LANCING DEV MISC: 1.0000 | Freq: Once | 0 refills | Status: AC

## 2023-03-01 ENCOUNTER — Ambulatory Visit: Payer: Medicare HMO | Admitting: Internal Medicine

## 2023-03-01 ENCOUNTER — Encounter: Payer: Self-pay | Admitting: Internal Medicine

## 2023-03-01 VITALS — BP 116/70 | HR 75 | Temp 98.3°F | Resp 96 | Ht 73.0 in | Wt 350.0 lb

## 2023-03-01 DIAGNOSIS — L03115 Cellulitis of right lower limb: Secondary | ICD-10-CM

## 2023-03-01 DIAGNOSIS — L03116 Cellulitis of left lower limb: Secondary | ICD-10-CM | POA: Insufficient documentation

## 2023-03-01 MED ORDER — CEPHALEXIN 500 MG PO CAPS: 500.0000 mg | ORAL_CAPSULE | Freq: Three times a day (TID) | ORAL | 1 refills | Status: DC

## 2023-03-01 NOTE — Assessment & Plan Note (Signed)
Somewhat puzzling as left is worse than right--but right has some open areas, and not left Discussed that compression from shoes could be an issue---but he hasn't changed them and no new tasks, standing, etc Will try cephalexin 500 tid x 5 days --with refill

## 2023-03-01 NOTE — Progress Notes (Signed)
Subjective:    Patient ID: Craig Lindsey, male    DOB: [birth year], 63 y.o.   MRN: [id number]  HPI Here due to ankle swelling  Started with pain in left > right foot 2 days ago Swelling and red tinge and pain also No fever  Weight is up a few pounds---and more than a year ago  Sugars okay--- 135 -140 fasting  No recent travel or prolonged standing  Current Outpatient Medications on File Prior to Visit  Medication Sig Dispense Refill   ACCU-CHEK FASTCLIX LANCETS MISC Use to check blood sugar three times a day.  Dx: E11.40 306 each 3   ACCU-CHEK SMARTVIEW test strip USE AS INSTRUCTED TO TEST BLOOD SUGAR THREE TIMES DAILY 300 each 12   acetaminophen (TYLENOL) 500 MG tablet Take 1,000 mg by mouth every 6 (six) hours as needed (pain).     amiodarone (PACERONE) 200 MG tablet TAKE 1 TABLET EVERY DAY 90 tablet 3   atorvastatin (LIPITOR) 10 MG tablet TAKE 1 TABLET DAILY AT 6 PM. 90 tablet 3   benzonatate (TESSALON) 100 MG capsule Take 1 capsule (100 mg total) by mouth every 8 (eight) hours. 21 capsule 0   Blood Pressure Monitor DEVI      diltiazem (DILACOR XR) 240 MG 24 hr capsule TAKE 1 CAPSULE EVERY MORNING 90 capsule 3   furosemide (LASIX) 40 MG tablet TAKE 1 TABLET EVERY DAY 90 tablet 3   glucose blood test strip Use to check blood sugar once daily 100 each 12   Lancets (ONETOUCH DELICA PLUS LANCET33G) MISC 1 each by Does not apply route daily. 100 each 3   lisinopril (ZESTRIL) 40 MG tablet TAKE 1 TABLET EVERY DAY 90 tablet 3   metoprolol succinate (TOPROL-XL) 100 MG 24 hr tablet TAKE 2 TABLETS EVERY DAY 180 tablet 3   Multiple Vitamins-Minerals (BARIATRIC MULTIVITAMINS/IRON PO) Take 1 tablet by mouth in the morning.     promethazine-dextromethorphan (PROMETHAZINE-DM) 6.25-15 MG/5ML syrup Take 5 mLs by mouth at bedtime as needed. 118 mL 0   rivaroxaban (XARELTO) 20 MG TABS tablet TAKE 1 TABLET EVERY DAY WITH SUPPER 90 tablet 3   Semaglutide, 2 MG/DOSE, (OZEMPIC, 2 MG/DOSE,) 8 MG/3ML  SOPN INJECT 2MG  UNDER THE SKIN ONE TIME WEEKLY 9 mL 3   No current facility-administered medications on file prior to visit.    No Known Allergies  Past Medical History:  Diagnosis Date   AICD (automatic cardioverter/defibrillator) present    Arrhythmia 12/2002   1. Ventricular tachycardia; Dr. Ladona Ridgel  2. Atrial fibrillation   Arthritis    knees   Cancer (HCC)    Chronic kidney disease    Complex L renal cyst, no tx for    Diabetes mellitus    Type II   DVT (deep venous thrombosis) (HCC) 07/2007   RLE, Tx Arixtra   Dysrhythmia    v fib and a fib has icd   Hemorrhoids    hx of   Hyperlipidemia    Hypertension    Neuropathy    Oxaliplatin  from cancer treatment   Obesity    Personal history of colon cancer, stage III 01/2007   Dr. Verdis Frederickson, D. [place]   Sigmoid colon ulcer 5 yrs ago   Sigmoid; DEzzard Standing, MD (colon cancer)   Sleep apnea    uses cpap setting of 11.2   Umbilical hernia     Past Surgical History:  Procedure Laterality Date   CARDIAC DEFIBRILLATOR PLACEMENT  2004   CHOLECYSTECTOMY     COLECTOMY  1/09   Sigmoid; Raelyn Mora, MD (colon cancer)   COLONOSCOPY     COLONOSCOPY N/A 02/25/2013   Procedure: COLONOSCOPY;  Surgeon: Iva Boop, MD;  Location: [facility];  Service: Endoscopy;  Laterality: N/A;   HERNIA REPAIR  yrs ago   umb hernia   ICD GENERATOR CHANGEOUT N/A 04/12/2017   Procedure: ICD GENERATOR CHANGEOUT;  Surgeon: Marinus Maw, MD;  [facility] INVASIVE CV LAB;  Service: Cardiovascular;  Laterality: N/A;   INCISIONAL HERNIA REPAIR N/A 08/12/2020   Procedure: HERNIA REPAIR INCISIONAL with mesh;  Surgeon: Abigail Miyamoto, MD;  Location: [facility] OR;  Service: General;  Laterality: N/A;  LMA   LAPAROSCOPIC GASTRIC SLEEVE RESECTION     Dr. Ezzard Standing 04-30-17   LAPAROSCOPIC GASTRIC SLEEVE RESECTION N/A 04/30/2017   Procedure: LAPAROSCOPIC GASTRIC SLEEVE RESECTION WITH UPPER ENDO;  Surgeon: Ovidio Kin, MD;  Location: [facility] ORS;  Service:  General;  Laterality: N/A;    Family History  Problem Relation Age of Onset   Diabetes Father    Cancer Father        myelofibrosis   Colon cancer Other    Diabetes Maternal Grandmother    Cancer Maternal Aunt        colon   Hypertension Neg Hx    Coronary artery disease Neg Hx    Prostate cancer Neg Hx    Esophageal cancer Neg Hx    Rectal cancer Neg Hx    Stomach cancer Neg Hx     Social History   Socioeconomic History   Marital status: Married    Spouse name: Not on file   Number of children: 2   Years of education: Not on file   Highest education level: Associate degree: occupational, Scientist, product/process development, or vocational program  Occupational History   Occupation: Runner, broadcasting/film/video and block sales    Comment: Disabled  Tobacco Use   Smoking status: Never    Passive exposure: Past   Smokeless tobacco: Never  Vaping Use   Vaping status: Never Used  Substance and Sexual Activity   Alcohol use: No    Alcohol/week: 0.0 standard drinks of alcohol   Drug use: No   Sexual activity: Yes  Other Topics Concern   Not on file  Social History Narrative   2 step children from wife's 2nd marriage.      Has living will   Wife is health care POA---alternate would be sister, Fredderick Phenix   Would accept resuscitation attempts   Not sure about tube feedings--but not excited about it    Social Drivers of Health   Financial Resource Strain: Low Risk  (02/12/2023)   Overall Financial Resource Strain (CARDIA)    Difficulty of Paying Living Expenses: Not very hard  Food Insecurity: No Food Insecurity (02/12/2023)   Hunger Vital Sign    Worried About Running Out of Food in the Last Year: Never true    Ran Out of Food in the Last Year: Never true  Transportation Needs: No Transportation Needs (02/12/2023)   PRAPARE - Administrator, Civil Service (Medical): No    Lack of Transportation (Non-Medical): No  Physical Activity: Insufficiently Active (02/12/2023)   Exercise Vital Sign    Days  of Exercise per Week: 4 days    Minutes of Exercise per Session: 10 min  Stress: No Stress Concern Present (02/12/2023)   [employer] of Occupational Health - Occupational Stress Questionnaire    Feeling  of Stress : Not at all  Social Connections: Socially Integrated (02/12/2023)   Social Connection and Isolation Panel [NHANES]    Frequency of Communication with Friends and Family: More than three times a week    Frequency of Social Gatherings with Friends and Family: Once a week    Attends Religious Services: More than 4 times per year    Active Member of [employer] or Organizations: Yes    Attends Engineer, structural: More than 4 times per year    Marital Status: Married  Catering manager Violence: Not on file   Review of Systems No chest pain No SOB Sleeps in bed with head and feet up some (adjustable bed). No PND. Uses CPAP    Objective:   Physical Exam Constitutional:      Appearance: Normal appearance.  Cardiovascular:     Rate and Rhythm: Normal rate and regular rhythm.     Heart sounds: No murmur heard.    No gallop.  Pulmonary:     Effort: Pulmonary effort is normal.     Breath sounds: Normal breath sounds. No wheezing or rales.  Musculoskeletal:     Cervical back: Neck supple.     Comments: No true ankle edema  Lymphadenopathy:     Cervical: No cervical adenopathy.  Skin:    Comments: Chronic stasis changes in both lower calves--some skin breaks on right but not left Both feet have puffiness, warmth and redness on dorsum of foot just distal to ankles--left is worse  Neurological:     Mental Status: He is alert.            Assessment & Plan:

## 2023-03-02 ENCOUNTER — Encounter: Payer: Self-pay | Admitting: Internal Medicine

## 2023-03-03 DIAGNOSIS — E119 Type 2 diabetes mellitus without complications: Secondary | ICD-10-CM | POA: Diagnosis not present

## 2023-03-03 DIAGNOSIS — M79672 Pain in left foot: Secondary | ICD-10-CM | POA: Diagnosis not present

## 2023-03-03 DIAGNOSIS — N189 Chronic kidney disease, unspecified: Secondary | ICD-10-CM | POA: Diagnosis not present

## 2023-03-03 DIAGNOSIS — M7989 Other specified soft tissue disorders: Secondary | ICD-10-CM | POA: Diagnosis not present

## 2023-03-03 DIAGNOSIS — D68318 Other hemorrhagic disorder due to intrinsic circulating anticoagulants, antibodies, or inhibitors: Secondary | ICD-10-CM | POA: Diagnosis not present

## 2023-03-07 NOTE — Progress Notes (Signed)
 Remote ICD transmission.

## 2023-03-08 ENCOUNTER — Ambulatory Visit: Payer: Medicare HMO | Admitting: Podiatry

## 2023-03-13 ENCOUNTER — Ambulatory Visit: Payer: Medicare HMO | Admitting: Podiatry

## 2023-03-13 DIAGNOSIS — M722 Plantar fascial fibromatosis: Secondary | ICD-10-CM

## 2023-03-13 NOTE — Progress Notes (Signed)
 Subjective:  Patient ID: Craig Lindsey, male    DOB: [birth year],  MRN: [id number]  Chief Complaint  Patient presents with   Foot Pain    Left foot pain  Pt brought a copy of his xrays from [facility]. He stated that they told him he had arthritis     63 y.o. male presents with the above complaint.  Patient presents with bilateral plantar fasciitis pain painful to touch.  He states the right testicular ache in the left side is 100% resolved again.  He went to [facility] who had x-rays done and he is here for follow-up treatment.  Denies any other acute complaints he has never had an injection in the past   Review of Systems: Negative except as noted in the HPI. Denies N/V/F/Ch.  Past Medical History:  Diagnosis Date   AICD (automatic cardioverter/defibrillator) present    Arrhythmia 12/2002   1. Ventricular tachycardia; Dr. Ladona Ridgel  2. Atrial fibrillation   Arthritis    knees   Cancer (HCC)    Chronic kidney disease    Complex L renal cyst, no tx for    Diabetes mellitus    Type II   DVT (deep venous thrombosis) (HCC) 07/2007   RLE, Tx Arixtra   Dysrhythmia    v fib and a fib has icd   Hemorrhoids    hx of   Hyperlipidemia    Hypertension    Neuropathy    Oxaliplatin  from cancer treatment   Obesity    Personal history of colon cancer, stage III 01/2007   Dr. Verdis Frederickson, D. [place]   Sigmoid colon ulcer 5 yrs ago   Sigmoid; DEzzard Standing, MD (colon cancer)   Sleep apnea    uses cpap setting of 11.2   Umbilical hernia     Current Outpatient Medications:    ACCU-CHEK FASTCLIX LANCETS MISC, Use to check blood sugar three times a day.  Dx: E11.40, Disp: 306 each, Rfl: 3   ACCU-CHEK SMARTVIEW test strip, USE AS INSTRUCTED TO TEST BLOOD SUGAR THREE TIMES DAILY, Disp: 300 each, Rfl: 12   acetaminophen (TYLENOL) 500 MG tablet, Take 1,000 mg by mouth every 6 (six) hours as needed (pain)., Disp: , Rfl:    amiodarone (PACERONE) 200 MG tablet, TAKE 1 TABLET EVERY DAY,  Disp: 90 tablet, Rfl: 3   atorvastatin (LIPITOR) 10 MG tablet, TAKE 1 TABLET DAILY AT 6 PM., Disp: 90 tablet, Rfl: 3   benzonatate (TESSALON) 100 MG capsule, Take 1 capsule (100 mg total) by mouth every 8 (eight) hours., Disp: 21 capsule, Rfl: 0   Blood Pressure Monitor DEVI, , Disp: , Rfl:    cephALEXin (KEFLEX) 500 MG capsule, Take 1 capsule (500 mg total) by mouth 3 (three) times daily., Disp: 15 capsule, Rfl: 1   diltiazem (DILACOR XR) 240 MG 24 hr capsule, TAKE 1 CAPSULE EVERY MORNING, Disp: 90 capsule, Rfl: 3   furosemide (LASIX) 40 MG tablet, TAKE 1 TABLET EVERY DAY, Disp: 90 tablet, Rfl: 3   glucose blood test strip, Use to check blood sugar once daily, Disp: 100 each, Rfl: 12   Lancets (ONETOUCH DELICA PLUS LANCET33G) MISC, 1 each by Does not apply route daily., Disp: 100 each, Rfl: 3   lisinopril (ZESTRIL) 40 MG tablet, TAKE 1 TABLET EVERY DAY, Disp: 90 tablet, Rfl: 3   metoprolol succinate (TOPROL-XL) 100 MG 24 hr tablet, TAKE 2 TABLETS EVERY DAY, Disp: 180 tablet, Rfl: 3   Multiple Vitamins-Minerals (BARIATRIC MULTIVITAMINS/IRON PO), Take  1 tablet by mouth in the morning., Disp: , Rfl:    promethazine-dextromethorphan (PROMETHAZINE-DM) 6.25-15 MG/5ML syrup, Take 5 mLs by mouth at bedtime as needed., Disp: 118 mL, Rfl: 0   rivaroxaban (XARELTO) 20 MG TABS tablet, TAKE 1 TABLET EVERY DAY WITH SUPPER, Disp: 90 tablet, Rfl: 3   Semaglutide, 2 MG/DOSE, (OZEMPIC, 2 MG/DOSE,) 8 MG/3ML SOPN, INJECT 2MG  UNDER THE SKIN ONE TIME WEEKLY, Disp: 9 mL, Rfl: 3  Social History   Tobacco Use  Smoking Status Never   Passive exposure: Past  Smokeless Tobacco Never    No Known Allergies Objective:  There were no vitals filed for this visit. There is no height or weight on file to calculate BMI. Constitutional Well developed. Well nourished.  Vascular Dorsalis pedis pulses palpable bilaterally. Posterior tibial pulses palpable bilaterally. Capillary refill normal to all digits.  No cyanosis or  clubbing noted. Pedal hair growth normal.  Neurologic Normal speech. Oriented to person, place, and time. Epicritic sensation to light touch grossly present bilaterally.  Dermatologic Nails well groomed and normal in appearance. No open wounds. No skin lesions.  Orthopedic: Normal joint ROM without pain or crepitus bilaterally. No visible deformities. Tender to palpation at the calcaneal tuber bilaterally. No pain with calcaneal squeeze bilaterally. Ankle ROM diminished range of motion bilaterally. Silfverskiold Test: positive bilaterally.   Radiographs: Taken and reviewed. No acute fractures or dislocations. No evidence of stress fracture.  Plantar heel spur present. Posterior heel spur present.  Pes cavus foot structure.  Radiographs were reviewed with the CD from [facility]  Assessment:  No diagnosis found. Plan:  Patient was evaluated and treated and all questions answered.  Plantar Fasciitis, bilaterally - XR reviewed as above.  - Educated on icing and stretching. Instructions given.  - Injection delivered to the plantar fascia as below. - DME: Plantar fascial brace dispensed to support the medial longitudinal arch of the foot and offload pressure from the heel and prevent arch collapse during weightbearing - Pharmacologic management: None  Procedure: Injection Tendon/Ligament Location: Bilateral plantar fascia at the glabrous junction; medial approach. Skin Prep: alcohol Injectate: 0.5 cc 0.5% marcaine plain, 0.5 cc of 1% Lidocaine, 0.5 cc kenalog 10. Disposition: Patient tolerated procedure well. Injection site dressed with a band-aid.  No follow-ups on file.

## 2023-03-18 ENCOUNTER — Ambulatory Visit

## 2023-03-20 ENCOUNTER — Other Ambulatory Visit: Payer: Self-pay | Admitting: Internal Medicine

## 2023-04-05 ENCOUNTER — Telehealth: Payer: Self-pay | Admitting: Internal Medicine

## 2023-04-05 NOTE — Telephone Encounter (Signed)
 Phone call about the urine microal lab error His corrected ratio is 40--which is mildly elevated. Known mild CKD already He is on lisinopril Last A1c 6.0%  Discussed that I would not recommend SGLT-2 inhibitor at this point unless protein in urine increased or diabetes control worsened. Will recheck this at his July visit

## 2023-04-17 ENCOUNTER — Encounter: Admitting: Urology

## 2023-04-26 ENCOUNTER — Ambulatory Visit (INDEPENDENT_AMBULATORY_CARE_PROVIDER_SITE_OTHER): Payer: Medicare HMO

## 2023-04-26 DIAGNOSIS — I472 Ventricular tachycardia, unspecified: Secondary | ICD-10-CM

## 2023-04-27 LAB — CUP PACEART REMOTE DEVICE CHECK
Battery Remaining Longevity: 52 mo
Battery Voltage: 2.98 V
Brady Statistic RV Percent Paced: 0.75 %
Date Time Interrogation Session: 20250410012505
HighPow Impedance: 58 Ohm
HighPow Impedance: 72 Ohm
Implantable Lead Connection Status: 753985
Implantable Lead Implant Date: 20050113
Implantable Lead Location: 753860
Implantable Lead Model: 6949
Implantable Pulse Generator Implant Date: 20190328
Lead Channel Impedance Value: 361 Ohm
Lead Channel Impedance Value: 532 Ohm
Lead Channel Pacing Threshold Amplitude: 1.25 V
Lead Channel Pacing Threshold Pulse Width: 0.4 ms
Lead Channel Sensing Intrinsic Amplitude: 8.625 mV
Lead Channel Sensing Intrinsic Amplitude: 8.625 mV
Lead Channel Setting Pacing Amplitude: 2.5 V
Lead Channel Setting Pacing Pulse Width: 0.4 ms
Lead Channel Setting Sensing Sensitivity: 0.3 mV
Zone Setting Status: 755011
Zone Setting Status: 755011

## 2023-05-01 ENCOUNTER — Encounter: Payer: Self-pay | Admitting: Internal Medicine

## 2023-06-06 DIAGNOSIS — M25421 Effusion, right elbow: Secondary | ICD-10-CM | POA: Diagnosis not present

## 2023-06-06 DIAGNOSIS — L03113 Cellulitis of right upper limb: Secondary | ICD-10-CM | POA: Diagnosis not present

## 2023-06-08 DIAGNOSIS — M25421 Effusion, right elbow: Secondary | ICD-10-CM | POA: Diagnosis not present

## 2023-06-08 DIAGNOSIS — M10321 Gout due to renal impairment, right elbow: Secondary | ICD-10-CM | POA: Diagnosis not present

## 2023-06-08 DIAGNOSIS — L03113 Cellulitis of right upper limb: Secondary | ICD-10-CM | POA: Diagnosis not present

## 2023-06-08 NOTE — Addendum Note (Signed)
 Addended by: Lott Rouleau A on: 06/08/2023 09:47 AM   Modules accepted: Orders

## 2023-06-08 NOTE — Progress Notes (Signed)
 Remote ICD transmission.

## 2023-06-15 ENCOUNTER — Ambulatory Visit

## 2023-07-04 ENCOUNTER — Other Ambulatory Visit: Payer: Self-pay | Admitting: Internal Medicine

## 2023-07-04 DIAGNOSIS — I5032 Chronic diastolic (congestive) heart failure: Secondary | ICD-10-CM

## 2023-07-16 ENCOUNTER — Other Ambulatory Visit: Payer: Self-pay | Admitting: Internal Medicine

## 2023-07-19 ENCOUNTER — Ambulatory Visit

## 2023-07-26 ENCOUNTER — Ambulatory Visit: Payer: Medicare HMO

## 2023-07-26 DIAGNOSIS — I472 Ventricular tachycardia, unspecified: Secondary | ICD-10-CM

## 2023-07-27 LAB — CUP PACEART REMOTE DEVICE CHECK
Battery Remaining Longevity: 48 mo
Battery Voltage: 2.98 V
Brady Statistic RV Percent Paced: 1.61 %
Date Time Interrogation Session: 20250710002205
HighPow Impedance: 57 Ohm
HighPow Impedance: 76 Ohm
Implantable Lead Connection Status: 753985
Implantable Lead Implant Date: 20050113
Implantable Lead Location: 753860
Implantable Lead Model: 6949
Implantable Pulse Generator Implant Date: 20190328
Lead Channel Impedance Value: 399 Ohm
Lead Channel Impedance Value: 532 Ohm
Lead Channel Pacing Threshold Amplitude: 1.25 V
Lead Channel Pacing Threshold Pulse Width: 0.4 ms
Lead Channel Sensing Intrinsic Amplitude: 8.5 mV
Lead Channel Sensing Intrinsic Amplitude: 8.5 mV
Lead Channel Setting Pacing Amplitude: 2.5 V
Lead Channel Setting Pacing Pulse Width: 0.4 ms
Lead Channel Setting Sensing Sensitivity: 0.3 mV
Zone Setting Status: 755011
Zone Setting Status: 755011

## 2023-07-30 ENCOUNTER — Ambulatory Visit: Payer: Self-pay | Admitting: Internal Medicine

## 2023-08-14 ENCOUNTER — Encounter: Payer: Medicare HMO | Admitting: Internal Medicine

## 2023-08-18 ENCOUNTER — Encounter: Payer: Self-pay | Admitting: Internal Medicine

## 2023-09-09 ENCOUNTER — Telehealth: Payer: Self-pay | Admitting: Physician Assistant

## 2023-09-09 NOTE — Telephone Encounter (Signed)
 Pt w hx of VF s/p ICD. He heard 2 consecutive alarms from his device 45 minutes apart. He has a single chamber device and has a low % of pacing. His battery life in July was 4 years. I reviewed with Dr. Kennyth, on call EP.   PLAN: Will have device clinic interrogate his device tomorrow.  Glendia Ferrier, PA-C    09/09/2023 4:44 PM

## 2023-09-10 ENCOUNTER — Ambulatory Visit: Attending: Internal Medicine | Admitting: Internal Medicine

## 2023-09-10 ENCOUNTER — Telehealth: Payer: Self-pay

## 2023-09-10 ENCOUNTER — Encounter: Payer: Self-pay | Admitting: Internal Medicine

## 2023-09-10 VITALS — BP 155/78 | HR 74 | Ht 74.5 in | Wt 360.0 lb

## 2023-09-10 DIAGNOSIS — T82198A Other mechanical complication of other cardiac electronic device, initial encounter: Secondary | ICD-10-CM

## 2023-09-10 NOTE — Telephone Encounter (Signed)
 Per Dr. Waddell, patient is fine to add onto schedule today in [facility] office.   Patient called made aware of apt today 09/10/23 @ 3:45 pm. Patient aware of late, location and time.

## 2023-09-10 NOTE — Telephone Encounter (Signed)
 Open in Error.

## 2023-09-10 NOTE — Telephone Encounter (Signed)
 Alert received from [employer] for RVtip to RVcoil lead impedance >3000 ohms. *RV Defib lead impedance >200 ohms. *SVC Defib lead impedance >200 ohms. Noisy signal on presenting- route to triage.  Consulted with [employer] tech services, states RV coil fx and recommends replacement of RV lead.  Noise is noted on presenting, however device is programmed bipolar sensing RVtip-/RVring and no noise noted on EGM. [employer] tech services states unable to guarantee no risk for inappropriate shock. Last OV in office >3 year. Message sent to RN with Dr. Waddell in clinic today to see if OV available in [place] office today.    Waiting response to put plan into place.

## 2023-09-10 NOTE — Patient Instructions (Addendum)
 Medication Instructions:  Your physician recommends that you continue on your current medications as directed. Please refer to the Current Medication list given to you today.  *If you need a refill on your cardiac medications before your next appointment, please call your pharmacy*  Lab Work: NONE   If you have labs (blood work) drawn today and your tests are completely normal, you will receive your results only by: MyChart Message (if you have MyChart) OR A paper copy in the mail If you have any lab test that is abnormal or we need to change your treatment, we will call you to review the results.  Testing/Procedures: NONE   Follow-Up: At [facility], you and your health needs are our priority.  As part of our continuing mission to provide you with exceptional heart care, our providers are all part of one team.  This team includes your primary Cardiologist (physician) and Advanced Practice Providers or APPs (Physician Assistants and Nurse Practitioners) who all work together to provide you with the care you need, when you need it.  Your next appointment:   1 year   Provider:   Dr. Kennyth     We recommend signing up for the patient portal called MyChart.  Sign up information is provided on this After Visit Summary.  MyChart is used to connect with patients for Virtual Visits (Telemedicine).  Patients are able to view lab/test results, encounter notes, upcoming appointments, etc.  Non-urgent messages can be sent to your provider as well.   To learn more about what you can do with MyChart, go to [email or web address].   Other Instructions Thank you for choosing [facility]!

## 2023-09-10 NOTE — Progress Notes (Signed)
 HPI Craig Lindsey returns today for followup. He is a pleasant 63 yo morbidly obese man, s/p remote VF arrest I 2001 who has never had therapies since, PAF on amio who is s/p ICD insertion. He has undergone bariatric surgery and has lost some weight.  He would like to lose more weight. We were notified today that he has developed an elevated shock impedence on his distal ICD coil. He presents to discuss treatment options.  No Known Allergies   Current Outpatient Medications  Medication Sig Dispense Refill   ACCU-CHEK FASTCLIX LANCETS MISC Use to check blood sugar three times a day.  Dx: E11.40 306 each 3   ACCU-CHEK SMARTVIEW test strip USE AS INSTRUCTED TO TEST BLOOD SUGAR THREE TIMES DAILY 300 each 12   acetaminophen  (TYLENOL ) 500 MG tablet Take 1,000 mg by mouth every 6 (six) hours as needed (pain).     amiodarone  (PACERONE ) 200 MG tablet TAKE 1 TABLET EVERY DAY 90 tablet 3   atorvastatin  (LIPITOR) 10 MG tablet TAKE 1 TABLET DAILY AT 6 PM. 90 tablet 3   benzonatate  (TESSALON ) 100 MG capsule Take 1 capsule (100 mg total) by mouth every 8 (eight) hours. 21 capsule 0   Blood Pressure Monitor DEVI      cephALEXin  (KEFLEX ) 500 MG capsule Take 1 capsule (500 mg total) by mouth 3 (three) times daily. 15 capsule 1   DILT-XR 240 MG 24 hr capsule TAKE 1 CAPSULE EVERY MORNING 90 capsule 3   furosemide  (LASIX ) 40 MG tablet TAKE 1 TABLET EVERY DAY 90 tablet 3   glucose blood test strip Use to check blood sugar once daily 100 each 12   Lancets (ONETOUCH DELICA PLUS LANCET33G) MISC 1 each by Does not apply route daily. 100 each 3   lisinopril  (ZESTRIL ) 40 MG tablet TAKE 1 TABLET EVERY DAY 90 tablet 3   metoprolol  succinate (TOPROL -XL) 100 MG 24 hr tablet TAKE 2 TABLETS EVERY DAY 180 tablet 3   Multiple Vitamins-Minerals (BARIATRIC MULTIVITAMINS/IRON PO) Take 1 tablet by mouth in the morning.     promethazine -dextromethorphan (PROMETHAZINE -DM) 6.25-15 MG/5ML syrup Take 5 mLs by mouth at bedtime as  needed. 118 mL 0   rivaroxaban  (XARELTO ) 20 MG TABS tablet TAKE 1 TABLET EVERY DAY WITH SUPPER 90 tablet 3   Semaglutide , 2 MG/DOSE, (OZEMPIC , 2 MG/DOSE,) 8 MG/3ML SOPN INJECT 2MG  UNDER THE SKIN ONE TIME WEEKLY 9 mL 3   No current facility-administered medications for this visit.     Past Medical History:  Diagnosis Date   AICD (automatic cardioverter/defibrillator) present    Arrhythmia 12/2002   1. Ventricular tachycardia; Dr. Waddell  2. Atrial fibrillation   Arthritis    knees   Cancer (HCC)    Chronic kidney disease    Complex L renal cyst, no tx for    Diabetes mellitus    Type II   DVT (deep venous thrombosis) (HCC) 07/2007   RLE, Tx Arixtra   Dysrhythmia    v fib and a fib has icd   Hemorrhoids    hx of   Hyperlipidemia    Hypertension    Neuropathy    Oxaliplatin  from cancer treatment   Obesity    Personal history of colon cancer, stage III 01/2007   Dr. Avram Hof, D. Newman   Sigmoid colon ulcer 5 yrs ago   Sigmoid; DSABRA Angle, MD (colon cancer)   Sleep apnea    uses cpap setting of 11.2   Umbilical  hernia     ROS:   All systems reviewed and negative except as noted in the HPI.   Past Surgical History:  Procedure Laterality Date   CARDIAC DEFIBRILLATOR PLACEMENT     2004   CHOLECYSTECTOMY     COLECTOMY  1/09   Sigmoid; CHARM Angle, MD (colon cancer)   COLONOSCOPY     COLONOSCOPY N/A 02/25/2013   Procedure: COLONOSCOPY;  Surgeon: Lupita FORBES Commander, MD;  Location: [facility];  Service: Endoscopy;  Laterality: N/A;   HERNIA REPAIR  yrs ago   umb hernia   ICD GENERATOR CHANGEOUT N/A 04/12/2017   Procedure: ICD GENERATOR CHANGEOUT;  Surgeon: Waddell Danelle ORN, MD;  Location: [facility];  Service: Cardiovascular;  Laterality: N/A;   INCISIONAL HERNIA REPAIR N/A 08/12/2020   Procedure: HERNIA REPAIR INCISIONAL with mesh;  Surgeon: Vernetta Berg, MD;  Location: [facility] OR;  Service: General;  Laterality: N/A;  LMA   LAPAROSCOPIC GASTRIC SLEEVE  RESECTION     Dr. Angle 04-30-17   LAPAROSCOPIC GASTRIC SLEEVE RESECTION N/A 04/30/2017   Procedure: LAPAROSCOPIC GASTRIC SLEEVE RESECTION WITH UPPER ENDO;  Surgeon: Angle Lenis, MD;  Location: [facility] ORS;  Service: General;  Laterality: N/A;     Family History  Problem Relation Age of Onset   Diabetes Father    Cancer Father        myelofibrosis   Colon cancer Other    Diabetes Maternal Grandmother    Cancer Maternal Aunt        colon   Hypertension Neg Hx    Coronary artery disease Neg Hx    Prostate cancer Neg Hx    Esophageal cancer Neg Hx    Rectal cancer Neg Hx    Stomach cancer Neg Hx      Social History   Socioeconomic History   Marital status: Married    Spouse name: Not on file   Number of children: 2   Years of education: Not on file   Highest education level: Associate degree: occupational, Scientist, product/process development, or vocational program  Occupational History   Occupation: Runner, broadcasting/film/video and block sales    Comment: Disabled  Tobacco Use   Smoking status: Never    Passive exposure: Past   Smokeless tobacco: Never  Vaping Use   Vaping status: Never Used  Substance and Sexual Activity   Alcohol use: No    Alcohol/week: 0.0 standard drinks of alcohol   Drug use: No   Sexual activity: Yes  Other Topics Concern   Not on file  Social History Narrative   2 step children from wife's 2nd marriage.      Has living will   Wife is health care POA---alternate would be sister, Delon Hamburg   Would accept resuscitation attempts   Not sure about tube feedings--but not excited about it    Social Drivers of Health   Financial Resource Strain: Low Risk  (02/12/2023)   Overall Financial Resource Strain (CARDIA)    Difficulty of Paying Living Expenses: Not very hard  Food Insecurity: No Food Insecurity (02/12/2023)   Hunger Vital Sign    Worried About Running Out of Food in the Last Year: Never true    Ran Out of Food in the Last Year: Never true  Transportation Needs: No Transportation  Needs (02/12/2023)   PRAPARE - Administrator, Civil Service (Medical): No    Lack of Transportation (Non-Medical): No  Physical Activity: Insufficiently Active (02/12/2023)   Exercise Vital Sign  Days of Exercise per Week: 4 days    Minutes of Exercise per Session: 10 min  Stress: No Stress Concern Present (02/12/2023)   [employer] of Occupational Health - Occupational Stress Questionnaire    Feeling of Stress : Not at all  Social Connections: Socially Integrated (02/12/2023)   Social Connection and Isolation Panel    Frequency of Communication with Friends and Family: More than three times a week    Frequency of Social Gatherings with Friends and Family: Once a week    Attends Religious Services: More than 4 times per year    Active Member of [employer] or Organizations: Yes    Attends Engineer, structural: More than 4 times per year    Marital Status: Married  Catering manager Violence: Not on file     Ht 6' 2.5 (1.892 m)   Wt (!) 360 lb (163.3 kg)   SpO2 98%   BMI 45.60 kg/m   Physical Exam:  Well appearing NAD HEENT: Unremarkable Neck:  No JVD, no thyromegally Lymphatics:  No adenopathy Back:  No CVA tenderness Lungs:  Clear with no wheezes HEART:  Regular rate rhythm, no murmurs, no rubs, no clicks Abd:  soft, positive bowel sounds, morbidly obese Ext:  2 plus pulses, no edema, no cyanosis, no clubbing Skin:  No rashes no nodules Neuro:  CN II through XII intact, motor grossly intact  DEVICE  Normal device function.  See PaceArt for details.   Assess/Plan:  Obesity - we discussed the importance of persistence and dietary restriction.  2. VF - he has had no recurrent episodes. 3. PAF - he is maintaining NSR. No change in his meds. 4. Diastolic heart failure - his symptoms are class 2. Weight loss, a low sodium diet,k and continued medical therapy have been discussed in detail. 5. Broken ICD lead - I discussed the treatment options. Lead  extraction vs inserting a new lead vs turning his device off were all reviewed and the risks/benefits/ of all options discussed in detail. He prefers to turn off the device. He will call us  if he changes his mind. We do not know if his vein is occluded.    Danelle Waddell HERO.D.

## 2023-09-11 LAB — CUP PACEART INCLINIC DEVICE CHECK
Battery Remaining Longevity: 55 mo
Battery Voltage: 2.92 V
Brady Statistic RV Percent Paced: 6.49 %
Date Time Interrogation Session: 20250825165400
HighPow Impedance: 254 Ohm
HighPow Impedance: 254 Ohm
Implantable Lead Connection Status: 753985
Implantable Lead Implant Date: 20050113
Implantable Lead Location: 753860
Implantable Lead Model: 6949
Implantable Pulse Generator Implant Date: 20190328
Lead Channel Impedance Value: 4047 Ohm
Lead Channel Impedance Value: 551 Ohm
Lead Channel Pacing Threshold Amplitude: 1.5 V
Lead Channel Pacing Threshold Pulse Width: 0.4 ms
Lead Channel Sensing Intrinsic Amplitude: 10 mV
Lead Channel Setting Sensing Sensitivity: 0.3 mV
Zone Setting Status: 755011

## 2023-09-18 ENCOUNTER — Ambulatory Visit: Admitting: Family Medicine

## 2023-10-08 ENCOUNTER — Encounter: Payer: Self-pay | Admitting: Family Medicine

## 2023-10-08 ENCOUNTER — Ambulatory Visit (INDEPENDENT_AMBULATORY_CARE_PROVIDER_SITE_OTHER): Admitting: Family Medicine

## 2023-10-08 VITALS — BP 118/74 | HR 89 | Temp 98.5°F | Ht 74.5 in | Wt 363.0 lb

## 2023-10-08 DIAGNOSIS — Z95811 Presence of heart assist device: Secondary | ICD-10-CM | POA: Insufficient documentation

## 2023-10-08 DIAGNOSIS — E1140 Type 2 diabetes mellitus with diabetic neuropathy, unspecified: Secondary | ICD-10-CM

## 2023-10-08 DIAGNOSIS — E785 Hyperlipidemia, unspecified: Secondary | ICD-10-CM

## 2023-10-08 DIAGNOSIS — N1832 Chronic kidney disease, stage 3b: Secondary | ICD-10-CM | POA: Insufficient documentation

## 2023-10-08 DIAGNOSIS — Z23 Encounter for immunization: Secondary | ICD-10-CM

## 2023-10-08 DIAGNOSIS — D693 Immune thrombocytopenic purpura: Secondary | ICD-10-CM | POA: Diagnosis not present

## 2023-10-08 DIAGNOSIS — I10 Essential (primary) hypertension: Secondary | ICD-10-CM

## 2023-10-08 DIAGNOSIS — Z7985 Long-term (current) use of injectable non-insulin antidiabetic drugs: Secondary | ICD-10-CM

## 2023-10-08 DIAGNOSIS — E119 Type 2 diabetes mellitus without complications: Secondary | ICD-10-CM

## 2023-10-08 DIAGNOSIS — Z13220 Encounter for screening for lipoid disorders: Secondary | ICD-10-CM | POA: Diagnosis not present

## 2023-10-08 DIAGNOSIS — I5032 Chronic diastolic (congestive) heart failure: Secondary | ICD-10-CM

## 2023-10-08 DIAGNOSIS — N1831 Chronic kidney disease, stage 3a: Secondary | ICD-10-CM

## 2023-10-08 MED ORDER — METOPROLOL SUCCINATE ER 100 MG PO TB24: 200.0000 mg | ORAL_TABLET | Freq: Every day | ORAL | 3 refills | Status: AC

## 2023-10-08 MED ORDER — LISINOPRIL 40 MG PO TABS: 40.0000 mg | ORAL_TABLET | Freq: Every day | ORAL | 3 refills | Status: AC

## 2023-10-08 MED ORDER — ACCU-CHEK FASTCLIX LANCETS MISC: 3 refills | Status: DC

## 2023-10-08 MED ORDER — OZEMPIC (2 MG/DOSE) 8 MG/3ML ~~LOC~~ SOPN: 2.0000 mg | PEN_INJECTOR | SUBCUTANEOUS | 3 refills | Status: DC

## 2023-10-08 MED ORDER — FUROSEMIDE 40 MG PO TABS: 40.0000 mg | ORAL_TABLET | Freq: Every day | ORAL | 3 refills | Status: AC

## 2023-10-08 MED ORDER — ATORVASTATIN CALCIUM 10 MG PO TABS: 10.0000 mg | ORAL_TABLET | Freq: Every day | ORAL | 3 refills | Status: AC

## 2023-10-08 MED ORDER — GLUCOSE BLOOD VI STRP: ORAL_STRIP | 12 refills | Status: DC

## 2023-10-08 MED ORDER — RIVAROXABAN 20 MG PO TABS: ORAL_TABLET | ORAL | 3 refills | Status: AC

## 2023-10-08 MED ORDER — AMIODARONE HCL 200 MG PO TABS: 200.0000 mg | ORAL_TABLET | Freq: Every day | ORAL | 3 refills | Status: AC

## 2023-10-08 MED ORDER — DILTIAZEM HCL ER 240 MG PO CP24: 240.0000 mg | ORAL_CAPSULE | Freq: Every morning | ORAL | 3 refills | Status: AC

## 2023-10-08 NOTE — Progress Notes (Signed)
 New Patient Office Visit  Subjective    Patient ID: BRIER REID, male    DOB: January 11, 1961  Age: 63 y.o. MRN: [id number]  CC:  Chief Complaint  Patient presents with   Establish Care    Patient is here to establish care     Assessment & Plan:   Type 2 diabetes, controlled, with neuropathy (HCC) -     Hemoglobin A1c -     Microalbumin / creatinine urine ratio  Chronic idiopathic thrombocytopenia (HCC) -     CBC with Differential/Platelet  Encounter for lipid screening for cardiovascular disease -     Lipid panel  Stage 3a chronic kidney disease (HCC) -     Comprehensive metabolic panel with GFR  Essential hypertension  Presence of heart assist device (HCC)  Stage 3b chronic kidney disease (HCC)  Chronic diastolic heart failure (HCC)  Encounter for immunization -     Flu vaccine trivalent PF, 6mos and older(Flulaval,Afluria,Fluarix,Fluzone)  Diabetes mellitus treated with injections of non-insulin  medication (HCC)  Hyperlipidemia, unspecified hyperlipidemia type  Other orders -     Ozempic  (2 MG/DOSE); Inject 2 mg into the skin once a week.  Dispense: 9 mL; Refill: 3 -     Rivaroxaban ; TAKE 1 TABLET EVERY DAY WITH SUPPER  Dispense: 90 tablet; Refill: 3 -     Metoprolol  Succinate ER; Take 2 tablets (200 mg total) by mouth daily.  Dispense: 180 tablet; Refill: 3 -     Lisinopril ; Take 1 tablet (40 mg total) by mouth daily.  Dispense: 90 tablet; Refill: 3 -     Furosemide ; Take 1 tablet (40 mg total) by mouth daily.  Dispense: 90 tablet; Refill: 3 -     dilTIAZem  HCl ER; Take 1 capsule (240 mg total) by mouth every morning.  Dispense: 90 capsule; Refill: 3 -     Atorvastatin  Calcium ; Take 1 tablet (10 mg total) by mouth daily.  Dispense: 90 tablet; Refill: 3 -     Amiodarone  HCl; Take 1 tablet (200 mg total) by mouth daily.  Dispense: 90 tablet; Refill: 3 -     Accu-Chek FastClix Lancets; Use to check blood sugar three times a day.  Dx: E11.40  Dispense: 306 each;  Refill: 3 -     Glucose Blood; Use to check blood sugar once daily  Dispense: 100 each; Refill: 12   Assessment and Plan Assessment & Plan Type 2 diabetes mellitus with diabetic neuropathy Lab Results  Component Value Date   HGBA1C 7.5 (A) 02/14/2023    Long-standing diabetes, fairly controlled glucose levels with semaglutide . Significant weight loss achieved, some regained. - Continue semaglutide  (Ozempic ). - Encourage regular blood glucose monitoring.  Heart failure with defibrillator in situ (lead turned off) Defibrillator implanted since 2004, lead broken, device off. No recent cardiac events.  - Coordinate medication refills with cardiology. - Follow up with cardiology for heart failure and defibrillator management.  Venous insufficiency with leg swelling and stasis changes Chronic venous insufficiency with leg swelling and stasis changes. Compression stockings not worn today. - Encourage regular use of compression stockings.  Obstructive sleep apnea with CPAP therapy Managed with CPAP, no recent titration. Long-standing condition possibly affecting cardiac health. - Continue CPAP therapy.  Right knee pain, chronic Chronic knee pain, likely weight-related. History of injury 30 years ago. - Encourage weight loss.    Return in about 2 months (around 12/08/2023) for Annual.   Ladoris MARLA Ny, MD   HPI  Discussed the use of AI  scribe software for clinical note transcription with the patient, who gave verbal consent to proceed.  History of Present Illness Chasten Blaze is a 63 year old male with a history of PAF, ICD, hypertension, hyperlipidemia, diabetes mellitus type 2,OSA, CKD, chronic venous stasis who presents for establish care and medication management.  He has a history of heart issues, including a defibrillator implanted in 2004 after a cardiac event where he coded and was resuscitated. About two weeks ago, an alarm on the defibrillator went off due to  a broken lead, and it was subsequently turned off. He is on blood thinners, currently Xarelto , and other heart medications including pacerone , diltiazem , and Lasix . He uses a CPAP machine for sleep apnea.  He has been managing diabetes for 15-20 years and is currently on semaglutide  (Ozempic ). He has experienced significant weight fluctuations, previously weighing over 400 pounds, losing down to 300 pounds, and currently weighing around 360 pounds. He checks his blood sugar weekly, with fasting levels around 120 mg/dL.  He has a history of right knee pain from an injury 30 years ago, which was never treated. He also experiences leg swelling and uses compression stockings.  He has a history of plantar fasciitis and has seen a foot doctor for this condition.  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 He lives with his wife and has two children, one of whom recently returned from [place].   RAHMON HEIGL presents to establish care   Outpatient Encounter Medications as of 10/08/2023  Medication Sig   acetaminophen  (TYLENOL ) 500 MG tablet Take 1,000 mg by mouth every 6 (six) hours as needed (pain).   Blood Pressure Monitor DEVI    Lancets (ONETOUCH DELICA PLUS LANCET33G) MISC 1 each by Does not apply route daily.   Multiple Vitamins-Minerals (BARIATRIC MULTIVITAMINS/IRON PO) Take 1 tablet by mouth in the morning.   [DISCONTINUED] ACCU-CHEK FASTCLIX LANCETS MISC Use to check blood sugar three times a day.  Dx: E11.40   [DISCONTINUED] amiodarone  (PACERONE ) 200 MG tablet TAKE 1 TABLET EVERY DAY   [DISCONTINUED] atorvastatin  (LIPITOR) 10 MG tablet TAKE 1 TABLET DAILY AT 6 PM.   [DISCONTINUED] DILT-XR 240 MG 24 hr capsule TAKE 1 CAPSULE EVERY MORNING   [DISCONTINUED] furosemide  (LASIX ) 40 MG tablet TAKE 1 TABLET EVERY DAY   [DISCONTINUED] glucose blood test strip Use to check blood sugar once daily   [DISCONTINUED] lisinopril   (ZESTRIL ) 40 MG tablet TAKE 1 TABLET EVERY DAY   [DISCONTINUED] metoprolol  succinate (TOPROL -XL) 100 MG 24 hr tablet TAKE 2 TABLETS EVERY DAY   [DISCONTINUED] rivaroxaban  (XARELTO ) 20 MG TABS tablet TAKE 1 TABLET EVERY DAY WITH SUPPER   [DISCONTINUED] Semaglutide , 2 MG/DOSE, (OZEMPIC , 2 MG/DOSE,) 8 MG/3ML SOPN INJECT 2MG  UNDER THE SKIN ONE TIME WEEKLY   Accu-Chek FastClix Lancets MISC Use to check blood sugar three times a day.  Dx: E11.40   amiodarone  (PACERONE ) 200 MG tablet Take 1 tablet (200 mg total) by mouth daily.   atorvastatin  (LIPITOR) 10 MG tablet Take 1 tablet (10 mg total) by mouth daily.   diltiazem  (DILT-XR) 240 MG 24 hr capsule Take 1 capsule (240 mg total) by mouth every morning.   furosemide  (LASIX ) 40 MG tablet Take 1 tablet (40 mg total) by mouth daily.   glucose blood test strip Use to check blood sugar once daily   lisinopril  (ZESTRIL ) 40 MG tablet Take 1 tablet (40 mg total) by mouth  daily.   metoprolol  succinate (TOPROL -XL) 100 MG 24 hr tablet Take 2 tablets (200 mg total) by mouth daily.   rivaroxaban  (XARELTO ) 20 MG TABS tablet TAKE 1 TABLET EVERY DAY WITH SUPPER   Semaglutide , 2 MG/DOSE, (OZEMPIC , 2 MG/DOSE,) 8 MG/3ML SOPN Inject 2 mg into the skin once a week.   [DISCONTINUED] ACCU-CHEK SMARTVIEW test strip USE AS INSTRUCTED TO TEST BLOOD SUGAR THREE TIMES DAILY   [DISCONTINUED] benzonatate  (TESSALON ) 100 MG capsule Take 1 capsule (100 mg total) by mouth every 8 (eight) hours.   [DISCONTINUED] cephALEXin  (KEFLEX ) 500 MG capsule Take 1 capsule (500 mg total) by mouth 3 (three) times daily.   [DISCONTINUED] promethazine -dextromethorphan (PROMETHAZINE -DM) 6.25-15 MG/5ML syrup Take 5 mLs by mouth at bedtime as needed.   No facility-administered encounter medications on file as of 10/08/2023.      Review of Systems  All other systems reviewed and are negative.       Objective    BP 118/74   Pulse 89   Temp 98.5 F (36.9 C) (Oral)   Ht 6' 2.5 (1.892 m)   Wt  (!) 363 lb (164.7 kg)   SpO2 97%   BMI 45.98 kg/m   Physical Exam Vitals and nursing note reviewed.  Constitutional:      Appearance: Normal appearance.  HENT:     Head: Normocephalic.     Right Ear: External ear normal.     Left Ear: External ear normal.  Eyes:     Conjunctiva/sclera: Conjunctivae normal.  Cardiovascular:     Rate and Rhythm: Normal rate.  Pulmonary:     Effort: Pulmonary effort is normal. No respiratory distress.  Abdominal:     Palpations: Abdomen is soft.  Musculoskeletal:        General: Normal range of motion.  Skin:    General: Skin is warm.  Neurological:     Mental Status: He is alert and oriented to person, place, and time.  Psychiatric:        Mood and Affect: Mood normal.

## 2023-10-09 LAB — COMPREHENSIVE METABOLIC PANEL WITH GFR
ALT: 35 IU/L (ref 0–44)
AST: 20 IU/L (ref 0–40)
Albumin: 4 g/dL (ref 3.9–4.9)
Alkaline Phosphatase: 80 IU/L (ref 47–123)
BUN/Creatinine Ratio: 17 (ref 10–24)
BUN: 32 mg/dL — ABNORMAL HIGH (ref 8–27)
Bilirubin Total: 0.6 mg/dL (ref 0.0–1.2)
CO2: 17 mmol/L — ABNORMAL LOW (ref 20–29)
Calcium: 9.3 mg/dL (ref 8.6–10.2)
Chloride: 108 mmol/L — ABNORMAL HIGH (ref 96–106)
Creatinine, Ser: 1.84 mg/dL — ABNORMAL HIGH (ref 0.76–1.27)
Globulin, Total: 2.9 g/dL (ref 1.5–4.5)
Glucose: 168 mg/dL — ABNORMAL HIGH (ref 70–99)
Potassium: 4.7 mmol/L (ref 3.5–5.2)
Sodium: 142 mmol/L (ref 134–144)
Total Protein: 6.9 g/dL (ref 6.0–8.5)
eGFR: 41 mL/min/1.73 — ABNORMAL LOW (ref >59)

## 2023-10-09 LAB — CBC WITH DIFFERENTIAL/PLATELET
Basophils Absolute: 0.1 x10E3/uL (ref 0.0–0.2)
Basos: 1 %
EOS (ABSOLUTE): 0.2 x10E3/uL (ref 0.0–0.4)
Eos: 3 %
Hematocrit: 51.6 % — ABNORMAL HIGH (ref 37.5–51.0)
Hemoglobin: 17.1 g/dL (ref 13.0–17.7)
Immature Grans (Abs): 0 x10E3/uL (ref 0.0–0.1)
Immature Granulocytes: 0 %
Lymphocytes Absolute: 2.6 x10E3/uL (ref 0.7–3.1)
Lymphs: 29 %
MCH: 31.3 pg (ref 26.6–33.0)
MCHC: 33.1 g/dL (ref 31.5–35.7)
MCV: 95 fL (ref 79–97)
Monocytes Absolute: 0.5 x10E3/uL (ref 0.1–0.9)
Monocytes: 6 %
Neutrophils Absolute: 5.4 x10E3/uL (ref 1.4–7.0)
Neutrophils: 60 %
Platelets: 165 x10E3/uL (ref 150–450)
RBC: 5.46 x10E6/uL (ref 4.14–5.80)
RDW: 13.1 % (ref 11.6–15.4)
WBC: 8.8 x10E3/uL (ref 3.4–10.8)

## 2023-10-09 LAB — LIPID PANEL
Chol/HDL Ratio: 3.5 ratio (ref 0.0–5.0)
Cholesterol, Total: 119 mg/dL (ref 100–199)
HDL: 34 mg/dL — ABNORMAL LOW (ref >39)
LDL Chol Calc (NIH): 56 mg/dL (ref 0–99)
Triglycerides: 175 mg/dL — ABNORMAL HIGH (ref 0–149)
VLDL Cholesterol Cal: 29 mg/dL (ref 5–40)

## 2023-10-09 LAB — HEMOGLOBIN A1C
Est. average glucose Bld gHb Est-mCnc: 160 mg/dL
Hgb A1c MFr Bld: 7.2 % — ABNORMAL HIGH (ref 4.8–5.6)

## 2023-10-10 ENCOUNTER — Ambulatory Visit: Payer: Self-pay | Admitting: Family Medicine

## 2023-10-10 LAB — OPHTHALMOLOGY REPORT-SCANNED

## 2023-10-16 ENCOUNTER — Other Ambulatory Visit (HOSPITAL_COMMUNITY): Payer: Self-pay

## 2023-10-16 ENCOUNTER — Telehealth: Payer: Self-pay | Admitting: Pharmacy Technician

## 2023-10-16 NOTE — Telephone Encounter (Signed)
 Pharmacy Patient Advocate Encounter   Received notification from CoverMyMeds that prior authorization for OneTouch Ultra Test strips is required/requested.   Insurance verification completed.   The patient is insured through [employer] .   Per test claim:  ACCU-CHEK or TRUE METRIX is preferred by the insurance.  If suggested medication is appropriate, Please send in a new RX and discontinue this one. If not, please advise as to why it's not appropriate so that we may request a Prior Authorization. Please note, some preferred medications may still require a PA.  If the suggested medications have not been trialed and there are no contraindications to their use, the PA will not be submitted, as it will not be approved.

## 2023-10-22 ENCOUNTER — Other Ambulatory Visit: Payer: Self-pay

## 2023-10-22 MED ORDER — GLUCOSE BLOOD VI STRP: ORAL_STRIP | 12 refills | Status: AC

## 2023-10-22 NOTE — Telephone Encounter (Signed)
 Sent in accu test strips

## 2023-10-26 NOTE — Progress Notes (Signed)
 Remote ICD Transmission

## 2023-12-17 ENCOUNTER — Encounter: Payer: Self-pay | Admitting: Family Medicine

## 2023-12-17 ENCOUNTER — Ambulatory Visit: Admitting: Family Medicine

## 2023-12-17 VITALS — BP 116/74 | HR 78 | Ht 74.5 in | Wt 361.0 lb

## 2023-12-17 DIAGNOSIS — Z0000 Encounter for general adult medical examination without abnormal findings: Secondary | ICD-10-CM | POA: Diagnosis not present

## 2023-12-17 DIAGNOSIS — M25561 Pain in right knee: Secondary | ICD-10-CM

## 2023-12-17 DIAGNOSIS — N1832 Chronic kidney disease, stage 3b: Secondary | ICD-10-CM

## 2023-12-17 DIAGNOSIS — Z23 Encounter for immunization: Secondary | ICD-10-CM

## 2023-12-17 DIAGNOSIS — K635 Polyp of colon: Secondary | ICD-10-CM | POA: Diagnosis not present

## 2023-12-17 DIAGNOSIS — Z7985 Long-term (current) use of injectable non-insulin antidiabetic drugs: Secondary | ICD-10-CM

## 2023-12-17 DIAGNOSIS — E1140 Type 2 diabetes mellitus with diabetic neuropathy, unspecified: Secondary | ICD-10-CM | POA: Diagnosis not present

## 2023-12-17 DIAGNOSIS — G8929 Other chronic pain: Secondary | ICD-10-CM

## 2023-12-17 MED ORDER — TIRZEPATIDE 2.5 MG/0.5ML ~~LOC~~ SOAJ: 2.5000 mg | SUBCUTANEOUS | 0 refills | Status: AC

## 2023-12-17 NOTE — Progress Notes (Signed)
 Complete physical exam  Patient: Craig Lindsey    DOB: 28-Aug-1960 63 y.o.   MRN: [id number]  Chief Complaint  Patient presents with   Annual Exam    Subjective:    Craig Lindsey is a 63 y.o. male who presents today for a complete physical exam. He reports consuming a general diet. The patient does not participate in regular exercise at present. He generally feels fairly well. He reports sleeping fairly well. He does not have additional problems to discuss today.   Discussed the use of AI scribe software for clinical note transcription with the patient, who gave verbal consent to proceed. Wt Readings from Last 3 Encounters:  12/17/23 (!) 361 lb (163.7 kg)  10/08/23 (!) 363 lb (164.7 kg)  09/10/23 (!) 360 lb (163.3 kg)    History of Present Illness Craig Lindsey is a 63 year old male who presents for an annual physical exam.  He is doing well with no specific questions or concerns. He is up to date with his medications and recently saw his cardiologist about a month and a half ago. During that visit, his defibrillator was turned off due to a broken lead, and it was decided to leave it in place. He is managing his heart condition with medications and reports that his heart has been doing okay.  He is due for a pneumonia vaccine and recalls receiving a flu shot previously. He is also due for a colonoscopy, having last completed one in 2020. He had polyps removed during his last colonoscopy five years ago. He has a history of colon cancer and had a medical hernia repaired a few years ago.  He has chronic kidney disease and a history of hypertension and diabetes. He is due for an A1c check next month. He experiences neuropathy in one foot but has no sores. He is currently on Ozempic  2 mg for diabetes management and reports a slight weight loss. He has previously undergone weight loss surgery and manages his weight with diet and exercise. He uses Tylenol  for pain management  and is on Xarelto . No history of pancreatitis or thyroid  cancer.  No chest pain, shortness of breath, or gastrointestinal issues. He has a long-standing issue with his right knee due to arthritis and uses arthritis cream for relief. He lives with his wife and he has recently decorated for Christmas.   Most recent fall risk assessment:    12/17/2023    9:26 AM  Fall Risk   Falls in the past year? 0  Number falls in past yr: 0  Injury with Fall? 0  Risk for fall due to : No Fall Risks  Follow up Falls evaluation completed     Most recent depression screenings:    12/17/2023    9:26 AM 10/08/2023    9:34 AM  PHQ 2/9 Scores  PHQ - 2 Score 0 0  PHQ- 9 Score  1      Data saved with a previous flowsheet row definition    Vision:this year no retinopathy per pt    Patient Care Team: Keyira Mondesir K, MD as PCP - General (Family Medicine) Waddell Danelle ORN, MD as PCP - Cardiology (Cardiology) Waddell Danelle ORN, MD as PCP - Electrophysiology (Cardiology) Waddell Danelle ORN, MD as Consulting Physician (Cardiology) Fate Morna SAILOR, [facility] (Inactive) as Pharmacist (Pharmacist)   Review of Systems  All other systems reviewed and are negative.     Objective:    BP 116/74  Pulse 78   Ht 6' 2.5 (1.892 m)   Wt (!) 361 lb (163.7 kg)   SpO2 97%   BMI 45.73 kg/m     Physical Exam Vitals and nursing note reviewed.  Constitutional:      Appearance: Normal appearance. He is obese.  HENT:     Head: Normocephalic.     Right Ear: External ear normal.     Left Ear: External ear normal.  Eyes:     Conjunctiva/sclera: Conjunctivae normal.  Cardiovascular:     Rate and Rhythm: Normal rate.  Pulmonary:     Effort: Pulmonary effort is normal. No respiratory distress.  Abdominal:     Palpations: Abdomen is soft.  Musculoskeletal:        General: Normal range of motion.  Skin:    General: Skin is warm.  Neurological:     Mental Status: He is alert and oriented to person, place, and  time.  Psychiatric:        Mood and Affect: Mood normal.     Physical Exam VITALS: BP- 116/74    No results found for any visits on 12/17/23.       Assessment & Plan:    Routine Health Maintenance and Physical Exam Immunization History  Administered Date(s) Administered   Influenza Whole 10/16/2007, 10/14/2009   Influenza, Seasonal, Injecte, Preservative Fre 02/16/2012, 10/08/2023   Influenza,inj,Quad PF,6+ Mos 11/26/2012, 11/11/2013, 02/04/2015, 02/23/2016, 01/19/2017, 10/30/2017, 09/12/2018, 09/25/2019, 11/18/2020   Moderna SARS-COV2 Booster Vaccination 01/17/2020   PFIZER(Purple Top)SARS-COV-2 Vaccination 03/28/2019, 04/22/2019   Pneumococcal Conjugate-13 02/23/2016   Pneumococcal Polysaccharide-23 10/14/2009   Tdap 05/03/2011, 06/03/2021   Zoster Recombinant(Shingrix) 06/03/2021, 08/15/2021    Health Maintenance  Topic Date Due   HIV Screening  Never done   Diabetic kidney evaluation - Urine ACR  07/24/2010   Pneumococcal Vaccine: 50+ Years (3 of 3 - PCV20 or PCV21) 04/19/2016   OPHTHALMOLOGY EXAM  03/29/2023   FOOT EXAM  07/06/2023   Medicare Annual Wellness (AWV)  07/06/2023   Colonoscopy  07/15/2023   COVID-19 Vaccine (4 - 2025-26 season) 01/01/2025 (Originally 09/17/2023)   HEMOGLOBIN A1C  04/06/2024   Diabetic kidney evaluation - eGFR measurement  10/07/2024   DTaP/Tdap/Td (3 - Td or Tdap) 06/04/2031   Influenza Vaccine  Completed   Hepatitis C Screening  Completed   Zoster Vaccines- Shingrix  Completed   Hepatitis B Vaccines 19-59 Average Risk  Aged Out   HPV VACCINES  Aged Out   Meningococcal B Vaccine  Aged Out    Discussed health benefits of physical activity, and encouraged him to engage in regular exercise appropriate for his age and condition.  Problem List Items Addressed This Visit   None Visit Diagnoses       Annual physical exam    -  Primary       Assessment and Plan Assessment & Plan Adult Wellness Visit Routine adult wellness visit  with emphasis on preventive care. - Administered pneumonia vaccine (Prevnar). - Scheduled colonoscopy with [place] [facility] - Scheduled diabetes follow-up appointment for January 12th.  Type 2 diabetes mellitus with diabetic neuropathy Type 2 diabetes with diabetic neuropathy. Currently on Ozempic  2 mg weekly. Discussed potential switch to Mounjaro  for weight loss benefits. No history of pancreatitis or thyroid  cancer. Side effects of Mounjaro  include nausea, vomiting, diarrhea, constipation, indigestion, stomach pain, and potential thyroid  cancer risk in rodents. Insurance coverage for Mounjaro  to be checked. - Continue Ozempic  2 mg weekly. - Check  insurance coverage for Mounjaro . -  Scheduled diabetes follow-up appointment for January 12th.  Chronic kidney disease, stage 3 Chronic kidney disease stage 3, likely secondary to hypertension and diabetes. Slight bump in kidney function noted. - Will monitor kidney function during next month's A1c check.  Essential hypertension Blood pressure well-controlled at 116/74 mmHg.  Right knee pain, chronic Chronic right knee pain, likely due to arthritis. Weight loss may alleviate symptoms. - Encouraged weight loss and low-impact exercises like aquatics.  Colonic polyps and colon cancer, history of History of colonic polyps and colon cancer. Last colonoscopy in 2020. Overdue for follow-up colonoscopy. - Wants to get it done in [place], new referral placed.    No follow-ups on file.    Vinary K Tylor Courtwright, MD [facility] at [facility]

## 2023-12-18 ENCOUNTER — Telehealth: Payer: Self-pay | Admitting: Pharmacy Technician

## 2023-12-18 ENCOUNTER — Other Ambulatory Visit (HOSPITAL_COMMUNITY): Payer: Self-pay

## 2023-12-18 NOTE — Telephone Encounter (Signed)
 Pharmacy Patient Advocate Encounter  Received notification from [employer] that Prior Authorization for Mounjaro 2.5MG /0.5ML auto-injectors has been APPROVED from 01/17/23 to 01/15/25. Ran test claim, Copay is $0.00. This test claim was processed through [facility]- copay amounts may vary at other pharmacies due to pharmacy/plan contracts, or as the patient moves through the different stages of their insurance plan.   PA #/Case ID/Reference #: [id number]

## 2023-12-18 NOTE — Telephone Encounter (Signed)
 Pharmacy Patient Advocate Encounter   Received notification from CoverMyMeds that prior authorization for Mounjaro 2.5MG /0.5ML auto-injectors is required/requested.   Insurance verification completed.   The patient is insured through [employer].   Per test claim: PA required; PA started via CoverMyMeds. KEY [id number] . Waiting for clinical questions to populate.

## 2023-12-18 NOTE — Telephone Encounter (Signed)
 Please review PA outcome.

## 2024-01-02 ENCOUNTER — Telehealth (HOSPITAL_BASED_OUTPATIENT_CLINIC_OR_DEPARTMENT_OTHER): Payer: Self-pay

## 2024-01-02 ENCOUNTER — Telehealth: Payer: Self-pay

## 2024-01-02 ENCOUNTER — Other Ambulatory Visit: Payer: Self-pay

## 2024-01-02 DIAGNOSIS — Z860100 Personal history of colon polyps, unspecified: Secondary | ICD-10-CM

## 2024-01-02 MED ORDER — PEG 3350-KCL-NA BICARB-NACL 420 G PO SOLR: ORAL | 0 refills | Status: DC

## 2024-01-02 NOTE — Telephone Encounter (Signed)
 Gastroenterology Pre-Procedure Review  Request Date: 02/11/2024 Requesting Physician: Dr. Melany  PATIENT REVIEW QUESTIONS: The patient responded to the following health history questions as indicated:    1. Are you having any GI issues? no 2. Do you have a personal history of Polyps? yes (polyps Dr. Avram 02/25/2013) 3. Do you have a family history of Colon Cancer or Polyps? no 4. Diabetes Mellitus? Yes   Mounjaro  7 days hold 5. Joint replacements in the past 12 months?no 6. Major health problems in the past 3 months?no 7. Any artificial heart valves, MVP, or defibrillator?yes (defibrillator HAS BEEN TURNED OFF-Dr. Cathlyn Birmingham)    MEDICATIONS & ALLERGIES:    Patient reports the following regarding taking any anticoagulation/antiplatelet therapy:   Plavix, Coumadin , Eliquis, Xarelto , Lovenox, Pradaxa, Brilinta, or Effient? yes (Xarelto -Dr.  DOCIA) Aspirin? no  Patient confirms/reports the following medications:  Current Outpatient Medications  Medication Sig Dispense Refill   acetaminophen  (TYLENOL ) 500 MG tablet Take 1,000 mg by mouth every 6 (six) hours as needed (pain).     amiodarone  (PACERONE ) 200 MG tablet Take 1 tablet (200 mg total) by mouth daily. 90 tablet 3   atorvastatin  (LIPITOR) 10 MG tablet Take 1 tablet (10 mg total) by mouth daily. 90 tablet 3   Blood Pressure Monitor DEVI      diltiazem  (DILT-XR) 240 MG 24 hr capsule Take 1 capsule (240 mg total) by mouth every morning. 90 capsule 3   furosemide  (LASIX ) 40 MG tablet Take 1 tablet (40 mg total) by mouth daily. 90 tablet 3   glucose blood test strip Use to check blood sugar once daily 100 each 12   Lancets (ONETOUCH DELICA PLUS LANCET33G) MISC 1 each by Does not apply route daily. 100 each 3   lisinopril  (ZESTRIL ) 40 MG tablet Take 1 tablet (40 mg total) by mouth daily. 90 tablet 3   metoprolol  succinate (TOPROL -XL) 100 MG 24 hr tablet Take 2 tablets (200 mg total) by mouth daily. 180 tablet 3   Multiple  Vitamins-Minerals (BARIATRIC MULTIVITAMINS/IRON PO) Take 1 tablet by mouth in the morning.     rivaroxaban  (XARELTO ) 20 MG TABS tablet TAKE 1 TABLET EVERY DAY WITH SUPPER 90 tablet 3   Semaglutide , 2 MG/DOSE, (OZEMPIC , 2 MG/DOSE,) 8 MG/3ML SOPN Inject 2 mg into the skin once a week. 9 mL 3   tirzepatide  (MOUNJARO ) 2.5 MG/0.5ML Pen Inject 2.5 mg into the skin once a week. 2 mL 0   No current facility-administered medications for this visit.    Patient confirms/reports the following allergies:  Allergies[1]  No orders of the defined types were placed in this encounter.   AUTHORIZATION INFORMATION Primary Insurance: 1D#: Group #:  Secondary Insurance: 1D#: Group #:  SCHEDULE INFORMATION: Date: 04/08/2024 Time: Location: [facility]  Dr. Jinny     [1] No Known Allergies

## 2024-01-02 NOTE — Telephone Encounter (Signed)
 Cardiac clearance and blood thinner hold was requested. Awaiting on response.

## 2024-01-02 NOTE — Telephone Encounter (Signed)
° °  Pre-operative Risk Assessment    Patient Name: Craig Lindsey  DOB: 15-Jun-1960 MRN: [id number]   Date of last office visit: 09/10/23 with Dr. Waddell  Date of next office visit: NA  Request for Surgical Clearance    Procedure:  Colonoscopy  Date of Surgery:  Clearance TBD                                 Surgeon:  NA Surgeon's Group or Practice Name:  [facility]  Phone number:  [phone] Fax number:  [phone]   Type of Clearance Requested:   - Medical  - Pharmacy:  Hold Rivaroxaban  (Xarelto ) not indicated    Type of Anesthesia:  General    Additional requests/questions:    Bonney Augustin JONETTA Delores   01/02/2024, 5:05 PM

## 2024-01-03 NOTE — Progress Notes (Signed)
 PERIOPERATIVE PRESCRIPTION FOR IMPLANTED CARDIAC DEVICE PROGRAMMING  Patient Information: Name:  Craig Lindsey  DOB:  August 22, 1960  MRN:  [id number]   Date of last office visit: 09/10/23 with Dr. Waddell  Date of next office visit: NA   Request for Surgical Clearance    Procedure:  Colonoscopy   Date of Surgery:  Clearance TBD                                  Surgeon:  NA Surgeon's Group or Practice Name:  [facility]  Phone number:  [phone] Fax number:  [phone]   Type of Clearance Requested:   - Medical  - Pharmacy:  Hold Rivaroxaban  (Xarelto ) not indicated      Device Information:  Clinic EP Physician:  Danelle Waddell, MD   Device Type:  Defibrillator Manufacturer and Phone #:  [employer]: [id number] Pacemaker Dependent?:  No. Date of Last Device Check:  09/10/23 IN OFFICE VISIT Normal Device Function?:  No.  Electrophysiologist's Recommendations: NOTE: At 09/10/23 OV with Dr. Waddell:  Patient's device malfunctioned and patient requested to turn the device off without plans to replace.  Device has been programmed in ODO mode with detection and tachy therapies turned off.  Nothing needed for device during procedure.    Per Device Clinic Standing Orders, Alan JAYSON Fees, RN  5:47 PM 01/03/2024

## 2024-01-03 NOTE — Telephone Encounter (Signed)
 Pharmacy please advise on holding xarelto  prior to colonoscopy scheduled for TBD. Thank you.   Last labs: 09/2023

## 2024-01-03 NOTE — Telephone Encounter (Signed)
 Note: patient had malfunction with ICD device.  On last OV with EP on 09/10/23, ICD was programmed off.  ODO mode with detection and therapies OFF.  Patient chose not to have extraction or revision but just turn off.    Will note that in clearance request.  See separate preop clearance encounter.

## 2024-01-04 ENCOUNTER — Other Ambulatory Visit: Payer: Self-pay

## 2024-01-04 MED ORDER — PEG 3350-KCL-NA BICARB-NACL 420 G PO SOLR: 4000.0000 mL | Freq: Once | ORAL | 0 refills | Status: AC

## 2024-01-04 NOTE — Telephone Encounter (Signed)
" ° °  Name: Craig Lindsey  DOB: 1960-07-01  MRN: [id number]  Primary Cardiologist: Danelle Birmingham, MD  Chart reviewed as part of pre-operative protocol coverage. Because of Craig Lindsey's past medical history and time since last visit, he will require a follow-up telephone visit in order to better assess preoperative cardiovascular risk.  Pre-op covering staff: - Please schedule appointment and call patient to inform them. If patient already had an upcoming appointment within acceptable timeframe, please add pre-op clearance to the appointment notes so provider is aware. - Please contact requesting surgeon's office via preferred method (i.e, phone, fax) to inform them of need for appointment prior to surgery.  Per office protocol, patient can hold Xarelto  for 2 days prior to procedure.  He can resume when medically safe to do so.   [name], PA-C  01/04/2024, 10:52 AM   "

## 2024-01-04 NOTE — Telephone Encounter (Signed)
 1st attempt to reach pt regarding surgical clearance and the need for an TELE appointment.  Left pt a detailed message to call back and get that scheduled.

## 2024-01-04 NOTE — Telephone Encounter (Signed)
 Cardiac clearance was received from cardiologist office and they stated that the patient can hold his Xarelto  2 days prior to his procedure and to resume right after it is safe to do so per the physician. Patient was notified and understood to hold his Xarelto  and to contact the cardiologist office to set-up a telephone visit. Patient understood.

## 2024-01-04 NOTE — Telephone Encounter (Signed)
 Patient with diagnosis of A Fib on Xarelto  for anticoagulation.    Procedure: colonoscopy Date of procedure: TBD   CHA2DS2-VASc Score = 3  This indicates a 3.2% annual risk of stroke. The patient's score is based upon: CHF History: 1 HTN History: 1 Diabetes History: 1 Stroke History: 0 Vascular Disease History: 0 Age Score: 0 Gender Score: 0     CrCl 95 ml/min Platelet count 165K  Patient has not  had an Afib/aflutter ablation in the last 3 months, DCCV within the last 4 weeks or a watchman implanted in the last 45 days   Per office protocol, patient can hold Xarelto  for 2 days prior to procedure.    **This guidance is not considered finalized until pre-operative APP has relayed final recommendations.**

## 2024-01-07 NOTE — Telephone Encounter (Signed)
 2nd attempt left message for pt to call preop clearance for TELE vs.

## 2024-01-08 ENCOUNTER — Telehealth: Payer: Self-pay

## 2024-01-08 NOTE — Telephone Encounter (Signed)
"  °  Patient Consent for Virtual Visit        Craig Lindsey has provided verbal consent on 01/08/2024 for a virtual visit (video or telephone).   CONSENT FOR VIRTUAL VISIT FOR:  Craig Lindsey  By participating in this virtual visit I agree to the following:  I hereby voluntarily request, consent and authorize [facility] and its employed or contracted physicians, physician assistants, nurse practitioners or other licensed health care professionals (the Practitioner), to provide me with telemedicine health care services (the Services) as deemed necessary by the treating Practitioner. I acknowledge and consent to receive the Services by the Practitioner via telemedicine. I understand that the telemedicine visit will involve communicating with the Practitioner through live audiovisual communication technology and the disclosure of certain medical information by electronic transmission. I acknowledge that I have been given the opportunity to request an in-person assessment or other available alternative prior to the telemedicine visit and am voluntarily participating in the telemedicine visit.  I understand that I have the right to withhold or withdraw my consent to the use of telemedicine in the course of my care at any time, without affecting my right to future care or treatment, and that the Practitioner or I may terminate the telemedicine visit at any time. I understand that I have the right to inspect all information obtained and/or recorded in the course of the telemedicine visit and may receive copies of available information for a reasonable fee.  I understand that some of the potential risks of receiving the Services via telemedicine include:  Delay or interruption in medical evaluation due to technological equipment failure or disruption; Information transmitted may not be sufficient (e.g. poor resolution of images) to allow for appropriate medical decision making by the  Practitioner; and/or  In rare instances, security protocols could fail, causing a breach of personal health information.  Furthermore, I acknowledge that it is my responsibility to provide information about my medical history, conditions and care that is complete and accurate to the best of my ability. I acknowledge that Practitioner's advice, recommendations, and/or decision may be based on factors not within their control, such as incomplete or inaccurate data provided by me or distortions of diagnostic images or specimens that may result from electronic transmissions. I understand that the practice of medicine is not an exact science and that Practitioner makes no warranties or guarantees regarding treatment outcomes. I acknowledge that a copy of this consent can be made available to me via my patient portal [facility] MyChart), or I can request a printed copy by calling the office of [facility].    I understand that my insurance will be billed for this visit.   I have read or had this consent read to me. I understand the contents of this consent, which adequately explains the benefits and risks of the Services being provided via telemedicine.  I have been provided ample opportunity to ask questions regarding this consent and the Services and have had my questions answered to my satisfaction. I give my informed consent for the services to be provided through the use of telemedicine in my medical care    "

## 2024-01-08 NOTE — Telephone Encounter (Signed)
 Preop tele appt now scheduled, med rec and consent done.

## 2024-01-28 ENCOUNTER — Encounter: Payer: Self-pay | Admitting: Family Medicine

## 2024-01-28 ENCOUNTER — Other Ambulatory Visit: Payer: Self-pay | Admitting: Family Medicine

## 2024-01-28 ENCOUNTER — Ambulatory Visit (INDEPENDENT_AMBULATORY_CARE_PROVIDER_SITE_OTHER): Admitting: Family Medicine

## 2024-01-28 VITALS — BP 132/78 | HR 114 | Ht 74.5 in | Wt 364.0 lb

## 2024-01-28 DIAGNOSIS — I4891 Unspecified atrial fibrillation: Secondary | ICD-10-CM

## 2024-01-28 DIAGNOSIS — Z7985 Long-term (current) use of injectable non-insulin antidiabetic drugs: Secondary | ICD-10-CM

## 2024-01-28 DIAGNOSIS — N1832 Chronic kidney disease, stage 3b: Secondary | ICD-10-CM | POA: Diagnosis not present

## 2024-01-28 DIAGNOSIS — E1140 Type 2 diabetes mellitus with diabetic neuropathy, unspecified: Secondary | ICD-10-CM

## 2024-01-28 DIAGNOSIS — D693 Immune thrombocytopenic purpura: Secondary | ICD-10-CM | POA: Diagnosis not present

## 2024-01-28 DIAGNOSIS — Z6841 Body Mass Index (BMI) 40.0 and over, adult: Secondary | ICD-10-CM

## 2024-01-28 DIAGNOSIS — G4733 Obstructive sleep apnea (adult) (pediatric): Secondary | ICD-10-CM

## 2024-01-28 DIAGNOSIS — Z95811 Presence of heart assist device: Secondary | ICD-10-CM

## 2024-01-28 DIAGNOSIS — E119 Type 2 diabetes mellitus without complications: Secondary | ICD-10-CM

## 2024-01-28 LAB — POCT GLYCOSYLATED HEMOGLOBIN (HGB A1C): Hemoglobin A1C: 6.6 % — AB (ref 4.0–5.6)

## 2024-01-28 MED ORDER — TIRZEPATIDE 5 MG/0.5ML ~~LOC~~ SOAJ: 5.0000 mg | SUBCUTANEOUS | 0 refills | Status: AC

## 2024-01-28 MED ORDER — TIRZEPATIDE 7.5 MG/0.5ML ~~LOC~~ SOAJ: 7.5000 mg | SUBCUTANEOUS | 1 refills | Status: AC

## 2024-01-28 NOTE — Progress Notes (Signed)
 "  Established Patient Office Visit  Patient ID: Craig Lindsey, male    DOB: November 15, 1960  Age: 64 y.o. MRN: [id number] PCP: Disa Riedlinger K, MD  Chief Complaint  Patient presents with   Diabetes    Subjective:     HPI  Discussed the use of AI scribe software for clinical note transcription with the patient, who gave verbal consent to proceed.  History of Present Illness Craig Lindsey is a 64 year old male with type 2 diabetes and atrial fibrillation who presents for medication management and follow-up.  He started Mounjaro  on December 1st after previously being on Ozempic . He ran out of Mounjaro  about a week ago due to the high cost of $350 for a one-month supply through his mail order pharmacy, [facility]. He tolerates Mounjaro  well without any side effects. His recent A1c result was 6.6%. He has not been checking his blood sugars regularly but plans to resume monitoring with new strips.  He has a history of atrial fibrillation and has a defibrillator, which is currently turned off. No palpitations or sensations related to AFib. His current medications for AFib include Pacerone  and diltiazem . He also takes Lasix , lisinopril , metoprolol , and Xarelto  for heart and blood pressure management.  He uses a CPAP machine for sleep apnea and reports it is working well. No issues with vision, having last seen an eye doctor in September. He denies any foot problems, including ulcers or tingling sensations.  He has not seen a cardiologist recently since his previous cardiologist retired. He plans to address this when he has an upcoming online appointment related to his colonoscopy scheduled for March 24th.     Review of Systems  All other systems reviewed and are negative.     Objective:     BP 132/78   Pulse (!) 114   Ht 6' 2.5 (1.892 m)   Wt (!) 364 lb (165.1 kg)   SpO2 99%   BMI 46.11 kg/m  BP Readings from Last 3 Encounters:  01/28/24 132/78  12/17/23 116/74   10/08/23 118/74   Wt Readings from Last 3 Encounters:  01/28/24 (!) 364 lb (165.1 kg)  12/17/23 (!) 361 lb (163.7 kg)  10/08/23 (!) 363 lb (164.7 kg)      Physical Exam Vitals and nursing note reviewed.  Constitutional:      Appearance: Normal appearance.  HENT:     Head: Normocephalic.     Right Ear: External ear normal.     Left Ear: External ear normal.  Eyes:     Conjunctiva/sclera: Conjunctivae normal.  Cardiovascular:     Rate and Rhythm: Normal rate.  Pulmonary:     Effort: Pulmonary effort is normal. No respiratory distress.  Abdominal:     Palpations: Abdomen is soft.  Musculoskeletal:        General: Normal range of motion.  Skin:    General: Skin is warm.  Neurological:     Mental Status: He is alert and oriented to person, place, and time.  Psychiatric:        Mood and Affect: Mood normal.     Physical Exam     Results for orders placed or performed in visit on 01/28/24  POCT glycosylated hemoglobin (Hb A1C)  Result Value Ref Range   Hemoglobin A1C 6.6 (A) 4.0 - 5.6 %   HbA1c POC (<> result, manual entry)     HbA1c, POC (prediabetic range)     HbA1c, POC (controlled diabetic range)  The ASCVD Risk score (Arnett DK, et al., 2019) failed to calculate for the following reasons:   The valid total cholesterol range is 130 to 320 mg/dL    Assessment & Plan:   Problem List Items Addressed This Visit       Cardiovascular and Mediastinum   Atrial fibrillation (HCC)     Respiratory   OSA on CPAP     Endocrine   Type 2 diabetes, controlled, with neuropathy (HCC) - Primary   Relevant Medications   tirzepatide  (MOUNJARO ) 5 MG/0.5ML Pen   tirzepatide  (MOUNJARO ) 7.5 MG/0.5ML Pen   Other Relevant Orders   POCT glycosylated hemoglobin (Hb A1C) (Completed)   Microalbumin / creatinine urine ratio     Musculoskeletal and Integument   Chronic idiopathic thrombocytopenia (HCC)   Relevant Orders   CBC with Differential     Other    Presence of heart assist device (HCC)   Other Visit Diagnoses       Diabetes mellitus treated with injections of non-insulin  medication (HCC)       Relevant Medications   tirzepatide  (MOUNJARO ) 5 MG/0.5ML Pen   tirzepatide  (MOUNJARO ) 7.5 MG/0.5ML Pen     Stage 3b chronic kidney disease (HCC)       Relevant Orders   Comprehensive Metabolic Panel (CMET)     BMI 54.9-50.0, adult (HCC)       Relevant Medications   tirzepatide  (MOUNJARO ) 5 MG/0.5ML Pen   tirzepatide  (MOUNJARO ) 7.5 MG/0.5ML Pen       Assessment and Plan Assessment & Plan Type 2 diabetes mellitus Recent switch to Mounjaro  improved A1c to 6.6%. Requires monitoring for side effects and insurance mandates weight loss progression. - Increased Mounjaro  dose to 5 mg weekly. - Monitor for side effects. - Check blood glucose levels at home, including fasting, bedtime, and postprandial readings. - Scheduled follow-up in four months to assess diabetes management and weight loss progress.  Chronic kidney disease, stage 3b Stage 3b with previous kidney function concerns. - Recheck kidney function with upcoming labs.  Atrial fibrillation Managed with pacerone  and diltiazem . No recent palpitations or arrhythmia symptoms. - Continue current medications: pacerone  and diltiazem . - Follow up with cardiology as needed.  Morbid obesity, BMI 45.0-49.9 Morbid obesity with Mounjaro  prescribed for weight management. Insurance requires weight loss progression. - Continue Mounjaro  for weight management. - Monitor weight loss progress and adjust treatment as needed.  Presence of heart assist device Presence of a defibrillator.  General health maintenance Up to date on vision check. CPAP machine functioning well. Colonoscopy scheduled for March 24th. - Ensure colonoscopy is completed on March 24th. - Continue using CPAP machine.    No follow-ups on file.    Laquasha Groome K Challen Spainhour, MD [facility] at  [facility]   "

## 2024-01-29 ENCOUNTER — Ambulatory Visit: Payer: Self-pay | Admitting: Family Medicine

## 2024-01-29 LAB — COMPREHENSIVE METABOLIC PANEL WITH GFR
ALT: 29 IU/L (ref 0–44)
AST: 22 IU/L (ref 0–40)
Albumin: 4.1 g/dL (ref 3.9–4.9)
Alkaline Phosphatase: 76 IU/L (ref 47–123)
BUN/Creatinine Ratio: 16 (ref 10–24)
BUN: 24 mg/dL (ref 8–27)
Bilirubin Total: 0.7 mg/dL (ref 0.0–1.2)
CO2: 21 mmol/L (ref 20–29)
Calcium: 9 mg/dL (ref 8.6–10.2)
Chloride: 108 mmol/L — ABNORMAL HIGH (ref 96–106)
Creatinine, Ser: 1.46 mg/dL — ABNORMAL HIGH (ref 0.76–1.27)
Globulin, Total: 2.8 g/dL (ref 1.5–4.5)
Glucose: 161 mg/dL — ABNORMAL HIGH (ref 70–99)
Potassium: 4.6 mmol/L (ref 3.5–5.2)
Sodium: 143 mmol/L (ref 134–144)
Total Protein: 6.9 g/dL (ref 6.0–8.5)
eGFR: 54 mL/min/1.73 — ABNORMAL LOW

## 2024-01-29 LAB — CBC WITH DIFFERENTIAL/PLATELET
Basophils Absolute: 0.1 x10E3/uL (ref 0.0–0.2)
Basos: 1 %
EOS (ABSOLUTE): 0.2 x10E3/uL (ref 0.0–0.4)
Eos: 3 %
Hematocrit: 53.4 % — ABNORMAL HIGH (ref 37.5–51.0)
Hemoglobin: 17.1 g/dL (ref 13.0–17.7)
Immature Grans (Abs): 0 x10E3/uL (ref 0.0–0.1)
Immature Granulocytes: 0 %
Lymphocytes Absolute: 1.9 x10E3/uL (ref 0.7–3.1)
Lymphs: 25 %
MCH: 30.9 pg (ref 26.6–33.0)
MCHC: 32 g/dL (ref 31.5–35.7)
MCV: 96 fL (ref 79–97)
Monocytes Absolute: 0.5 x10E3/uL (ref 0.1–0.9)
Monocytes: 7 %
Neutrophils Absolute: 4.6 x10E3/uL (ref 1.4–7.0)
Neutrophils: 63 %
Platelets: 141 x10E3/uL — ABNORMAL LOW (ref 150–450)
RBC: 5.54 x10E6/uL (ref 4.14–5.80)
RDW: 12.7 % (ref 11.6–15.4)
WBC: 7.3 x10E3/uL (ref 3.4–10.8)

## 2024-01-29 LAB — MICROALBUMIN / CREATININE URINE RATIO
Creatinine, Urine: 210 mg/dL
Microalb/Creat Ratio: 454 mg/g{creat} — ABNORMAL HIGH (ref 0–29)
Microalbumin, Urine: 952.6 ug/mL

## 2024-01-29 NOTE — Telephone Encounter (Signed)
 Please review medication refill request

## 2024-01-29 NOTE — Telephone Encounter (Signed)
 Requested medication (s) are due for refill today - no  Requested medication (s) are on the active medication list -yes  Future visit scheduled -yes  Last refill: 01/28/24 2ml 1RF  Notes to clinic: off protocol- provider review , too soon  Requested Prescriptions  Pending Prescriptions Disp Refills   MOUNJARO  2.5 MG/0.5ML Pen [Pharmacy Med Name: MOUNJARO  2.5 MG/0.5ML Subcutaneous Solution Auto-injector]  11    Sig: INJECT 2.5 MG INTO THE SKIN ONCE A WEEK.     Off-Protocol Failed - 01/29/2024  1:16 PM      Failed - Medication not assigned to a protocol, review manually.      Passed - Valid encounter within last 12 months    Recent Outpatient Visits           Yesterday Type 2 diabetes, controlled, with neuropathy (HCC)   [facility] at [facility] K[place], Craig K, MD   1 month ago Annual physical exam   [facility] at [facility], Craig K, MD   3 months ago Type 2 diabetes, controlled, with neuropathy [facility])   [place] [facility] at [facility], Craig K, MD                 Requested Prescriptions  Pending Prescriptions Disp Refills   MOUNJARO  2.5 MG/0.5ML Pen [Pharmacy Med Name: MOUNJARO  2.5 MG/0.5ML Subcutaneous Solution Auto-injector]  11    Sig: INJECT 2.5 MG INTO THE SKIN ONCE A WEEK.     Off-Protocol Failed - 01/29/2024  1:16 PM      Failed - Medication not assigned to a protocol, review manually.      Passed - Valid encounter within last 12 months    Recent Outpatient Visits           Yesterday Type 2 diabetes, controlled, with neuropathy (HCC)   [place] [facility] at [facility] Kotturi, Craig K, MD   1 month ago Annual physical exam   [facility] at [facility], Craig K, MD   3 months ago Type 2 diabetes, controlled, with neuropathy [facility])   [facility] &  Sports Medicine at [facility], Craig K, MD

## 2024-03-24 ENCOUNTER — Ambulatory Visit

## 2024-04-08 ENCOUNTER — Ambulatory Visit: Admit: 2024-04-08 | Admitting: Gastroenterology

## 2024-05-28 ENCOUNTER — Ambulatory Visit: Admitting: Family Medicine
